# Patient Record
Sex: Male | Born: 1965
Health system: Southern US, Community
[De-identification: ages and names within clinical notes are randomized; demographics above are authoritative.]

## PROBLEM LIST (undated history)

## (undated) DIAGNOSIS — F32A Depression, unspecified: Secondary | ICD-10-CM

## (undated) DIAGNOSIS — E785 Hyperlipidemia, unspecified: Secondary | ICD-10-CM

## (undated) DIAGNOSIS — G4730 Sleep apnea, unspecified: Secondary | ICD-10-CM

## (undated) DIAGNOSIS — E559 Vitamin D deficiency, unspecified: Secondary | ICD-10-CM

## (undated) DIAGNOSIS — I10 Essential (primary) hypertension: Secondary | ICD-10-CM

## (undated) DIAGNOSIS — F1010 Alcohol abuse, uncomplicated: Secondary | ICD-10-CM

## (undated) DIAGNOSIS — S82842A Displaced bimalleolar fracture of left lower leg, initial encounter for closed fracture: Secondary | ICD-10-CM

## (undated) DIAGNOSIS — Z720 Tobacco use: Secondary | ICD-10-CM

## (undated) DIAGNOSIS — I341 Nonrheumatic mitral (valve) prolapse: Secondary | ICD-10-CM

## (undated) DIAGNOSIS — R011 Cardiac murmur, unspecified: Secondary | ICD-10-CM

## (undated) DIAGNOSIS — I340 Nonrheumatic mitral (valve) insufficiency: Secondary | ICD-10-CM

## (undated) DIAGNOSIS — F329 Major depressive disorder, single episode, unspecified: Secondary | ICD-10-CM

## (undated) DIAGNOSIS — F419 Anxiety disorder, unspecified: Secondary | ICD-10-CM

## (undated) HISTORY — DX: Alcohol abuse, uncomplicated: F10.10

## (undated) HISTORY — DX: Depression, unspecified: F32.A

## (undated) HISTORY — DX: Cardiac murmur, unspecified: R01.1

## (undated) HISTORY — DX: Nonrheumatic mitral (valve) prolapse: I34.1

## (undated) HISTORY — DX: Vitamin D deficiency, unspecified: E55.9

## (undated) HISTORY — PX: TOOTH EXTRACTION: SUR596

## (undated) HISTORY — DX: Hyperlipidemia, unspecified: E78.5

## (undated) HISTORY — DX: Major depressive disorder, single episode, unspecified: F32.9

## (undated) HISTORY — DX: Anxiety disorder, unspecified: F41.9

## (undated) HISTORY — DX: Essential (primary) hypertension: I10

## (undated) HISTORY — DX: Tobacco use: Z72.0

## (undated) HISTORY — DX: Sleep apnea, unspecified: G47.30

## (undated) HISTORY — DX: Nonrheumatic mitral (valve) insufficiency: I34.0

---

## 2007-10-23 ENCOUNTER — Ambulatory Visit: Payer: Self-pay | Admitting: Family Medicine

## 2007-10-30 ENCOUNTER — Ambulatory Visit: Payer: Self-pay | Admitting: Family Medicine

## 2009-06-04 ENCOUNTER — Ambulatory Visit: Payer: Self-pay | Admitting: Internal Medicine

## 2009-06-10 LAB — CBC WITH DIFFERENTIAL/PLATELET
BASO%: 0.4 % (ref 0.0–2.0)
EOS%: 1.3 % (ref 0.0–7.0)
MCH: 33.1 pg (ref 27.2–33.4)
MCHC: 35.2 g/dL (ref 32.0–36.0)
NEUT%: 62 % (ref 39.0–75.0)
RDW: 11.9 % (ref 11.0–14.6)
lymph#: 2.1 10*3/uL (ref 0.9–3.3)

## 2009-06-11 LAB — COMPREHENSIVE METABOLIC PANEL
ALT: 39 U/L (ref 0–53)
AST: 27 U/L (ref 0–37)
Calcium: 9.3 mg/dL (ref 8.4–10.5)
Chloride: 105 mEq/L (ref 96–112)
Creatinine, Ser: 1.17 mg/dL (ref 0.40–1.50)
Potassium: 4.1 mEq/L (ref 3.5–5.3)

## 2009-06-30 LAB — CBC WITH DIFFERENTIAL/PLATELET
BASO%: 0.3 % (ref 0.0–2.0)
EOS%: 2 % (ref 0.0–7.0)
LYMPH%: 20.5 % (ref 14.0–49.0)
MCHC: 35.4 g/dL (ref 32.0–36.0)
MCV: 94.3 fL (ref 79.3–98.0)
MONO%: 5.9 % (ref 0.0–14.0)
Platelets: 193 10*3/uL (ref 140–400)
RBC: 5.23 10*6/uL (ref 4.20–5.82)
WBC: 8.3 10*3/uL (ref 4.0–10.3)

## 2009-06-30 LAB — LACTATE DEHYDROGENASE: LDH: 153 U/L (ref 94–250)

## 2009-07-28 ENCOUNTER — Ambulatory Visit: Payer: Self-pay | Admitting: Internal Medicine

## 2009-07-29 LAB — CBC WITH DIFFERENTIAL/PLATELET
Basophils Absolute: 0 10*3/uL (ref 0.0–0.1)
EOS%: 2.9 % (ref 0.0–7.0)
HCT: 48.9 % (ref 38.4–49.9)
HGB: 17.5 g/dL — ABNORMAL HIGH (ref 13.0–17.1)
MCH: 33.2 pg (ref 27.2–33.4)
MONO#: 0.4 10*3/uL (ref 0.1–0.9)
NEUT%: 54.6 % (ref 39.0–75.0)
lymph#: 1.7 10*3/uL (ref 0.9–3.3)

## 2009-08-26 LAB — CBC WITH DIFFERENTIAL/PLATELET
Eosinophils Absolute: 0.2 10*3/uL (ref 0.0–0.5)
HCT: 48.4 % (ref 38.4–49.9)
HGB: 17.4 g/dL — ABNORMAL HIGH (ref 13.0–17.1)
LYMPH%: 24.9 % (ref 14.0–49.0)
MONO#: 0.4 10*3/uL (ref 0.1–0.9)
NEUT#: 4.2 10*3/uL (ref 1.5–6.5)
NEUT%: 66.3 % (ref 39.0–75.0)
Platelets: 184 10*3/uL (ref 140–400)
RBC: 5.21 10*6/uL (ref 4.20–5.82)
WBC: 6.4 10*3/uL (ref 4.0–10.3)

## 2009-08-26 LAB — LACTATE DEHYDROGENASE: LDH: 160 U/L (ref 94–250)

## 2009-09-22 ENCOUNTER — Ambulatory Visit: Payer: Self-pay | Admitting: Internal Medicine

## 2009-09-24 LAB — CBC WITH DIFFERENTIAL/PLATELET
Eosinophils Absolute: 0.2 10*3/uL (ref 0.0–0.5)
MONO#: 0.5 10*3/uL (ref 0.1–0.9)
NEUT#: 2.5 10*3/uL (ref 1.5–6.5)
RBC: 5.31 10*6/uL (ref 4.20–5.82)
RDW: 12.1 % (ref 11.0–14.6)
WBC: 5 10*3/uL (ref 4.0–10.3)
lymph#: 1.8 10*3/uL (ref 0.9–3.3)

## 2009-10-23 ENCOUNTER — Ambulatory Visit: Payer: Self-pay | Admitting: Internal Medicine

## 2009-10-27 LAB — CBC WITH DIFFERENTIAL/PLATELET
EOS%: 4.5 % (ref 0.0–7.0)
MCH: 32.2 pg (ref 27.2–33.4)
MCV: 91.6 fL (ref 79.3–98.0)
MONO%: 8.8 % (ref 0.0–14.0)
NEUT#: 2.1 10*3/uL (ref 1.5–6.5)
RBC: 5.15 10*6/uL (ref 4.20–5.82)
RDW: 11.9 % (ref 11.0–14.6)
lymph#: 1.8 10*3/uL (ref 0.9–3.3)

## 2009-11-26 ENCOUNTER — Ambulatory Visit: Payer: Self-pay | Admitting: Internal Medicine

## 2009-12-01 LAB — CBC WITH DIFFERENTIAL/PLATELET
BASO%: 0.3 % (ref 0.0–2.0)
Basophils Absolute: 0 10*3/uL (ref 0.0–0.1)
EOS%: 2.4 % (ref 0.0–7.0)
HGB: 16.1 g/dL (ref 13.0–17.1)
MCH: 31 pg (ref 27.2–33.4)
MCHC: 34.2 g/dL (ref 32.0–36.0)
MCV: 90.7 fL (ref 79.3–98.0)
MONO%: 4.1 % (ref 0.0–14.0)
NEUT%: 68.2 % (ref 39.0–75.0)
RDW: 12.1 % (ref 11.0–14.6)

## 2010-01-29 ENCOUNTER — Ambulatory Visit: Payer: Self-pay | Admitting: Internal Medicine

## 2010-02-02 LAB — CBC WITH DIFFERENTIAL/PLATELET
BASO%: 0.6 % (ref 0.0–2.0)
Basophils Absolute: 0 10*3/uL (ref 0.0–0.1)
Eosinophils Absolute: 0.2 10*3/uL (ref 0.0–0.5)
HCT: 42 % (ref 38.4–49.9)
HGB: 14.6 g/dL (ref 13.0–17.1)
LYMPH%: 33.5 % (ref 14.0–49.0)
MCHC: 34.8 g/dL (ref 32.0–36.0)
MONO#: 0.4 10*3/uL (ref 0.1–0.9)
NEUT%: 54.2 % (ref 39.0–75.0)
Platelets: 208 10*3/uL (ref 140–400)
WBC: 4.9 10*3/uL (ref 4.0–10.3)

## 2010-05-05 ENCOUNTER — Other Ambulatory Visit: Payer: Self-pay | Admitting: Internal Medicine

## 2010-05-05 ENCOUNTER — Encounter (HOSPITAL_BASED_OUTPATIENT_CLINIC_OR_DEPARTMENT_OTHER): Payer: MEDICARE | Admitting: Internal Medicine

## 2010-05-05 DIAGNOSIS — D751 Secondary polycythemia: Secondary | ICD-10-CM

## 2010-05-05 LAB — CBC WITH DIFFERENTIAL/PLATELET
BASO%: 0.7 % (ref 0.0–2.0)
EOS%: 3.5 % (ref 0.0–7.0)
MCH: 30.4 pg (ref 27.2–33.4)
MCHC: 33.9 g/dL (ref 32.0–36.0)
MONO#: 0.4 10*3/uL (ref 0.1–0.9)
RBC: 5 10*6/uL (ref 4.20–5.82)
RDW: 13.1 % (ref 11.0–14.6)
WBC: 4.7 10*3/uL (ref 4.0–10.3)
lymph#: 1.8 10*3/uL (ref 0.9–3.3)

## 2010-05-05 LAB — COMPREHENSIVE METABOLIC PANEL
ALT: 37 U/L (ref 0–53)
AST: 26 U/L (ref 0–37)
CO2: 26 mEq/L (ref 19–32)
Calcium: 9.5 mg/dL (ref 8.4–10.5)
Chloride: 107 mEq/L (ref 96–112)
Creatinine, Ser: 1.03 mg/dL (ref 0.40–1.50)
Potassium: 4.3 mEq/L (ref 3.5–5.3)
Sodium: 142 mEq/L (ref 135–145)
Total Protein: 6.7 g/dL (ref 6.0–8.3)

## 2010-05-05 LAB — LACTATE DEHYDROGENASE: LDH: 145 U/L (ref 94–250)

## 2012-10-22 ENCOUNTER — Telehealth: Payer: Self-pay | Admitting: Family Medicine

## 2012-10-22 NOTE — Telephone Encounter (Signed)
PT INFORMED

## 2012-10-22 NOTE — Telephone Encounter (Signed)
PT CALLED STATING HE WAS A FORMER PT OF YOURS. FROM WHAT I CAN TELL IT HAS BEEN SINCE BEFORE 2009 SINCE WE HAVE SEEN HIM. HE WOULD LIKE TO RETURN HE NOW HAS MEDICARE [employer] INS.    PLEASE ADVISE.

## 2012-10-22 NOTE — Telephone Encounter (Signed)
Tell him that we are not accepting any new Medicare patients

## 2012-10-29 ENCOUNTER — Ambulatory Visit
Admission: RE | Admit: 2012-10-29 | Discharge: 2012-10-29 | Disposition: A | Discharge status: Home / Self Care | Payer: Medicare Other | Source: Ambulatory Visit | Attending: Family Medicine | Admitting: Family Medicine

## 2012-10-29 ENCOUNTER — Other Ambulatory Visit: Payer: Self-pay | Admitting: Family Medicine

## 2012-10-29 DIAGNOSIS — R202 Paresthesia of skin: Secondary | ICD-10-CM

## 2012-10-29 DIAGNOSIS — R05 Cough: Secondary | ICD-10-CM

## 2012-11-13 ENCOUNTER — Encounter: Payer: Self-pay | Admitting: Pulmonary Disease

## 2012-11-13 ENCOUNTER — Ambulatory Visit (INDEPENDENT_AMBULATORY_CARE_PROVIDER_SITE_OTHER): Payer: Medicare Other | Admitting: Pulmonary Disease

## 2012-11-13 VITALS — BP 132/90 | HR 87 | Temp 97.1°F | Ht 75.0 in | Wt 206.0 lb

## 2012-11-13 DIAGNOSIS — G4733 Obstructive sleep apnea (adult) (pediatric): Secondary | ICD-10-CM | POA: Insufficient documentation

## 2012-11-13 NOTE — Progress Notes (Signed)
Subjective:    Patient ID: Jonathon Walker, male    DOB: 1965-09-18, 47 y.o.   MRN: [id number]  HPI 47 year old smoker presents for evaluation of obstructive sleep apnea.  He underwent a diagnostic polysomnogram in 07/2006 which showed an AHI of 26 events per hour, REM-related AHI of 20 events per hour with the lowest desaturation of 89%. Supine sleep was noted for 30 minutes with an AHI of 8 events per hour. A subsequent CPAP titration study required a CPAP pressure of 17 cm with C-Flex of 3 to control events. He could not tolerate this level of pressure and seems like he was set up with an auto BiPAP with a fullface mask by [facility]. They seem to work well for a few years. Over the past few months her he reports numerous awakenings and feels like the BiPAP is not ramping up on the pressure in always stays at a level of 6 cm. He reports excessive daytime tiredness and frequent nocturnal awakenings. Epworth sleepiness score is 5/24. Bedtime is around 11 PM, sleep latency is about 30 minutes, he sleeps on his back with one pillow. He reports numerous spontaneous awakenings and is out of bed at 8 AM feeling tired without dryness of mouth or headaches. His weight has not changed in recent years. 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 There is no history suggestive of cataplexy, sleep paralysis or parasomnias   Past Medical History  Diagnosis Date  . Sleep apnea   . Depression   . Anxiety     Past Surgical History  Procedure Laterality Date  . Tooth extraction      No Known Allergies  History   Social History  . Marital Status: Married    Spouse Name: N/A    Number of Children: N/A  . Years of Education: N/A   Occupational History  . Not on file.   Social History Main Topics  . Smoking status: Current Every Day Smoker -- 1.00 packs/day for 3 years    Types: Cigarettes  . Smokeless tobacco: Never Used  . Alcohol Use: Yes  .  Drug Use: No  . Sexual Activity: Not on file   Other Topics Concern  . Not on file   Social History Narrative  . No narrative on file    Family History  Problem Relation Age of Onset  . Cancer Maternal Grandfather       Review of Systems  Constitutional: Negative for fever, chills, diaphoresis, activity change, appetite change, fatigue and unexpected weight change.  HENT: Negative for hearing loss, ear pain, nosebleeds, congestion, sore throat, facial swelling, rhinorrhea, sneezing, mouth sores, trouble swallowing, neck pain, neck stiffness, dental problem, voice change, postnasal drip, sinus pressure, tinnitus and ear discharge.   Eyes: Negative for photophobia, discharge, itching and visual disturbance.  Respiratory: Negative for apnea, cough, choking, chest tightness, shortness of breath, wheezing and stridor.   Cardiovascular: Negative for chest pain, palpitations and leg swelling.  Gastrointestinal: Negative for nausea, vomiting, abdominal pain, constipation, blood in stool and abdominal distention.  Genitourinary: Negative for dysuria, urgency, frequency, hematuria, flank pain, decreased urine volume and difficulty urinating.  Musculoskeletal: Negative for myalgias, back pain, joint swelling, arthralgias and gait problem.  Skin: Negative for color change, pallor and rash.  Neurological: Negative for dizziness, tremors, seizures, syncope, speech difficulty, weakness, light-headedness, numbness and headaches.  Hematological: Negative for adenopathy. Does not bruise/bleed easily.  Psychiatric/Behavioral: Positive for sleep disturbance. Negative for  confusion and agitation. The patient is not nervous/anxious.        Objective:   Physical Exam  Gen. Pleasant, well-nourished, in no distress, normal affect ENT - no lesions, no post nasal drip Neck: No JVD, no thyromegaly, no carotid bruits Lungs: no use of accessory muscles, no dullness to percussion, clear without rales or  rhonchi  Cardiovascular: Rhythm regular, heart sounds  normal, no murmurs or gallops, no peripheral edema Abdomen: soft and non-tender, no hepatosplenomegaly, BS normal. Musculoskeletal: No deformities, no cyanosis or clubbing Neuro:  alert, non focal       Assessment & Plan:

## 2012-11-13 NOTE — Patient Instructions (Addendum)
Current DME - [facility] We will have DMe check out your bipap machine & get [place] download report Based on this ,we will decide about need for repeat bipap titration sleep study

## 2012-11-13 NOTE — Assessment & Plan Note (Addendum)
Moderate, AHI 26/h in 07/2006 corrected by CPAP 17 - maintained on auto bipap Current DME - [facility] We will have DMe check out your bipap machine & get [place] download report Based on this ,we will decide about need for repeat bipap titration sleep study  Weight loss encouraged, compliance with goal of at least 4-6 hrs every night is the expectation. Advised against medications with sedative side effects Cautioned against driving when sleepy - understanding that sleepiness will vary on a day to day basis

## 2012-11-16 ENCOUNTER — Institutional Professional Consult (permissible substitution): Payer: Self-pay | Admitting: Pulmonary Disease

## 2012-11-23 ENCOUNTER — Telehealth: Payer: Self-pay | Admitting: Pulmonary Disease

## 2012-11-23 DIAGNOSIS — G4733 Obstructive sleep apnea (adult) (pediatric): Secondary | ICD-10-CM

## 2012-11-23 NOTE — Telephone Encounter (Signed)
Spoke with Beth at [facility] and she states that download shows that pt's bipap machine is not working properly.  Pt got his machine in 2008 and is eligible for new machine.  Do you want to order new machine for pt?  If so order will need to go to another company [facility]) due to pt having [employer].  Please advise.

## 2012-11-23 NOTE — Telephone Encounter (Signed)
Dr Vassie Loll please advise. Patient needing new CPAP ASAP D/L and e-mail in your look at.

## 2012-11-26 NOTE — Telephone Encounter (Signed)
Reviewed bipap download - avg pr 11/8, with [facility] 6/h, malfunctioning machine OK to send order to [facility] for new Cpap 11cm with EPR 3 cm, humidity , download in 1 month

## 2012-11-26 NOTE — Telephone Encounter (Signed)
I have placed order and sent message to [facility] making her aware. Nothing further needed

## 2012-11-28 ENCOUNTER — Telehealth: Payer: Self-pay | Admitting: *Deleted

## 2012-11-28 NOTE — Telephone Encounter (Signed)
i did place order on 11/26/12 and sent Melissa a staff message letting her know. I called and made Rhonda aware. Will forward message to her

## 2012-11-28 NOTE — Telephone Encounter (Signed)
Message copied by Tommie Sams on Wed Nov 28, 2012 12:27 PM ------      Message from: Alfonso Ramus J      Created: Wed Nov 28, 2012 11:29 AM      Regarding: RE: download from [facility]        When you have time, please come see me about this patient.      Thanks,      Bjorn Loser      ----- Message -----         From: Tommie Sams, CMA         Sent: 11/27/2012   5:23 PM           To: Ollen Gross      Subject: RE: download from [facility] already has this order. thanks      ----- Message -----         From: Ollen Gross         Sent: 11/27/2012   5:12 PM           To: Tommie Sams, CMA      Subject: download from [facility]                                        Pt was in on 11/13/12-Order sent to [facility] to obtain download and check pt's machine.      Download was placed in Dr. Reginia Naas look at stack, which indicated that according to download showed large amounts of days the machine was not coming on. [facility] no longer accepts patient's insurance, therefore we will need a new order.      Pt needs a new DME to supply a replacement and patient wants to go to [facility].       I will need a new dme order for a new machine to go to [facility].      Please advise.      Thanks,      Bjorn Loser             ------

## 2012-11-28 NOTE — Telephone Encounter (Signed)
Pl see my last phone note -start CPAP 11 cm -EPR 3

## 2012-11-28 NOTE — Telephone Encounter (Signed)
Download has been giving to Dr. Vassie Loll to advise what setting his bipap needs to be on or if he needs auto bipap. I will then send order to get pt new machine with setting. Please advise Dr. Vassie Loll thanks

## 2012-11-30 ENCOUNTER — Telehealth: Payer: Self-pay | Admitting: Pulmonary Disease

## 2012-11-30 ENCOUNTER — Institutional Professional Consult (permissible substitution): Payer: Self-pay | Admitting: Pulmonary Disease

## 2012-11-30 NOTE — Telephone Encounter (Signed)
Spoke with Jonathon Walker She has the order and will have her respiratory dept call pt's Mother with update  Will close encounter

## 2012-11-30 NOTE — Telephone Encounter (Signed)
Jonathon Walker returned call . [name]

## 2012-11-30 NOTE — Telephone Encounter (Signed)
Spoke with the pt's mother She states that they never heard back on CPAP download  I advised that according to our records, we sent order to [facility] for CPAP setting of 11 on 11/28/12 I advised Mother will call and ask Efraim Kaufmann about this  Called and spoke with [facility] and she will check on this and call us back

## 2012-12-07 ENCOUNTER — Telehealth: Payer: Self-pay | Admitting: Pulmonary Disease

## 2012-12-07 NOTE — Telephone Encounter (Signed)
Spoke with patients mother, states she or patient have not heard anything from [facility] at this time Informed mother that we have indeed faxed orders over  Spoke with [facility]-- she has received orders, states patient has BiPap so they are needing orders for Bipap orders  Per phone note: Oretha Milch, MD at 11/28/2012  3:18 PM    Status: Signed                   Pl see my last phone note -start CPAP 11 cm -EPR 3   ---------------------------- Patient is to start CPAP at above settings Made Melissa aware of this, she verbalized understanding and stated they will get in contact with patient/patients mother Monday  Spoke back with patients mother and made her aware of above,  She verbalized understanding nothing further needed at this time Advised mother that if they had not heartd anything by beginning of next week to please call our office

## 2014-01-06 IMAGING — CR DG CERVICAL SPINE COMPLETE 4+V
5 series · 5 of 5 positions shown · non-contrast
Comparison: None.

CLINICAL DATA: Tingling and numbness in the hands and feet
bilaterally.

CERVICAL SPINE - COMPLETE 4+ VIEW

[view not recorded (1 of 5)]
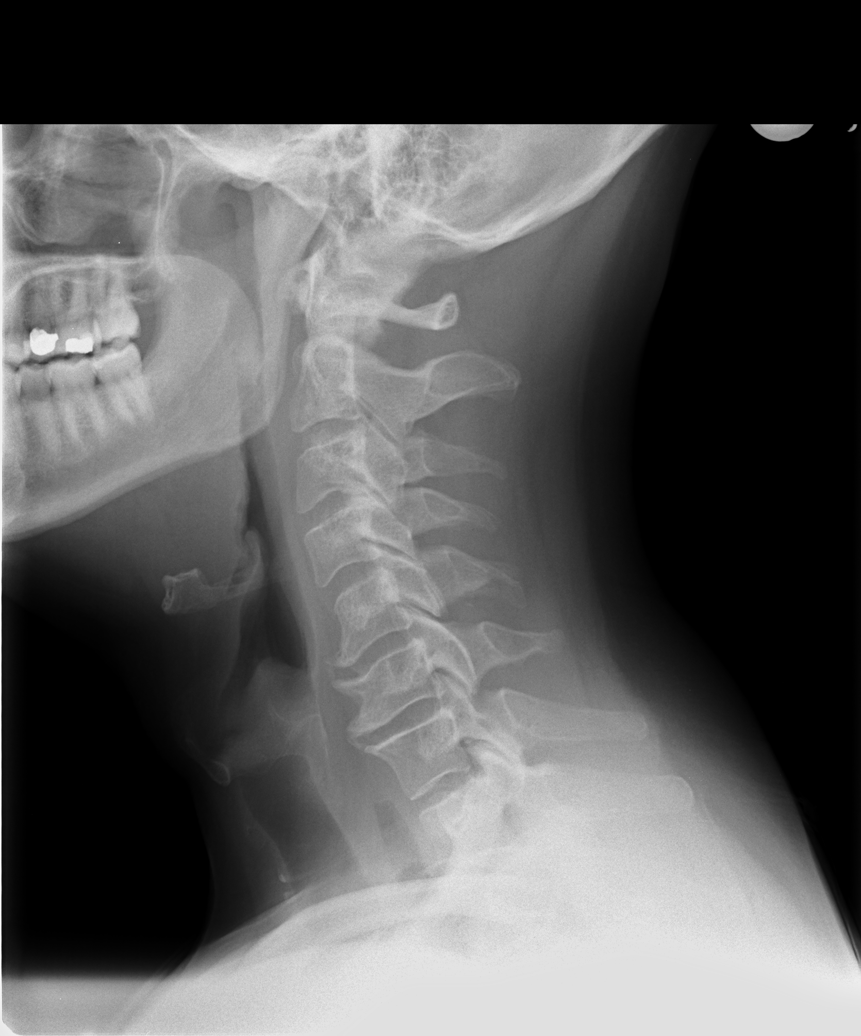

[view not recorded (2 of 5)]
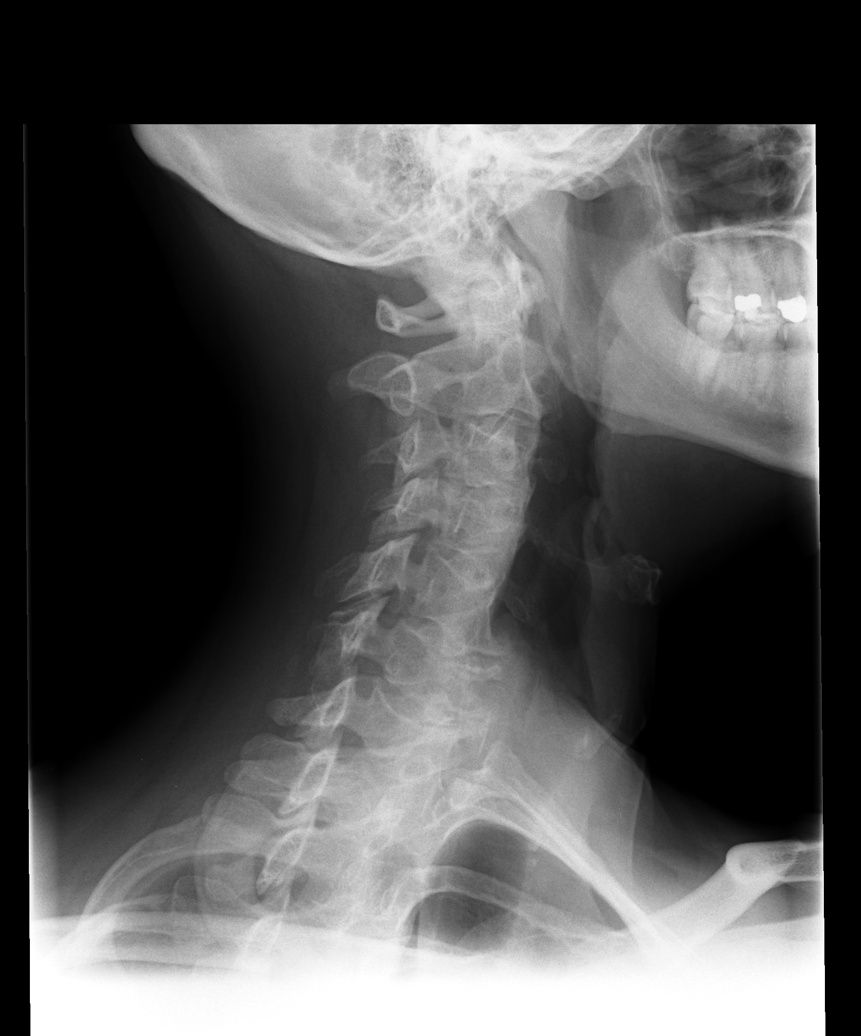

[view not recorded (3 of 5)]
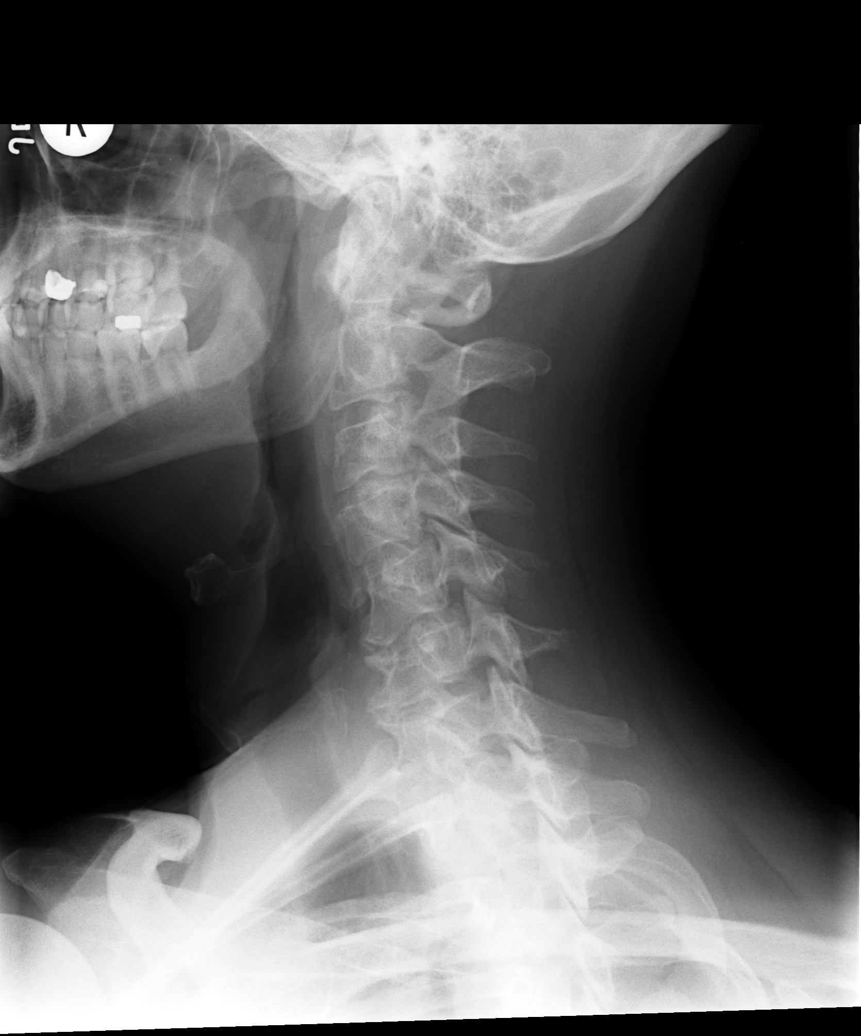

[view not recorded (4 of 5)]
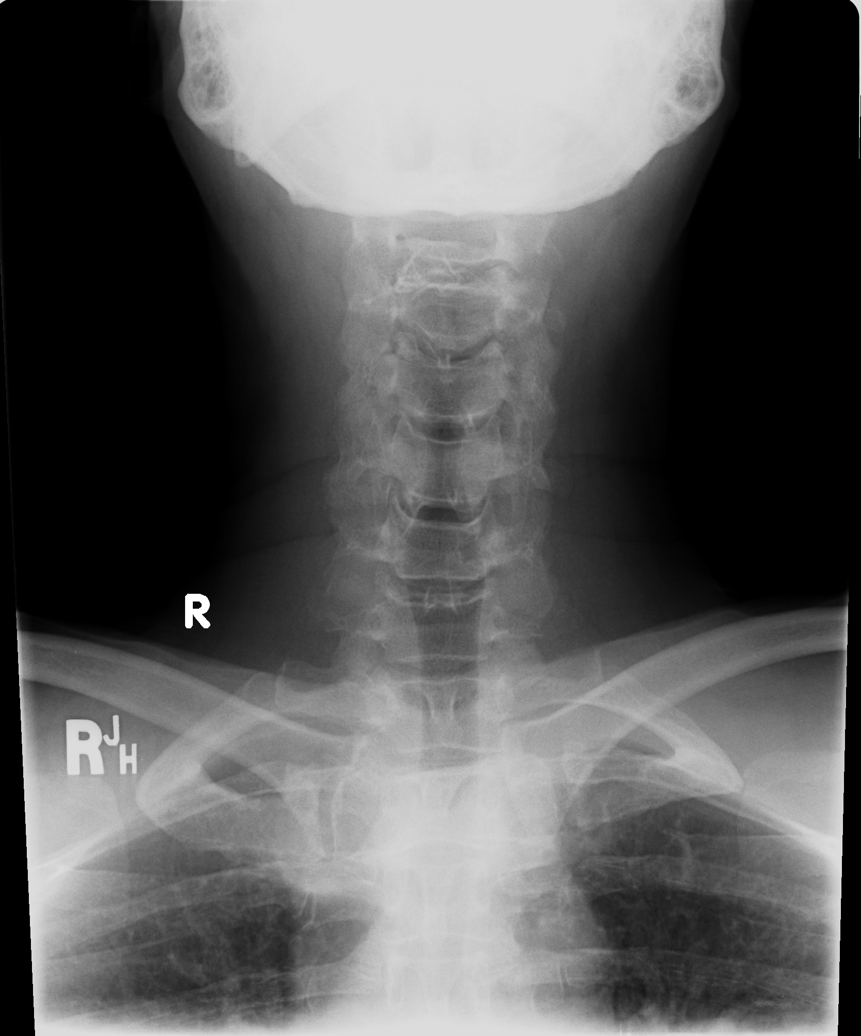

[view not recorded (5 of 5)]
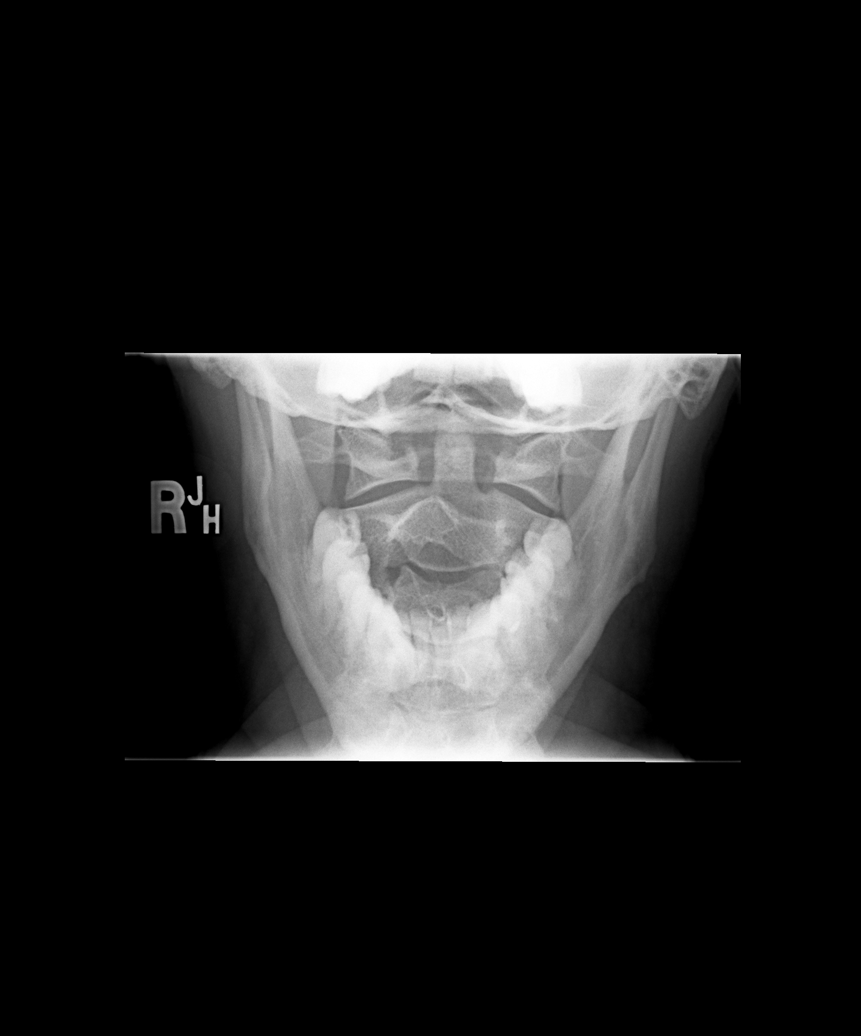

[5 of 5 positions shown; findings below may reference images not displayed]

FINDINGS: The cervical spine is visualized from the occiput to the
cervicothoracic junction.  Alignment is anatomic.  Vertebral body
and disc space height are maintained.  Prevertebral soft tissues
are within normal limits.

Mild multilevel uncovertebral hypertrophy and facet sclerosis.
Anterior marginal osteophytosis is worst at C5-6 and C6-7.
Probable right-sided neural foraminal narrowing at C3-4.  Left
neural foramina are poorly visualized due to over rotation.
Visualized lung apices show no acute findings.
IMPRESSION: Mild spondylosis.  No acute findings.

## 2014-01-06 IMAGING — CR DG CHEST 2V
3 series · 3 of 3 positions shown · non-contrast
Comparison: None.

CLINICAL DATA: Cough, smoker.

CHEST - 2 VIEW

[view not recorded (1 of 3)]
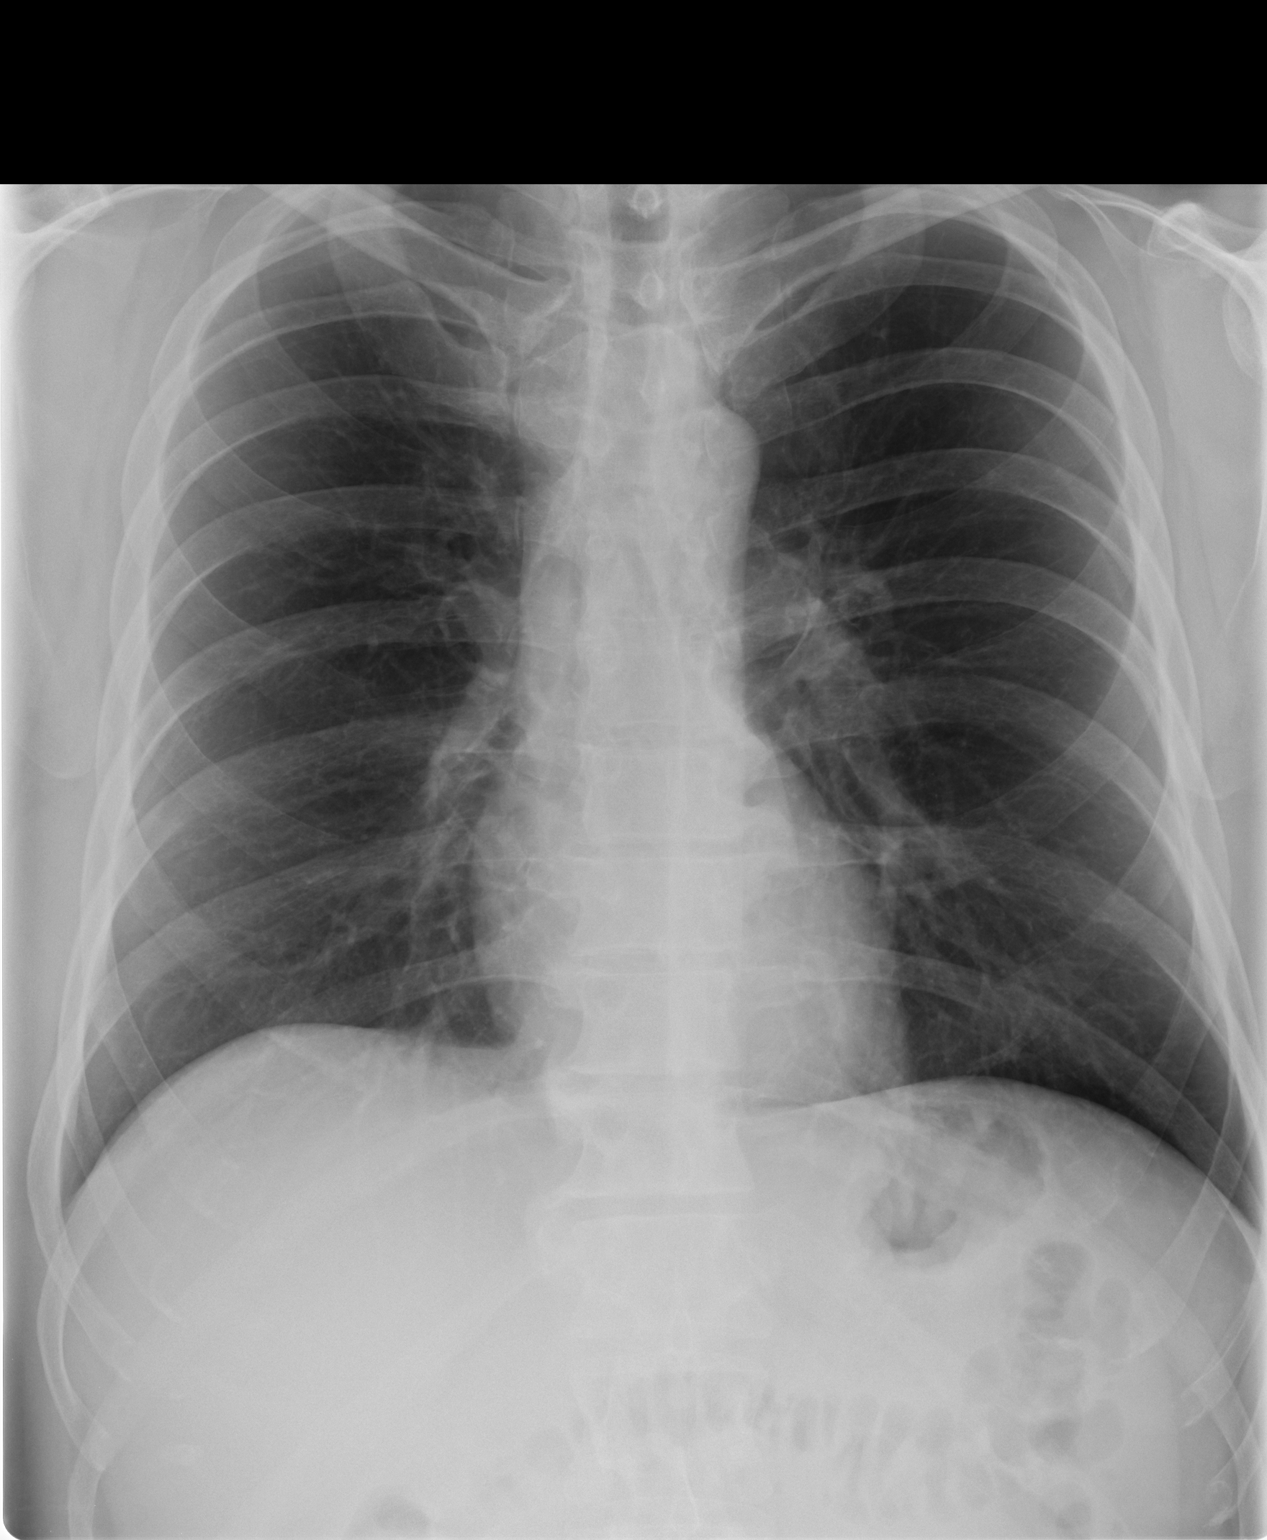

[view not recorded (2 of 3)]
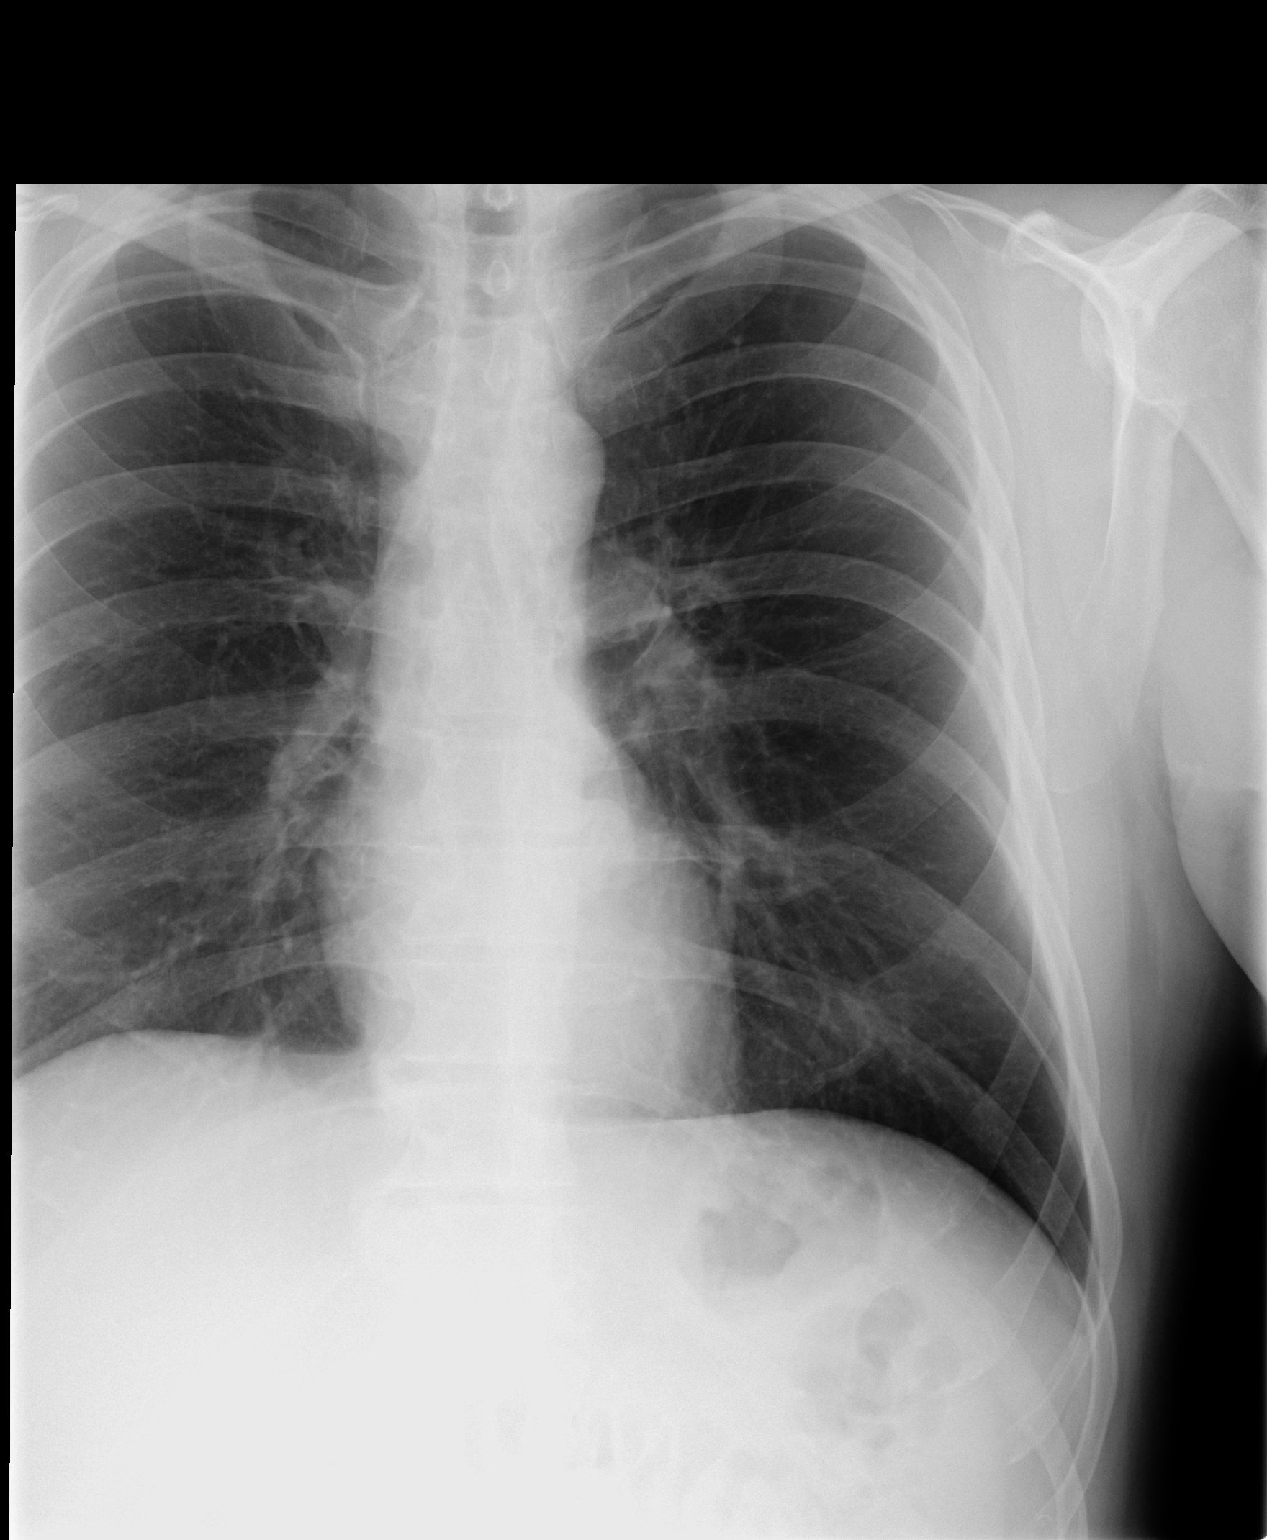

[view not recorded (3 of 3)]
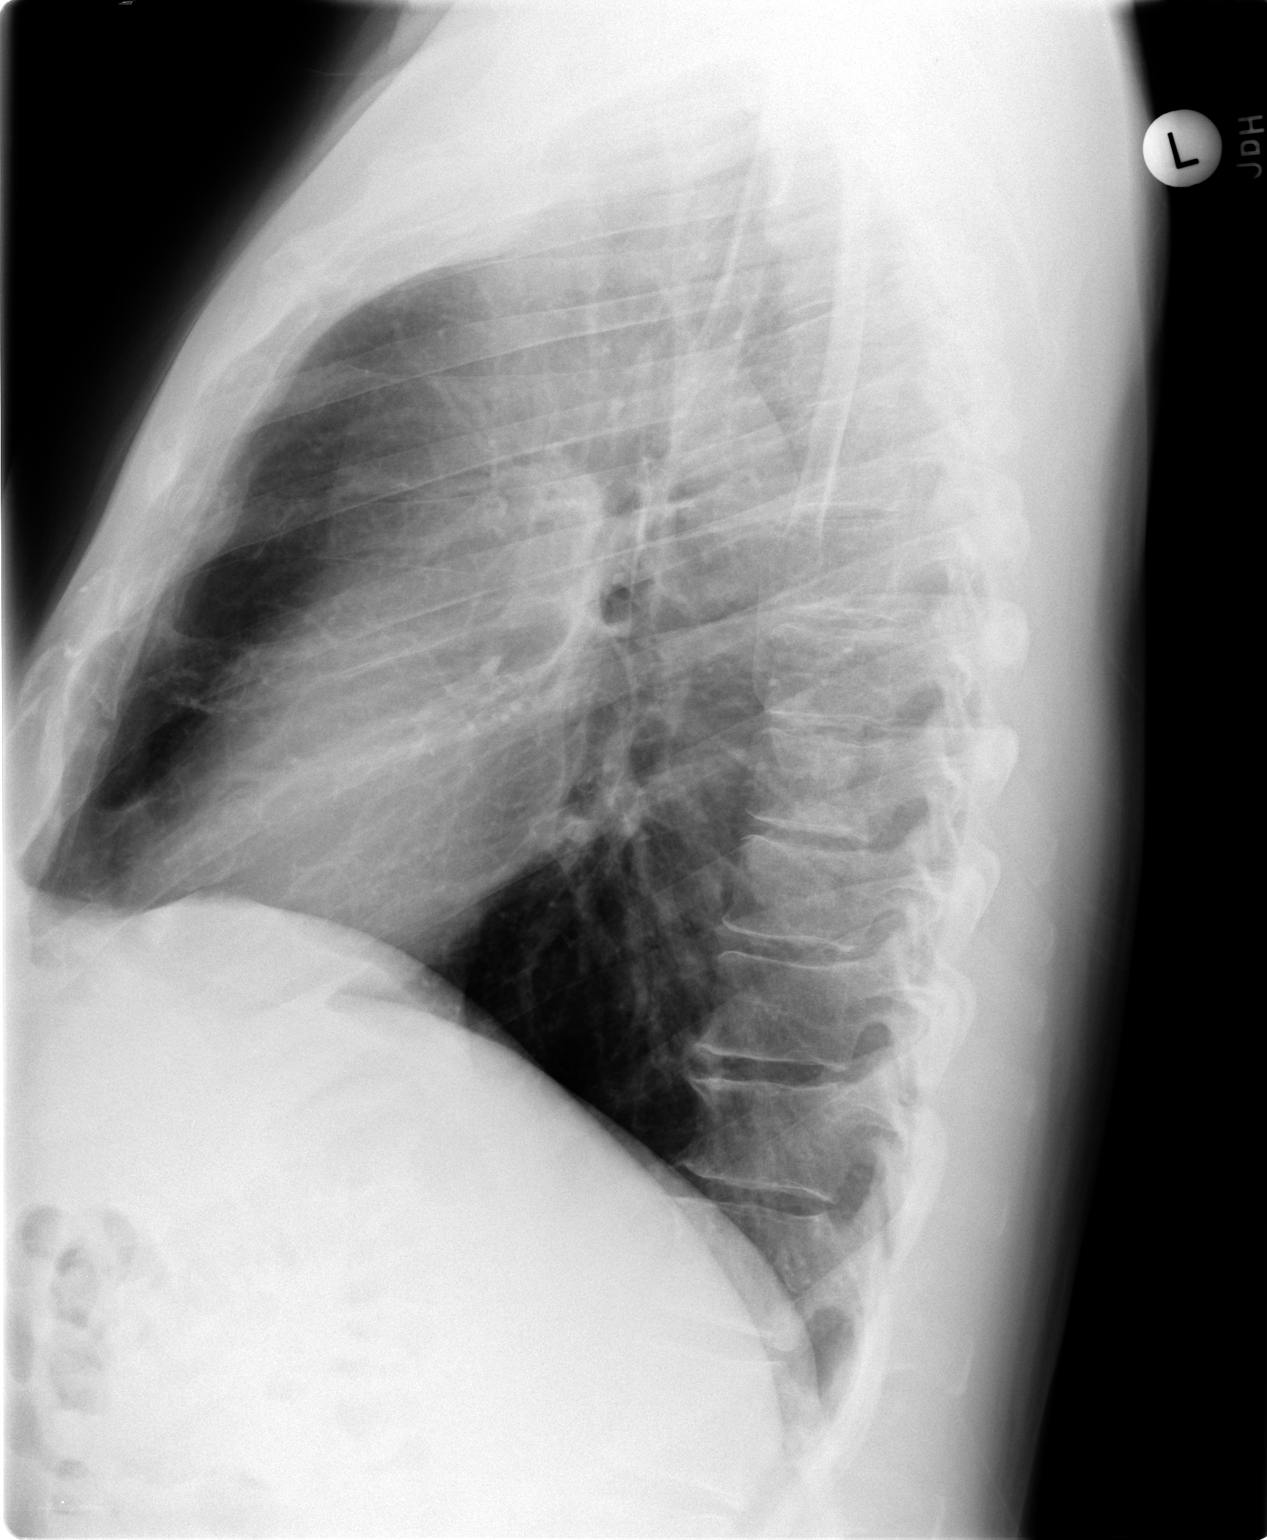

[3 of 3 positions shown; findings below may reference images not displayed]

FINDINGS: Trachea is midline.  Heart size normal.  Lungs are clear
but hyperinflated.  No pleural fluid.
IMPRESSION: Hyperinflation without acute finding.

## 2014-09-30 DIAGNOSIS — F329 Major depressive disorder, single episode, unspecified: Secondary | ICD-10-CM | POA: Diagnosis not present

## 2014-09-30 DIAGNOSIS — D751 Secondary polycythemia: Secondary | ICD-10-CM | POA: Diagnosis not present

## 2014-09-30 DIAGNOSIS — E538 Deficiency of other specified B group vitamins: Secondary | ICD-10-CM | POA: Diagnosis not present

## 2014-09-30 DIAGNOSIS — Z79899 Other long term (current) drug therapy: Secondary | ICD-10-CM | POA: Diagnosis not present

## 2014-09-30 DIAGNOSIS — I10 Essential (primary) hypertension: Secondary | ICD-10-CM | POA: Diagnosis not present

## 2017-05-26 DIAGNOSIS — G4733 Obstructive sleep apnea (adult) (pediatric): Secondary | ICD-10-CM | POA: Diagnosis not present

## 2017-09-12 DIAGNOSIS — S8265XA Nondisplaced fracture of lateral malleolus of left fibula, initial encounter for closed fracture: Secondary | ICD-10-CM | POA: Diagnosis not present

## 2017-09-12 DIAGNOSIS — I10 Essential (primary) hypertension: Secondary | ICD-10-CM | POA: Diagnosis not present

## 2017-09-12 DIAGNOSIS — M25572 Pain in left ankle and joints of left foot: Secondary | ICD-10-CM | POA: Diagnosis not present

## 2017-09-12 DIAGNOSIS — S8262XA Displaced fracture of lateral malleolus of left fibula, initial encounter for closed fracture: Secondary | ICD-10-CM | POA: Diagnosis not present

## 2017-09-13 ENCOUNTER — Encounter (HOSPITAL_BASED_OUTPATIENT_CLINIC_OR_DEPARTMENT_OTHER): Payer: Self-pay | Admitting: *Deleted

## 2017-09-13 ENCOUNTER — Other Ambulatory Visit: Payer: Self-pay

## 2017-09-13 DIAGNOSIS — S8265XD Nondisplaced fracture of lateral malleolus of left fibula, subsequent encounter for closed fracture with routine healing: Secondary | ICD-10-CM | POA: Diagnosis not present

## 2017-09-18 NOTE — Anesthesia Preprocedure Evaluation (Addendum)
Anesthesia Evaluation  Patient identified by MRN, date of birth, ID band Patient awake    Reviewed: Allergy & Precautions, NPO status , Patient's Chart, lab work & pertinent test results  Airway Mallampati: II  TM Distance: >3 FB Neck ROM: Full    Dental no notable dental hx.    Pulmonary sleep apnea , Current Smoker,    Pulmonary exam normal breath sounds clear to auscultation       Cardiovascular negative cardio ROS Normal cardiovascular exam Rhythm:Regular Rate:Normal     Neuro/Psych PSYCHIATRIC DISORDERS Anxiety Depression negative neurological ROS     GI/Hepatic negative GI ROS, Neg liver ROS,   Endo/Other  negative endocrine ROS  Renal/GU negative Renal ROS     Musculoskeletal negative musculoskeletal ROS (+)   Abdominal (+) + obese,   Peds  Hematology negative hematology ROS (+)   Anesthesia Other Findings   Reproductive/Obstetrics                            Anesthesia Physical Anesthesia Plan  ASA: II  Anesthesia Plan: General   Post-op Pain Management: GA combined w/ Regional for post-op pain   Induction:   PONV Risk Score and Plan: 0 and Ondansetron and Propofol infusion  Airway Management Planned: LMA  Additional Equipment:   Intra-op Plan:   Post-operative Plan: Extubation in OR  Informed Consent: I have reviewed the patients History and Physical, chart, labs and discussed the procedure including the risks, benefits and alternatives for the proposed anesthesia with the patient or authorized representative who has indicated his/her understanding and acceptance.   Dental advisory given  Plan Discussed with: CRNA  Anesthesia Plan Comments:        Anesthesia Quick Evaluation

## 2017-09-18 NOTE — H&P (Signed)
[facility]  [address] [place], [place] [address] [phone]   A Division of [facility]  RE: Unknown, [place]   [id number]      DOB: 1965-07-04  09-13-17  REASON FOR VISIT: Referral from Dr. Alfonso Ramus for a left Weber C ankle fracture on 09-11-17.   HPI:  His pain is well controlled. He has been elevating it.  EXAMINATION: Well appearing male in no apparent distress. Neurovascularly intact to the toes.   IMAGES: X-rays reviewed by me:   X-rays showed a Weber C ankle fracture with mild displacement.  ASSESSMENT & PLAN: Weber C bimalleolar equivalent with syndesmosis injury. We discussed the risks and benefits of open reduction and internal fixation of his bimalleolar equivalent and syndesmosis. He will elevate it until then. We will provide him a knee walker.    Ernesta Amble.  Percell Miller, M.D.  Electronically verified by Ernesta Amble. Percell Miller, M.D. TDMDelorise Royals D:  09-13-17 T:  09-15-17

## 2017-09-19 ENCOUNTER — Ambulatory Visit (HOSPITAL_BASED_OUTPATIENT_CLINIC_OR_DEPARTMENT_OTHER): Payer: Medicare Other | Admitting: Anesthesiology

## 2017-09-19 ENCOUNTER — Encounter (HOSPITAL_BASED_OUTPATIENT_CLINIC_OR_DEPARTMENT_OTHER): Payer: Self-pay | Admitting: Anesthesiology

## 2017-09-19 ENCOUNTER — Other Ambulatory Visit: Payer: Self-pay

## 2017-09-19 ENCOUNTER — Ambulatory Visit (HOSPITAL_BASED_OUTPATIENT_CLINIC_OR_DEPARTMENT_OTHER)
Admission: RE | Admit: 2017-09-19 | Discharge: 2017-09-19 | Disposition: A | Discharge status: Home / Self Care | Payer: Medicare Other | Source: Ambulatory Visit | Attending: Orthopedic Surgery | Admitting: Orthopedic Surgery

## 2017-09-19 ENCOUNTER — Encounter (HOSPITAL_BASED_OUTPATIENT_CLINIC_OR_DEPARTMENT_OTHER)
Admission: RE | Disposition: A | Discharge status: Home / Self Care | Payer: Self-pay | Source: Ambulatory Visit | Attending: Orthopedic Surgery

## 2017-09-19 DIAGNOSIS — F17200 Nicotine dependence, unspecified, uncomplicated: Secondary | ICD-10-CM | POA: Insufficient documentation

## 2017-09-19 DIAGNOSIS — S82842A Displaced bimalleolar fracture of left lower leg, initial encounter for closed fracture: Secondary | ICD-10-CM | POA: Diagnosis not present

## 2017-09-19 DIAGNOSIS — G4733 Obstructive sleep apnea (adult) (pediatric): Secondary | ICD-10-CM | POA: Diagnosis not present

## 2017-09-19 DIAGNOSIS — G4730 Sleep apnea, unspecified: Secondary | ICD-10-CM | POA: Diagnosis not present

## 2017-09-19 DIAGNOSIS — X58XXXA Exposure to other specified factors, initial encounter: Secondary | ICD-10-CM | POA: Insufficient documentation

## 2017-09-19 DIAGNOSIS — Z79899 Other long term (current) drug therapy: Secondary | ICD-10-CM | POA: Diagnosis not present

## 2017-09-19 DIAGNOSIS — Y929 Unspecified place or not applicable: Secondary | ICD-10-CM | POA: Insufficient documentation

## 2017-09-19 DIAGNOSIS — F418 Other specified anxiety disorders: Secondary | ICD-10-CM | POA: Diagnosis not present

## 2017-09-19 DIAGNOSIS — S82892A Other fracture of left lower leg, initial encounter for closed fracture: Secondary | ICD-10-CM

## 2017-09-19 DIAGNOSIS — G8918 Other acute postprocedural pain: Secondary | ICD-10-CM | POA: Diagnosis not present

## 2017-09-19 HISTORY — DX: Displaced bimalleolar fracture of left lower leg, initial encounter for closed fracture: S82.842A

## 2017-09-19 HISTORY — PX: ORIF ANKLE FRACTURE: SHX5408

## 2017-09-19 SURGERY — OPEN REDUCTION INTERNAL FIXATION (ORIF) ANKLE FRACTURE
Anesthesia: General | Site: Ankle | Laterality: Left

## 2017-09-19 MED ORDER — PROPOFOL 500 MG/50ML IV EMUL
INTRAVENOUS | Status: AC
  Filled 2017-09-19: qty 50

## 2017-09-19 MED ORDER — DEXAMETHASONE SODIUM PHOSPHATE 10 MG/ML IJ SOLN
INTRAMUSCULAR | Status: DC | PRN
  Administered 2017-09-19: 10 mg via INTRAVENOUS

## 2017-09-19 MED ORDER — CHLORHEXIDINE GLUCONATE 4 % EX LIQD: 60.0000 mL | Freq: Once | CUTANEOUS | Status: DC

## 2017-09-19 MED ORDER — MEPERIDINE HCL 25 MG/ML IJ SOLN: 6.2500 mg | INTRAMUSCULAR | Status: DC | PRN

## 2017-09-19 MED ORDER — LACTATED RINGERS IV SOLN
INTRAVENOUS | Status: DC
  Administered 2017-09-19 (×2): via INTRAVENOUS

## 2017-09-19 MED ORDER — LIDOCAINE HCL (CARDIAC) PF 100 MG/5ML IV SOSY
PREFILLED_SYRINGE | INTRAVENOUS | Status: DC | PRN
  Administered 2017-09-19: 100 mg via INTRAVENOUS

## 2017-09-19 MED ORDER — METHOCARBAMOL 750 MG PO TABS: 750.0000 mg | ORAL_TABLET | Freq: Four times a day (QID) | ORAL | 0 refills | Status: DC | PRN

## 2017-09-19 MED ORDER — ONDANSETRON HCL 4 MG PO TABS: 4.0000 mg | ORAL_TABLET | Freq: Three times a day (TID) | ORAL | 0 refills | Status: DC | PRN

## 2017-09-19 MED ORDER — GABAPENTIN 300 MG PO CAPS
300.0000 mg | ORAL_CAPSULE | Freq: Once | ORAL | Status: AC
  Administered 2017-09-19: 300 mg via ORAL

## 2017-09-19 MED ORDER — ONDANSETRON HCL 4 MG/2ML IJ SOLN
INTRAMUSCULAR | Status: DC | PRN
  Administered 2017-09-19: 4 mg via INTRAVENOUS

## 2017-09-19 MED ORDER — FENTANYL CITRATE (PF) 100 MCG/2ML IJ SOLN
INTRAMUSCULAR | Status: AC
  Filled 2017-09-19: qty 2

## 2017-09-19 MED ORDER — SCOPOLAMINE 1 MG/3DAYS TD PT72: 1.0000 | MEDICATED_PATCH | Freq: Once | TRANSDERMAL | Status: DC | PRN

## 2017-09-19 MED ORDER — ROPIVACAINE HCL 7.5 MG/ML IJ SOLN
INTRAMUSCULAR | Status: DC | PRN
  Administered 2017-09-19: 40 mL via PERINEURAL

## 2017-09-19 MED ORDER — MIDAZOLAM HCL 2 MG/2ML IJ SOLN
1.0000 mg | INTRAMUSCULAR | Status: DC | PRN
  Administered 2017-09-19: 2 mg via INTRAVENOUS

## 2017-09-19 MED ORDER — CEFAZOLIN SODIUM-DEXTROSE 2-4 GM/100ML-% IV SOLN
2.0000 g | INTRAVENOUS | Status: AC
  Administered 2017-09-19: 2 g via INTRAVENOUS

## 2017-09-19 MED ORDER — 0.9 % SODIUM CHLORIDE (POUR BTL) OPTIME
TOPICAL | Status: DC | PRN
  Administered 2017-09-19: 200 mL

## 2017-09-19 MED ORDER — ASPIRIN EC 81 MG PO TBEC: 81.0000 mg | DELAYED_RELEASE_TABLET | Freq: Every day | ORAL | 0 refills | Status: AC

## 2017-09-19 MED ORDER — FENTANYL CITRATE (PF) 100 MCG/2ML IJ SOLN
50.0000 ug | INTRAMUSCULAR | Status: DC | PRN
  Administered 2017-09-19: 100 ug via INTRAVENOUS

## 2017-09-19 MED ORDER — ONDANSETRON HCL 4 MG/2ML IJ SOLN
INTRAMUSCULAR | Status: AC
Start: 2017-09-19 — End: ?
  Filled 2017-09-19: qty 2

## 2017-09-19 MED ORDER — CEFAZOLIN SODIUM-DEXTROSE 2-4 GM/100ML-% IV SOLN
INTRAVENOUS | Status: AC
  Filled 2017-09-19: qty 100

## 2017-09-19 MED ORDER — DOCUSATE SODIUM 100 MG PO CAPS: 100.0000 mg | ORAL_CAPSULE | Freq: Two times a day (BID) | ORAL | 0 refills | Status: DC

## 2017-09-19 MED ORDER — ACETAMINOPHEN 500 MG PO TABS
ORAL_TABLET | ORAL | Status: AC
Start: 2017-09-19 — End: ?
  Filled 2017-09-19: qty 2

## 2017-09-19 MED ORDER — BUPIVACAINE HCL (PF) 0.5 % IJ SOLN
INTRAMUSCULAR | Status: AC
  Filled 2017-09-19: qty 30

## 2017-09-19 MED ORDER — PROMETHAZINE HCL 25 MG/ML IJ SOLN: 6.2500 mg | INTRAMUSCULAR | Status: DC | PRN

## 2017-09-19 MED ORDER — OXYCODONE HCL 5 MG/5ML PO SOLN: 5.0000 mg | Freq: Once | ORAL | Status: DC | PRN

## 2017-09-19 MED ORDER — MIDAZOLAM HCL 2 MG/2ML IJ SOLN
INTRAMUSCULAR | Status: AC
  Filled 2017-09-19: qty 2

## 2017-09-19 MED ORDER — OXYCODONE HCL 5 MG PO TABS: 5.0000 mg | ORAL_TABLET | Freq: Once | ORAL | Status: DC | PRN

## 2017-09-19 MED ORDER — ACETAMINOPHEN 500 MG PO TABS
1000.0000 mg | ORAL_TABLET | Freq: Three times a day (TID) | ORAL | 0 refills | Status: AC
Start: 2017-09-19 — End: 2017-10-03

## 2017-09-19 MED ORDER — CLONIDINE HCL (ANALGESIA) 100 MCG/ML EP SOLN
EPIDURAL | Status: DC | PRN
  Administered 2017-09-19: 80 ug

## 2017-09-19 MED ORDER — HYDROMORPHONE HCL 1 MG/ML IJ SOLN: 0.2500 mg | INTRAMUSCULAR | Status: DC | PRN

## 2017-09-19 MED ORDER — LIDOCAINE-EPINEPHRINE 1 %-1:100000 IJ SOLN
INTRAMUSCULAR | Status: AC
  Filled 2017-09-19: qty 2

## 2017-09-19 MED ORDER — ACETAMINOPHEN 500 MG PO TABS
1000.0000 mg | ORAL_TABLET | Freq: Once | ORAL | Status: AC
  Administered 2017-09-19: 1000 mg via ORAL

## 2017-09-19 MED ORDER — KETOROLAC TROMETHAMINE 30 MG/ML IJ SOLN: 30.0000 mg | Freq: Once | INTRAMUSCULAR | Status: DC | PRN

## 2017-09-19 MED ORDER — LACTATED RINGERS IV SOLN
INTRAVENOUS | Status: DC
  Administered 2017-09-19: 08:00:00 via INTRAVENOUS

## 2017-09-19 MED ORDER — DEXAMETHASONE SODIUM PHOSPHATE 10 MG/ML IJ SOLN
INTRAMUSCULAR | Status: AC
  Filled 2017-09-19: qty 1

## 2017-09-19 MED ORDER — OXYCODONE HCL 5 MG PO TABS: 5.0000 mg | ORAL_TABLET | ORAL | 0 refills | Status: AC | PRN

## 2017-09-19 MED ORDER — PROPOFOL 10 MG/ML IV BOLUS
INTRAVENOUS | Status: DC | PRN
  Administered 2017-09-19: 200 mg via INTRAVENOUS

## 2017-09-19 MED ORDER — BUPIVACAINE HCL (PF) 0.25 % IJ SOLN
INTRAMUSCULAR | Status: AC
  Filled 2017-09-19: qty 60

## 2017-09-19 MED ORDER — GABAPENTIN 300 MG PO CAPS
ORAL_CAPSULE | ORAL | Status: AC
  Filled 2017-09-19: qty 1

## 2017-09-19 SURGICAL SUPPLY — 68 items
BANDAGE ACE 4X5 VEL STRL LF (GAUZE/BANDAGES/DRESSINGS) ×2 IMPLANT
BANDAGE ACE 6X5 VEL STRL LF (GAUZE/BANDAGES/DRESSINGS) ×2 IMPLANT
BANDAGE ESMARK 6X9 LF (GAUZE/BANDAGES/DRESSINGS) ×1 IMPLANT
BIT DRILL 3.5X122MM AO FIT (BIT) ×2 IMPLANT
BLADE SURG 15 STRL LF DISP TIS (BLADE) ×2 IMPLANT
BLADE SURG 15 STRL SS (BLADE) ×2
BNDG COHESIVE 4X5 TAN STRL (GAUZE/BANDAGES/DRESSINGS) ×2 IMPLANT
BNDG ESMARK 6X9 LF (GAUZE/BANDAGES/DRESSINGS) ×2
CHLORAPREP W/TINT 26ML (MISCELLANEOUS) ×2 IMPLANT
CLSR STERI-STRIP ANTIMIC 1/2X4 (GAUZE/BANDAGES/DRESSINGS) ×2 IMPLANT
COVER BACK TABLE 60X90IN (DRAPES) ×2 IMPLANT
CUFF TOURNIQUET SINGLE 24IN (TOURNIQUET CUFF) IMPLANT
CUFF TOURNIQUET SINGLE 34IN LL (TOURNIQUET CUFF) IMPLANT
DECANTER SPIKE VIAL GLASS SM (MISCELLANEOUS) IMPLANT
DRAPE EXTREMITY T 121X128X90 (DRAPE) ×2 IMPLANT
DRAPE IMP U-DRAPE 54X76 (DRAPES) ×2 IMPLANT
DRAPE OEC MINIVIEW 54X84 (DRAPES) ×2 IMPLANT
DRAPE U-SHAPE 47X51 STRL (DRAPES) ×2 IMPLANT
DRILL 2.6X122MM WL AO SHAFT (BIT) ×4 IMPLANT
DRSG EMULSION OIL 3X3 NADH (GAUZE/BANDAGES/DRESSINGS) ×2 IMPLANT
DRSG PAD ABDOMINAL 8X10 ST (GAUZE/BANDAGES/DRESSINGS) ×2 IMPLANT
ELECT REM PT RETURN 9FT ADLT (ELECTROSURGICAL) ×2
ELECTRODE REM PT RTRN 9FT ADLT (ELECTROSURGICAL) ×1 IMPLANT
GAUZE SPONGE 4X4 12PLY STRL (GAUZE/BANDAGES/DRESSINGS) ×2 IMPLANT
GLOVE BIO SURGEON STRL SZ7 (GLOVE) ×2 IMPLANT
GLOVE BIO SURGEON STRL SZ7.5 (GLOVE) ×4 IMPLANT
GLOVE BIOGEL PI IND STRL 7.0 (GLOVE) ×1 IMPLANT
GLOVE BIOGEL PI IND STRL 8 (GLOVE) ×2 IMPLANT
GLOVE BIOGEL PI INDICATOR 7.0 (GLOVE) ×1
GLOVE BIOGEL PI INDICATOR 8 (GLOVE) ×2
GOWN STRL REUS W/ TWL LRG LVL3 (GOWN DISPOSABLE) ×1 IMPLANT
GOWN STRL REUS W/ TWL XL LVL3 (GOWN DISPOSABLE) ×2 IMPLANT
GOWN STRL REUS W/TWL LRG LVL3 (GOWN DISPOSABLE) ×1
GOWN STRL REUS W/TWL XL LVL3 (GOWN DISPOSABLE) ×2
IMPL TIGHTROP W/DRV K-LESS (Anchor) ×1 IMPLANT
IMPLANT TIGHTROPE W/DRV K-LESS (Anchor) ×2 IMPLANT
NEEDLE HYPO 22GX1.5 SAFETY (NEEDLE) IMPLANT
NS IRRIG 1000ML POUR BTL (IV SOLUTION) ×2 IMPLANT
PACK BASIN DAY SURGERY FS (CUSTOM PROCEDURE TRAY) ×2 IMPLANT
PAD CAST 4YDX4 CTTN HI CHSV (CAST SUPPLIES) ×1 IMPLANT
PADDING CAST ABS 4INX4YD NS (CAST SUPPLIES) ×2
PADDING CAST ABS COTTON 4X4 ST (CAST SUPPLIES) ×2 IMPLANT
PADDING CAST COTTON 4X4 STRL (CAST SUPPLIES) ×1
PADDING CAST COTTON 6X4 STRL (CAST SUPPLIES) ×2 IMPLANT
PENCIL BUTTON HOLSTER BLD 10FT (ELECTRODE) ×2 IMPLANT
PLATE 1/3 TUBULAR 7H (Plate) ×2 IMPLANT
SCREW CORTEX ST MATTA 3.5X12MM (Screw) ×10 IMPLANT
SCREW CORTEX ST MATTA 3.5X14 (Screw) ×4 IMPLANT
SLEEVE SCD COMPRESS KNEE MED (MISCELLANEOUS) IMPLANT
SPLINT FAST PLASTER 5X30 (CAST SUPPLIES) ×20
SPLINT PLASTER CAST FAST 5X30 (CAST SUPPLIES) ×20 IMPLANT
SPONGE LAP 4X18 RFD (DISPOSABLE) ×2 IMPLANT
SUCTION FRAZIER HANDLE 10FR (MISCELLANEOUS) ×1
SUCTION TUBE FRAZIER 10FR DISP (MISCELLANEOUS) ×1 IMPLANT
SUT ETHILON 3 0 PS 1 (SUTURE) ×4 IMPLANT
SUT MNCRL AB 4-0 PS2 18 (SUTURE) IMPLANT
SUT MON AB 2-0 CT1 36 (SUTURE) IMPLANT
SUT MON AB 3-0 SH 27 (SUTURE)
SUT MON AB 3-0 SH27 (SUTURE) IMPLANT
SUT VIC AB 0 SH 27 (SUTURE) ×2 IMPLANT
SUT VIC AB 2-0 SH 27 (SUTURE)
SUT VIC AB 2-0 SH 27XBRD (SUTURE) IMPLANT
SYR BULB 3OZ (MISCELLANEOUS) ×2 IMPLANT
SYR CONTROL 10ML LL (SYRINGE) IMPLANT
TOWEL GREEN STERILE FF (TOWEL DISPOSABLE) ×4 IMPLANT
TOWEL OR NON WOVEN STRL DISP B (DISPOSABLE) ×2 IMPLANT
TUBE CONNECTING 20X1/4 (TUBING) ×2 IMPLANT
UNDERPAD 30X30 (UNDERPADS AND DIAPERS) ×2 IMPLANT

## 2017-09-19 NOTE — Transfer of Care (Signed)
Immediate Anesthesia Transfer of Care Note  Patient: Jonathon Walker  Procedure(s) Performed: OPEN REDUCTION INTERNAL FIXATION (ORIF) ANKLE FRACTURE (Left Ankle)  Patient Location: PACU  Anesthesia Type:GA combined with regional for post-op pain  Level of Consciousness: sedated  Airway & Oxygen Therapy: Patient Spontanous Breathing and Patient connected to face mask oxygen  Post-op Assessment: Report given to RN and Post -op Vital signs reviewed and stable  Post vital signs: Reviewed and stable  Last Vitals:  Vitals Value Taken Time  BP    Temp    Pulse 82 09/19/2017 10:17 AM  Resp 20 09/19/2017 10:17 AM  SpO2 100 % 09/19/2017 10:17 AM  Vitals shown include unvalidated device data.  Last Pain:  Vitals:   09/19/17 0741  TempSrc: Oral  PainSc: 0-No pain      Patients Stated Pain Goal: 0 (30/09/79 4997)  Complications: No apparent anesthesia complications

## 2017-09-19 NOTE — Discharge Instructions (Signed)
Elevate leg - Toes above nose as much as possible to reduce pain / swelling. If needed, you may increase pain medication for the first few days post op to 2 tablets every 4 hours.  You may loosen and re-apply ace wrap if it feels too tight.  Weight Bearing:  Non weight bearing affected leg.  Diet: As you were doing prior to hospitalization   Shower:  You have a splint on, leave the splint in place and keep the splint dry with a plastic bag.  Dressing:  You have a splint. Leave the splint in place and we will change your bandages during your first follow-up appointment.    Activity:  Increase activity slowly as tolerated, but follow the weight bearing instructions below.  The rules on driving is that you can not be taking narcotics while you drive, and you must feel in control of the vehicle.    To prevent constipation:  Narcotic medicines cause constipation.  Wean these as soon as is appropriate.   You may use a stool softener such as -  Colace (over the counter) 100 mg by mouth twice a day  Drink plenty of fluids (prune juice may be helpful) and high fiber foods Miralax (over the counter) for constipation as needed.    Itching:  If you experience itching with your medications, try taking only a single pain pill, or even half a pain pill at a time.  You can also use benadryl over the counter for itching or also to help with sleep.   Precautions:  If you experience chest pain or shortness of breath - call 911 immediately for transfer to the hospital emergency department!!  If you develop a fever greater that 101 F, purulent drainage from wound, increased redness or drainage from wound, or calf pain -- Call the office at [phone]                                                 Follow- Up Appointment:  Please call for an appointment to be seen in 2 weeks [place] - [phone]    Tylenol 1,000mg  given today at 7:45AM    [place]  Instructions  Activity: Get plenty of rest for the remainder of the day. A responsible individual must stay with you for 24 hours following the procedure.  For the next 24 hours, DO NOT: -Drive a car -Paediatric nurse -Drink alcoholic beverages -Take any medication unless instructed by your physician -Make any legal decisions or sign important papers.  Meals: Start with liquid foods such as gelatin or soup. Progress to regular foods as tolerated. Avoid greasy, spicy, heavy foods. If nausea and/or vomiting occur, drink only clear liquids until the nausea and/or vomiting subsides. Call your physician if vomiting continues.  Special Instructions/Symptoms: Your throat may feel dry or sore from the anesthesia or the breathing tube placed in your throat during surgery. If this causes discomfort, gargle with warm salt water. The discomfort should disappear within 24 hours.  If you had a scopolamine patch placed behind your ear for the management of post- operative nausea and/or vomiting:  1. The medication in the patch is effective for 72 hours, after which it should be removed.  Wrap patch in a tissue and discard in the trash. Wash hands thoroughly with soap and water. 2. You may remove  the patch earlier than 72 hours if you experience unpleasant side effects which may include dry mouth, dizziness or visual disturbances. 3. Avoid touching the patch. Wash your hands with soap and water after contact with the patch.     Regional Anesthesia Blocks  1. Numbness or the inability to move the "blocked" extremity may last from 3-48 hours after placement. The length of time depends on the medication injected and your individual response to the medication. If the numbness is not going away after 48 hours, call your surgeon.  2. The extremity that is blocked will need to be protected until the numbness is gone and the  Strength has returned. Because you cannot feel it, you will need to take extra care  to avoid injury. Because it may be weak, you may have difficulty moving it or using it. You may not know what position it is in without looking at it while the block is in effect.  3. For blocks in the legs and feet, returning to weight bearing and walking needs to be done carefully. You will need to wait until the numbness is entirely gone and the strength has returned. You should be able to move your leg and foot normally before you try and bear weight or walk. You will need someone to be with you when you first try to ensure you do not fall and possibly risk injury.  4. Bruising and tenderness at the needle site are common side effects and will resolve in a few days.  5. Persistent numbness or new problems with movement should be communicated to the surgeon or the [place] [phone] [place] [phone]).

## 2017-09-19 NOTE — Interval H&P Note (Signed)
History and Physical Interval Note:  09/19/2017 8:52 AM  Jonathon Walker  has presented today for surgery, with the diagnosis of DISPLACED BIMALLEOLAR FRACTURE OF UNSPECIFIED  LEFT LOWER LEG  The various methods of treatment have been discussed with the patient and family. After consideration of risks, benefits and other options for treatment, the patient has consented to  Procedure(s): OPEN REDUCTION INTERNAL FIXATION (ORIF) ANKLE FRACTURE (Left) as a surgical intervention .  The patient's history has been reviewed, patient examined, no change in status, stable for surgery.  I have reviewed the patient's chart and labs.  Questions were answered to the patient's satisfaction.     Soumya Colson D

## 2017-09-19 NOTE — Anesthesia Postprocedure Evaluation (Signed)
Anesthesia Post Note  Patient: Jonathon Walker  Procedure(s) Performed: OPEN REDUCTION INTERNAL FIXATION (ORIF) ANKLE FRACTURE (Left Ankle)     Patient location during evaluation: PACU Anesthesia Type: General Level of consciousness: sedated and patient cooperative Pain management: pain level controlled Vital Signs Assessment: post-procedure vital signs reviewed and stable Respiratory status: spontaneous breathing Cardiovascular status: stable Anesthetic complications: no    Last Vitals:  Vitals:   09/19/17 1046 09/19/17 1101  BP: (!) 155/99 (!) 147/85  Pulse: 76 72  Resp: 17   Temp:  36.8 C  SpO2: 98% 95%    Last Pain:  Vitals:   09/19/17 1101  TempSrc: Oral  PainSc: 0-No pain                 Nolon Nations

## 2017-09-19 NOTE — Progress Notes (Signed)
Assisted Dr. Germeroth with left, ultrasound guided, popliteal/saphenous block. Side rails up, monitors on throughout procedure. See vital signs in flow sheet. Tolerated Procedure well. 

## 2017-09-19 NOTE — Anesthesia Procedure Notes (Signed)
Procedure Name: LMA Insertion Date/Time: 09/19/2017 9:09 AM Performed by: Maryella Shivers, CRNA Pre-anesthesia Checklist: Patient identified, Emergency Drugs available, Suction available and Patient being monitored Patient Re-evaluated:Patient Re-evaluated prior to induction Oxygen Delivery Method: Circle system utilized Preoxygenation: Pre-oxygenation with 100% oxygen Induction Type: IV induction Ventilation: Mask ventilation without difficulty LMA: LMA inserted LMA Size: 5.0 Number of attempts: 1 Airway Equipment and Method: Bite block Placement Confirmation: positive ETCO2 Tube secured with: Tape Dental Injury: Teeth and Oropharynx as per pre-operative assessment

## 2017-09-19 NOTE — Op Note (Signed)
09/19/2017  2:15 PM  PATIENT:  Jonathon Walker    PRE-OPERATIVE DIAGNOSIS:  DISPLACED BIMALLEOLAR FRACTURE OF UNSPECIFIED  LEFT LOWER LEG  POST-OPERATIVE DIAGNOSIS:  Same  PROCEDURE:  OPEN REDUCTION INTERNAL FIXATION (ORIF) ANKLE FRACTURE  SURGEON:  Dhyan Noah, Ernesta Amble, MD  ASSISTANT: Roxan Hockey, PA-C, he was present and scrubbed throughout the case, critical for completion in a timely fashion, and for retraction, instrumentation, and closure.   ANESTHESIA:   gen  PREOPERATIVE INDICATIONS:  Jonathon Walker is a  52 y.o. male with a diagnosis of DISPLACED BIMALLEOLAR FRACTURE OF UNSPECIFIED  LEFT LOWER LEG who failed conservative measures and elected for surgical management.    The risks benefits and alternatives were discussed with the patient preoperatively including but not limited to the risks of infection, bleeding, nerve injury, cardiopulmonary complications, the need for revision surgery, among others, and the patient was willing to proceed.  OPERATIVE IMPLANTS: stryker stainless plate, tight rope  OPERATIVE FINDINGS: Unstable ankle fracture. Stable syndesmosis post op  BLOOD LOSS: min  COMPLICATIONS: none  TOURNIQUET TIME: 53min  OPERATIVE PROCEDURE:  Patient was identified in the preoperative holding area and site was marked by me He was transported to the operating theater and placed on the table in supine position taking care to pad all bony prominences. After a preincinduction time out anesthesia was induced. The left lower extremity was prepped and draped in normal sterile fashion and a pre-incision timeout was performed. Jonathon Walker received ancef for preoperative antibiotics.   I made a lateral incision of roughly 7 cm dissection was carried down sharply to the distal fibula and then spreading dissection was used proximally to protect the superficial peroneal nerve. I sharply incised the periosteum and took care to protect the peroneal tendons. I then debrided  the fracture site and performed a reduction maneuver which was held in place with a clamp.   I placed a lag screw across the fracture  I then selected a 7-hole one third tubular plate and placed in a neutralization fashion care was taken distally so as not to penetrate the joint with the cancellus screws.  I then stressed the syndesmosis and it was unstable. I placed a tight rope through the plate with an excellent reduction of his syndesmosis  The wound was then thoroughly irrigated and closed using a 0 Vicryl and absorbable Monocryl sutures. He was placed in a short leg splint.   POST OPERATIVE PLAN: Non-weightbearing. DVT prophylaxis will consist of ASA and mobililzation

## 2017-09-19 NOTE — Anesthesia Procedure Notes (Signed)
Anesthesia Regional Block: Popliteal block   Pre-Anesthetic Checklist: ,, timeout performed, Correct Patient, Correct Site, Correct Laterality, Correct Procedure, Correct Position, site marked, Risks and benefits discussed,  Surgical consent,  Pre-op evaluation,  At surgeon's request and post-op pain management  Laterality: Left  Prep: chloraprep       Needles:  Injection technique: Single-shot  Needle Type: Stimiplex     Needle Length: 10cm  Needle Gauge: 21     Additional Needles:   Procedures:,,,, ultrasound used (permanent image in chart),,,,  Motor weakness within 5 minutes.   Nerve Stimulator or Paresthesia:  Response: 0.5 mA,   Additional Responses:   Narrative:  Start time: 09/19/2017 8:04 AM End time: 09/19/2017 8:09 AM Injection made incrementally with aspirations every 5 mL.  Performed by: Personally  Anesthesiologist: Nolon Nations, MD  Additional Notes: Nerve located and needle positioned with direct ultrasound guidance. Good perineural spread. Patient tolerated well.

## 2017-09-19 NOTE — Anesthesia Procedure Notes (Signed)
Anesthesia Regional Block: Adductor canal block   Pre-Anesthetic Checklist: ,, timeout performed, Correct Patient, Correct Site, Correct Laterality, Correct Procedure, Correct Position, site marked, Risks and benefits discussed,  Surgical consent,  Pre-op evaluation,  At surgeon's request and post-op pain management  Laterality: Left  Prep: chloraprep       Needles:  Injection technique: Single-shot  Needle Type: Stimiplex     Needle Length: 9cm  Needle Gauge: 21     Additional Needles:   Procedures:,,,, ultrasound used (permanent image in chart),,,,  Narrative:  Start time: 09/19/2017 7:59 AM End time: 09/19/2017 8:01 AM Injection made incrementally with aspirations every 5 mL.  Performed by: Personally  Anesthesiologist: Nolon Nations, MD  Additional Notes: BP cuff, EKG monitors applied. Sedation begun. Artery and nerve location verified with U/S and anesthetic injected incrementally, slowly, and after negative aspirations under direct u/s guidance. Good fascial /perineural spread. Tolerated well.

## 2017-09-20 ENCOUNTER — Encounter (HOSPITAL_BASED_OUTPATIENT_CLINIC_OR_DEPARTMENT_OTHER): Payer: Self-pay | Admitting: Orthopedic Surgery

## 2017-09-29 DIAGNOSIS — S82842D Displaced bimalleolar fracture of left lower leg, subsequent encounter for closed fracture with routine healing: Secondary | ICD-10-CM | POA: Diagnosis not present

## 2017-10-06 DIAGNOSIS — S82842D Displaced bimalleolar fracture of left lower leg, subsequent encounter for closed fracture with routine healing: Secondary | ICD-10-CM | POA: Diagnosis not present

## 2017-10-20 DIAGNOSIS — S82842D Displaced bimalleolar fracture of left lower leg, subsequent encounter for closed fracture with routine healing: Secondary | ICD-10-CM | POA: Diagnosis not present

## 2017-11-22 DIAGNOSIS — S82842D Displaced bimalleolar fracture of left lower leg, subsequent encounter for closed fracture with routine healing: Secondary | ICD-10-CM | POA: Diagnosis not present

## 2018-02-05 ENCOUNTER — Other Ambulatory Visit: Payer: Self-pay | Admitting: Psychiatry

## 2018-02-05 MED ORDER — OLANZAPINE 2.5 MG PO TABS: 2.5000 mg | ORAL_TABLET | Freq: Every day | ORAL | 0 refills | Status: DC

## 2018-02-05 NOTE — Progress Notes (Signed)
Ok early refill olanzapine.

## 2018-02-06 ENCOUNTER — Encounter: Payer: Self-pay | Admitting: Emergency Medicine

## 2018-02-06 DIAGNOSIS — F411 Generalized anxiety disorder: Secondary | ICD-10-CM | POA: Insufficient documentation

## 2018-02-06 DIAGNOSIS — F329 Major depressive disorder, single episode, unspecified: Secondary | ICD-10-CM | POA: Insufficient documentation

## 2018-02-23 ENCOUNTER — Other Ambulatory Visit: Payer: Self-pay | Admitting: Psychiatry

## 2018-02-23 NOTE — Telephone Encounter (Signed)
Need to review paper chart  

## 2018-04-03 ENCOUNTER — Ambulatory Visit: Payer: Self-pay | Admitting: Psychiatry

## 2018-04-18 ENCOUNTER — Encounter: Payer: Self-pay | Admitting: Psychiatry

## 2018-04-18 ENCOUNTER — Ambulatory Visit: Payer: Medicare Other | Admitting: Psychiatry

## 2018-04-18 DIAGNOSIS — F324 Major depressive disorder, single episode, in partial remission: Secondary | ICD-10-CM | POA: Diagnosis not present

## 2018-04-18 DIAGNOSIS — F411 Generalized anxiety disorder: Secondary | ICD-10-CM

## 2018-04-18 DIAGNOSIS — G4733 Obstructive sleep apnea (adult) (pediatric): Secondary | ICD-10-CM

## 2018-04-18 DIAGNOSIS — F1024 Alcohol dependence with alcohol-induced mood disorder: Secondary | ICD-10-CM | POA: Diagnosis not present

## 2018-04-18 MED ORDER — PAROXETINE HCL 40 MG PO TABS: 40.0000 mg | ORAL_TABLET | Freq: Every day | ORAL | 1 refills | Status: DC

## 2018-04-18 MED ORDER — OLANZAPINE 5 MG PO TABS: 5.0000 mg | ORAL_TABLET | Freq: Every day | ORAL | 0 refills | Status: DC

## 2018-04-18 NOTE — Patient Instructions (Addendum)
Counselor options: Beckey Downing, Tawanna Solo Phone: [phone]  Attend AA.

## 2018-04-18 NOTE — Progress Notes (Signed)
Jonathon Walker [id number] [birth year] 53 y.o.  Subjective:   Patient ID:  Jonathon Walker is a 53 y.o. (DOB 01-18-1966) male.  Chief Complaint:  Chief Complaint  Patient presents with  . Follow-up    Medication Management    HPI  Last seen july Jonathon Walker presents to the office today for follow-up of anxiety , depression, alcohol.  Depression still better with Paxil. Pleased with it.   Nerves shot with alcohol.  Shakes.  Hasn't done anything about the alcohol.  Less benefit from olanzapine for sleep.  Takes at 7 pm.  5 hours of sleep. Starts drinking 4-5 pm.  Occ skips days.  Not shakey on the days he skips.  So overwhelmed with anxiety and not calmed by a beer.  Otherwise thinks he'd be ok if could control the alcohol.  The drinking is also in response to anxiety.  Ruminates on drinking and the social aspect of it bc doesn't see a solution.  Overwhelmed he may never be able to control the alcohol.  Trouble going to sleep.  No other major stressors.  Past psych meds:  Paxil 60 "too much",  Buspar stopped DT little effect. Xanax, duloxetine, Wellbutrin, Zoloft, Abilify, Rexulti. Viibryd 60.  Review of Systems:  Review of Systems  Neurological: Negative for tremors and weakness.  Psychiatric/Behavioral: Positive for sleep disturbance. Negative for agitation, behavioral problems, confusion, decreased concentration, dysphoric mood, hallucinations, self-injury and suicidal ideas. The patient is nervous/anxious. The patient is not hyperactive.     Medications: I have reviewed the patient's current medications.  Current Outpatient Medications  Medication Sig Dispense Refill  . OLANZapine (ZYPREXA) 2.5 MG tablet Take 1 tablet (2.5 mg total) by mouth at bedtime. 90 tablet 0  . PARoxetine (PAXIL) 40 MG tablet TAKE 1 AND 1/2 TABLETS BY MOUTH EVERY DAY 135 tablet 0   No current facility-administered medications for this visit.     Medication Side Effects: sexual  Allergies: No Known  Allergies  Past Medical History:  Diagnosis Date  . Anxiety   . Bimalleolar fracture of left ankle   . Depression   . Sleep apnea    uses CPAP nightly    Family History  Problem Relation Age of Onset  . Cancer Maternal Grandfather     Social History   Socioeconomic History  . Marital status: Married    Spouse name: Not on file  . Number of children: Not on file  . Years of education: Not on file  . Highest education level: Not on file  Occupational History  . Not on file  Social Needs  . Financial resource strain: Not on file  . Food insecurity:    Worry: Not on file    Inability: Not on file  . Transportation needs:    Medical: Not on file    Non-medical: Not on file  Tobacco Use  . Smoking status: Current Every Day Smoker    Packs/day: 1.50    Years: 3.00    Pack years: 4.50    Types: Cigarettes  . Smokeless tobacco: Never Used  Substance and Sexual Activity  . Alcohol use: Yes    Comment: drinks 12 beers daily  . Drug use: No  . Sexual activity: Not on file  Lifestyle  . Physical activity:    Days per week: Not on file    Minutes per session: Not on file  . Stress: Not on file  Relationships  . Social connections:    Talks on  phone: Not on file    Gets together: Not on file    Attends religious service: Not on file    Active member of club or organization: Not on file    Attends meetings of clubs or organizations: Not on file    Relationship status: Not on file  . Intimate partner violence:    Fear of current or ex partner: Not on file    Emotionally abused: Not on file    Physically abused: Not on file    Forced sexual activity: Not on file  Other Topics Concern  . Not on file  Social History Narrative  . Not on file    Past Medical History, Surgical history, Social history, and Family history were reviewed and updated as appropriate.   Please see review of systems for further details on the patient's review from today.   Objective:    Physical Exam:  There were no vitals taken for this visit.  Physical Exam Constitutional:      General: He is not in acute distress.    Appearance: He is well-developed.     Comments: Red face  Musculoskeletal:        General: No deformity.  Neurological:     Mental Status: He is alert and oriented to person, place, and time.     Motor: No tremor.     Coordination: Coordination normal.     Gait: Gait normal.  Psychiatric:        Attention and Perception: Attention normal. He is attentive.        Mood and Affect: Mood is anxious. Mood is not depressed. Affect is not labile, blunt, angry or inappropriate.        Speech: Speech normal.        Behavior: Behavior normal.        Thought Content: Thought content normal. Thought content does not include homicidal or suicidal ideation. Thought content does not include homicidal or suicidal plan.        Cognition and Memory: Cognition normal.     Comments: Insight and judgment fair.     Lab Review:     Component Value Date/Time   NA 142 05/05/2010 1049   K 4.3 05/05/2010 1049   CL 107 05/05/2010 1049   CO2 26 05/05/2010 1049   GLUCOSE 76 05/05/2010 1049   BUN 21 05/05/2010 1049   CREATININE 1.03 05/05/2010 1049   CALCIUM 9.5 05/05/2010 1049   PROT 6.7 05/05/2010 1049   ALBUMIN 4.4 05/05/2010 1049   AST 26 05/05/2010 1049   ALT 37 05/05/2010 1049   ALKPHOS 73 05/05/2010 1049   BILITOT 0.4 05/05/2010 1049       Component Value Date/Time   WBC 4.7 05/05/2010 1049   RBC 5.00 05/05/2010 1049   HGB 15.2 05/05/2010 1049   HCT 44.8 05/05/2010 1049   PLT 193 05/05/2010 1049   MCV 89.6 05/05/2010 1049   MCH 30.4 05/05/2010 1049   MCHC 33.9 05/05/2010 1049   RDW 13.1 05/05/2010 1049   LYMPHSABS 1.8 05/05/2010 1049   MONOABS 0.4 05/05/2010 1049   EOSABS 0.2 05/05/2010 1049   BASOSABS 0.0 05/05/2010 1049    No results found for: POCLITH, LITHIUM   No results found for: PHENYTOIN, PHENOBARB, VALPROATE, CBMZ    .res Assessment: Plan:    Alcohol dependence with alcohol-induced mood disorder (HCC)  GAD (generalized anxiety disorder)  Major depressive disorder with single episode, in partial remission (HCC)  OSA (obstructive sleep apnea)  Recommend AA.  Counselors options given.  Option increase Zyprexa for rumination and sleep and severe anxiety and rumination.  Yes to 5mg .  . Discussed potential metabolic side effects associated with atypical antipsychotics, as well as potential risk for movement side effects. Advised pt to contact office if movement side effects occur.  He agrees.  Call if you have any problems with this.  Continue CPAP use.  Benefit from Paxil.  Did not feel well at 60 mg so we will continue 40 mg.  FU 2 mos  Lynder Parents, MD, DFAPA   Please see After Visit Summary for patient specific instructions.  Future Appointments  Date Time Provider [place]  05/31/2018 10:30 AM [name], NP [facility]  07/18/2018  1:00 PM Cottle, Billey Co., MD [facility] None    No orders of the defined types were placed in this encounter.     -------------------------------

## 2018-05-31 ENCOUNTER — Encounter: Payer: Self-pay | Admitting: Adult Health

## 2018-05-31 ENCOUNTER — Ambulatory Visit (INDEPENDENT_AMBULATORY_CARE_PROVIDER_SITE_OTHER): Payer: Medicare Other | Admitting: Adult Health

## 2018-05-31 ENCOUNTER — Other Ambulatory Visit: Payer: Self-pay

## 2018-05-31 VITALS — BP 150/92 | Temp 97.9°F | Wt 249.0 lb

## 2018-05-31 DIAGNOSIS — Z0000 Encounter for general adult medical examination without abnormal findings: Secondary | ICD-10-CM

## 2018-05-31 DIAGNOSIS — G4733 Obstructive sleep apnea (adult) (pediatric): Secondary | ICD-10-CM

## 2018-05-31 DIAGNOSIS — Z125 Encounter for screening for malignant neoplasm of prostate: Secondary | ICD-10-CM

## 2018-05-31 DIAGNOSIS — F411 Generalized anxiety disorder: Secondary | ICD-10-CM | POA: Diagnosis not present

## 2018-05-31 DIAGNOSIS — F324 Major depressive disorder, single episode, in partial remission: Secondary | ICD-10-CM

## 2018-05-31 DIAGNOSIS — I10 Essential (primary) hypertension: Secondary | ICD-10-CM | POA: Diagnosis not present

## 2018-05-31 DIAGNOSIS — Z720 Tobacco use: Secondary | ICD-10-CM

## 2018-05-31 DIAGNOSIS — F1010 Alcohol abuse, uncomplicated: Secondary | ICD-10-CM

## 2018-05-31 LAB — CBC WITH DIFFERENTIAL/PLATELET
BASOS PCT: 1.1 % (ref 0.0–3.0)
Basophils Absolute: 0.1 10*3/uL (ref 0.0–0.1)
Eosinophils Absolute: 0.2 10*3/uL (ref 0.0–0.7)
Eosinophils Relative: 4.3 % (ref 0.0–5.0)
HCT: 48.4 % (ref 39.0–52.0)
Hemoglobin: 17 g/dL (ref 13.0–17.0)
LYMPHS ABS: 1.4 10*3/uL (ref 0.7–4.0)
Lymphocytes Relative: 26.3 % (ref 12.0–46.0)
MCHC: 35.1 g/dL (ref 30.0–36.0)
MCV: 95.3 fl (ref 78.0–100.0)
MONOS PCT: 8.1 % (ref 3.0–12.0)
Monocytes Absolute: 0.4 10*3/uL (ref 0.1–1.0)
NEUTROS ABS: 3.1 10*3/uL (ref 1.4–7.7)
NEUTROS PCT: 60.2 % (ref 43.0–77.0)
PLATELETS: 236 10*3/uL (ref 150.0–400.0)
RBC: 5.08 Mil/uL (ref 4.22–5.81)
RDW: 12.7 % (ref 11.5–15.5)
WBC: 5.2 10*3/uL (ref 4.0–10.5)

## 2018-05-31 LAB — COMPREHENSIVE METABOLIC PANEL
ALK PHOS: 79 U/L (ref 39–117)
ALT: 19 U/L (ref 0–53)
AST: 23 U/L (ref 0–37)
Albumin: 4.6 g/dL (ref 3.5–5.2)
BUN: 12 mg/dL (ref 6–23)
CO2: 27 meq/L (ref 19–32)
Calcium: 9.8 mg/dL (ref 8.4–10.5)
Chloride: 102 mEq/L (ref 96–112)
Creatinine, Ser: 1.14 mg/dL (ref 0.40–1.50)
GFR: 67.13 mL/min (ref >60.00)
GLUCOSE: 103 mg/dL — AB (ref 70–99)
Potassium: 4.4 mEq/L (ref 3.5–5.1)
Sodium: 139 mEq/L (ref 135–145)
TOTAL PROTEIN: 7.6 g/dL (ref 6.0–8.3)
Total Bilirubin: 0.6 mg/dL (ref 0.2–1.2)

## 2018-05-31 LAB — TSH: TSH: 1.8 u[IU]/mL (ref 0.35–4.50)

## 2018-05-31 LAB — LIPID PANEL
Cholesterol: 290 mg/dL — ABNORMAL HIGH (ref 0–200)
HDL: 51.5 mg/dL (ref >39.00)
NONHDL: 238.45
Total CHOL/HDL Ratio: 6
Triglycerides: 202 mg/dL — ABNORMAL HIGH (ref 0.0–149.0)
VLDL: 40.4 mg/dL — ABNORMAL HIGH (ref 0.0–40.0)

## 2018-05-31 LAB — HEMOGLOBIN A1C: Hgb A1c MFr Bld: 5.2 % (ref 4.6–6.5)

## 2018-05-31 LAB — LDL CHOLESTEROL, DIRECT: Direct LDL: 204 mg/dL

## 2018-05-31 LAB — PSA: PSA: 0.42 ng/mL (ref 0.10–4.00)

## 2018-05-31 MED ORDER — LISINOPRIL 10 MG PO TABS: 10.0000 mg | ORAL_TABLET | Freq: Every day | ORAL | 0 refills | Status: DC

## 2018-05-31 NOTE — Patient Instructions (Signed)
It was great seeing you today   I have prescribed lisinopril 10 mg to help bring down your blood pressure   We will follow up with you regarding your labs   Please work on quitting smoking and drinking   Work on exercise and heart healthy diet   Follow up in 2 -3 weeks or sooner if needed

## 2018-05-31 NOTE — Progress Notes (Signed)
Patient presents to clinic today to establish care. He is a pleasant 53 year old male who  has a past medical history of Anxiety, Bimalleolar fracture of left ankle, Depression, and Sleep apnea.  He is a former patient of Dr. Sheryn Bison   His last CPE was in 2015   Acute Concerns: Establish Care/CPE   Chronic Issues Anxiety and Depression -Being treated by Psychiatry , Dr. Cristy Friedlander. Currently prescribed Paxil 40 mg. - well managed   Insomnia - Takes Zyprex 5 mg - well managed   Alcohol Abuse - Starts drinking around 4-5 PM. He will occassionally skip a day,  denies withdrawal symptoms when he skips.  Reports that he drinks in response to his anxiety because it makes him feel more calm. When he does drink he will drink between 5-15 beers. Does not drink liquor. He is going to start seeing a therapist   Tobacco Use- Smokes about 1.5 packs a day. Has been smoking on and off since high school. He is not ready to quit smoking at this time   Sleep Apnea - uses Cpap every night. Denies any issues. Feels rested when he wakes up.   Essential Hypertension - He was told by his previous PCP to monitor his BP but he does not check at home. BP has been elevated over the last few visits in the [facility]   BP Readings from Last 3 Encounters:  05/31/18 (!) 150/92  09/19/17 (!) 147/85  11/13/12 132/90     Health Maintenance: Dental -- Does not do routine care Vision -- Does not do routine care  Immunizations -- Refuses flu vaccination  Colonoscopy --Never had    Past Medical History:  Diagnosis Date  . Anxiety   . Bimalleolar fracture of left ankle   . Depression   . Sleep apnea    uses CPAP nightly    Past Surgical History:  Procedure Laterality Date  . ORIF ANKLE FRACTURE Left 09/19/2017   Procedure: OPEN REDUCTION INTERNAL FIXATION (ORIF) ANKLE FRACTURE;  Surgeon: Renette Butters, MD;  Location: [facility];  Service: Orthopedics;  Laterality: Left;  . TOOTH  EXTRACTION      Current Outpatient Medications on File Prior to Visit  Medication Sig Dispense Refill  . OLANZapine (ZYPREXA) 5 MG tablet Take 1 tablet (5 mg total) by mouth at bedtime. 90 tablet 0  . PARoxetine (PAXIL) 40 MG tablet Take 1 tablet (40 mg total) by mouth daily. 90 tablet 1   No current facility-administered medications on file prior to visit.     No Known Allergies  Family History  Problem Relation Age of Onset  . Cancer Maternal Grandfather     Social History   Socioeconomic History  . Marital status: Married    Spouse name: Not on file  . Number of children: Not on file  . Years of education: Not on file  . Highest education level: Not on file  Occupational History  . Not on file  Social Needs  . Financial resource strain: Not on file  . Food insecurity:    Worry: Not on file    Inability: Not on file  . Transportation needs:    Medical: Not on file    Non-medical: Not on file  Tobacco Use  . Smoking status: Current Every Day Smoker    Packs/day: 1.50    Years: 3.00    Pack years: 4.50    Types: Cigarettes  . Smokeless tobacco: Never  Used  Substance and Sexual Activity  . Alcohol use: Yes    Comment: drinks 12 beers daily  . Drug use: No  . Sexual activity: Not on file  Lifestyle  . Physical activity:    Days per week: Not on file    Minutes per session: Not on file  . Stress: Not on file  Relationships  . Social connections:    Talks on phone: Not on file    Gets together: Not on file    Attends religious service: Not on file    Active member of club or organization: Not on file    Attends meetings of clubs or organizations: Not on file    Relationship status: Not on file  . Intimate partner violence:    Fear of current or ex partner: Not on file    Emotionally abused: Not on file    Physically abused: Not on file    Forced sexual activity: Not on file  Other Topics Concern  . Not on file  Social History Narrative  . Not on file     Review of Systems  Constitutional: Negative.   Eyes: Negative.   Respiratory: Negative.   Cardiovascular: Negative.   Gastrointestinal: Negative.   Genitourinary: Negative.   Musculoskeletal: Negative.   Skin: Negative.   Neurological: Negative.   Psychiatric/Behavioral: Positive for depression. The patient is nervous/anxious and has insomnia.   All other systems reviewed and are negative.     BP (!) 150/92   Temp 97.9 F (36.6 C)   Wt 249 lb (112.9 kg)   BMI 31.12 kg/m   Physical Exam Vitals signs and nursing note reviewed.  Constitutional:      Appearance: Normal appearance. He is obese.  HENT:     Head: Normocephalic and atraumatic.     Right Ear: Tympanic membrane, ear canal and external ear normal. There is no impacted cerumen.     Left Ear: Tympanic membrane, ear canal and external ear normal. There is no impacted cerumen.     Nose: Nose normal. No congestion or rhinorrhea.     Mouth/Throat:     Mouth: Mucous membranes are moist.     Pharynx: Oropharynx is clear.  Eyes:     Extraocular Movements: Extraocular movements intact.     Pupils: Pupils are equal, round, and reactive to light.  Neck:     Musculoskeletal: Normal range of motion and neck supple.  Cardiovascular:     Rate and Rhythm: Normal rate and regular rhythm.     Pulses: Normal pulses.     Heart sounds: Normal heart sounds.  Pulmonary:     Effort: Pulmonary effort is normal.     Breath sounds: Normal breath sounds.  Abdominal:     General: Abdomen is flat.     Palpations: Abdomen is soft.  Musculoskeletal: Normal range of motion.  Skin:    General: Skin is warm and dry.  Neurological:     General: No focal deficit present.     Mental Status: He is alert and oriented to person, place, and time.  Psychiatric:        Mood and Affect: Mood is anxious.        Behavior: Behavior normal.        Thought Content: Thought content normal.        Cognition and Memory: Cognition and memory normal.         Judgment: Judgment normal.    Assessment/Plan: 1. Essential hypertension - will  start on lisinopril. We discussed side effects and return precautions.  - Follow up in 2-3 weeks  - lisinopril (PRINIVIL,ZESTRIL) 10 MG tablet; Take 1 tablet (10 mg total) by mouth daily.  Dispense: 30 tablet; Refill: 0 - CBC with Differential/Platelet - Comprehensive metabolic panel - Hemoglobin A1c - Lipid panel - TSH  2. Routine general medical examination at a health care facility - - lisinopril (PRINIVIL,ZESTRIL) 10 MG tablet; Take 1 tablet (10 mg total) by mouth daily.  Dispense: 30 tablet; Refill: 0 - CBC with Differential/Platelet - Comprehensive metabolic panel - Hemoglobin A1c - Lipid panel - TSH  3. GAD (generalized anxiety disorder) - Continue with Psychiatry plan of care   4. OSA (obstructive sleep apnea) - Continue with CPAP  - CBC with Differential/Platelet - Comprehensive metabolic panel - Hemoglobin A1c - Lipid panel - TSH  5. Major depressive disorder with single episode, in partial remission ([place]) - Continue with Psychiatry plan of care   6. Prostate cancer screening  - PSA  7. Tobacco use - Encouraged to cut back on smoking   8. Alcohol abuse - Encouraged counseling for CBI therapy

## 2018-06-05 ENCOUNTER — Other Ambulatory Visit: Payer: Self-pay | Admitting: Family Medicine

## 2018-06-05 MED ORDER — ATORVASTATIN CALCIUM 40 MG PO TABS: 40.0000 mg | ORAL_TABLET | Freq: Every day | ORAL | 3 refills | Status: DC

## 2018-06-26 ENCOUNTER — Other Ambulatory Visit: Payer: Self-pay | Admitting: Psychiatry

## 2018-06-29 ENCOUNTER — Other Ambulatory Visit: Payer: Self-pay | Admitting: Psychiatry

## 2018-07-04 ENCOUNTER — Other Ambulatory Visit: Payer: Self-pay | Admitting: Adult Health

## 2018-07-04 DIAGNOSIS — I10 Essential (primary) hypertension: Secondary | ICD-10-CM

## 2018-07-18 ENCOUNTER — Ambulatory Visit: Payer: Medicare Other | Admitting: Psychiatry

## 2018-08-03 ENCOUNTER — Other Ambulatory Visit: Payer: Self-pay | Admitting: Adult Health

## 2018-08-03 DIAGNOSIS — I10 Essential (primary) hypertension: Secondary | ICD-10-CM

## 2018-08-06 NOTE — Telephone Encounter (Signed)
LMOM.  Pt needs follow up BP.

## 2018-08-09 ENCOUNTER — Other Ambulatory Visit: Payer: Self-pay | Admitting: Adult Health

## 2018-08-09 DIAGNOSIS — I10 Essential (primary) hypertension: Secondary | ICD-10-CM

## 2018-08-09 NOTE — Telephone Encounter (Signed)
Sent to the pharmacy by e-scribe for 30 days.  Pt out of medication.  Scheduled for follow up wth [facility].  Pt requested to be seen in office.

## 2018-08-14 ENCOUNTER — Other Ambulatory Visit: Payer: Self-pay

## 2018-08-14 ENCOUNTER — Encounter: Payer: Self-pay | Admitting: Adult Health

## 2018-08-14 ENCOUNTER — Ambulatory Visit (INDEPENDENT_AMBULATORY_CARE_PROVIDER_SITE_OTHER): Payer: Medicare Other | Admitting: Adult Health

## 2018-08-14 VITALS — BP 144/88 | Temp 98.1°F | Wt 252.0 lb

## 2018-08-14 DIAGNOSIS — I10 Essential (primary) hypertension: Secondary | ICD-10-CM

## 2018-08-14 MED ORDER — LISINOPRIL 30 MG PO TABS: 30.0000 mg | ORAL_TABLET | Freq: Every day | ORAL | 1 refills | Status: DC

## 2018-08-14 NOTE — Progress Notes (Signed)
Subjective:    Patient ID: Jonathon Walker, male    DOB: [birth year], 53 y.o.   MRN: [id number]  HPI   53 year old male is being evaluated today for follow-up regarding essential hypertension.  Approximately 2 months ago he was started on lisinopril 10 mg.  He reports taking this medication every day and has not experienced any side effects.  Denies dizziness, lightheadedness, headaches, or blurred vision.  He is not monitoring his blood pressure at home despite having a blood pressure cuff.  Today in the office his blood pressure is 144/88  BP Readings from Last 3 Encounters:  08/14/18 (!) 144/88  05/31/18 (!) 150/92  09/19/17 (!) 147/85   Additionally, he is happy to report that recently he started seeing an alcohol counselor to help with his alcohol abuse.  He has not started cutting back on alcohol consumption yet though.  Review of Systems   See HPI    Past Medical History:  Diagnosis Date  . Anxiety   . Bimalleolar fracture of left ankle   . Depression   . Sleep apnea    uses CPAP nightly    Social History   Socioeconomic History  . Marital status: Single    Spouse name: Not on file  . Number of children: Not on file  . Years of education: Not on file  . Highest education level: Not on file  Occupational History  . Not on file  Social Needs  . Financial resource strain: Not on file  . Food insecurity:    Worry: Not on file    Inability: Not on file  . Transportation needs:    Medical: Not on file    Non-medical: Not on file  Tobacco Use  . Smoking status: Current Every Day Smoker    Packs/day: 1.50    Years: 3.00    Pack years: 4.50    Types: Cigarettes  . Smokeless tobacco: Never Used  Substance and Sexual Activity  . Alcohol use: Yes    Comment: drinks 12 beers daily  . Drug use: No  . Sexual activity: Not on file  Lifestyle  . Physical activity:    Days per week: Not on file    Minutes per session: Not on file  . Stress: Not on file   Relationships  . Social connections:    Talks on phone: Not on file    Gets together: Not on file    Attends religious service: Not on file    Active member of club or organization: Not on file    Attends meetings of clubs or organizations: Not on file    Relationship status: Not on file  . Intimate partner violence:    Fear of current or ex partner: Not on file    Emotionally abused: Not on file    Physically abused: Not on file    Forced sexual activity: Not on file  Other Topics Concern  . Not on file  Social History Narrative  . Not on file    Past Surgical History:  Procedure Laterality Date  . ORIF ANKLE FRACTURE Left 09/19/2017   Procedure: OPEN REDUCTION INTERNAL FIXATION (ORIF) ANKLE FRACTURE;  Surgeon: Renette Butters, MD;  Location: [facility];  Service: Orthopedics;  Laterality: Left;  . TOOTH EXTRACTION      Family History  Problem Relation Age of Onset  . Cancer Maternal Grandfather     No Known Allergies  Current Outpatient Medications on File Prior to  Visit  Medication Sig Dispense Refill  . atorvastatin (LIPITOR) 40 MG tablet Take 1 tablet (40 mg total) by mouth daily. 90 tablet 3  . lisinopril (ZESTRIL) 10 MG tablet TAKE 1 TABLET(10 MG) BY MOUTH DAILY 30 tablet 0  . OLANZapine (ZYPREXA) 5 MG tablet TAKE 1 TABLET(5 MG) BY MOUTH AT BEDTIME 90 tablet 0  . PARoxetine (PAXIL) 40 MG tablet TAKE 1 AND 1/2 TABLETS BY MOUTH EVERY DAY 135 tablet 0   No current facility-administered medications on file prior to visit.     BP (!) 144/88   Temp 98.1 F (36.7 C)   Wt 252 lb (114.3 kg)   BMI 31.50 kg/m       Objective:   Physical Exam Vitals signs and nursing note reviewed.  Constitutional:      Appearance: Normal appearance.  Cardiovascular:     Rate and Rhythm: Normal rate and regular rhythm.     Pulses: Normal pulses.     Heart sounds: Normal heart sounds.  Pulmonary:     Effort: Pulmonary effort is normal.     Breath sounds: Normal  breath sounds.  Skin:    General: Skin is warm and dry.     Capillary Refill: Capillary refill takes less than 2 seconds.  Neurological:     General: No focal deficit present.     Mental Status: He is alert and oriented to person, place, and time.  Psychiatric:        Mood and Affect: Mood normal.        Behavior: Behavior normal.        Thought Content: Thought content normal.        Judgment: Judgment normal.       Assessment & Plan:  1. Essential hypertension -Blood pressure not at goal.  Will increase lisinopril to 30 mg.  He was advised to monitor his blood pressures at home over the next 2 weeks, if BP not at goal then he will follow-up. return precautions reviewed - lisinopril (ZESTRIL) 30 MG tablet; Take 1 tablet (30 mg total) by mouth daily.  Dispense: 90 tablet; Refill: 1  [name], NP

## 2018-08-14 NOTE — Telephone Encounter (Signed)
Denied.  Filled earlier today in [place]. Nothing further needed.

## 2018-09-19 ENCOUNTER — Other Ambulatory Visit: Payer: Self-pay | Admitting: Psychiatry

## 2018-10-16 ENCOUNTER — Other Ambulatory Visit: Payer: Self-pay | Admitting: Adult Health

## 2018-10-16 ENCOUNTER — Other Ambulatory Visit: Payer: Self-pay

## 2018-10-16 ENCOUNTER — Ambulatory Visit (INDEPENDENT_AMBULATORY_CARE_PROVIDER_SITE_OTHER): Payer: Medicare Other | Admitting: Adult Health

## 2018-10-16 ENCOUNTER — Encounter: Payer: Self-pay | Admitting: Adult Health

## 2018-10-16 DIAGNOSIS — I10 Essential (primary) hypertension: Secondary | ICD-10-CM

## 2018-10-16 MED ORDER — LISINOPRIL 20 MG PO TABS: 20.0000 mg | ORAL_TABLET | Freq: Every day | ORAL | 1 refills | Status: DC

## 2018-10-16 NOTE — Progress Notes (Signed)
Virtual Visit via Telephone Note  I connected with [name] on 10/16/18 at  3:00 PM EDT by telephone and verified that I am speaking with the correct person using two identifiers.   I discussed the limitations, risks, security and privacy concerns of performing an evaluation and management service by telephone and the availability of in person appointments. I also discussed with the patient that there may be a patient responsible charge related to this service. The patient expressed understanding and agreed to proceed.  Location patient: home Location provider: work or home office Participants present for the call: patient, provider Patient did not have a visit in the prior 7 days to address this/these issue(s).   History of Present Illness: T53-year-old male who is being evaluated today for follow-up regarding hypertension.  He was last seen in May 2020 at which time he was prescribed lisinopril 10 mg for hypertension.  His blood pressure was 144/80.  He was advised to increase lisinopril to 30 mg for  better blood pressure control.  He reports when he did this he started to feel lightheaded and dizzy.  He eventually ran out of medication and has been out for approximately 1 month.  He failed to follow-up and now he has been checking his blood pressure and his most recent was 170/118.  Reports that he does have blood pressures in the 160s over 90s.  Denies symptoms at this time   Observations/Objective: Patient sounds cheerful and well on the phone. I do not appreciate any SOB. Speech and thought processing are grossly intact. Patient reported vitals:  Assessment and Plan: 1. Essential hypertension -We will have him take lisinopril 20 mg tablets.  He was advised to follow-up in 2 weeks for blood pressure evaluation.  He was also advised to follow-up sooner if he is symptomatic - lisinopril (ZESTRIL) 20 MG tablet; Take 1 tablet (20 mg total) by mouth daily.  Dispense: 30 tablet; Refill:  1   Follow Up Instructions:    I did not refer this patient for an OV in the next 24 hours for this/these issue(s).  I discussed the assessment and treatment plan with the patient. The patient was provided an opportunity to ask questions and all were answered. The patient agreed with the plan and demonstrated an understanding of the instructions.   The patient was advised to call back or seek an in-person evaluation if the symptoms worsen or if the condition fails to improve as anticipated.  I provided 15 minutes of non-face-to-face time during this encounter.   [name], NP

## 2018-10-17 NOTE — Telephone Encounter (Signed)
DENIED.  FILLED ON 10/16/2018

## 2018-10-23 ENCOUNTER — Telehealth: Payer: Self-pay | Admitting: Psychiatry

## 2018-10-23 NOTE — Telephone Encounter (Signed)
Patient called and said that he has an appt in September but he is not doing well. He is having very high anxiety and it is hard to function. He is taking his meds but thinks he might need something else added. Please give him a call at [phone]

## 2018-10-24 NOTE — Telephone Encounter (Signed)
Put Ron Agee on 30 min cancellation list.  I could see him Wednesday at 10:00

## 2018-10-25 NOTE — Telephone Encounter (Signed)
Patient scheduled for this Friday, August 7th

## 2018-10-26 ENCOUNTER — Encounter: Payer: Self-pay | Admitting: Psychiatry

## 2018-10-26 ENCOUNTER — Ambulatory Visit (INDEPENDENT_AMBULATORY_CARE_PROVIDER_SITE_OTHER): Payer: Medicare Other | Admitting: Psychiatry

## 2018-10-26 ENCOUNTER — Other Ambulatory Visit: Payer: Self-pay

## 2018-10-26 DIAGNOSIS — F411 Generalized anxiety disorder: Secondary | ICD-10-CM | POA: Diagnosis not present

## 2018-10-26 DIAGNOSIS — G4733 Obstructive sleep apnea (adult) (pediatric): Secondary | ICD-10-CM | POA: Diagnosis not present

## 2018-10-26 DIAGNOSIS — F324 Major depressive disorder, single episode, in partial remission: Secondary | ICD-10-CM

## 2018-10-26 DIAGNOSIS — F1024 Alcohol dependence with alcohol-induced mood disorder: Secondary | ICD-10-CM | POA: Diagnosis not present

## 2018-10-26 MED ORDER — OLANZAPINE 7.5 MG PO TABS: 7.5000 mg | ORAL_TABLET | Freq: Every day | ORAL | 1 refills | Status: DC

## 2018-10-26 MED ORDER — PAROXETINE HCL 40 MG PO TABS: 40.0000 mg | ORAL_TABLET | Freq: Every day | ORAL | 0 refills | Status: DC

## 2018-10-26 NOTE — Patient Instructions (Signed)
Increase olanzapine to 1 - 1/2  Of the 5 mg tablets or 7.5 mg each evening about 7:30pm

## 2018-10-26 NOTE — Progress Notes (Signed)
Jonathon Walker [id number] 1965-07-18 53 y.o.  Subjective:   Patient ID:  Jonathon Walker is a 53 y.o. (DOB 14-Oct-1965) male.  Chief Complaint:  Chief Complaint  Patient presents with  . Follow-up    med managment  . Anxiety    Urgent appointment    HPI   Jonathon Walker presents to the office today for follow-up of anxiety , depression, alcohol.  Jonathon Walker also requested this appointment urgently because of unmanaged anxiety.  Last seen April 18, 2018.  No meds were changed.  Stressed.  Still anxious.  Varies.  Tried second hand vape CBD and ut calmed him.  Lets him know Jonathon Walker's anxious.  Get overwhelmed easily.  Embarrassment of maybe being alcoholic and not being able to socialize the way Jonathon Walker did before.  Ruminates on this.  Not over Covid.  Attending AA and going to counselor but hasn't stopped drinking yet.  Overwhelms me.  Drinks beer to relax in the afternoon but all day will be stressed out about it.  Overwhelmed with potential lifestyle and social change if Jonathon Walker stops drinking.  Depression still better with Paxil. Pleased with it.   Nerves shot with alcohol.  Shakes.   Less benefit from olanzapine for sleep.  Takes at 7 pm.  8 hours of sleep.  NM nightly that Jonathon Walker can't get things accomplished.  Don't awaken him.  Olanzapine helped tremendously.  Don't crave it but if drinks 2, Jonathon Walker'll drink too many.  Starts drinking 4-5 pm.  Occ skips days.  Not shakey on the days Jonathon Walker skips.  So overwhelmed with anxiety and not calmed by a beer.  Otherwise thinks Jonathon Walker'd be ok if could control the alcohol.  The drinking is also in response to anxiety.  Ruminates on drinking and the social aspect of it bc doesn't see a solution.  Overwhelmed Jonathon Walker may never be able to control the alcohol.   No other major stressors.  Past psych meds:  Paxil 60 "too much",  Buspar stopped DT little effect. Xanax, duloxetine, Wellbutrin, Zoloft, Abilify, Rexulti. Viibryd 60. Olanzapine 5.   Review of Systems:  Review of Systems   Neurological: Negative for tremors and weakness.  Psychiatric/Behavioral: Negative for agitation, behavioral problems, confusion, decreased concentration, dysphoric mood, hallucinations, self-injury, sleep disturbance and suicidal ideas. The patient is nervous/anxious. The patient is not hyperactive.     Medications: I have reviewed the patient's current medications.  Current Outpatient Medications  Medication Sig Dispense Refill  . atorvastatin (LIPITOR) 40 MG tablet Take 1 tablet (40 mg total) by mouth daily. 90 tablet 3  . lisinopril (ZESTRIL) 20 MG tablet Take 1 tablet (20 mg total) by mouth daily. 30 tablet 1  . OLANZapine (ZYPREXA) 5 MG tablet TAKE 1 TABLET(5 MG) BY MOUTH AT BEDTIME 90 tablet 0  . PARoxetine (PAXIL) 40 MG tablet Take 1 tablet (40 mg total) by mouth daily. 90 tablet 0  . OLANZapine (ZYPREXA) 7.5 MG tablet Take 1 tablet (7.5 mg total) by mouth at bedtime. 30 tablet 1   No current facility-administered medications for this visit.     Medication Side Effects: sexual  Allergies: No Known Allergies  Past Medical History:  Diagnosis Date  . Anxiety   . Bimalleolar fracture of left ankle   . Depression   . Sleep apnea    uses CPAP nightly    Family History  Problem Relation Age of Onset  . Cancer Maternal Grandfather     Social History   Socioeconomic History  .  Marital status: Single    Spouse name: Not on file  . Number of children: Not on file  . Years of education: Not on file  . Highest education level: Not on file  Occupational History  . Not on file  Social Needs  . Financial resource strain: Not on file  . Food insecurity    Worry: Not on file    Inability: Not on file  . Transportation needs    Medical: Not on file    Non-medical: Not on file  Tobacco Use  . Smoking status: Current Every Day Smoker    Packs/day: 1.50    Years: 3.00    Pack years: 4.50    Types: Cigarettes  . Smokeless tobacco: Never Used  Substance and Sexual  Activity  . Alcohol use: Yes    Comment: drinks 12 beers daily  . Drug use: No  . Sexual activity: Not on file  Lifestyle  . Physical activity    Days per week: Not on file    Minutes per session: Not on file  . Stress: Not on file  Relationships  . Social Herbalist on phone: Not on file    Gets together: Not on file    Attends religious service: Not on file    Active member of club or organization: Not on file    Attends meetings of clubs or organizations: Not on file    Relationship status: Not on file  . Intimate partner violence    Fear of current or ex partner: Not on file    Emotionally abused: Not on file    Physically abused: Not on file    Forced sexual activity: Not on file  Other Topics Concern  . Not on file  Social History Narrative  . Not on file    Past Medical History, Surgical history, Social history, and Family history were reviewed and updated as appropriate.   Please see review of systems for further details on the patient's review from today.   Objective:   Physical Exam:  There were no vitals taken for this visit.  Physical Exam Constitutional:      General: Jonathon Walker is not in acute distress.    Appearance: Jonathon Walker is well-developed.     Comments: Red face  Musculoskeletal:        General: No deformity.  Neurological:     Mental Status: Jonathon Walker is alert and oriented to person, place, and time.     Motor: No tremor.     Coordination: Coordination normal.     Gait: Gait normal.  Psychiatric:        Attention and Perception: Attention normal. Jonathon Walker is attentive.        Mood and Affect: Mood is anxious. Mood is not depressed. Affect is not labile, blunt, angry or inappropriate.        Speech: Speech normal.        Behavior: Behavior normal.        Thought Content: Thought content normal. Thought content does not include homicidal or suicidal ideation. Thought content does not include homicidal or suicidal plan.        Cognition and Memory: Cognition  normal.     Comments: Insight and judgment fair. Anxiety not managed.     Lab Review:     Component Value Date/Time   NA 139 05/31/2018 1100   K 4.4 05/31/2018 1100   CL 102 05/31/2018 1100   CO2 27 05/31/2018 1100  GLUCOSE 103 (H) 05/31/2018 1100   BUN 12 05/31/2018 1100   CREATININE 1.14 05/31/2018 1100   CALCIUM 9.8 05/31/2018 1100   PROT 7.6 05/31/2018 1100   ALBUMIN 4.6 05/31/2018 1100   AST 23 05/31/2018 1100   ALT 19 05/31/2018 1100   ALKPHOS 79 05/31/2018 1100   BILITOT 0.6 05/31/2018 1100       Component Value Date/Time   WBC 5.2 05/31/2018 1100   RBC 5.08 05/31/2018 1100   HGB 17.0 05/31/2018 1100   HGB 15.2 05/05/2010 1049   HCT 48.4 05/31/2018 1100   HCT 44.8 05/05/2010 1049   PLT 236.0 05/31/2018 1100   PLT 193 05/05/2010 1049   MCV 95.3 05/31/2018 1100   MCV 89.6 05/05/2010 1049   MCH 30.4 05/05/2010 1049   MCHC 35.1 05/31/2018 1100   RDW 12.7 05/31/2018 1100   RDW 13.1 05/05/2010 1049   LYMPHSABS 1.4 05/31/2018 1100   LYMPHSABS 1.8 05/05/2010 1049   MONOABS 0.4 05/31/2018 1100   MONOABS 0.4 05/05/2010 1049   EOSABS 0.2 05/31/2018 1100   EOSABS 0.2 05/05/2010 1049   BASOSABS 0.1 05/31/2018 1100   BASOSABS 0.0 05/05/2010 1049    No results found for: POCLITH, LITHIUM   No results found for: PHENYTOIN, PHENOBARB, VALPROATE, CBMZ   .res Assessment: Plan:     Jonathon Walker was seen today for follow-up and anxiety.  Diagnoses and all orders for this visit:  GAD (generalized anxiety disorder) -     OLANZapine (ZYPREXA) 7.5 MG tablet; Take 1 tablet (7.5 mg total) by mouth at bedtime. -     PARoxetine (PAXIL) 40 MG tablet; Take 1 tablet (40 mg total) by mouth daily.  Alcohol dependence with alcohol-induced mood disorder ([place])  Major depressive disorder with single episode, in partial remission ([facility]) -     OLANZapine (ZYPREXA) 7.5 MG tablet; Take 1 tablet (7.5 mg total) by mouth at bedtime. -     PARoxetine (PAXIL) 40 MG tablet; Take 1 tablet (40 mg  total) by mouth daily.  OSA (obstructive sleep apnea)   Patient's anxiety is not managed.  It contributes to his ongoing alcohol abuse as well.  Jonathon Walker is acknowledging an alcohol problem.  Jonathon Walker asks about CBD.  Recommend against CBD because of not being sure what the ingredients actually are and Jonathon Walker may be getting THC.  As noted patient has failed several medications including BuSpar.  Jonathon Walker did not tolerate a higher dose of paroxetine very well.  It is unlikely that changing an SSRI will improve his anxiety management over the current paroxetine.  Therefore consider other options.  Consider gabapentin for anxiety and alcohol abuse off label.  Option increase olanzapine.   Option increase Zyprexa for rumination and sleep and severe anxiety and rumination.  Yes to 7.5mg .  . Discussed potential metabolic side effects associated with atypical antipsychotics, as well as potential risk for movement side effects. Advised pt to contact office if movement side effects occur.  Jonathon Walker agrees.  Call if you have any problems with this.  Increase olanzapine as the easiest option bc not introducing another med. Increase olanzapine to 1 - 1/2  Of the 5 mg tablets or 7.5 mg each evening about 7:30pm   Continue CPAP use.  Continue AA and counseling for alcohol and anxiety problems.  Benefit from Paxil.  Did not feel well at 60 mg so we will continue 40 mg.  This appt was 30 mins.   FU 1 mos  Lynder Parents, MD, DFAPA  Please see After Visit Summary for patient specific instructions.  Future Appointments  Date Time Provider [place]  11/28/2018  1:30 PM Cottle, Billey Co., MD [facility] None    No orders of the defined types were placed in this encounter.     -------------------------------

## 2018-11-09 ENCOUNTER — Other Ambulatory Visit: Payer: Self-pay | Admitting: Psychiatry

## 2018-11-09 DIAGNOSIS — F324 Major depressive disorder, single episode, in partial remission: Secondary | ICD-10-CM

## 2018-11-09 DIAGNOSIS — F411 Generalized anxiety disorder: Secondary | ICD-10-CM

## 2018-11-22 ENCOUNTER — Telehealth: Payer: Self-pay | Admitting: Family Medicine

## 2018-11-22 NOTE — Telephone Encounter (Signed)
Spoke to the pt.  He reports bp is running 170s/100s since medication was reduced at last visit.  He reports being under "an extreme amount of stress."  He states that he takes his lisinopril once daily every day without any missed doses.  He was scheduled for next Thursday but moved him up to Tuesday.  He has headaches.  Will forward to [facility] for medication recommendations.

## 2018-11-22 NOTE — Telephone Encounter (Signed)
Go ahead and take two tablets ( 40 mg total) until seen next week

## 2018-11-22 NOTE — Telephone Encounter (Signed)
Pt notified to take 2 tabs of lisinopril 20 mg until appointment next week.  Confirmed with the pt that he has enough medication to last until that appointment.  Nothing further needed.

## 2018-11-27 ENCOUNTER — Telehealth (INDEPENDENT_AMBULATORY_CARE_PROVIDER_SITE_OTHER): Payer: Medicare Other | Admitting: Adult Health

## 2018-11-27 ENCOUNTER — Other Ambulatory Visit: Payer: Self-pay | Admitting: Adult Health

## 2018-11-27 ENCOUNTER — Other Ambulatory Visit: Payer: Self-pay

## 2018-11-27 DIAGNOSIS — I10 Essential (primary) hypertension: Secondary | ICD-10-CM

## 2018-11-27 MED ORDER — AMLODIPINE BESYLATE 2.5 MG PO TABS: 2.5000 mg | ORAL_TABLET | Freq: Every day | ORAL | 0 refills | Status: DC

## 2018-11-27 NOTE — Telephone Encounter (Signed)
Denied.  Filled earlier today during visit.

## 2018-11-27 NOTE — Progress Notes (Signed)
Virtual Visit via Telephone Note  I connected with Jonathon Walker on 11/27/18 at  1:30 PM EDT by telephone and verified that I am speaking with the correct person using two identifiers.   I discussed the limitations, risks, security and privacy concerns of performing an evaluation and management service by telephone and the availability of in person appointments. I also discussed with the patient that there may be a patient responsible charge related to this service. The patient expressed understanding and agreed to proceed.  Location patient: home Location provider: work or home office Participants present for the call: patient, provider Patient did not have a visit in the prior 7 days to address this/these issue(s).   History of Present Illness: 53 year old male who is being evaluated today for follow-up regarding hypertension.  Back in the spring of  2020 he was started on lisinopril 10 mg, and a few weeks later this was increased to 30 mg as blood pressure was not at goal.  With the increase he started to feel lightheaded and dizzy.  He did not follow-up and eventually ended up running out of medication.  Was then started on lisinopril 20 mg has a thought that the 30 mg tablets too strong for him.  The last few weeks he has been monitoring his blood pressure and reports readings in the 170s over 100s.  Over the weekend he was encouraged to increase his lisinopril to 40 mg, which he did and reports readings in the 160s to 190s over 90s.  He denies headaches at this time nor does he have lightheadedness or dizziness.  He does report a lot of increased stress over the last month.     Observations/Objective: Patient sounds cheerful and well on the phone. I do not appreciate any SOB. Speech and thought processing are grossly intact. Patient reported vitals:  Assessment and Plan: 1. Essential hypertension -Since his blood pressure cuff is old and has been sitting on a box for multiple years I  am going to have him get a new cuff.  We will add on Norvasc 2.5 mg and have him follow-up in 2 weeks.  Return precautions were reviewed with the patient - amLODipine (NORVASC) 2.5 MG tablet; Take 1 tablet (2.5 mg total) by mouth daily.  Dispense: 30 tablet; Refill: 0    Follow Up Instructions:  I did not refer this patient for an OV in the next 24 hours for this/these issue(s).  I discussed the assessment and treatment plan with the patient. The patient was provided an opportunity to ask questions and all were answered. The patient agreed with the plan and demonstrated an understanding of the instructions.   The patient was advised to call back or seek an in-person evaluation if the symptoms worsen or if the condition fails to improve as anticipated.  I provided 15 minutes of non-face-to-face time during this encounter.   [name], NP

## 2018-11-28 ENCOUNTER — Ambulatory Visit: Payer: Medicare Other | Admitting: Psychiatry

## 2018-11-28 ENCOUNTER — Encounter

## 2018-11-28 ENCOUNTER — Ambulatory Visit: Payer: Medicare Other | Admitting: Adult Health

## 2018-11-28 ENCOUNTER — Telehealth: Payer: Self-pay | Admitting: Family Medicine

## 2018-11-28 NOTE — Telephone Encounter (Signed)
Ok to start taking Norvasc 2.5 mg. His BP is still high

## 2018-11-28 NOTE — Telephone Encounter (Signed)
Spoke to the pt.  He says he had been taking his blood pressure wrong.  BP running 150s/100.  Originally told Tommi Rumps it was running in the 180s or 190s.  He wants to know if he should still take the new prescription as prescribed.  He has not picked it up yet.

## 2018-11-28 NOTE — Telephone Encounter (Signed)
Pt notified to take medication.  Nothing further needed.

## 2018-12-04 ENCOUNTER — Other Ambulatory Visit: Payer: Self-pay | Admitting: Adult Health

## 2018-12-04 DIAGNOSIS — I10 Essential (primary) hypertension: Secondary | ICD-10-CM

## 2018-12-17 ENCOUNTER — Other Ambulatory Visit: Payer: Self-pay

## 2018-12-17 ENCOUNTER — Encounter: Payer: Self-pay | Admitting: Psychiatry

## 2018-12-17 ENCOUNTER — Ambulatory Visit (INDEPENDENT_AMBULATORY_CARE_PROVIDER_SITE_OTHER): Payer: Medicare Other | Admitting: Psychiatry

## 2018-12-17 DIAGNOSIS — G4733 Obstructive sleep apnea (adult) (pediatric): Secondary | ICD-10-CM

## 2018-12-17 DIAGNOSIS — F324 Major depressive disorder, single episode, in partial remission: Secondary | ICD-10-CM | POA: Diagnosis not present

## 2018-12-17 DIAGNOSIS — F411 Generalized anxiety disorder: Secondary | ICD-10-CM

## 2018-12-17 DIAGNOSIS — F1024 Alcohol dependence with alcohol-induced mood disorder: Secondary | ICD-10-CM | POA: Diagnosis not present

## 2018-12-17 NOTE — Progress Notes (Signed)
[name] [id number] [birth year] 53 y.o.  Subjective:   Patient ID:  Jonathon Walker is a 53 y.o. (DOB March 08, 1966) male.  Chief Complaint:  Chief Complaint  Patient presents with  . Anxiety    med changes    HPI   Jonathon Walker presents to the office today for follow-up of anxiety , depression, alcohol.  He also requested this appointment urgently because of unmanaged anxiety.  Last seen August 7.  His anxiety was unmanaged at Paxil 40 mg and olanzapine 5 mg so olanzapine was increased to 7.5 daily.  Stressed.  Still anxious.  Didn't help to increase the olanzapine.  Thinks it's situational with same stressors.  Embarrassment and shame of maybe being alcoholic and not being able to socialize the way he did before.  Ruminates on this.  Not over Covid.  Attending AA and going to counselor but hasn't stopped drinking yet.  Overwhelms me.  Drinks beer to relax in the afternoon but all day will be stressed out about it.  Overwhelmed with potential lifestyle and social change if he stops drinking.  Depression still better with Paxil. Pleased with it.   Nerves shot with alcohol.  Shakes.   Less benefit from olanzapine for sleep.  Takes at 7 pm.  8 hours of sleep.  NM nightly that he can't get things accomplished.  Don't awaken him. Chronic worry since childhood.  Anxiety interferes with concentration.  Paralysis of analysis and has nightly NM of the same thing.  Don't crave it but if drinks 2, he'll drink too many.  Starts drinking 4-5 pm.  Occ skips days.  Not shakey on the days he skips.  So overwhelmed with anxiety and not calmed by a beer.  Otherwise thinks he'd be ok if could control the alcohol.  The drinking is also in response to anxiety.  Ruminates on drinking and the social aspect of it bc doesn't see a solution.  Overwhelmed he may never be able to control the alcohol.   No other major stressors.  Seeing [name] young for 4-5 mos in therapy.  Past psych meds:  Paxil 60 "too much",   Buspar stopped DT little effect. Xanax, duloxetine, Wellbutrin, Zoloft, Abilify, Rexulti. Viibryd 60. Olanzapine 7.5.   Review of Systems:  Review of Systems  Neurological: Negative for weakness.  Psychiatric/Behavioral: Negative for agitation, behavioral problems, confusion, decreased concentration, dysphoric mood, hallucinations, self-injury, sleep disturbance and suicidal ideas. The patient is nervous/anxious. The patient is not hyperactive.     Medications: I have reviewed the patient's current medications.  Current Outpatient Medications  Medication Sig Dispense Refill  . amLODipine (NORVASC) 2.5 MG tablet Take 1 tablet (2.5 mg total) by mouth daily. 30 tablet 0  . atorvastatin (LIPITOR) 40 MG tablet Take 1 tablet (40 mg total) by mouth daily. 90 tablet 3  . lisinopril (ZESTRIL) 20 MG tablet TAKE 1 TABLET(20 MG) BY MOUTH DAILY 90 tablet 0  . OLANZapine (ZYPREXA) 7.5 MG tablet Take 1 tablet (7.5 mg total) by mouth at bedtime. 30 tablet 1  . PARoxetine (PAXIL) 40 MG tablet Take 1 tablet (40 mg total) by mouth daily. 90 tablet 0  . OLANZapine (ZYPREXA) 5 MG tablet TAKE 1 TABLET(5 MG) BY MOUTH AT BEDTIME (Patient not taking: Reported on 12/17/2018) 90 tablet 0   No current facility-administered medications for this visit.     Medication Side Effects: sexual,  Allergies: No Known Allergies  Past Medical History:  Diagnosis Date  . Anxiety   .  Bimalleolar fracture of left ankle   . Depression   . Sleep apnea    uses CPAP nightly    Family History  Problem Relation Age of Onset  . Cancer Maternal Grandfather     Social History   Socioeconomic History  . Marital status: Single    Spouse name: Not on file  . Number of children: Not on file  . Years of education: Not on file  . Highest education level: Not on file  Occupational History  . Not on file  Social Needs  . Financial resource strain: Not on file  . Food insecurity    Worry: Not on file    Inability: Not on  file  . Transportation needs    Medical: Not on file    Non-medical: Not on file  Tobacco Use  . Smoking status: Current Every Day Smoker    Packs/day: 1.50    Years: 3.00    Pack years: 4.50    Types: Cigarettes  . Smokeless tobacco: Never Used  Substance and Sexual Activity  . Alcohol use: Yes    Comment: drinks 12 beers daily  . Drug use: No  . Sexual activity: Not on file  Lifestyle  . Physical activity    Days per week: Not on file    Minutes per session: Not on file  . Stress: Not on file  Relationships  . Social Herbalist on phone: Not on file    Gets together: Not on file    Attends religious service: Not on file    Active member of club or organization: Not on file    Attends meetings of clubs or organizations: Not on file    Relationship status: Not on file  . Intimate partner violence    Fear of current or ex partner: Not on file    Emotionally abused: Not on file    Physically abused: Not on file    Forced sexual activity: Not on file  Other Topics Concern  . Not on file  Social History Narrative  . Not on file    Past Medical History, Surgical history, Social history, and Family history were reviewed and updated as appropriate.   Please see review of systems for further details on the patient's review from today.   Objective:   Physical Exam:  There were no vitals taken for this visit.  Physical Exam Constitutional:      General: He is not in acute distress.    Appearance: He is well-developed.     Comments: Red face  Musculoskeletal:        General: No deformity.  Neurological:     Mental Status: He is alert and oriented to person, place, and time.     Motor: No tremor.     Coordination: Coordination normal.     Gait: Gait normal.  Psychiatric:        Attention and Perception: Attention normal. He is attentive.        Mood and Affect: Mood is anxious. Mood is not depressed. Affect is not labile, blunt, angry or inappropriate.         Speech: Speech normal.        Behavior: Behavior normal.        Thought Content: Thought content normal. Thought content does not include homicidal or suicidal ideation. Thought content does not include homicidal or suicidal plan.        Cognition and Memory: Cognition normal.  Comments: Insight and judgment fair. Anxiety not managed.     Lab Review:     Component Value Date/Time   NA 139 05/31/2018 1100   K 4.4 05/31/2018 1100   CL 102 05/31/2018 1100   CO2 27 05/31/2018 1100   GLUCOSE 103 (H) 05/31/2018 1100   BUN 12 05/31/2018 1100   CREATININE 1.14 05/31/2018 1100   CALCIUM 9.8 05/31/2018 1100   PROT 7.6 05/31/2018 1100   ALBUMIN 4.6 05/31/2018 1100   AST 23 05/31/2018 1100   ALT 19 05/31/2018 1100   ALKPHOS 79 05/31/2018 1100   BILITOT 0.6 05/31/2018 1100       Component Value Date/Time   WBC 5.2 05/31/2018 1100   RBC 5.08 05/31/2018 1100   HGB 17.0 05/31/2018 1100   HGB 15.2 05/05/2010 1049   HCT 48.4 05/31/2018 1100   HCT 44.8 05/05/2010 1049   PLT 236.0 05/31/2018 1100   PLT 193 05/05/2010 1049   MCV 95.3 05/31/2018 1100   MCV 89.6 05/05/2010 1049   MCH 30.4 05/05/2010 1049   MCHC 35.1 05/31/2018 1100   RDW 12.7 05/31/2018 1100   RDW 13.1 05/05/2010 1049   LYMPHSABS 1.4 05/31/2018 1100   LYMPHSABS 1.8 05/05/2010 1049   MONOABS 0.4 05/31/2018 1100   MONOABS 0.4 05/05/2010 1049   EOSABS 0.2 05/31/2018 1100   EOSABS 0.2 05/05/2010 1049   BASOSABS 0.1 05/31/2018 1100   BASOSABS 0.0 05/05/2010 1049    No results found for: POCLITH, LITHIUM   No results found for: PHENYTOIN, PHENOBARB, VALPROATE, CBMZ   .res Assessment: Plan:     Telesforo was seen today for anxiety.  Diagnoses and all orders for this visit:  GAD (generalized anxiety disorder)  Alcohol dependence with alcohol-induced mood disorder ([place])  Major depressive disorder with single episode, in partial remission ([facility])  OSA (obstructive sleep apnea)   Patient's anxiety is not  managed.  It contributes to his ongoing alcohol abuse as well.  He is acknowledging an alcohol problem.  He asks about CBD.  Recommend against CBD because of not being sure what the ingredients actually are and he may be getting THC.  As noted patient has failed several medications including BuSpar.  He did not tolerate a higher dose of paroxetine very well.  It is unlikely that changing an SSRI will improve his anxiety management over the current paroxetine.  Therefore consider other options.  Consider gabapentin for anxiety and alcohol abuse off label.  Option increase olanzapine.   Trial 1-1/2 of the 7.5 mg tablets of olanzapine for anxiety.  If it helps call back and will prescribe the 15 mg tablet  If it does not help anxiety anymore call back and we will drop the dose back to 5 mg.  Discussed potential metabolic side effects associated with atypical antipsychotics, as well as potential risk for movement side effects. Advised pt to contact office if movement side effects occur.  He agrees.  Call if you have any problems with this.  Consider doxazosin for NM.   Continue CPAP use.  Continue AA and counseling for alcohol and anxiety problems.  Benefit from Paxil.  Did not feel well at 60 mg so we will continue 40 mg.  This appt was 30 mins.   FU 1 mos  Lynder Parents, MD, DFAPA   Please see After Visit Summary for patient specific instructions.  No future appointments.  No orders of the defined types were placed in this encounter.     -------------------------------

## 2018-12-17 NOTE — Patient Instructions (Signed)
Trial 1-1/2 of the 7.5 mg tablets of olanzapine for anxiety.  If it helps call back and will prescribe the 15 mg tablet  If it does not help anxiety anymore call back and we will drop the dose back to 5 mg.

## 2018-12-25 ENCOUNTER — Telehealth: Payer: Self-pay | Admitting: Adult Health

## 2018-12-25 NOTE — Telephone Encounter (Signed)
Pt calling in with blood pressure readings. Pt states that he doesn't have dates of readings, only readings.  170/101 148/96

## 2018-12-25 NOTE — Telephone Encounter (Signed)
This does not help me much. I would like him to monitor his BP twice a day for two weeks and then send me the results

## 2018-12-25 NOTE — Telephone Encounter (Signed)
Called and spoke to [place].  Advised of the below message.  He will contact Tommi Rumps again in 2 weeks.  Nothing further needed.

## 2018-12-28 ENCOUNTER — Other Ambulatory Visit: Payer: Self-pay | Admitting: Adult Health

## 2018-12-28 DIAGNOSIS — I10 Essential (primary) hypertension: Secondary | ICD-10-CM

## 2019-01-01 NOTE — Telephone Encounter (Signed)
30 days sent to the pharmacy.  Pt to follow up with BP readings.

## 2019-01-08 ENCOUNTER — Telehealth: Payer: Self-pay | Admitting: Psychiatry

## 2019-01-08 NOTE — Telephone Encounter (Signed)
Pt left vm saying that the dosage change for his Olanzapine is not working. He wants to know if there is anything else he can try for his anxiety.

## 2019-01-08 NOTE — Telephone Encounter (Signed)
At the last visit we increased olanzapine from 5 to 7.5 mg nightly.  He is indicating he is not satisfied with the results.  If he feels he is getting any benefit from the olanzapine he can drop the dose back to 5 mg daily.  If he feels he is getting no benefit from it at all he can discontinue it.  The next option we discussed at the visit was gabapentin.  This will be used off label for anxiety and may also help with alcohol craving he can start 300 mg 3 times a day.

## 2019-01-09 ENCOUNTER — Other Ambulatory Visit: Payer: Self-pay

## 2019-01-09 MED ORDER — GABAPENTIN 300 MG PO CAPS: 300.0000 mg | ORAL_CAPSULE | Freq: Three times a day (TID) | ORAL | 1 refills | Status: DC

## 2019-01-14 ENCOUNTER — Other Ambulatory Visit: Payer: Self-pay | Admitting: Psychiatry

## 2019-01-14 ENCOUNTER — Telehealth: Payer: Self-pay | Admitting: Psychiatry

## 2019-01-14 NOTE — Telephone Encounter (Signed)
Patient called and said that the medicine you gavave him last week he tried it and he didin't like it so he stopped it.He is still struggling with his anxiety and his fear and would like as different medicine. Please call him at [phone]

## 2019-01-14 NOTE — Telephone Encounter (Signed)
Actually it is not very clear from the last progress note which med he is unhappy with.  The plan was to increase olanzapine to 7.5 mg nightly for his anxiety.  If that fails he was going to try gabapentin.  Please ask him which made he took and what sort of effect it had.  Did not like it because of side effects?  Or did not like it because it did not work. ?

## 2019-01-14 NOTE — Telephone Encounter (Signed)
Patient states he does not like olanzapine 7.5 mg nightly.  Drop the dose back to 5 mg nightly.  We will start gabapentin for anxiety 300 mg 3 times daily.  If that is not helpful we can go higher in the dosage.  It can cause some sleepiness or tiredness prescription sent

## 2019-01-15 NOTE — Telephone Encounter (Signed)
I cannot prescribe lorazepam, alprazolam, clonazepam etc. to him because of his history of alcohol problems.  Those medications are too risky to use in patients with a history of alcohol problems.  He can go up further on the olanzapine to help anxiety more if he is tolerating it well that would be the best choice.  He is taking 7.5 mg daily and the maximum dosage is 20 mg daily.  If he is not excessively tired or sleepy he can increase the dosage to 10 or 15 mg of olanzapine and should get benefit fairly quickly from doing so.  That is my recommendation.

## 2019-01-23 ENCOUNTER — Telehealth: Payer: Self-pay | Admitting: Psychiatry

## 2019-01-23 ENCOUNTER — Other Ambulatory Visit: Payer: Self-pay

## 2019-01-23 MED ORDER — OLANZAPINE 10 MG PO TABS: 10.0000 mg | ORAL_TABLET | Freq: Every day | ORAL | 0 refills | Status: DC

## 2019-01-23 NOTE — Telephone Encounter (Signed)
See other phone message pt aware of Dr. Casimiro Needle recommendation

## 2019-01-23 NOTE — Telephone Encounter (Signed)
PT called back and I gave him the information from [employer]. He wishes to increase the olanzepine dose to help with anxiety. Please advise him and call in RX to [facility] on file.

## 2019-01-23 NOTE — Telephone Encounter (Signed)
Rtc to patient and instructed to take olanzapine 10 mg 1 q hs, if not helping anxiety can increase to 1.5 tablets (15 mg) q hs. Advised to call back if symptoms not improving. Updated Rx submitted to [facility] pharmacy

## 2019-01-30 ENCOUNTER — Telehealth: Payer: Self-pay | Admitting: Adult Health

## 2019-01-30 DIAGNOSIS — I10 Essential (primary) hypertension: Secondary | ICD-10-CM

## 2019-01-30 MED ORDER — LISINOPRIL 20 MG PO TABS: ORAL_TABLET | ORAL | 0 refills | Status: DC

## 2019-01-30 NOTE — Telephone Encounter (Signed)
rx refill lisinopril (ZESTRIL) 20 MG tablet  PHARMACY [facility] [id number] - [address], [place] AT [address] [phone] (Phone) [phone] (Fax)

## 2019-01-30 NOTE — Telephone Encounter (Signed)
Called and spoke to the pharmacy.  Advised that rx was sent in on 12/04/2018 for 90 days.  Pharmacy stated rx was "closed out."  Will send in a refill.

## 2019-02-06 ENCOUNTER — Other Ambulatory Visit: Payer: Self-pay | Admitting: Family Medicine

## 2019-02-06 ENCOUNTER — Ambulatory Visit: Payer: Medicare Other | Admitting: Adult Health

## 2019-02-06 ENCOUNTER — Other Ambulatory Visit: Payer: Self-pay

## 2019-02-06 DIAGNOSIS — I10 Essential (primary) hypertension: Secondary | ICD-10-CM

## 2019-02-06 MED ORDER — AMLODIPINE BESYLATE 2.5 MG PO TABS: ORAL_TABLET | ORAL | 0 refills | Status: DC

## 2019-02-12 ENCOUNTER — Ambulatory Visit: Payer: Medicare Other | Admitting: Psychiatry

## 2019-02-20 ENCOUNTER — Ambulatory Visit: Payer: Medicare Other | Admitting: Adult Health

## 2019-03-18 ENCOUNTER — Telehealth: Payer: Self-pay | Admitting: Psychiatry

## 2019-03-18 NOTE — Telephone Encounter (Signed)
Pt left voicemail stating he was in a 30 day program and was prescribed Wellbutrin. He stated Dr CC had prescribed Wellbutrin also. Pt stated he may be having some sort of reaction. He asked to be placed on the wait list for an earlier appt but would like a call back.

## 2019-03-20 NOTE — Telephone Encounter (Signed)
This is done.

## 2019-03-25 ENCOUNTER — Ambulatory Visit (INDEPENDENT_AMBULATORY_CARE_PROVIDER_SITE_OTHER): Payer: Medicare Other | Admitting: Psychiatry

## 2019-03-25 ENCOUNTER — Encounter: Payer: Self-pay | Admitting: Psychiatry

## 2019-03-25 ENCOUNTER — Other Ambulatory Visit: Payer: Self-pay

## 2019-03-25 DIAGNOSIS — F411 Generalized anxiety disorder: Secondary | ICD-10-CM

## 2019-03-25 DIAGNOSIS — F1024 Alcohol dependence with alcohol-induced mood disorder: Secondary | ICD-10-CM

## 2019-03-25 DIAGNOSIS — F324 Major depressive disorder, single episode, in partial remission: Secondary | ICD-10-CM | POA: Diagnosis not present

## 2019-03-25 DIAGNOSIS — G4733 Obstructive sleep apnea (adult) (pediatric): Secondary | ICD-10-CM

## 2019-03-25 NOTE — Patient Instructions (Signed)
Call back with the current dose of olanzapine.

## 2019-03-25 NOTE — Progress Notes (Signed)
Jonathon Walker [id number] 22-Jan-1966 54 y.o.  Subjective:   Patient ID:  Jonathon Walker is a 54 y.o. (DOB 25-Sep-1965) male.  Chief Complaint:  Chief Complaint  Patient presents with  . Follow-up    Medication Management and alcohol  . Anxiety    Medication Management  . Depression    Medication Management    Anxiety Patient reports no confusion, decreased concentration, nervous/anxious behavior or suicidal ideas.    Depression        Associated symptoms include no decreased concentration and no suicidal ideas.  Past medical history includes anxiety.      [name] presents to the office today for follow-up of anxiety , depression, alcohol.    seen August 7.  His anxiety was unmanaged at Paxil 40 mg and olanzapine 5 mg so olanzapine was increased to 7.5 daily.  Last seen December 17, 2018.  His anxiety was unmanaged.  The following change was made: Trial 1-1/2 of the 7.5 mg tablets of olanzapine for anxiety.  He called back a couple of occasions stating it was not helping.  He was advised to increase the olanzapine to 15 mg nightly and continue the paroxetine 40 mg daily since that is the maximum dosage of paroxetine he tolerates.  He is had a stay at [facility] for alcohol dependence since he was last here. Stayed 28 days and DC before Thanksgiving.  Very helpful.  Was having NM nighly before and they stopped since then.  Not drinking.  Involved in AA since then.  Overall feels about the same.  Added Wellbutrin for fatigue and sleepiness and it seemed to help.  Better than last month.  Still absent-minded and fatigue.  Lack of ambition.  Was irritable but resolved.  Rare brief craving alcohol.  Still on olanzapine but he doesn't know the dose.  Unemployed but plans on getting a job in a couple of weeks.  Anxiety is a lot better than it was.  Depression is not severe but not gone.  Still some social anxiety.    Getting enough sleep but a little less  rested.  Seeing [name] young for 4-5 mos in therapy.  Past psych meds:  Paxil 60 "too much",  Buspar stopped DT little effect. Xanax, duloxetine, Wellbutrin, Zoloft, Abilify, Rexulti. Viibryd 60. Olanzapine 10   Review of Systems:  Review of Systems  Neurological: Negative for weakness.  Psychiatric/Behavioral: Positive for depression and dysphoric mood. Negative for agitation, behavioral problems, confusion, decreased concentration, hallucinations, self-injury, sleep disturbance and suicidal ideas. The patient is not nervous/anxious and is not hyperactive.     Medications: I have reviewed the patient's current medications.  Current Outpatient Medications  Medication Sig Dispense Refill  . amLODipine (NORVASC) 2.5 MG tablet TAKE 1 TABLET(2.5 MG) BY MOUTH DAILY 30 tablet 0  . atorvastatin (LIPITOR) 40 MG tablet Take 1 tablet (40 mg total) by mouth daily. 90 tablet 3  . lisinopril (ZESTRIL) 20 MG tablet TAKE 1 TABLET(20 MG) BY MOUTH DAILY 90 tablet 0  . OLANZapine (ZYPREXA) 10 MG tablet Take 1 tablet (10 mg total) by mouth at bedtime. 90 tablet 0  . PARoxetine (PAXIL) 40 MG tablet Take 1 tablet (40 mg total) by mouth daily. 90 tablet 0   No current facility-administered medications for this visit.    Medication Side Effects: sexual,  Allergies: No Known Allergies  Past Medical History:  Diagnosis Date  . Anxiety   . Bimalleolar fracture of left ankle   . Depression   .  Sleep apnea    uses CPAP nightly    Family History  Problem Relation Age of Onset  . Cancer Maternal Grandfather     Social History   Socioeconomic History  . Marital status: Single    Spouse name: Not on file  . Number of children: Not on file  . Years of education: Not on file  . Highest education level: Not on file  Occupational History  . Not on file  Tobacco Use  . Smoking status: Current Every Day Smoker    Packs/day: 1.50    Years: 3.00    Pack years: 4.50    Types: Cigarettes  . Smokeless  tobacco: Never Used  Substance and Sexual Activity  . Alcohol use: Yes    Comment: drinks 12 beers daily  . Drug use: No  . Sexual activity: Not on file  Other Topics Concern  . Not on file  Social History Narrative  . Not on file   Social Determinants of Health   Financial Resource Strain:   . Difficulty of Paying Living Expenses: Not on file  Food Insecurity:   . Worried About Charity fundraiser in the Last Year: Not on file  . Ran Out of Food in the Last Year: Not on file  Transportation Needs:   . Lack of Transportation (Medical): Not on file  . Lack of Transportation (Non-Medical): Not on file  Physical Activity:   . Days of Exercise per Week: Not on file  . Minutes of Exercise per Session: Not on file  Stress:   . Feeling of Stress : Not on file  Social Connections:   . Frequency of Communication with Friends and Family: Not on file  . Frequency of Social Gatherings with Friends and Family: Not on file  . Attends Religious Services: Not on file  . Active Member of Clubs or Organizations: Not on file  . Attends Archivist Meetings: Not on file  . Marital Status: Not on file  Intimate Partner Violence:   . Fear of Current or Ex-Partner: Not on file  . Emotionally Abused: Not on file  . Physically Abused: Not on file  . Sexually Abused: Not on file    Past Medical History, Surgical history, Social history, and Family history were reviewed and updated as appropriate.   Please see review of systems for further details on the patient's review from today.   Objective:   Physical Exam:  There were no vitals taken for this visit.  Physical Exam Constitutional:      General: He is not in acute distress.    Appearance: Normal appearance. He is well-developed.     Comments: Red face  Musculoskeletal:        General: No deformity.  Neurological:     Mental Status: He is alert and oriented to person, place, and time.     Motor: No tremor.      Coordination: Coordination normal.     Gait: Gait normal.  Psychiatric:        Attention and Perception: Attention normal. He is attentive.        Mood and Affect: Mood is anxious and depressed. Affect is not labile, blunt, angry or inappropriate.        Speech: Speech normal.        Behavior: Behavior normal.        Thought Content: Thought content normal. Thought content does not include homicidal or suicidal ideation. Thought content does not include  homicidal or suicidal plan.        Cognition and Memory: Cognition normal.     Comments: Insight and judgment fair. Anxiety not gone but much better. Depression is getting better.     Lab Review:     Component Value Date/Time   NA 139 05/31/2018 1100   K 4.4 05/31/2018 1100   CL 102 05/31/2018 1100   CO2 27 05/31/2018 1100   GLUCOSE 103 (H) 05/31/2018 1100   BUN 12 05/31/2018 1100   CREATININE 1.14 05/31/2018 1100   CALCIUM 9.8 05/31/2018 1100   PROT 7.6 05/31/2018 1100   ALBUMIN 4.6 05/31/2018 1100   AST 23 05/31/2018 1100   ALT 19 05/31/2018 1100   ALKPHOS 79 05/31/2018 1100   BILITOT 0.6 05/31/2018 1100       Component Value Date/Time   WBC 5.2 05/31/2018 1100   RBC 5.08 05/31/2018 1100   HGB 17.0 05/31/2018 1100   HGB 15.2 05/05/2010 1049   HCT 48.4 05/31/2018 1100   HCT 44.8 05/05/2010 1049   PLT 236.0 05/31/2018 1100   PLT 193 05/05/2010 1049   MCV 95.3 05/31/2018 1100   MCV 89.6 05/05/2010 1049   MCH 30.4 05/05/2010 1049   MCHC 35.1 05/31/2018 1100   RDW 12.7 05/31/2018 1100   RDW 13.1 05/05/2010 1049   LYMPHSABS 1.4 05/31/2018 1100   LYMPHSABS 1.8 05/05/2010 1049   MONOABS 0.4 05/31/2018 1100   MONOABS 0.4 05/05/2010 1049   EOSABS 0.2 05/31/2018 1100   EOSABS 0.2 05/05/2010 1049   BASOSABS 0.1 05/31/2018 1100   BASOSABS 0.0 05/05/2010 1049    No results found for: POCLITH, LITHIUM   No results found for: PHENYTOIN, PHENOBARB, VALPROATE, CBMZ   .res Assessment: Plan:     Kapena was seen today  for follow-up, anxiety and depression.  Diagnoses and all orders for this visit:  Major depressive disorder with single episode, in partial remission ([place])  GAD (generalized anxiety disorder)  Alcohol dependence with alcohol-induced mood disorder ([facility])  OSA (obstructive sleep apnea)   Anxiety not gone but much better. Depression is getting better.  Tolerating Wellbutrin and it seems to help. He didn't tolerate the increase Still absent minded.  Anxiety is improved off alcohol.  Ok to reduce olanzapine to 7.5 mg daily.  Discussed potential metabolic side effects associated with atypical antipsychotics, as well as potential risk for movement side effects. Advised pt to contact office if movement side effects occur.  He agrees.  Call if you have any problems with this.  Continue CPAP use.  Continue AA and counseling for alcohol and anxiety problems.  Continue Paxil.  Did not feel well at 60 mg so we will continue 40 mg.  FU 1-2 mos  Lynder Parents, MD, DFAPA   Please see After Visit Summary for patient specific instructions.  No future appointments.  No orders of the defined types were placed in this encounter.     -------------------------------

## 2019-03-28 ENCOUNTER — Encounter: Payer: Self-pay | Admitting: Psychiatry

## 2019-03-28 MED ORDER — OLANZAPINE 7.5 MG PO TABS: 7.5000 mg | ORAL_TABLET | Freq: Every day | ORAL | 1 refills | Status: DC

## 2019-04-01 ENCOUNTER — Telehealth: Payer: Self-pay | Admitting: Psychiatry

## 2019-04-01 NOTE — Telephone Encounter (Signed)
Patient called and said that he needs a medicine change. His depression is worse he is starting a new job and he doesn't have an appt until march. Please give him a call at [phone]

## 2019-04-01 NOTE — Telephone Encounter (Signed)
As noted he was just seen 1 week ago.  The only med change was a reduction in olanzapine from 10 to 7.5 mg daily.  If he feels more depressed since that visit then he needs to increase the olanzapine back to 10 mg daily.  He has not tolerated efforts to increase paroxetine above the current dose nor did he tolerate Wellbutrin above the current dose.  So therefore I would recommend he increase olanzapine back to 10 mg daily.  If for some reason that is not acceptable then put him on the cancellation list for another appointment as any further med change will be complicated enough to require an appointment because he is maxed out the dosages of his current medicines.

## 2019-04-01 NOTE — Telephone Encounter (Signed)
Pt. Made aware and will try increasing the Olanzapine back up to 10 Mg.

## 2019-04-03 ENCOUNTER — Encounter: Payer: Self-pay | Admitting: Psychiatry

## 2019-04-03 ENCOUNTER — Ambulatory Visit (INDEPENDENT_AMBULATORY_CARE_PROVIDER_SITE_OTHER): Payer: Medicare Other | Admitting: Psychiatry

## 2019-04-03 ENCOUNTER — Other Ambulatory Visit: Payer: Self-pay

## 2019-04-03 DIAGNOSIS — F324 Major depressive disorder, single episode, in partial remission: Secondary | ICD-10-CM | POA: Diagnosis not present

## 2019-04-03 DIAGNOSIS — G4733 Obstructive sleep apnea (adult) (pediatric): Secondary | ICD-10-CM

## 2019-04-03 DIAGNOSIS — F1024 Alcohol dependence with alcohol-induced mood disorder: Secondary | ICD-10-CM

## 2019-04-03 DIAGNOSIS — F411 Generalized anxiety disorder: Secondary | ICD-10-CM

## 2019-04-03 MED ORDER — OLANZAPINE 5 MG PO TABS: 7.5000 mg | ORAL_TABLET | Freq: Every day | ORAL | 0 refills | Status: DC

## 2019-04-03 NOTE — Progress Notes (Signed)
JAD MAMONE [id number] Jul 23, 1965 54 y.o.  Subjective:   Patient ID:  Jonathon Walker is a 54 y.o. (DOB [birth year]) male.  Chief Complaint:  Chief Complaint  Patient presents with  . Follow-up    Medication Management  . Depression    Medication Management  . Anxiety    Medication Management  . cognitve problems  . Memory Loss    Anxiety Patient reports no confusion, decreased concentration, nervous/anxious behavior or suicidal ideas.    Depression        Associated symptoms include no decreased concentration and no suicidal ideas.  Past medical history includes anxiety.      Jonathon Walker presents to the office today for follow-up of anxiety , depression, alcohol.    seen August 7.  His anxiety was unmanaged at Paxil 40 mg and olanzapine 5 mg so olanzapine was increased to 7.5 daily.  seen December 17, 2018.  His anxiety was unmanaged.  The following change was made: Trial 1-1/2 of the 7.5 mg tablets of olanzapine for anxiety.  He is had a stay at [facility] for alcohol dependence since he was last here. Stayed 28 days and DC before Thanksgiving.  Very helpful.  Was having NM nighly before and they stopped since then.  Not drinking.  Involved in AA since then.  Last seen March 25, 2019.  The patient was doing better requested a reduction in olanzapine to 7.5 mg nightly to improve energy.  He called requesting this urgent appointment because of worsening symptoms associated with returning to work.   Says he's starting a new job and fears poor job performance.  Forgetful and low motivation.  Not bathing like he should and doing dishes.  Reduced olanzapine to 7.5 mg daily and maybe that helped.  Wonders about reducing further bc anxiety is better now that faced the drinking problem.  Remained sober and going to [employer].    Overall feels about the same.  Added Wellbutrin for fatigue and sleepiness and it seemed to help.  Better than last month.  Still  absent-minded and fatigue.  Lack of ambition.  Was irritable but resolved.  Rare brief craving alcohol.  Still on olanzapine but he doesn't know the dose.  Unemployed but plans on getting a job in a couple of weeks.  Anxiety is a lot better than it was.  Depression is not severe but not gone.  Still some social anxiety.    Getting enough sleep but a little less rested.  Seeing Jonathon Walker for 4-5 mos in therapy.  Past psych meds:  Paxil 60 "too much",  Buspar stopped DT little effect. Xanax, duloxetine, Wellbutrin, Zoloft, Abilify, Rexulti. Viibryd 60. Olanzapine 10   Review of Systems:  Review of Systems  Neurological: Negative for weakness.  Psychiatric/Behavioral: Positive for depression and dysphoric mood. Negative for agitation, behavioral problems, confusion, decreased concentration, hallucinations, self-injury, sleep disturbance and suicidal ideas. The patient is not nervous/anxious and is not hyperactive.     Medications: I have reviewed the patient's current medications.  Current Outpatient Medications  Medication Sig Dispense Refill  . amLODipine (NORVASC) 2.5 MG tablet TAKE 1 TABLET(2.5 MG) BY MOUTH DAILY (Patient taking differently: 5 mg. TAKE 1 TABLET(2.5 MG) BY MOUTH DAILY) 30 tablet 0  . atorvastatin (LIPITOR) 40 MG tablet Take 1 tablet (40 mg total) by mouth daily. 90 tablet 3  . buPROPion (WELLBUTRIN) 75 MG tablet Take 75 mg by mouth every morning.    [name] lisinopril (ZESTRIL) 20  MG tablet TAKE 1 TABLET(20 MG) BY MOUTH DAILY (Patient taking differently: 30 mg. TAKE 1 TABLET(20 MG) BY MOUTH DAILY) 90 tablet 0  . OLANZapine (ZYPREXA) 7.5 MG tablet Take 1 tablet (7.5 mg total) by mouth at bedtime. 30 tablet 1  . PARoxetine (PAXIL) 40 MG tablet Take 1 tablet (40 mg total) by mouth daily. 90 tablet 0   No current facility-administered medications for this visit.    Medication Side Effects: sexual,  Allergies: No Known Allergies  Past Medical History:  Diagnosis Date  .  Anxiety   . Bimalleolar fracture of left ankle   . Depression   . Sleep apnea    uses CPAP nightly    Family History  Problem Relation Age of Onset  . Cancer Maternal Grandfather     Social History   Socioeconomic History  . Marital status: Single    Spouse name: Not on file  . Number of children: Not on file  . Years of education: Not on file  . Highest education level: Not on file  Occupational History  . Not on file  Tobacco Use  . Smoking status: Current Every Day Smoker    Packs/day: 1.50    Years: 3.00    Pack years: 4.50    Types: Cigarettes  . Smokeless tobacco: Never Used  Substance and Sexual Activity  . Alcohol use: Yes    Comment: drinks 12 beers daily  . Drug use: No  . Sexual activity: Not on file  Other Topics Concern  . Not on file  Social History Narrative  . Not on file   Social Determinants of Health   Financial Resource Strain:   . Difficulty of Paying Living Expenses: Not on file  Food Insecurity:   . Worried About Charity fundraiser in the Last Year: Not on file  . Ran Out of Food in the Last Year: Not on file  Transportation Needs:   . Lack of Transportation (Medical): Not on file  . Lack of Transportation (Non-Medical): Not on file  Physical Activity:   . Days of Exercise per Week: Not on file  . Minutes of Exercise per Session: Not on file  Stress:   . Feeling of Stress : Not on file  Social Connections:   . Frequency of Communication with Friends and Family: Not on file  . Frequency of Social Gatherings with Friends and Family: Not on file  . Attends Religious Services: Not on file  . Active Member of Clubs or Organizations: Not on file  . Attends Archivist Meetings: Not on file  . Marital Status: Not on file  Intimate Partner Violence:   . Fear of Current or Ex-Partner: Not on file  . Emotionally Abused: Not on file  . Physically Abused: Not on file  . Sexually Abused: Not on file    Past Medical History,  Surgical history, Social history, and Family history were reviewed and updated as appropriate.   Please see review of systems for further details on the patient's review from today.   Objective:   Physical Exam:  There were no vitals taken for this visit.  Physical Exam Constitutional:      General: He is not in acute distress.    Appearance: Normal appearance. He is well-developed.     Comments: Red face  Musculoskeletal:        General: No deformity.  Neurological:     Mental Status: He is alert and oriented to person, place,  and time.     Motor: No tremor.     Coordination: Coordination normal.     Gait: Gait normal.  Psychiatric:        Attention and Perception: Attention normal. He is attentive.        Mood and Affect: Mood is anxious and depressed. Affect is not labile, blunt, angry or inappropriate.        Speech: Speech normal.        Behavior: Behavior normal.        Thought Content: Thought content normal. Thought content does not include homicidal or suicidal ideation. Thought content does not include homicidal or suicidal plan.        Cognition and Memory: Cognition normal.     Comments: Insight and judgment fair. Anxiety not gone but much better. Depression is getting better.     Lab Review:     Component Value Date/Time   NA 139 05/31/2018 1100   K 4.4 05/31/2018 1100   CL 102 05/31/2018 1100   CO2 27 05/31/2018 1100   GLUCOSE 103 (H) 05/31/2018 1100   BUN 12 05/31/2018 1100   CREATININE 1.14 05/31/2018 1100   CALCIUM 9.8 05/31/2018 1100   PROT 7.6 05/31/2018 1100   ALBUMIN 4.6 05/31/2018 1100   AST 23 05/31/2018 1100   ALT 19 05/31/2018 1100   ALKPHOS 79 05/31/2018 1100   BILITOT 0.6 05/31/2018 1100       Component Value Date/Time   WBC 5.2 05/31/2018 1100   RBC 5.08 05/31/2018 1100   HGB 17.0 05/31/2018 1100   HGB 15.2 05/05/2010 1049   HCT 48.4 05/31/2018 1100   HCT 44.8 05/05/2010 1049   PLT 236.0 05/31/2018 1100   PLT 193 05/05/2010  1049   MCV 95.3 05/31/2018 1100   MCV 89.6 05/05/2010 1049   MCH 30.4 05/05/2010 1049   MCHC 35.1 05/31/2018 1100   RDW 12.7 05/31/2018 1100   RDW 13.1 05/05/2010 1049   LYMPHSABS 1.4 05/31/2018 1100   LYMPHSABS 1.8 05/05/2010 1049   MONOABS 0.4 05/31/2018 1100   MONOABS 0.4 05/05/2010 1049   EOSABS 0.2 05/31/2018 1100   EOSABS 0.2 05/05/2010 1049   BASOSABS 0.1 05/31/2018 1100   BASOSABS 0.0 05/05/2010 1049    No results found for: POCLITH, LITHIUM   No results found for: PHENYTOIN, PHENOBARB, VALPROATE, CBMZ   .res Assessment: Plan:     Jonathon Walker was seen today for follow-up, depression, anxiety, cognitve problems and memory loss.  Diagnoses and all orders for this visit:  Alcohol dependence with alcohol-induced mood disorder ([place])  Major depressive disorder with single episode, in partial remission ([place])  GAD (generalized anxiety disorder)  OSA (obstructive sleep apnea)   Anxiety not gone but much better.  Worries about job performance with the new job.  He gets easily overwhelmed still.  Complains that motivation is not as good as it should be.  We discussed that his cognitive problems could be related to chronic alcohol use and alcohols interference with B vitamin absorption. Depression is getting better.  Tolerating Wellbutrin and it seems to help. He didn't tolerate the increase Still absent minded.  Anxiety is improved off alcohol.  Ok to reduce olanzapine to 5 mg daily at next refill also to be sure low motivation is not related..  Discussed potential metabolic side effects associated with atypical antipsychotics, as well as potential risk for movement side effects. Advised pt to contact office if movement side effects occur.  He agrees.  Call if  you have any problems with this.  Continue CPAP use.  Start B Complex vitamin Consider checking vitamin levels for cognitive problems if they persist.  Disc how this can relate to alcohol dependence.  Continue AA and  counseling for alcohol and anxiety problems.  Continue Paxil.  Did not feel well at 60 mg so we will continue 40 mg.  FU 1-2 mos  Lynder Parents, MD, DFAPA   Please see After Visit Summary for patient specific instructions.  Future Appointments  Date Time Provider [place]  05/21/2019  2:00 PM Cottle, Billey Co., MD [facility] None    No orders of the defined types were placed in this encounter.     -------------------------------

## 2019-04-03 NOTE — Patient Instructions (Signed)
B complex vitamin daily

## 2019-04-11 ENCOUNTER — Ambulatory Visit: Payer: Medicare Other | Admitting: Psychiatry

## 2019-04-22 ENCOUNTER — Telehealth: Payer: Self-pay | Admitting: Psychiatry

## 2019-04-22 ENCOUNTER — Other Ambulatory Visit: Payer: Self-pay

## 2019-04-22 ENCOUNTER — Telehealth: Payer: Self-pay | Admitting: Adult Health

## 2019-04-22 MED ORDER — BUPROPION HCL 75 MG PO TABS: 75.0000 mg | ORAL_TABLET | ORAL | 0 refills | Status: DC

## 2019-04-22 NOTE — Telephone Encounter (Signed)
Patient is requesting refill on all of his BP medicines and his calcium.  Patient is out of all of his BP medicine.  He states the pharmacy has been trying to contact the office for a refill for a week.  Please give the patient a call back.

## 2019-04-22 NOTE — Telephone Encounter (Signed)
Jonathon Walker states that the pharmacy was supposed to send a rf request for Wellbutrin, but they haven't. Please send in a RF for 75mg  to [facility] on file in chart.

## 2019-04-23 ENCOUNTER — Other Ambulatory Visit: Payer: Self-pay

## 2019-04-23 DIAGNOSIS — I10 Essential (primary) hypertension: Secondary | ICD-10-CM

## 2019-04-23 MED ORDER — AMLODIPINE BESYLATE 2.5 MG PO TABS: ORAL_TABLET | ORAL | 0 refills | Status: DC

## 2019-04-23 MED ORDER — ATORVASTATIN CALCIUM 40 MG PO TABS: 40.0000 mg | ORAL_TABLET | Freq: Every day | ORAL | 0 refills | Status: DC

## 2019-04-23 MED ORDER — LISINOPRIL 20 MG PO TABS: ORAL_TABLET | ORAL | 0 refills | Status: DC

## 2019-04-23 NOTE — Telephone Encounter (Signed)
Left message to return phone call. Pt needs to schedule a CPE for more refills.

## 2019-04-23 NOTE — Telephone Encounter (Signed)
Pt has been scheduled Rx will be refilled for tep supply.

## 2019-05-21 ENCOUNTER — Other Ambulatory Visit: Payer: Self-pay

## 2019-05-21 ENCOUNTER — Encounter: Payer: Self-pay | Admitting: Psychiatry

## 2019-05-21 ENCOUNTER — Ambulatory Visit (INDEPENDENT_AMBULATORY_CARE_PROVIDER_SITE_OTHER): Payer: Medicare Other | Admitting: Psychiatry

## 2019-05-21 DIAGNOSIS — F411 Generalized anxiety disorder: Secondary | ICD-10-CM

## 2019-05-21 DIAGNOSIS — F1024 Alcohol dependence with alcohol-induced mood disorder: Secondary | ICD-10-CM

## 2019-05-21 DIAGNOSIS — F324 Major depressive disorder, single episode, in partial remission: Secondary | ICD-10-CM | POA: Diagnosis not present

## 2019-05-21 DIAGNOSIS — G4733 Obstructive sleep apnea (adult) (pediatric): Secondary | ICD-10-CM | POA: Diagnosis not present

## 2019-05-21 NOTE — Patient Instructions (Signed)
Reduce olanzapine to one half of a 5 mg tablet for 4 weeks and then stop it

## 2019-05-21 NOTE — Progress Notes (Signed)
ABEM STPAUL [id number] Apr 16, 1965 54 y.o.  Subjective:   Patient ID:  Jonathon Walker is a 54 y.o. (DOB 1965-10-10) male.  Chief Complaint:  Chief Complaint  Patient presents with  . Anxiety  . Cognitive complaints  . Follow-up    Reduction in olanzapine and alcohol use    Depression        Associated symptoms include no decreased concentration and no suicidal ideas.  Past medical history includes anxiety.   Anxiety Patient reports no confusion, decreased concentration, nervous/anxious behavior or suicidal ideas.       Jonathon Walker presents to the office today for follow-up of anxiety , depression, alcohol.    seen August 7.  His anxiety was unmanaged at Paxil 40 mg and olanzapine 5 mg so olanzapine was increased to 7.5 daily.  seen December 17, 2018.  His anxiety was unmanaged.  The following change was made: Trial 1-1/2 of the 7.5 mg tablets of olanzapine for anxiety.  He is had a stay at [facility] for alcohol dependence since he was last here. Stayed 28 days and DC before Thanksgiving.  Very helpful.  Was having NM nighly before and they stopped since then.  Not drinking.  Involved in AA since then.  Last seen March 25, 2019.  The patient was doing better requested a reduction in olanzapine to 7.5 mg nightly to improve energy.  Last visit April 03, 2019.  He had remained sober and his anxiety was improved with sobriety.  He was having problems with low motivation and forgetfulness.  At the next refill it was decided olanzapine would be reduced from 7.5 to 5 mg daily. He called requesting this urgent appointment because of worsening symptoms associated with returning to work.   Still sober.  Still hard getting movitvated and low sex drive.  Didn't notice any change with reduction in olanzapine. B Complex helped focus and forgetfulness.  PE soon.  Sleep OK except more NM and not as deep as when first started olanzapine.  Less rested in AM.  No awakening.     Says he's starting a new job and fears poor job performance.   Not bathing like he should and doing dishes.  Reduced olanzapine to 7.5 mg daily and maybe that helped.  Anxiety is fine best in his life. Remained sober and going to [employer]. Normal appetite returned after having poor one for years on alcohol.  Overall feels about the same.  Added Wellbutrin for fatigue and sleepiness and it seemed to help.  Better than last month.  Less absent-minded and fatigue.  Lack of ambition.  Was irritable but resolved.  No craving alcohol.  Anxiety is a lot better than it was.  Depression is not severe but not gone.  Still some social anxiety.    Getting enough sleep but a little less rested.  Seeing [name] young for 4-5 mos in therapy.  On disability for 7-8 years.  Working PT  Past psych meds:  Paxil 60 "too much",  Buspar stopped DT little effect. Xanax, duloxetine, Wellbutrin, Zoloft, Abilify, Rexulti. Viibryd 60. Olanzapine 10   Review of Systems:  Review of Systems  Neurological: Negative for weakness.  Psychiatric/Behavioral: Positive for depression and dysphoric mood. Negative for agitation, behavioral problems, confusion, decreased concentration, hallucinations, self-injury, sleep disturbance and suicidal ideas. The patient is not nervous/anxious and is not hyperactive.     Medications: I have reviewed the patient's current medications.  Current Outpatient Medications  Medication Sig Dispense Refill  .  amLODipine (NORVASC) 2.5 MG tablet TAKE 1 TABLET(2.5 MG) BY MOUTH DAILY 90 tablet 0  . atorvastatin (LIPITOR) 40 MG tablet Take 1 tablet (40 mg total) by mouth daily. 90 tablet 0  . buPROPion (WELLBUTRIN) 75 MG tablet Take 1 tablet (75 mg total) by mouth every morning. 90 tablet 0  . lisinopril (ZESTRIL) 20 MG tablet TAKE 1 TABLET(20 MG) BY MOUTH DAILY 90 tablet 0  . OLANZapine (ZYPREXA) 5 MG tablet Take 1.5 tablets (7.5 mg total) by mouth at bedtime. (Patient taking differently: Take 5  mg by mouth at bedtime. ) 30 tablet 0  . PARoxetine (PAXIL) 40 MG tablet Take 1 tablet (40 mg total) by mouth daily. 90 tablet 0   No current facility-administered medications for this visit.    Medication Side Effects: sexual,  Allergies: No Known Allergies  Past Medical History:  Diagnosis Date  . Anxiety   . Bimalleolar fracture of left ankle   . Depression   . Sleep apnea    uses CPAP nightly    Family History  Problem Relation Age of Onset  . Cancer Maternal Grandfather     Social History   Socioeconomic History  . Marital status: Single    Spouse name: Not on file  . Number of children: Not on file  . Years of education: Not on file  . Highest education level: Not on file  Occupational History  . Not on file  Tobacco Use  . Smoking status: Current Every Day Smoker    Packs/day: 1.50    Years: 3.00    Pack years: 4.50    Types: Cigarettes  . Smokeless tobacco: Never Used  Substance and Sexual Activity  . Alcohol use: Yes    Comment: drinks 12 beers daily  . Drug use: No  . Sexual activity: Not on file  Other Topics Concern  . Not on file  Social History Narrative  . Not on file   Social Determinants of Health   Financial Resource Strain:   . Difficulty of Paying Living Expenses: Not on file  Food Insecurity:   . Worried About Charity fundraiser in the Last Year: Not on file  . Ran Out of Food in the Last Year: Not on file  Transportation Needs:   . Lack of Transportation (Medical): Not on file  . Lack of Transportation (Non-Medical): Not on file  Physical Activity:   . Days of Exercise per Week: Not on file  . Minutes of Exercise per Session: Not on file  Stress:   . Feeling of Stress : Not on file  Social Connections:   . Frequency of Communication with Friends and Family: Not on file  . Frequency of Social Gatherings with Friends and Family: Not on file  . Attends Religious Services: Not on file  . Active Member of Clubs or Organizations:  Not on file  . Attends Archivist Meetings: Not on file  . Marital Status: Not on file  Intimate Partner Violence:   . Fear of Current or Ex-Partner: Not on file  . Emotionally Abused: Not on file  . Physically Abused: Not on file  . Sexually Abused: Not on file    Past Medical History, Surgical history, Social history, and Family history were reviewed and updated as appropriate.   Please see review of systems for further details on the patient's review from today.   Objective:   Physical Exam:  There were no vitals taken for this visit.  Physical  Exam Constitutional:      General: He is not in acute distress.    Appearance: Normal appearance. He is well-developed.     Comments: Red face  Musculoskeletal:        General: No deformity.  Neurological:     Mental Status: He is alert and oriented to person, place, and time.     Motor: No tremor.     Coordination: Coordination normal.     Gait: Gait normal.  Psychiatric:        Attention and Perception: Attention normal. He is attentive.        Mood and Affect: Mood is depressed. Mood is not anxious. Affect is not labile, blunt, angry or inappropriate.        Speech: Speech normal.        Behavior: Behavior normal.        Thought Content: Thought content normal. Thought content does not include homicidal or suicidal ideation. Thought content does not include homicidal or suicidal plan.        Cognition and Memory: Cognition normal.        Judgment: Judgment normal.     Comments: Insight  fair.  Depression is getting better but is flat     Lab Review:     Component Value Date/Time   NA 139 05/31/2018 1100   K 4.4 05/31/2018 1100   CL 102 05/31/2018 1100   CO2 27 05/31/2018 1100   GLUCOSE 103 (H) 05/31/2018 1100   BUN 12 05/31/2018 1100   CREATININE 1.14 05/31/2018 1100   CALCIUM 9.8 05/31/2018 1100   PROT 7.6 05/31/2018 1100   ALBUMIN 4.6 05/31/2018 1100   AST 23 05/31/2018 1100   ALT 19 05/31/2018 1100    ALKPHOS 79 05/31/2018 1100   BILITOT 0.6 05/31/2018 1100       Component Value Date/Time   WBC 5.2 05/31/2018 1100   RBC 5.08 05/31/2018 1100   HGB 17.0 05/31/2018 1100   HGB 15.2 05/05/2010 1049   HCT 48.4 05/31/2018 1100   HCT 44.8 05/05/2010 1049   PLT 236.0 05/31/2018 1100   PLT 193 05/05/2010 1049   MCV 95.3 05/31/2018 1100   MCV 89.6 05/05/2010 1049   MCH 30.4 05/05/2010 1049   MCHC 35.1 05/31/2018 1100   RDW 12.7 05/31/2018 1100   RDW 13.1 05/05/2010 1049   LYMPHSABS 1.4 05/31/2018 1100   LYMPHSABS 1.8 05/05/2010 1049   MONOABS 0.4 05/31/2018 1100   MONOABS 0.4 05/05/2010 1049   EOSABS 0.2 05/31/2018 1100   EOSABS 0.2 05/05/2010 1049   BASOSABS 0.1 05/31/2018 1100   BASOSABS 0.0 05/05/2010 1049    No results found for: POCLITH, LITHIUM   No results found for: PHENYTOIN, PHENOBARB, VALPROATE, CBMZ   .res Assessment: Plan:     Jonathon Walker was seen today for anxiety, cognitive complaints and follow-up.  Diagnoses and all orders for this visit:  Major depressive disorder with single episode, in partial remission ([place])  GAD (generalized anxiety disorder)  Alcohol dependence with alcohol-induced mood disorder ([facility])  OSA (obstructive sleep apnea)   Anxiety is largely gone.  Worries about job performance with the new job.  He gets easily overwhelmed still.  Complains that motivation is not as good as it should be.  We discussed that his cognitive problems could be related to chronic alcohol use and alcohols interference with B vitamin absorption. Depression is getting better.  Tolerating Wellbutrin and it seems to help. He didn't tolerate the increase.  Anxiety is  improved off alcohol.  Ok to reduce olanzapine to 2.5 mg daily for a month and stop it.  Hopefully low motivation will improve.  Discussed potential metabolic side effects associated with atypical antipsychotics, as well as potential risk for movement side effects. Advised pt to contact office if  movement side effects occur.  He agrees.  Call if you have any problems with this.  Continue CPAP use.  Continue B Complex vitamin Consider checking vitamin levels for cognitive problems if they persist.  Disc how this can relate to alcohol dependence.  Continue AA and counseling for alcohol and anxiety problems.  Continue Paxil.  Did not feel well at 60 mg so we will continue 40 mg.  FU 2 mos  Lynder Parents, MD, DFAPA   Please see After Visit Summary for patient specific instructions.  Future Appointments  Date Time Provider [place]  06/07/2019  1:00 PM Nafziger, Tommi Rumps, NP [facility]    No orders of the defined types were placed in this encounter.     -------------------------------

## 2019-05-25 ENCOUNTER — Other Ambulatory Visit: Payer: Self-pay | Admitting: Psychiatry

## 2019-05-25 DIAGNOSIS — F411 Generalized anxiety disorder: Secondary | ICD-10-CM

## 2019-05-25 DIAGNOSIS — F324 Major depressive disorder, single episode, in partial remission: Secondary | ICD-10-CM

## 2019-05-27 NOTE — Telephone Encounter (Signed)
Apt 03/02, decrease to 2.5 mg then discontinue

## 2019-06-06 ENCOUNTER — Other Ambulatory Visit: Payer: Self-pay

## 2019-06-07 ENCOUNTER — Ambulatory Visit (INDEPENDENT_AMBULATORY_CARE_PROVIDER_SITE_OTHER): Payer: Medicare Other | Admitting: Adult Health

## 2019-06-07 ENCOUNTER — Encounter: Payer: Self-pay | Admitting: Adult Health

## 2019-06-07 VITALS — BP 128/66 | Temp 98.0°F | Ht 75.5 in | Wt 268.0 lb

## 2019-06-07 DIAGNOSIS — R011 Cardiac murmur, unspecified: Secondary | ICD-10-CM | POA: Diagnosis not present

## 2019-06-07 DIAGNOSIS — Z0000 Encounter for general adult medical examination without abnormal findings: Secondary | ICD-10-CM

## 2019-06-07 DIAGNOSIS — Z23 Encounter for immunization: Secondary | ICD-10-CM

## 2019-06-07 DIAGNOSIS — M79672 Pain in left foot: Secondary | ICD-10-CM

## 2019-06-07 DIAGNOSIS — G4733 Obstructive sleep apnea (adult) (pediatric): Secondary | ICD-10-CM

## 2019-06-07 DIAGNOSIS — F1010 Alcohol abuse, uncomplicated: Secondary | ICD-10-CM

## 2019-06-07 DIAGNOSIS — I10 Essential (primary) hypertension: Secondary | ICD-10-CM

## 2019-06-07 DIAGNOSIS — Z1211 Encounter for screening for malignant neoplasm of colon: Secondary | ICD-10-CM | POA: Diagnosis not present

## 2019-06-07 DIAGNOSIS — E782 Mixed hyperlipidemia: Secondary | ICD-10-CM

## 2019-06-07 DIAGNOSIS — F411 Generalized anxiety disorder: Secondary | ICD-10-CM

## 2019-06-07 DIAGNOSIS — Z720 Tobacco use: Secondary | ICD-10-CM

## 2019-06-07 DIAGNOSIS — F324 Major depressive disorder, single episode, in partial remission: Secondary | ICD-10-CM

## 2019-06-07 DIAGNOSIS — Z125 Encounter for screening for malignant neoplasm of prostate: Secondary | ICD-10-CM

## 2019-06-07 NOTE — Progress Notes (Signed)
Subjective:    Patient ID: Jonathon Walker, male    DOB: [birth year], 54 y.o.   MRN: [id number]  HPI Patient presents for yearly preventative medicine examination. He is a pleasant 54 year old male who  has a past medical history of Anxiety, Bimalleolar fracture of left ankle, Depression, and Sleep apnea.  Anxiety/Depression -he continues to be seen by psychiatry.  Is currently prescribed Paxil 40 mg and had Wellbutrin 75 mg added for fatigue and sleepiness.  He reports that this seemed to help the symptoms.  He is also prescribed Zyprexa for insomnia but psychiatry has him weaning off this medication.  Hypertension - currently prescribed lisinopril 20 mg and Norvasc 2.5 mg daily.  He denies issues with dizziness, lightheadedness, chest pain or shortness of breath. BP Readings from Last 3 Encounters:  06/07/19 128/66  08/14/18 (!) 144/88  05/31/18 (!) 150/92    Hyperlipidemia - takes lipitor 40 mg daily.  Lab Results  Component Value Date   CHOL 290 (H) 05/31/2018   HDL 51.50 05/31/2018   LDLDIRECT 204.0 05/31/2018   TRIG 202.0 (H) 05/31/2018   CHOLHDL 6 05/31/2018   OSA-he has a history of sleep apnea but has not had his machine calibrated in greater than 10 years.  He reports that times he will still wake up gasping for breath despite using his CPAP.  He would like to be referred to pulmonary to see if his settings need to be changed  Tobacco Use - continues to smoke 1.5 packs a day. He knows that he needs to quit   Alcohol Abuse -the end of October 2020 he went to [facility] for rehab.  He stayed for 28 days and was discharged right before Thanksgiving.  He found this rehab very helpful and has been sober since.  He is going to [facility] on a routine basis.  Left Foot Pain -has been present for some time.  Foot pain is located on the plantar surface below the great toe as well as little toe.  He reports, it feels like I have glass in my foot".  He denies trauma or  aggravating injury.  Is more painful at times when he ambulatory.  Pain is intermittent and he reports "I might go a couple months without pain".  All immunizations and health maintenance protocols were reviewed with the patient and needed orders were placed. Due for Tdap   Appropriate screening laboratory values were ordered for the patient including screening of hyperlipidemia, renal function and hepatic function.  Medication reconciliation,  past medical history, social history, problem list and allergies were reviewed in detail with the patient  Goals were established with regard to weight loss, exercise, and  diet in compliance with medications Wt Readings from Last 3 Encounters:  06/07/19 268 lb (121.6 kg)  08/14/18 252 lb (114.3 kg)  05/31/18 249 lb (112.9 kg)   He is due for routine screening colonoscopy.  Review of Systems  Constitutional: Positive for appetite change and fatigue.  HENT: Negative.   Eyes: Negative.   Respiratory: Negative.   Cardiovascular: Negative.   Gastrointestinal: Negative.   Endocrine: Negative.   Genitourinary: Negative.   Musculoskeletal: Negative.   Skin: Negative.   Allergic/Immunologic: Negative.   Neurological: Negative.   Hematological: Negative.   Psychiatric/Behavioral: Positive for sleep disturbance.  All other systems reviewed and are negative.  Past Medical History:  Diagnosis Date  . Anxiety   . Bimalleolar fracture of left ankle   . Depression   .  Sleep apnea    uses CPAP nightly    Social History   Socioeconomic History  . Marital status: Single    Spouse name: Not on file  . Number of children: Not on file  . Years of education: Not on file  . Highest education level: Not on file  Occupational History  . Not on file  Tobacco Use  . Smoking status: Current Every Day Smoker    Packs/day: 1.50    Years: 3.00    Pack years: 4.50    Types: Cigarettes  . Smokeless tobacco: Never Used  Substance and Sexual Activity   . Alcohol use: Yes    Comment: drinks 12 beers daily  . Drug use: No  . Sexual activity: Not on file  Other Topics Concern  . Not on file  Social History Narrative  . Not on file   Social Determinants of Health   Financial Resource Strain:   . Difficulty of Paying Living Expenses:   Food Insecurity:   . Worried About Charity fundraiser in the Last Year:   . Arboriculturist in the Last Year:   Transportation Needs:   . Film/video editor (Medical):   [name] Lack of Transportation (Non-Medical):   Physical Activity:   . Days of Exercise per Week:   . Minutes of Exercise per Session:   Stress:   . Feeling of Stress :   Social Connections:   . Frequency of Communication with Friends and Family:   . Frequency of Social Gatherings with Friends and Family:   . Attends Religious Services:   . Active Member of Clubs or Organizations:   . Attends Archivist Meetings:   [name] Marital Status:   Intimate Partner Violence:   . Fear of Current or Ex-Partner:   . Emotionally Abused:   [name] Physically Abused:   . Sexually Abused:     Past Surgical History:  Procedure Laterality Date  . ORIF ANKLE FRACTURE Left 09/19/2017   Procedure: OPEN REDUCTION INTERNAL FIXATION (ORIF) ANKLE FRACTURE;  Surgeon: Renette Butters, MD;  Location: [facility];  Service: Orthopedics;  Laterality: Left;  . TOOTH EXTRACTION      Family History  Problem Relation Age of Onset  . Cancer Maternal Grandfather     No Known Allergies  Current Outpatient Medications on File Prior to Visit  Medication Sig Dispense Refill  . amLODipine (NORVASC) 2.5 MG tablet TAKE 1 TABLET(2.5 MG) BY MOUTH DAILY 90 tablet 0  . atorvastatin (LIPITOR) 40 MG tablet Take 1 tablet (40 mg total) by mouth daily. 90 tablet 0  . buPROPion (WELLBUTRIN) 75 MG tablet Take 1 tablet (75 mg total) by mouth every morning. 90 tablet 0  . lisinopril (ZESTRIL) 20 MG tablet TAKE 1 TABLET(20 MG) BY MOUTH DAILY 90 tablet 0  .  OLANZapine (ZYPREXA) 5 MG tablet Take 0.5 tablets (2.5 mg total) by mouth at bedtime. 15 tablet 0  . PARoxetine (PAXIL) 40 MG tablet Take 1 tablet (40 mg total) by mouth daily. 90 tablet 0   No current facility-administered medications on file prior to visit.    BP 128/66   Temp 98 F (36.7 C)   Ht 6' 3.5" (1.918 m)   Wt 268 lb (121.6 kg)   BMI 33.06 kg/m       Objective:   Physical Exam Vitals and nursing note reviewed.  Constitutional:      General: He is not in acute distress.  Appearance: Normal appearance. He is well-developed. He is obese.  HENT:     Head: Normocephalic and atraumatic.     Right Ear: Tympanic membrane, ear canal and external ear normal. There is no impacted cerumen.     Left Ear: Tympanic membrane, ear canal and external ear normal. There is no impacted cerumen.     Nose: Nose normal. No congestion or rhinorrhea.     Mouth/Throat:     Mouth: Mucous membranes are moist.     Pharynx: Oropharynx is clear. No oropharyngeal exudate or posterior oropharyngeal erythema.  Eyes:     General:        Right eye: No discharge.        Left eye: No discharge.     Extraocular Movements: Extraocular movements intact.     Conjunctiva/sclera: Conjunctivae normal.     Pupils: Pupils are equal, round, and reactive to light.  Neck:     Vascular: No carotid bruit.     Trachea: No tracheal deviation.  Cardiovascular:     Rate and Rhythm: Normal rate and regular rhythm.     Pulses: Normal pulses.     Heart sounds: Murmur (Heard at left sternal border, left apex,, and at Erb's point.) present. No friction rub. No gallop.   Pulmonary:     Effort: Pulmonary effort is normal. No respiratory distress.     Breath sounds: Normal breath sounds. No stridor. No wheezing, rhonchi or rales.  Chest:     Chest wall: No tenderness.  Abdominal:     General: Bowel sounds are normal. There is no distension.     Palpations: Abdomen is soft. There is no mass.     Tenderness: There is  no abdominal tenderness. There is no right CVA tenderness, left CVA tenderness, guarding or rebound.     Hernia: No hernia is present.  Musculoskeletal:        General: Tenderness present. No swelling, deformity or signs of injury. Normal range of motion.     Right lower leg: No edema.     Left lower leg: No edema.     Comments: Foreign body felt on the anterior surface of left foot.  He did have pain with palpation below the right great toe and left little toe.  Lymphadenopathy:     Cervical: No cervical adenopathy.  Skin:    General: Skin is warm and dry.     Capillary Refill: Capillary refill takes less than 2 seconds.     Coloration: Skin is not jaundiced or pale.     Findings: No bruising, erythema, lesion or rash.  Neurological:     General: No focal deficit present.     Mental Status: He is alert and oriented to person, place, and time.     Cranial Nerves: No cranial nerve deficit.     Sensory: No sensory deficit.     Motor: No weakness.     Coordination: Coordination normal.     Gait: Gait normal.     Deep Tendon Reflexes: Reflexes normal.  Psychiatric:        Mood and Affect: Mood normal.        Behavior: Behavior normal.        Thought Content: Thought content normal.        Judgment: Judgment normal.       Assessment & Plan:  1. Routine general medical examination at a health care facility -Needs to quit smoking and needs to lose weight.  Encouraged heart healthy  diet and frequent exercise.  Follow-up in 1 year or sooner if needed - CBC with Differential/Platelet; Future - Comprehensive metabolic panel; Future - Hemoglobin A1c; Future - Lipid panel; Future - TSH; Future  2. Colon cancer screening  - Ambulatory referral to Gastroenterology  3. OSA (obstructive sleep apnea)  - Ambulatory referral to Pulmonology  4. Heart murmur -He has never been told that he has a heart murmur before.  Due to new murmur will send to cardiology for further evaluation and  likely echocardiogram - Ambulatory referral to Cardiology - CBC with Differential/Platelet; Future - Comprehensive metabolic panel; Future - Hemoglobin A1c; Future - Lipid panel; Future - TSH; Future - IBC + Ferritin; Future  5. Essential hypertension - Blood Pressure well controlled.  No changes in medication - CBC with Differential/Platelet; Future - Comprehensive metabolic panel; Future - Hemoglobin A1c; Future - Lipid panel; Future - TSH; Future  6. GAD (generalized anxiety disorder) - continue follow up with psychiatry   7. Major depressive disorder with single episode, in partial remission ([place]) - continue follow up with psychiatry   8. Prostate cancer screening  - PSA; Future  9. Tobacco use -Courage to cut back on smoking.  10. Alcohol abuse -Congratulated on quitting drinking.  Continue with AA  11. Mixed hyperlipidemia -Consider increase in statin - CBC with Differential/Platelet; Future - Comprehensive metabolic panel; Future - Hemoglobin A1c; Future - Lipid panel; Future - TSH; Future  12. Left foot pain  - DG Foot Complete Left; Future  13. Need for tetanus booster  - Tdap vaccine greater than or equal to 7yo IM  [name], NP   This visit occurred during the SARS-CoV-2 public health emergency.  Safety protocols were in place, including screening questions prior to the visit, additional usage of staff PPE, and extensive cleaning of exam room while observing appropriate contact time as indicated for disinfecting solutions.

## 2019-06-07 NOTE — Patient Instructions (Signed)
It was great seeing you today   Someone will going to call you from pulmonary, cardiology, and gastroenterology   Please schedule your lab work   I will follow up with you regarding your blood work

## 2019-06-09 ENCOUNTER — Other Ambulatory Visit: Payer: Self-pay | Admitting: Psychiatry

## 2019-06-09 DIAGNOSIS — F411 Generalized anxiety disorder: Secondary | ICD-10-CM

## 2019-06-09 DIAGNOSIS — F324 Major depressive disorder, single episode, in partial remission: Secondary | ICD-10-CM

## 2019-06-11 ENCOUNTER — Telehealth: Payer: Self-pay | Admitting: Psychiatry

## 2019-06-11 NOTE — Telephone Encounter (Signed)
He has been taking olanzapine for some time and we just reduced the dosage to 2.5 mg daily.  So it does not make sense to me that that is causing depression.  Clarify with him.  Does he mean that he is gotten more depressed since reducing the olanzapine?  If that is the case we need to go up in the dose until he sees me at his next appointment.  If that is not at the case then he can increase the Wellbutrin to 1 tablet of the 75 mg size twice daily.    Because he has been having suicidal thoughts he needs to move up his appointment with me.  Call if those get worse.

## 2019-06-11 NOTE — Telephone Encounter (Signed)
Put him on 30-minute cancellation list for me.  Schedule him as soon as possible.

## 2019-06-11 NOTE — Telephone Encounter (Signed)
Pt. States that he thinks it is from reducing the Olanzapine. He will go back up to the dose he was taking. If he feels that this does not help he will give [place] a call back.

## 2019-06-11 NOTE — Telephone Encounter (Signed)
Jonathon Walker called and says since taking the OLANZAPINE he has felt more depressed and finds it harder to do daily tasks, he also mentioned he has been having some suicidal thoughts. He is asking if there is any way Dr. Clovis Pu can increase his dosage on the [facility] if it will help. Pt is requesting a call back [phone].

## 2019-06-12 ENCOUNTER — Other Ambulatory Visit: Payer: Self-pay

## 2019-06-12 ENCOUNTER — Telehealth: Payer: Self-pay | Admitting: Psychiatry

## 2019-06-12 NOTE — Telephone Encounter (Signed)
Done.  Appt scheduled

## 2019-06-12 NOTE — Telephone Encounter (Signed)
Jonathon Walker called back.  He did get scheduled for an appt this Friday 3/26.  The up and down on the olanzapine is not working.  He is really struggling.  He can only manage to work 4 hrs.  He needs to try something else.  Is there anything else he can do until appt Friday? Please call

## 2019-06-13 ENCOUNTER — Ambulatory Visit (INDEPENDENT_AMBULATORY_CARE_PROVIDER_SITE_OTHER): Payer: Medicare Other

## 2019-06-13 ENCOUNTER — Other Ambulatory Visit (INDEPENDENT_AMBULATORY_CARE_PROVIDER_SITE_OTHER): Payer: Medicare Other

## 2019-06-13 ENCOUNTER — Other Ambulatory Visit: Payer: Medicare Other

## 2019-06-13 ENCOUNTER — Other Ambulatory Visit: Payer: Self-pay | Admitting: Family Medicine

## 2019-06-13 DIAGNOSIS — Z125 Encounter for screening for malignant neoplasm of prostate: Secondary | ICD-10-CM

## 2019-06-13 DIAGNOSIS — M19071 Primary osteoarthritis, right ankle and foot: Secondary | ICD-10-CM

## 2019-06-13 DIAGNOSIS — M21619 Bunion of unspecified foot: Secondary | ICD-10-CM

## 2019-06-13 DIAGNOSIS — I10 Essential (primary) hypertension: Secondary | ICD-10-CM | POA: Diagnosis not present

## 2019-06-13 DIAGNOSIS — R011 Cardiac murmur, unspecified: Secondary | ICD-10-CM | POA: Diagnosis not present

## 2019-06-13 DIAGNOSIS — M7750 Other enthesopathy of unspecified foot: Secondary | ICD-10-CM

## 2019-06-13 DIAGNOSIS — M19072 Primary osteoarthritis, left ankle and foot: Secondary | ICD-10-CM | POA: Diagnosis not present

## 2019-06-13 DIAGNOSIS — Z0000 Encounter for general adult medical examination without abnormal findings: Secondary | ICD-10-CM

## 2019-06-13 DIAGNOSIS — E782 Mixed hyperlipidemia: Secondary | ICD-10-CM | POA: Diagnosis not present

## 2019-06-13 DIAGNOSIS — M79672 Pain in left foot: Secondary | ICD-10-CM

## 2019-06-13 LAB — LIPID PANEL
Cholesterol: 127 mg/dL (ref 0–200)
HDL: 34.7 mg/dL — ABNORMAL LOW (ref >39.00)
LDL Cholesterol: 73 mg/dL (ref 0–99)
NonHDL: 91.99
Total CHOL/HDL Ratio: 4
Triglycerides: 94 mg/dL (ref 0.0–149.0)
VLDL: 18.8 mg/dL (ref 0.0–40.0)

## 2019-06-13 LAB — COMPREHENSIVE METABOLIC PANEL
ALT: 42 U/L (ref 0–53)
AST: 26 U/L (ref 0–37)
Albumin: 4.4 g/dL (ref 3.5–5.2)
Alkaline Phosphatase: 107 U/L (ref 39–117)
BUN: 19 mg/dL (ref 6–23)
CO2: 28 mEq/L (ref 19–32)
Calcium: 9.6 mg/dL (ref 8.4–10.5)
Chloride: 104 mEq/L (ref 96–112)
Creatinine, Ser: 1.14 mg/dL (ref 0.40–1.50)
GFR: 66.87 mL/min (ref >60.00)
Glucose, Bld: 109 mg/dL — ABNORMAL HIGH (ref 70–99)
Potassium: 4.2 mEq/L (ref 3.5–5.1)
Sodium: 139 mEq/L (ref 135–145)
Total Bilirubin: 0.4 mg/dL (ref 0.2–1.2)
Total Protein: 7 g/dL (ref 6.0–8.3)

## 2019-06-13 LAB — HEMOGLOBIN A1C: Hgb A1c MFr Bld: 5.7 % (ref 4.6–6.5)

## 2019-06-13 LAB — CBC WITH DIFFERENTIAL/PLATELET
Basophils Absolute: 0 10*3/uL (ref 0.0–0.1)
Basophils Relative: 0.8 % (ref 0.0–3.0)
Eosinophils Absolute: 0.3 10*3/uL (ref 0.0–0.7)
Eosinophils Relative: 5 % (ref 0.0–5.0)
HCT: 42.7 % (ref 39.0–52.0)
Hemoglobin: 14.8 g/dL (ref 13.0–17.0)
Lymphocytes Relative: 38.7 % (ref 12.0–46.0)
Lymphs Abs: 2.1 10*3/uL (ref 0.7–4.0)
MCHC: 34.6 g/dL (ref 30.0–36.0)
MCV: 91.6 fl (ref 78.0–100.0)
Monocytes Absolute: 0.5 10*3/uL (ref 0.1–1.0)
Monocytes Relative: 9.6 % (ref 3.0–12.0)
Neutro Abs: 2.5 10*3/uL (ref 1.4–7.7)
Neutrophils Relative %: 45.9 % (ref 43.0–77.0)
Platelets: 230 10*3/uL (ref 150.0–400.0)
RBC: 4.66 Mil/uL (ref 4.22–5.81)
RDW: 12.7 % (ref 11.5–15.5)
WBC: 5.5 10*3/uL (ref 4.0–10.5)

## 2019-06-13 LAB — IBC + FERRITIN
Ferritin: 182.9 ng/mL (ref 22.0–322.0)
Iron: 85 ug/dL (ref 42–165)
Saturation Ratios: 25 % (ref 20.0–50.0)
Transferrin: 243 mg/dL (ref 212.0–360.0)

## 2019-06-13 LAB — PSA: PSA: 0.32 ng/mL (ref 0.10–4.00)

## 2019-06-13 LAB — TSH: TSH: 2.06 u[IU]/mL (ref 0.35–4.50)

## 2019-06-13 NOTE — Telephone Encounter (Signed)
Pt. Made aware.

## 2019-06-13 NOTE — Telephone Encounter (Signed)
He just increased the olanzapine 2 days ago back to 5 mg daily.  The previous reduction in olanzapine is the only logical cause for his symptoms getting worse.  He needs to give that increase in olanzapine some time.  I will see him in the office tomorrow.

## 2019-06-14 ENCOUNTER — Encounter: Payer: Self-pay | Admitting: Psychiatry

## 2019-06-14 ENCOUNTER — Other Ambulatory Visit: Payer: Self-pay

## 2019-06-14 ENCOUNTER — Ambulatory Visit (INDEPENDENT_AMBULATORY_CARE_PROVIDER_SITE_OTHER): Payer: Medicare Other | Admitting: Psychiatry

## 2019-06-14 DIAGNOSIS — F411 Generalized anxiety disorder: Secondary | ICD-10-CM | POA: Diagnosis not present

## 2019-06-14 DIAGNOSIS — F1024 Alcohol dependence with alcohol-induced mood disorder: Secondary | ICD-10-CM | POA: Diagnosis not present

## 2019-06-14 DIAGNOSIS — G4733 Obstructive sleep apnea (adult) (pediatric): Secondary | ICD-10-CM

## 2019-06-14 DIAGNOSIS — F331 Major depressive disorder, recurrent, moderate: Secondary | ICD-10-CM | POA: Diagnosis not present

## 2019-06-14 NOTE — Progress Notes (Signed)
Jonathon Walker [id number] Oct 28, 1965 54 y.o.  Subjective:   Patient ID:  Jonathon Walker is a 54 y.o. (DOB 1965-12-03) male.  Chief Complaint:  Chief Complaint  Patient presents with  . Depression  . Anxiety  . Stress    Depression        Associated symptoms include no decreased concentration and no suicidal ideas.  Past medical history includes anxiety.   Anxiety Symptoms include nervous/anxious behavior. Patient reports no confusion, decreased concentration or suicidal ideas.       Jonathon Walker presents to the office today for follow-up of anxiety , depression, alcohol.    seen August 7.  His anxiety was unmanaged at Paxil 40 mg and olanzapine 5 mg so olanzapine was increased to 7.5 daily.  seen December 17, 2018.  His anxiety was unmanaged.  The following change was made: Trial 1-1/2 of the 7.5 mg tablets of olanzapine for anxiety.  He is had a stay at [facility] for alcohol dependence since he was last here. Stayed 28 days and DC before Thanksgiving.  Very helpful.  Was having NM nighly before and they stopped since then.  Not drinking.  Involved in AA since then.  seen March 25, 2019.  The patient was doing better requested a reduction in olanzapine to 7.5 mg nightly to improve energy.  visit April 03, 2019.  He had remained sober and his anxiety was improved with sobriety.  He was having problems with low motivation and forgetfulness.  At the next refill it was decided olanzapine would be reduced from 7.5 to 5 mg daily. He called requesting this urgent appointment because of worsening symptoms associated with returning to work.  Last seen May 21, 2019.  He was doing relatively well.  The following was noted: Tolerating Wellbutrin and it seems to help. He didn't tolerate the increase. Anxiety is improved off alcohol.  Ok to reduce olanzapine to 2.5 mg daily for a month and stop it.  Hopefully low motivation will improve.   He called back March 23 stating he  was more depressed and having a harder time doing routine tasks and having suicidal thoughts.  Because of worsening depression after reducing the olanzapine he was encouraged to increase olanzapine back to 5 mg nightly. He called again March 24 stating he was really struggling but was scheduled for an appointment on March 26.  As of June 14, 2019 he reports the following: Every day a real hard struggle to get through the days.  More anxious and depressed equally. Worse 2 weeks.  Last Saturday so overwhelmed by how he feels he had SI.  Everything is hard. Initial benefit paroxetine has been lost.  Awakens stressed and tired. Adequate hours of sleep.  Will be major chore just to mow grass.  Still sober. SE sexual.  Getting appt with CPAP doc.  Seeing Jonathon Walker for 4-5 mos in therapy.  On disability for 7-8 years.  Working PT  Past psych meds:  Paxil 60 "too much",  Buspar stopped DT little effect. Xanax, duloxetine, Wellbutrin, Zoloft, Viibryd 60 Abilify, Rexulti. .  Olanzapine 10   Review of Systems:  Review of Systems  Neurological: Negative for weakness.  Psychiatric/Behavioral: Positive for depression and dysphoric mood. Negative for agitation, behavioral problems, confusion, decreased concentration, hallucinations, self-injury, sleep disturbance and suicidal ideas. The patient is nervous/anxious. The patient is not hyperactive.     Medications: I have reviewed the patient's current medications.  Current Outpatient Medications  Medication Sig  Dispense Refill  . amLODipine (NORVASC) 2.5 MG tablet TAKE 1 TABLET(2.5 MG) BY MOUTH DAILY 90 tablet 0  . atorvastatin (LIPITOR) 40 MG tablet Take 1 tablet (40 mg total) by mouth daily. 90 tablet 0  . buPROPion (WELLBUTRIN) 75 MG tablet Take 1 tablet (75 mg total) by mouth every morning. 90 tablet 0  . lisinopril (ZESTRIL) 20 MG tablet TAKE 1 TABLET(20 MG) BY MOUTH DAILY 90 tablet 0  . OLANZapine (ZYPREXA) 5 MG tablet Take 0.5 tablets (2.5 mg  total) by mouth at bedtime. (Patient taking differently: Take 5 mg by mouth at bedtime. ) 15 tablet 0  . PARoxetine (PAXIL) 40 MG tablet TAKE 1 TABLET(40 MG) BY MOUTH DAILY 90 tablet 0   No current facility-administered medications for this visit.    Medication Side Effects: sexual,  Allergies: No Known Allergies  Past Medical History:  Diagnosis Date  . Anxiety   . Bimalleolar fracture of left ankle   . Depression   . Sleep apnea    uses CPAP nightly    Family History  Problem Relation Age of Onset  . Cancer Maternal Grandfather     Social History   Socioeconomic History  . Marital status: Single    Spouse name: Not on file  . Number of children: Not on file  . Years of education: Not on file  . Highest education level: Not on file  Occupational History  . Not on file  Tobacco Use  . Smoking status: Current Every Day Smoker    Packs/day: 1.50    Years: 3.00    Pack years: 4.50    Types: Cigarettes  . Smokeless tobacco: Never Used  Substance and Sexual Activity  . Alcohol use: Yes    Comment: drinks 12 beers daily  . Drug use: No  . Sexual activity: Not on file  Other Topics Concern  . Not on file  Social History Narrative  . Not on file   Social Determinants of Health   Financial Resource Strain:   . Difficulty of Paying Living Expenses:   Food Insecurity:   . Worried About Charity fundraiser in the Last Year:   . Arboriculturist in the Last Year:   Transportation Needs:   . Film/video editor (Medical):   [name] Lack of Transportation (Non-Medical):   Physical Activity:   . Days of Exercise per Week:   . Minutes of Exercise per Session:   Stress:   . Feeling of Stress :   Social Connections:   . Frequency of Communication with Friends and Family:   . Frequency of Social Gatherings with Friends and Family:   . Attends Religious Services:   . Active Member of Clubs or Organizations:   . Attends Archivist Meetings:   [name] Marital Status:    Intimate Partner Violence:   . Fear of Current or Ex-Partner:   . Emotionally Abused:   [name] Physically Abused:   . Sexually Abused:     Past Medical History, Surgical history, Social history, and Family history were reviewed and updated as appropriate.   Please see review of systems for further details on the patient's review from today.   Objective:   Physical Exam:  There were no vitals taken for this visit.  Physical Exam Constitutional:      General: He is not in acute distress.    Appearance: Normal appearance. He is well-developed.     Comments: Red face  Musculoskeletal:  General: No deformity.  Neurological:     Mental Status: He is alert and oriented to person, place, and time.     Motor: No tremor.     Coordination: Coordination normal.     Gait: Gait normal.  Psychiatric:        Attention and Perception: Attention normal. He is attentive.        Mood and Affect: Mood is anxious and depressed. Affect is not labile, blunt, angry or inappropriate.        Speech: Speech normal.        Behavior: Behavior normal.        Thought Content: Thought content normal. Thought content does not include homicidal or suicidal ideation. Thought content does not include homicidal or suicidal plan.        Cognition and Memory: Cognition normal.        Judgment: Judgment normal.     Comments: Insight  fair.  He is markedly worse.     Lab Review:     Component Value Date/Time   NA 139 06/13/2019 0820   K 4.2 06/13/2019 0820   CL 104 06/13/2019 0820   CO2 28 06/13/2019 0820   GLUCOSE 109 (H) 06/13/2019 0820   BUN 19 06/13/2019 0820   CREATININE 1.14 06/13/2019 0820   CALCIUM 9.6 06/13/2019 0820   PROT 7.0 06/13/2019 0820   ALBUMIN 4.4 06/13/2019 0820   AST 26 06/13/2019 0820   ALT 42 06/13/2019 0820   ALKPHOS 107 06/13/2019 0820   BILITOT 0.4 06/13/2019 0820       Component Value Date/Time   WBC 5.5 06/13/2019 0820   RBC 4.66 06/13/2019 0820   HGB 14.8  06/13/2019 0820   HGB 15.2 05/05/2010 1049   HCT 42.7 06/13/2019 0820   HCT 44.8 05/05/2010 1049   PLT 230.0 06/13/2019 0820   PLT 193 05/05/2010 1049   MCV 91.6 06/13/2019 0820   MCV 89.6 05/05/2010 1049   MCH 30.4 05/05/2010 1049   MCHC 34.6 06/13/2019 0820   RDW 12.7 06/13/2019 0820   RDW 13.1 05/05/2010 1049   LYMPHSABS 2.1 06/13/2019 0820   LYMPHSABS 1.8 05/05/2010 1049   MONOABS 0.5 06/13/2019 0820   MONOABS 0.4 05/05/2010 1049   EOSABS 0.3 06/13/2019 0820   EOSABS 0.2 05/05/2010 1049   BASOSABS 0.0 06/13/2019 0820   BASOSABS 0.0 05/05/2010 1049    No results found for: POCLITH, LITHIUM   No results found for: PHENYTOIN, PHENOBARB, VALPROATE, CBMZ   .res Assessment: Plan:     Pavan was seen today for depression, anxiety and stress.  Diagnoses and all orders for this visit:  Major depressive disorder, recurrent episode, moderate (HCC)  GAD (generalized anxiety disorder)  Alcohol dependence with alcohol-induced mood disorder (HCC)  OSA (obstructive sleep apnea)    Patient seen for urgent appointment today.  Patient is markedly more depressed and anxious somewhat unexpectedly since his last visit.  He is remained sober.  It was felt that his alcohol was a major driving force for his depression and anxiety but he has remained sober and has had a worsening depression and anxiety.  He now states he probably had more symptoms at his last visit than the note indicated but he thought it would resolve.  Instead it got worse.  We had reduced the olanzapine at the last visit and have increased it but only for a couple of days back to 5 mg daily.  That could be a significant attribute  to his  recent worsening.  Examined prior med history which have included SSRIs, SNRIs, partial dopamine agonists, atypical antidepressant Viibryd, olanzapine, buspirone.  We do not want to repeat any of those types.  He has developed treatment resistant depression and anxiety.  Tolerating  Wellbutrin and it seems to help. He didn't tolerate the increase.  Option potentiate lithium, switch TCA, Vraylar, Latuda, switch Seroquel.  Disc SE each. Lithium 300 daily for 2 days, then 600 mg daily. Counseled patient regarding potential benefits, risks, and side effects of lithium to include potential risk of lithium affecting thyroid and renal function.  Discussed need for periodic lab monitoring to determine drug level and to assess for potential adverse effects.  Counseled patient regarding signs and symptoms of lithium toxicity and advised that they notify office immediately or seek urgent medical attention if experiencing these signs and symptoms.  Patient advised to contact office with any questions or concerns. He agrees to the low-dose lithium trial.  Discussed potential metabolic side effects associated with atypical antipsychotics, as well as potential risk for movement side effects. Advised pt to contact office if movement side effects occur.  He agrees.  Call if you have any problems with this.  Continue CPAP use. Agree with getting it evaluated.    Continue B Complex vitamin Consider checking vitamin levels for cognitive problems if they persist.  Disc how this can relate to alcohol dependence.  Continue AA and counseling for alcohol and anxiety problems.  Continue Paxil.  Did not feel well at 60 mg so we will continue 40 mg.  FU 3-4 weeks  Lynder Parents, MD, DFAPA   Please see After Visit Summary for patient specific instructions.  Future Appointments  Date Time Provider Department Center  07/08/2019  2:20 PM Jettie Booze, MD [facility]  07/15/2019  9:00 AM Cottle, Billey Co., MD [facility] None  07/24/2019  1:30 PM Cottle, Billey Co., MD [facility] None    No orders of the defined types were placed in this encounter.     -------------------------------

## 2019-06-25 ENCOUNTER — Other Ambulatory Visit: Payer: Self-pay | Admitting: Psychiatry

## 2019-06-25 DIAGNOSIS — F324 Major depressive disorder, single episode, in partial remission: Secondary | ICD-10-CM

## 2019-06-25 DIAGNOSIS — F411 Generalized anxiety disorder: Secondary | ICD-10-CM

## 2019-07-04 ENCOUNTER — Encounter: Payer: Self-pay | Admitting: Podiatry

## 2019-07-04 ENCOUNTER — Ambulatory Visit (INDEPENDENT_AMBULATORY_CARE_PROVIDER_SITE_OTHER): Payer: Medicare Other

## 2019-07-04 ENCOUNTER — Other Ambulatory Visit: Payer: Self-pay

## 2019-07-04 ENCOUNTER — Other Ambulatory Visit: Payer: Self-pay | Admitting: Podiatry

## 2019-07-04 ENCOUNTER — Ambulatory Visit: Payer: Medicare Other

## 2019-07-04 ENCOUNTER — Ambulatory Visit: Payer: Medicare Other | Admitting: Podiatry

## 2019-07-04 VITALS — Temp 98.2°F

## 2019-07-04 DIAGNOSIS — M79672 Pain in left foot: Secondary | ICD-10-CM

## 2019-07-04 DIAGNOSIS — M779 Enthesopathy, unspecified: Secondary | ICD-10-CM

## 2019-07-04 DIAGNOSIS — M216X9 Other acquired deformities of unspecified foot: Secondary | ICD-10-CM

## 2019-07-04 DIAGNOSIS — L84 Corns and callosities: Secondary | ICD-10-CM

## 2019-07-04 DIAGNOSIS — M2011 Hallux valgus (acquired), right foot: Secondary | ICD-10-CM

## 2019-07-04 NOTE — Progress Notes (Signed)
Subjective:   Patient ID: Jonathon Walker, male   DOB: 54 y.o.   MRN: [id number]   HPI Patient presents stating she has a lot of fluid underneath the left outside the foot that is been very tender and has lesions on both feet that are bothersome with the left being worse than the right.  Knows he has bad foot structure and patient does smoke a pack and a half of cigarettes per day and does drink but does like to be active   Review of Systems  All other systems reviewed and are negative.       Objective:  Physical Exam Vitals and nursing note reviewed.  Constitutional:      Appearance: He is well-developed.  Pulmonary:     Effort: Pulmonary effort is normal.  Musculoskeletal:        General: Normal range of motion.  Skin:    General: Skin is warm.  Neurological:     Mental Status: He is alert.     Neurovascular status intact muscle strength found to be adequate range of motion within normal limits.  Patient is found to have keratotic lesion to underneath the left first and fifth metatarsal with inflammation fluid of the fifth MPJ with pain when present and inability to wear shoe gear with severe cavus foot structure and reactive changes noted F2     Assessment:  Inflammatory capsulitis fifth MPJ left with lesion formation x2 with severe foot structural issues left over right     Plan:  H NP condition reviewed and I went ahead did sterile prep and injected the fifth MPJ 3 mg Dexasone Kenalog 5 mg Xylocaine I did sharp sterile debridement of lesions no growth.  The patient will be seen back to recheck  X-rays indicate that there is significant foot structural malalignment with pressure against the outside of the left foot

## 2019-07-04 NOTE — Progress Notes (Signed)
   Subjective:    Patient ID: Jonathon Walker, male    DOB: November 03, 1965, 54 y.o.   MRN: [id number]  HPI    Review of Systems  All other systems reviewed and are negative.      Objective:   Physical Exam        Assessment & Plan:

## 2019-07-08 ENCOUNTER — Encounter: Payer: Self-pay | Admitting: Interventional Cardiology

## 2019-07-08 ENCOUNTER — Other Ambulatory Visit: Payer: Self-pay

## 2019-07-08 ENCOUNTER — Ambulatory Visit: Payer: Medicare Other | Admitting: Interventional Cardiology

## 2019-07-08 VITALS — BP 130/72 | HR 69 | Ht 75.5 in | Wt 262.0 lb

## 2019-07-08 DIAGNOSIS — I10 Essential (primary) hypertension: Secondary | ICD-10-CM

## 2019-07-08 DIAGNOSIS — Z720 Tobacco use: Secondary | ICD-10-CM

## 2019-07-08 DIAGNOSIS — R011 Cardiac murmur, unspecified: Secondary | ICD-10-CM

## 2019-07-08 DIAGNOSIS — E782 Mixed hyperlipidemia: Secondary | ICD-10-CM | POA: Diagnosis not present

## 2019-07-08 NOTE — Progress Notes (Signed)
Cardiology Office Note   Date:  07/08/2019   ID:  Jonathon Walker, DOB Aug 02, 1965, MRN [id number]  PCP:  [name], NP    No chief complaint on file.  murmur  Wt Readings from Last 3 Encounters:  07/08/19 262 lb (118.8 kg)  06/07/19 268 lb (121.6 kg)  08/14/18 252 lb (114.3 kg)       History of Present Illness: Jonathon Walker is a 54 y.o. male who is being seen today for the evaluation of heart murmur at the request of Nafziger, Tommi Rumps, NP.  "-He has never been told that he has a heart murmur before.  Due to new murmur will send to cardiology for further evaluation and likely echocardiogram - Ambulatory referral to Cardiology"  Murmur noted at physical.  No sx.  Treated for HTN, hyperlipidemia.  Denies : Chest pain. Dizziness. Leg edema. Nitroglycerin use. Orthopnea. Palpitations. Paroxysmal nocturnal dyspnea. Shortness of breath. Syncope.   Family h/o AAA.  Patient smokes for many years.  H/o EtOH abuse but has quit.   Past Medical History:  Diagnosis Date  . Anxiety   . Bimalleolar fracture of left ankle   . Depression   . Sleep apnea    uses CPAP nightly    Past Surgical History:  Procedure Laterality Date  . ORIF ANKLE FRACTURE Left 09/19/2017   Procedure: OPEN REDUCTION INTERNAL FIXATION (ORIF) ANKLE FRACTURE;  Surgeon: Renette Butters, MD;  Location: [facility];  Service: Orthopedics;  Laterality: Left;  . TOOTH EXTRACTION       Current Outpatient Medications  Medication Sig Dispense Refill  . amLODipine (NORVASC) 2.5 MG tablet TAKE 1 TABLET(2.5 MG) BY MOUTH DAILY 90 tablet 0  . atorvastatin (LIPITOR) 40 MG tablet Take 1 tablet (40 mg total) by mouth daily. 90 tablet 0  . buPROPion (WELLBUTRIN) 75 MG tablet Take 1 tablet (75 mg total) by mouth every morning. 90 tablet 0  . lisinopril (ZESTRIL) 20 MG tablet TAKE 1 TABLET(20 MG) BY MOUTH DAILY 90 tablet 0  . LITHIUM PO Take 1 tablet by mouth daily.    [name] OLANZapine (ZYPREXA) 5 MG  tablet Take 1 tablet (5 mg total) by mouth at bedtime. 30 tablet 1  . PARoxetine (PAXIL) 40 MG tablet TAKE 1 TABLET(40 MG) BY MOUTH DAILY 90 tablet 0   No current facility-administered medications for this visit.    Allergies:   Patient has no known allergies.    Social History:  The patient  reports that he has been smoking cigarettes. He has a 4.50 pack-year smoking history. He has never used smokeless tobacco. He reports current alcohol use. He reports that he does not use drugs.   Family History:  The patient's family history includes Cancer in his maternal grandfather. no early CAD- parents are healthy   ROS:  Please see the history of present illness.   Otherwise, review of systems are positive for difficulty stopping smoking.   All other systems are reviewed and negative.    PHYSICAL EXAM: VS:  BP 130/72   Pulse 69   Ht 6' 3.5" (1.918 m)   Wt 262 lb (118.8 kg)   SpO2 98%   BMI 32.32 kg/m  , BMI Body mass index is 32.32 kg/m. GEN: Well nourished, well developed, in no acute distress  HEENT: normal  Neck: no JVD, carotid bruits, or masses Cardiac: RRR; no murmurs, rubs, or gallops,no edema  Respiratory:  clear to auscultation bilaterally, normal work of breathing  GI: soft, nontender, nondistended, + BS MS: no deformity or atrophy  Skin: warm and dry, no rash Neuro:  Strength and sensation are intact Psych: euthymic mood, full affect   EKG:   The ekg ordered today demonstrates normal ECG   Recent Labs: 06/13/2019: ALT 42; BUN 19; Creatinine, Ser 1.14; Hemoglobin 14.8; Platelets 230.0; Potassium 4.2; Sodium 139; TSH 2.06   Lipid Panel    Component Value Date/Time   CHOL 127 06/13/2019 0820   TRIG 94.0 06/13/2019 0820   HDL 34.70 (L) 06/13/2019 0820   CHOLHDL 4 06/13/2019 0820   VLDL 18.8 06/13/2019 0820   LDLCALC 73 06/13/2019 0820   LDLDIRECT 204.0 05/31/2018 1100     Other studies Reviewed: Additional studies/ records that were reviewed today with results  demonstrating: PMD records reviewed, labs reviewed.     ASSESSMENT AND PLAN:  1. Heart murmur: Systolic murmur.  Will eval with echo.  No CHF on exam.  I suspect he has mitral regurgitation.  2. Tobacco abuse: We spoke about stopping.  He will try patches when he is ready to quit.  3. HTN: The current medical regimen is effective;  continue present plan and medications. 4. Hyperlipidemia: The current medical regimen is effective;  continue present plan and medications.  Continue statin.    Current medicines are reviewed at length with the patient today.  The patient concerns regarding his medicines were addressed.  The following changes have been made:  No change  Labs/ tests ordered today include: echo No orders of the defined types were placed in this encounter.   Recommend 150 minutes/week of aerobic exercise Low fat, low carb, high fiber diet recommended  Disposition:   FU based on echo result   Signed, Larae Grooms, MD  07/08/2019 2:41 PM    [facility] [address], [place], [place]  [address] Phone: [phone]; Fax: [phone]

## 2019-07-08 NOTE — Patient Instructions (Signed)
Medication Instructions:  Your physician recommends that you continue on your current medications as directed. Please refer to the Current Medication list given to you today.  *If you need a refill on your cardiac medications before your next appointment, please call your pharmacy*   Lab Work: None ordered  If you have labs (blood work) drawn today and your tests are completely normal, you will receive your results only by: [name] MyChart Message (if you have MyChart) OR . A paper copy in the mail If you have any lab test that is abnormal or we need to change your treatment, we will call you to review the results.   Testing/Procedures: Your physician has requested that you have an echocardiogram. Echocardiography is a painless test that uses sound waves to create images of your heart. It provides your doctor with information about the size and shape of your heart and how well your heart's chambers and valves are working. This procedure takes approximately one hour. There are no restrictions for this procedure.  Follow-Up: Based on test results

## 2019-07-11 ENCOUNTER — Encounter: Payer: Self-pay | Admitting: Internal Medicine

## 2019-07-15 ENCOUNTER — Encounter: Payer: Self-pay | Admitting: Psychiatry

## 2019-07-15 ENCOUNTER — Ambulatory Visit (INDEPENDENT_AMBULATORY_CARE_PROVIDER_SITE_OTHER): Payer: Medicare Other | Admitting: Psychiatry

## 2019-07-15 ENCOUNTER — Other Ambulatory Visit: Payer: Self-pay

## 2019-07-15 DIAGNOSIS — G4733 Obstructive sleep apnea (adult) (pediatric): Secondary | ICD-10-CM

## 2019-07-15 DIAGNOSIS — F1024 Alcohol dependence with alcohol-induced mood disorder: Secondary | ICD-10-CM | POA: Diagnosis not present

## 2019-07-15 DIAGNOSIS — F331 Major depressive disorder, recurrent, moderate: Secondary | ICD-10-CM | POA: Diagnosis not present

## 2019-07-15 DIAGNOSIS — F411 Generalized anxiety disorder: Secondary | ICD-10-CM

## 2019-07-15 MED ORDER — BUPROPION HCL 75 MG PO TABS: 75.0000 mg | ORAL_TABLET | ORAL | 0 refills | Status: DC

## 2019-07-15 MED ORDER — LITHIUM CARBONATE 300 MG PO CAPS: 600.0000 mg | ORAL_CAPSULE | Freq: Every evening | ORAL | 0 refills | Status: DC

## 2019-07-15 NOTE — Progress Notes (Signed)
LARS HARTKOPF [id number] 1966/02/06 54 y.o.  Subjective:   Patient ID:  Jonathon Walker is a 54 y.o. (DOB 1966/03/03) male.  Chief Complaint:  Chief Complaint  Patient presents with  . Follow-up    Depression, anxiety, mood changes and sobriety    Depression        Associated symptoms include no decreased concentration and no suicidal ideas.  Past medical history includes anxiety.   Anxiety Symptoms include nervous/anxious behavior. Patient reports no confusion, decreased concentration or suicidal ideas.       Jonathon Walker presents to the office today for follow-up of anxiety , depression, alcohol.    seen August 7.  His anxiety was unmanaged at Paxil 40 mg and olanzapine 5 mg so olanzapine was increased to 7.5 daily.  seen December 17, 2018.  His anxiety was unmanaged.  The following change was made: Trial 1-1/2 of the 7.5 mg tablets of olanzapine for anxiety.  He is had a stay at [facility] for alcohol dependence since he was last here. Stayed 28 days and DC before Thanksgiving.  Very helpful.  Was having NM nighly before and they stopped since then.  Not drinking.  Involved in AA since then.  seen March 25, 2019.  The patient was doing better requested a reduction in olanzapine to 7.5 mg nightly to improve energy.  visit April 03, 2019.  He had remained sober and his anxiety was improved with sobriety.  He was having problems with low motivation and forgetfulness.  At the next refill it was decided olanzapine would be reduced from 7.5 to 5 mg daily. He called requesting this urgent appointment because of worsening symptoms associated with returning to work.  Last seen May 21, 2019.  He was doing relatively well.  The following was noted: Tolerating Wellbutrin and it seems to help. He didn't tolerate the increase. Anxiety is improved off alcohol.  Ok to reduce olanzapine to 2.5 mg daily for a month and stop it.  Hopefully low motivation will improve.   He  called back March 23 stating he was more depressed and having a harder time doing routine tasks and having suicidal thoughts.  Because of worsening depression after reducing the olanzapine he was encouraged to increase olanzapine back to 5 mg nightly. He called again March 24 stating he was really struggling but was scheduled for an appointment on March 26.  As of June 14, 2019 he reports the following: Every day a real hard struggle to get through the days.  More anxious and depressed equally. Worse 2 weeks.  Last Saturday so overwhelmed by how he feels he had SI.  Everything is hard. Initial benefit paroxetine has been lost.  Awakens stressed and tired. Adequate hours of sleep.  Will be major chore just to mow grass.  Still sober. SE sexual. Getting appt with CPAP doc. Changes made include: Increasing olanzapine back to 5 mg daily a couple of days prior to the appointment due to phone call and the addition of lithium 300 mg for a few days then increase to 600 mg nightly both to augment the antidepressant and because of suicidal thoughts.  July 15, 2019 appointment, the following is noted: Doing OK with both depression and anxiety.  Kind of like a top always going in mind.  Even when I sleep, when awakens mind still going.  NM nightly for years but less than before stopped drinking.  Smaller and less severe ones that don't wake him.  Theme  can't finish things.  Both depression and anxiety 3/10.  More productive.  Paces himself.  Can live with it.  Sleep 9 hours.  Initially olanzapine stopped the ruminating but it's back some.  Overall still benefit.  No SI and can't rmember why he had them before. Residual energy not great and some ruminating.  Seeing [name] young for 4-5 mos in therapy.  On disability for 7-8 years.  Working PT  Past psych meds:  Paxil 60 "too much",  Buspar stopped DT little effect. Xanax, duloxetine, Wellbutrin, Zoloft, Viibryd 60 Abilify, Rexulti. .  Olanzapine 10   Review of  Systems:  Review of Systems  Neurological: Negative for tremors and weakness.  Psychiatric/Behavioral: Positive for depression and dysphoric mood. Negative for agitation, behavioral problems, confusion, decreased concentration, hallucinations, self-injury, sleep disturbance and suicidal ideas. The patient is nervous/anxious. The patient is not hyperactive.     Medications: I have reviewed the patient's current medications.  Current Outpatient Medications  Medication Sig Dispense Refill  . amLODipine (NORVASC) 2.5 MG tablet TAKE 1 TABLET(2.5 MG) BY MOUTH DAILY 90 tablet 0  . atorvastatin (LIPITOR) 40 MG tablet Take 1 tablet (40 mg total) by mouth daily. 90 tablet 0  . buPROPion (WELLBUTRIN) 75 MG tablet Take 1 tablet (75 mg total) by mouth every morning. 90 tablet 0  . lisinopril (ZESTRIL) 20 MG tablet TAKE 1 TABLET(20 MG) BY MOUTH DAILY 90 tablet 0  . lithium carbonate 300 MG capsule Take 600 mg by mouth at bedtime.    [name] OLANZapine (ZYPREXA) 5 MG tablet Take 1 tablet (5 mg total) by mouth at bedtime. 30 tablet 1  . PARoxetine (PAXIL) 40 MG tablet TAKE 1 TABLET(40 MG) BY MOUTH DAILY 90 tablet 0   No current facility-administered medications for this visit.    Medication Side Effects: sexual,  Allergies: No Known Allergies  Past Medical History:  Diagnosis Date  . Anxiety   . Bimalleolar fracture of left ankle   . Depression   . Sleep apnea    uses CPAP nightly    Family History  Problem Relation Age of Onset  . Cancer Maternal Grandfather     Social History   Socioeconomic History  . Marital status: Single    Spouse name: Not on file  . Number of children: Not on file  . Years of education: Not on file  . Highest education level: Not on file  Occupational History  . Not on file  Tobacco Use  . Smoking status: Current Every Day Smoker    Packs/day: 1.50    Years: 3.00    Pack years: 4.50    Types: Cigarettes  . Smokeless tobacco: Never Used  Substance and Sexual  Activity  . Alcohol use: Yes    Comment: drinks 12 beers daily  . Drug use: No  . Sexual activity: Not on file  Other Topics Concern  . Not on file  Social History Narrative  . Not on file   Social Determinants of Health   Financial Resource Strain:   . Difficulty of Paying Living Expenses:   Food Insecurity:   . Worried About Charity fundraiser in the Last Year:   . Arboriculturist in the Last Year:   Transportation Needs:   . Film/video editor (Medical):   [name] Lack of Transportation (Non-Medical):   Physical Activity:   . Days of Exercise per Week:   . Minutes of Exercise per Session:   Stress:   .  Feeling of Stress :   Social Connections:   . Frequency of Communication with Friends and Family:   . Frequency of Social Gatherings with Friends and Family:   . Attends Religious Services:   . Active Member of Clubs or Organizations:   . Attends Archivist Meetings:   [name] Marital Status:   Intimate Partner Violence:   . Fear of Current or Ex-Partner:   . Emotionally Abused:   [name] Physically Abused:   . Sexually Abused:     Past Medical History, Surgical history, Social history, and Family history were reviewed and updated as appropriate.   Please see review of systems for further details on the patient's review from today.   Objective:   Physical Exam:  There were no vitals taken for this visit.  Physical Exam Constitutional:      General: He is not in acute distress.    Appearance: Normal appearance. He is well-developed.     Comments: Red face  Musculoskeletal:        General: No deformity.  Neurological:     Mental Status: He is alert and oriented to person, place, and time.     Motor: No tremor.     Coordination: Coordination normal.     Gait: Gait normal.  Psychiatric:        Attention and Perception: Attention normal. He is attentive.        Mood and Affect: Mood is anxious and depressed. Affect is not labile, blunt, angry or inappropriate.         Speech: Speech normal.        Behavior: Behavior normal.        Thought Content: Thought content normal. Thought content does not include homicidal or suicidal ideation. Thought content does not include homicidal or suicidal plan.        Cognition and Memory: Cognition normal.        Judgment: Judgment normal.     Comments: Insight  fair.  He is markedly better with residual sx both     Lab Review:     Component Value Date/Time   NA 139 06/13/2019 0820   K 4.2 06/13/2019 0820   CL 104 06/13/2019 0820   CO2 28 06/13/2019 0820   GLUCOSE 109 (H) 06/13/2019 0820   BUN 19 06/13/2019 0820   CREATININE 1.14 06/13/2019 0820   CALCIUM 9.6 06/13/2019 0820   PROT 7.0 06/13/2019 0820   ALBUMIN 4.4 06/13/2019 0820   AST 26 06/13/2019 0820   ALT 42 06/13/2019 0820   ALKPHOS 107 06/13/2019 0820   BILITOT 0.4 06/13/2019 0820       Component Value Date/Time   WBC 5.5 06/13/2019 0820   RBC 4.66 06/13/2019 0820   HGB 14.8 06/13/2019 0820   HGB 15.2 05/05/2010 1049   HCT 42.7 06/13/2019 0820   HCT 44.8 05/05/2010 1049   PLT 230.0 06/13/2019 0820   PLT 193 05/05/2010 1049   MCV 91.6 06/13/2019 0820   MCV 89.6 05/05/2010 1049   MCH 30.4 05/05/2010 1049   MCHC 34.6 06/13/2019 0820   RDW 12.7 06/13/2019 0820   RDW 13.1 05/05/2010 1049   LYMPHSABS 2.1 06/13/2019 0820   LYMPHSABS 1.8 05/05/2010 1049   MONOABS 0.5 06/13/2019 0820   MONOABS 0.4 05/05/2010 1049   EOSABS 0.3 06/13/2019 0820   EOSABS 0.2 05/05/2010 1049   BASOSABS 0.0 06/13/2019 0820   BASOSABS 0.0 05/05/2010 1049    No results found for: POCLITH, LITHIUM  No results found for: PHENYTOIN, PHENOBARB, VALPROATE, CBMZ   .res Assessment: Plan:     Tomio was seen today for follow-up.  Diagnoses and all orders for this visit:  Major depressive disorder, recurrent episode, moderate (HCC)  GAD (generalized anxiety disorder)  Alcohol dependence with alcohol-induced mood disorder (HCC)  OSA (obstructive sleep  apnea)    Patient seen for urgent appointment today.  At appointment June 14, 2019 patient was markedly more depressed and anxious somewhat unexpectedly.  He is remained sober.  It was felt that his alcohol was a major driving force for his depression and anxiety but he has remained sober and has had a worsening depression and anxiety.  He now states he probably had more symptoms at his last visit than the note indicated but he thought it would resolve.  Instead it got worse.  We had reduced the olanzapine at the prior visit.  That could be a significant attribute  to his recent worsening. Therefore at the visit June 14, 2019 we had increase olanzapine back to 5 mg daily and also added lithium 600 mg daily both to potentiate the antidepressant and because of suicidal thoughts.  Overall he is much improved versus the visit at the end of March.  He has residual symptoms of depression and anxiety but does not wish to see any med changes today.  Asks about eventually stopping Paxil bc low sex drive is disturbing.  Later try switching to Viibryd and increasing olanzapine if necessary for rumination and anxiety.  Paxil is one of the best for anxiety.  Examined prior med history which have included SSRIs, SNRIs, partial dopamine agonists, atypical antidepressant Viibryd, olanzapine, buspirone.  We do not want to repeat any of those types.  He has developed treatment resistant depression and anxiety.  Option potentiate lithium, switch TCA, Vraylar, Latuda, switch Seroquel.  Disc SE each. Lithium continue 600 mg daily. Counseled patient regarding potential benefits, risks, and side effects of lithium to include potential risk of lithium affecting thyroid and renal function.  Discussed need for periodic lab monitoring to determine drug level and to assess for potential adverse effects.  Counseled patient regarding signs and symptoms of lithium toxicity and advised that they notify office immediately or seek  urgent medical attention if experiencing these signs and symptoms.  Patient advised to contact office with any questions or concerns. He agrees to the low-dose lithium trial.  Discussed potential metabolic side effects associated with atypical antipsychotics, as well as potential risk for movement side effects. Advised pt to contact office if movement side effects occur.  He agrees.  Call if you have any problems with this.  Continue CPAP use. Agree with getting it evaluated.    Continue B Complex vitamin Consider checking vitamin levels for cognitive problems if they persist.  Disc how this can relate to alcohol dependence.  Continue AA and counseling for alcohol and anxiety problems.  Continue Paxil.  Did not feel well at 60 mg so we will continue 40 mg. Continue low-dose Wellbutrin.  Tolerating Wellbutrin and it seems to help. He didn't tolerate the increase.  FU 3-4 weeks  Lynder Parents, MD, DFAPA   Please see After Visit Summary for patient specific instructions.  Future Appointments  Date Time Provider [place]  07/24/2019  1:30 PM Cottle, Billey Co., MD [facility] None  07/24/2019  2:45 PM Wallene Huh, DPM [facility]  07/25/2019 12:45 PM [facility] 3 [id number]  08/16/2019  2:30 PM [facility]  PREVISIT RM50 [facility]  08/30/2019  9:00 AM Irene Shipper, MD [facility]    No orders of the defined types were placed in this encounter.     -------------------------------

## 2019-07-21 ENCOUNTER — Other Ambulatory Visit: Payer: Self-pay | Admitting: Adult Health

## 2019-07-21 ENCOUNTER — Other Ambulatory Visit: Payer: Self-pay | Admitting: Psychiatry

## 2019-07-21 DIAGNOSIS — I10 Essential (primary) hypertension: Secondary | ICD-10-CM

## 2019-07-21 DIAGNOSIS — F331 Major depressive disorder, recurrent, moderate: Secondary | ICD-10-CM

## 2019-07-24 ENCOUNTER — Encounter: Payer: Self-pay | Admitting: Podiatry

## 2019-07-24 ENCOUNTER — Other Ambulatory Visit: Payer: Self-pay

## 2019-07-24 ENCOUNTER — Ambulatory Visit: Payer: Medicare Other | Admitting: Psychiatry

## 2019-07-24 ENCOUNTER — Other Ambulatory Visit: Payer: Self-pay | Admitting: Family Medicine

## 2019-07-24 ENCOUNTER — Ambulatory Visit: Payer: Medicare Other | Admitting: Podiatry

## 2019-07-24 VITALS — Temp 97.7°F

## 2019-07-24 DIAGNOSIS — M779 Enthesopathy, unspecified: Secondary | ICD-10-CM | POA: Diagnosis not present

## 2019-07-24 DIAGNOSIS — I10 Essential (primary) hypertension: Secondary | ICD-10-CM

## 2019-07-24 DIAGNOSIS — L84 Corns and callosities: Secondary | ICD-10-CM

## 2019-07-24 MED ORDER — LISINOPRIL 20 MG PO TABS: ORAL_TABLET | ORAL | 2 refills | Status: DC

## 2019-07-24 NOTE — Telephone Encounter (Signed)
Sent to the pharmacy by e-scribe. 

## 2019-07-24 NOTE — Progress Notes (Signed)
Subjective:   Patient ID: Jonathon Walker, male   DOB: 54 y.o.   MRN: [id number]   HPI Patient presents stating the outside of the left foot currently is doing very well but the inside of the left foot is painful with fluid buildup and making shoe gear difficult along with keratotic lesion   ROS      Objective:  Physical Exam  Neurovascular status intact with inflammation of the medial plantar side of the left first metatarsal with abnormal foot structure and keratotic tissue formation     Assessment:  Inflammatory capsulitis of the first MPJ left with lesion formation with poor foot structure     Plan:  H&P reviewed condition sterile prep done and did capsular injection of the plantar capsule 3 mg Dexasone Kenalog 5 mg Xylocaine debrided the lesion debrided the area and advised on supportive shoe gear.  Patient will be seen back to recheck as needed

## 2019-07-25 ENCOUNTER — Ambulatory Visit (HOSPITAL_COMMUNITY): Payer: Medicare Other | Attending: Cardiovascular Disease

## 2019-07-25 DIAGNOSIS — R011 Cardiac murmur, unspecified: Secondary | ICD-10-CM

## 2019-07-30 ENCOUNTER — Telehealth: Payer: Self-pay | Admitting: Adult Health

## 2019-07-30 ENCOUNTER — Telehealth: Payer: Self-pay | Admitting: Interventional Cardiology

## 2019-07-30 NOTE — Telephone Encounter (Signed)
Pt's mother, Helmut Muster (ok per DPR) stated that her son had a cardio procedure last week and the heart doctor informed him that he has a hole in his heart and to return in one year. Pt's mother did not want to bother the cardiologist and would his PCP to discuss further about the hole in heart. She is concerned bc his father also had a whole in his heart and fear that this might be genetic.     Apolonio Schneiders can be reached at [phone] -ok to leave a detailed message per her

## 2019-07-30 NOTE — Telephone Encounter (Signed)
[place] the patient's mother is calling requesting more information on the results of his Echo that Yerik was called in regards to yesterday. She states if she is unable to answer when calling back please leave a detailed message.

## 2019-07-30 NOTE — Telephone Encounter (Signed)
Spoke to [place] ([place] on file) and informed her that her questions would be more appropriately addressed by cardiology and that she should call Jettie Booze, MD office.  She agreed to call.  Nothing further needed.

## 2019-07-30 NOTE — Telephone Encounter (Signed)
Called and made patient's mother aware of echo results per DPR. She verbalized understanding and thanked me for the call.

## 2019-08-14 ENCOUNTER — Other Ambulatory Visit: Payer: Self-pay

## 2019-08-14 ENCOUNTER — Ambulatory Visit (INDEPENDENT_AMBULATORY_CARE_PROVIDER_SITE_OTHER): Payer: Medicare Other | Admitting: Psychiatry

## 2019-08-14 ENCOUNTER — Encounter: Payer: Self-pay | Admitting: Psychiatry

## 2019-08-14 DIAGNOSIS — F331 Major depressive disorder, recurrent, moderate: Secondary | ICD-10-CM | POA: Diagnosis not present

## 2019-08-14 DIAGNOSIS — F411 Generalized anxiety disorder: Secondary | ICD-10-CM | POA: Diagnosis not present

## 2019-08-14 DIAGNOSIS — F1024 Alcohol dependence with alcohol-induced mood disorder: Secondary | ICD-10-CM

## 2019-08-14 DIAGNOSIS — G4733 Obstructive sleep apnea (adult) (pediatric): Secondary | ICD-10-CM

## 2019-08-14 NOTE — Patient Instructions (Signed)
Counselor Lanetta Inch

## 2019-08-14 NOTE — Progress Notes (Addendum)
Jonathon Walker [id number] [birth year] 54 y.o.  Subjective:   Patient ID:  Jonathon Walker is a 54 y.o. (DOB [birth year]) male.  Chief Complaint:  Chief Complaint  Patient presents with  . Follow-up  . Depression  . Anxiety    Depression        Associated symptoms include no decreased concentration and no suicidal ideas.  Past medical history includes anxiety.   Anxiety Symptoms include nervous/anxious behavior. Patient reports no confusion, decreased concentration or suicidal ideas.       Jonathon Walker presents to the office today for follow-up of anxiety , depression, alcohol.    seen August 7.  His anxiety was unmanaged at Paxil 40 mg and olanzapine 5 mg so olanzapine was increased to 7.5 daily.  seen December 17, 2018.  His anxiety was unmanaged.  The following change was made: Trial 1-1/2 of the 7.5 mg tablets of olanzapine for anxiety.  He is had a stay at [place] for alcohol dependence since he was last here. Stayed 28 days and DC before Thanksgiving.  Very helpful.  Was having NM nighly before and they stopped since then.  Not drinking.  Involved in AA since then.  seen March 25, 2019.  The patient was doing better requested a reduction in olanzapine to 7.5 mg nightly to improve energy.  visit April 03, 2019.  He had remained sober and his anxiety was improved with sobriety.  He was having problems with low motivation and forgetfulness.  At the next refill it was decided olanzapine would be reduced from 7.5 to 5 mg daily. He called requesting this urgent appointment because of worsening symptoms associated with returning to work.  seen May 21, 2019.  He was doing relatively well.  The following was noted: Tolerating Wellbutrin and it seems to help. He didn't tolerate the increase. Anxiety is improved off alcohol.  Ok to reduce olanzapine to 2.5 mg daily for a month and stop it.  Hopefully low motivation will improve.   He called back March 23 stating he  was more depressed and having a harder time doing routine tasks and having suicidal thoughts.  Because of worsening depression after reducing the olanzapine he was encouraged to increase olanzapine back to 5 mg nightly. He called again March 24 stating he was really struggling but was scheduled for an appointment on March 26.  As of June 14, 2019 he reports the following: Every day a real hard struggle to get through the days.  More anxious and depressed equally. Worse 2 weeks.  Last Saturday so overwhelmed by how he feels he had SI.  Everything is hard. Initial benefit paroxetine has been lost.  Awakens stressed and tired. Adequate hours of sleep.  Will be major chore just to mow grass.  Still sober. SE sexual. Getting appt with CPAP doc. Changes made include: Increasing olanzapine back to 5 mg daily a couple of days prior to the appointment due to phone call and the addition of lithium 300 mg for a few days then increase to 600 mg nightly both to augment the antidepressant and because of suicidal thoughts.  July 15, 2019 appointment, the following is noted: Doing OK with both depression and anxiety.  Kind of like a top always going in mind.  Even when I sleep, when awakens mind still going.  NM nightly for years but less than before stopped drinking.  Smaller and less severe ones that don't wake him.  Theme can't finish things.  Both depression and anxiety 3/10.  More productive.  Paces himself.  Can live with it.  Sleep 9 hours.  Initially olanzapine stopped the ruminating but it's back some.  Overall still benefit.  No SI and can't rmember why he had them before. Residual energy not great and some ruminating. He had some concerns about sexual side effects from Paxil but agreed that no med changes were necessary despite residual symptoms of depression and anxiety.  08/14/2019 appointment, the following is noted:  He scheduled urgently. Vaccinated. Angry easily and exhausted and everything is a  chore.  Exhausted. Never had appt with CPAP company.  CPAP is 55 years old and on same settings as in the past.  CPAP machine is not working properly but when it did it was helpful for alertness and energy.  Disc need to call them to have it evaluated. To bed 10-7.  Don't feel rested in morning but used to feel rested when first started paroxetine.   Seeing [name] young for 4-5 mos in therapy.  On disability for 7-8 years.  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 Xanax, duloxetine, Wellbutrin75 (max tolerated), Zoloft, Viibryd 60 Abilify, Rexulti. .  Olanzapine 10  lithium  900  Review of Systems:  Review of Systems  Neurological: Negative for tremors and weakness.  Psychiatric/Behavioral: Positive for depression and dysphoric mood. Negative for agitation, behavioral problems, confusion, decreased concentration, hallucinations, self-injury, sleep disturbance and suicidal ideas. The patient is nervous/anxious. The patient is not hyperactive.     Medications: I have reviewed the patient's current medications.  Current Outpatient Medications  Medication Sig Dispense Refill  . amLODipine (NORVASC) 2.5 MG tablet TAKE 1 TABLET(2.5 MG) BY MOUTH DAILY 90 tablet 3  . atorvastatin (LIPITOR) 40 MG tablet TAKE 1 TABLET(40 MG) BY MOUTH DAILY 90 tablet 3  . buPROPion (WELLBUTRIN) 75 MG tablet Take 1 tablet (75 mg total) by mouth every morning. 90 tablet 0  . lisinopril (ZESTRIL) 20 MG tablet TAKE 1 TABLET(20 MG) BY MOUTH DAILY 90 tablet 2  . lithium carbonate 300 MG capsule Take 2 capsules (600 mg total) by mouth at bedtime. 180 capsule 0  . OLANZapine (ZYPREXA) 5 MG tablet Take 1 tablet (5 mg total) by mouth at bedtime. 30 tablet 1  . PARoxetine (PAXIL) 40 MG tablet TAKE 1 TABLET(40 MG) BY MOUTH DAILY 90 tablet 0   No current facility-administered medications for this visit.    Medication Side Effects: sexual,  Allergies:  No Known Allergies  Past Medical History:  Diagnosis Date  . Anxiety   . Bimalleolar fracture of left ankle   . Depression   . Sleep apnea    uses CPAP nightly    Family History  Problem Relation Age of Onset  . Cancer Maternal Grandfather     Social History   Socioeconomic History  . Marital status: Single    Spouse name: Not on file  . Number of children: Not on file  . Years of education: Not on file  . Highest education level: Not on file  Occupational History  . Not on file  Tobacco Use  . Smoking status: Current Every Day Smoker    Packs/day: 1.50    Years: 3.00    Pack years: 4.50    Types: Cigarettes  . Smokeless tobacco: Never Used  Substance and Sexual Activity  . Alcohol use: Yes    Comment: drinks 12  beers daily  . Drug use: No  . Sexual activity: Not on file  Other Topics Concern  . Not on file  Social History Narrative  . Not on file   Social Determinants of Health   Financial Resource Strain:   . Difficulty of Paying Living Expenses:   Food Insecurity:   . Worried About Charity fundraiser in the Last Year:   . Arboriculturist in the Last Year:   Transportation Needs:   . Film/video editor (Medical):   [name] Lack of Transportation (Non-Medical):   Physical Activity:   . Days of Exercise per Week:   . Minutes of Exercise per Session:   Stress:   . Feeling of Stress :   Social Connections:   . Frequency of Communication with Friends and Family:   . Frequency of Social Gatherings with Friends and Family:   . Attends Religious Services:   . Active Member of Clubs or Organizations:   . Attends Archivist Meetings:   [name] Marital Status:   Intimate Partner Violence:   . Fear of Current or Ex-Partner:   . Emotionally Abused:   [name] Physically Abused:   . Sexually Abused:     Past Medical History, Surgical history, Social history, and Family history were reviewed and updated as appropriate.   Please see review of systems for  further details on the patient's review from today.   Objective:   Physical Exam:  There were no vitals taken for this visit.  Physical Exam Constitutional:      General: He is not in acute distress.    Appearance: Normal appearance. He is well-developed.     Comments: Red face  Musculoskeletal:        General: No deformity.  Neurological:     Mental Status: He is alert and oriented to person, place, and time.     Motor: No tremor.     Coordination: Coordination normal.     Gait: Gait normal.  Psychiatric:        Attention and Perception: Attention normal. He is attentive.        Mood and Affect: Mood is anxious and depressed. Affect is not labile, blunt, angry or inappropriate.        Speech: Speech normal.        Behavior: Behavior normal. Behavior is not agitated.        Thought Content: Thought content normal. Thought content does not include homicidal or suicidal ideation. Thought content does not include homicidal or suicidal plan.        Cognition and Memory: Cognition normal.        Judgment: Judgment normal.     Comments: Insight  fair.       Lab Review:     Component Value Date/Time   NA 139 06/13/2019 0820   K 4.2 06/13/2019 0820   CL 104 06/13/2019 0820   CO2 28 06/13/2019 0820   GLUCOSE 109 (H) 06/13/2019 0820   BUN 19 06/13/2019 0820   CREATININE 1.14 06/13/2019 0820   CALCIUM 9.6 06/13/2019 0820   PROT 7.0 06/13/2019 0820   ALBUMIN 4.4 06/13/2019 0820   AST 26 06/13/2019 0820   ALT 42 06/13/2019 0820   ALKPHOS 107 06/13/2019 0820   BILITOT 0.4 06/13/2019 0820       Component Value Date/Time   WBC 5.5 06/13/2019 0820   RBC 4.66 06/13/2019 0820   HGB 14.8 06/13/2019 0820   HGB 15.2 05/05/2010  1049   HCT 42.7 06/13/2019 0820   HCT 44.8 05/05/2010 1049   PLT 230.0 06/13/2019 0820   PLT 193 05/05/2010 1049   MCV 91.6 06/13/2019 0820   MCV 89.6 05/05/2010 1049   MCH 30.4 05/05/2010 1049   MCHC 34.6 06/13/2019 0820   RDW 12.7 06/13/2019 0820    RDW 13.1 05/05/2010 1049   LYMPHSABS 2.1 06/13/2019 0820   LYMPHSABS 1.8 05/05/2010 1049   MONOABS 0.5 06/13/2019 0820   MONOABS 0.4 05/05/2010 1049   EOSABS 0.3 06/13/2019 0820   EOSABS 0.2 05/05/2010 1049   BASOSABS 0.0 06/13/2019 0820   BASOSABS 0.0 05/05/2010 1049    No results found for: POCLITH, LITHIUM   No results found for: PHENYTOIN, PHENOBARB, VALPROATE, CBMZ   .res Assessment: Plan:     Yeremi was seen today for follow-up, depression and anxiety.  Diagnoses and all orders for this visit:  Major depressive disorder, recurrent episode, moderate (HCC)  GAD (generalized anxiety disorder)  Alcohol dependence with alcohol-induced mood disorder (HCC)  OSA (obstructive sleep apnea)    Patient seen for urgent appointment today.  At appointment June 14, 2019 patient was markedly more depressed and anxious somewhat unexpectedly.  He is remained sober.  It was felt that his alcohol was a major driving force for his depression and anxiety but he has remained sober and has had a worsening depression and anxiety.  He now states he probably had more symptoms at his last visit than the note indicated but he thought it would resolve.  Instead it got worse.  We had reduced the olanzapine at the prior visit.  That could be a significant attribute  to his recent worsening. Therefore at the visit June 14, 2019 we had increase olanzapine back to 5 mg daily and also added lithium 600 mg daily both to potentiate the antidepressant and because of suicidal thoughts.  He does not appear overtly manic but he is complaining of irritability.  He is also very tired. He is using the same CPAP machine from 10 years ago on the same settings and has not had it checked.  Strongly suggests suggest that he get in touch with the company and have them checked the readings on the machine to see if he is getting proper benefit and perhaps consider getting a newer machine. Continue CPAP use. He gets some  benefit with it but machine is old and needs to be replaced. When he had better functioning CPAP machine it helped his alertness, mood, productivity and concentration.  He wonders if he needs an adjustment bc low energy.  Agree with getting it evaluated.  Call the company.    Examined prior med history which have included SSRIs, SNRIs, partial dopamine agonists, atypical antidepressant Viibryd, olanzapine, buspirone.  We do not want to repeat any of those types.  He has developed treatment resistant depression and anxiety.  Option potentiate lithium, switch TCA, Vraylar, Latuda, switch Seroquel.  Disc SE each. Increase Lithium continue to 900 mg daily for irritability He understands this will not help if the primary problem is poor control of sleep apnea. Counseled patient regarding potential benefits, risks, and side effects of lithium to include potential risk of lithium affecting thyroid and renal function.  Discussed need for periodic lab monitoring to determine drug level and to assess for potential adverse effects.  Counseled patient regarding signs and symptoms of lithium toxicity and advised that they notify office immediately or seek urgent medical attention if experiencing these signs and  symptoms.  Patient advised to contact office with any questions or concerns. He agrees to the low-dose lithium trial.  Discussed potential metabolic side effects associated with atypical antipsychotics, as well as potential risk for movement side effects. Advised pt to contact office if movement side effects occur.  He agrees.  Call if you have any problems with this.  Continue B Complex vitamin Consider checking vitamin levels for cognitive problems if they persist.  Disc how this can relate to alcohol dependence.  He remains sober.  He wants to consider counseling to work through some personal issues as well as address specific symptoms like his anger and irritability.  Continue Paxil.  Did not feel  well at 60 mg so we will continue 40 mg. Continue low-dose Wellbutrin 75  Tolerating Wellbutrin and it seems to help. He didn't tolerate the increase.  FU 6 weeks  Lynder Parents, MD, DFAPA   Please see After Visit Summary for patient specific instructions.  Future Appointments  Date Time Provider [place]  08/16/2019  2:30 PM [facility] PREVISIT RM[facility]  08/30/2019  9:00 AM Irene Shipper, MD [facility]    No orders of the defined types were placed in this encounter.     -------------------------------

## 2019-08-16 ENCOUNTER — Other Ambulatory Visit: Payer: Self-pay

## 2019-08-16 ENCOUNTER — Ambulatory Visit (AMBULATORY_SURGERY_CENTER): Payer: Self-pay | Admitting: *Deleted

## 2019-08-16 VITALS — Ht 75.5 in | Wt 260.0 lb

## 2019-08-16 DIAGNOSIS — Z1211 Encounter for screening for malignant neoplasm of colon: Secondary | ICD-10-CM

## 2019-08-16 MED ORDER — SUTAB 1479-225-188 MG PO TABS: 1.0000 | ORAL_TABLET | ORAL | 0 refills | Status: DC

## 2019-08-16 NOTE — Progress Notes (Signed)
Patient is here in-person for PV. Patient denies any allergies to eggs or soy. Patient denies any problems with anesthesia/sedation. Patient denies any oxygen use at home. Patient denies taking any diet/weight loss medications or blood thinners. Patient is not being treated for MRSA or C-diff. Patient is aware of our care-partner policy and 0000000 safety protocol. EMMI education assisgned to the patient for the procedure, this was explained and instructions given to patient.   Completed covid vaccines on 07/24/2019 per pt. Prep Prescription coupon given to the patient.

## 2019-08-22 ENCOUNTER — Encounter: Payer: Self-pay | Admitting: Internal Medicine

## 2019-08-26 ENCOUNTER — Other Ambulatory Visit: Payer: Self-pay | Admitting: Psychiatry

## 2019-08-26 DIAGNOSIS — F411 Generalized anxiety disorder: Secondary | ICD-10-CM

## 2019-08-26 DIAGNOSIS — F324 Major depressive disorder, single episode, in partial remission: Secondary | ICD-10-CM

## 2019-08-30 ENCOUNTER — Encounter: Payer: Self-pay | Admitting: Internal Medicine

## 2019-08-30 ENCOUNTER — Ambulatory Visit (AMBULATORY_SURGERY_CENTER): Payer: Medicare Other | Admitting: Internal Medicine

## 2019-08-30 ENCOUNTER — Other Ambulatory Visit: Payer: Self-pay

## 2019-08-30 VITALS — BP 110/62 | HR 59 | Temp 97.1°F | Resp 15 | Ht 75.5 in | Wt 260.0 lb

## 2019-08-30 DIAGNOSIS — D122 Benign neoplasm of ascending colon: Secondary | ICD-10-CM

## 2019-08-30 DIAGNOSIS — D124 Benign neoplasm of descending colon: Secondary | ICD-10-CM | POA: Diagnosis not present

## 2019-08-30 DIAGNOSIS — Z1211 Encounter for screening for malignant neoplasm of colon: Secondary | ICD-10-CM | POA: Diagnosis not present

## 2019-08-30 MED ORDER — SODIUM CHLORIDE 0.9 % IV SOLN: 500.0000 mL | INTRAVENOUS | Status: DC

## 2019-08-30 NOTE — Progress Notes (Signed)
Pt's states no medical or surgical changes since previsit or office visit. 

## 2019-08-30 NOTE — Op Note (Signed)
[facility] Patient Name: Jonathon Walker Procedure Date: 08/30/2019 9:15 AM MRN: [id number] Endoscopist: Docia Chuck. Henrene Pastor , MD Age: 54 Referring MD:  Date of Birth: Jul 13, 1965 Gender: Male Account #: [id number] Procedure:                Colonoscopy with cold snare polypectomy x 3 Indications:              Screening for colorectal malignant neoplasm Medicines:                Monitored Anesthesia Care Procedure:                Pre-Anesthesia Assessment:                           - Prior to the procedure, a History and Physical                            was performed, and patient medications and                            allergies were reviewed. The patient's tolerance of                            previous anesthesia was also reviewed. The risks                            and benefits of the procedure and the sedation                            options and risks were discussed with the patient.                            All questions were answered, and informed consent                            was obtained. Prior Anticoagulants: The patient has                            taken no previous anticoagulant or antiplatelet                            agents. ASA Grade Assessment: II - A patient with                            mild systemic disease. After reviewing the risks                            and benefits, the patient was deemed in                            satisfactory condition to undergo the procedure.                           After obtaining informed consent, the colonoscope  was passed under direct vision. Throughout the                            procedure, the patient's blood pressure, pulse, and                            oxygen saturations were monitored continuously. The                            Colonoscope was introduced through the anus and                            advanced to the the cecum, identified by                             appendiceal orifice and ileocecal valve. The                            ileocecal valve, appendiceal orifice, and rectum                            were photographed. The quality of the bowel                            preparation was good. The colonoscopy was performed                            without difficulty. The patient tolerated the                            procedure well. The bowel preparation used was                            SUPREP via split dose instruction. Scope In: 9:22:54 AM Scope Out: 9:44:17 AM Scope Withdrawal Time: 0 hours 17 minutes 18 seconds  Total Procedure Duration: 0 hours 21 minutes 23 seconds  Findings:                 Three polyps were found in the sigmoid colon,                            descending colon and ascending colon. The polyps                            were 2 to 5 mm in size. These polyps were removed                            with a cold snare. Resection and retrieval were                            complete.                           Multiple small and large-mouthed diverticula were  found in the left colon and right colon.                           Internal hemorrhoids were found during retroflexion.                           The exam was otherwise without abnormality on                            direct and retroflexion views. Complications:            No immediate complications. Estimated blood loss:                            None. Estimated Blood Loss:     Estimated blood loss: none. Impression:               - Three 2 to 5 mm polyps in the sigmoid colon, in                            the descending colon and in the ascending colon,                            removed with a cold snare. Resected and retrieved.                           - Diverticulosis in the left colon and in the right                            colon.                           - Internal hemorrhoids.                           - The  examination was otherwise normal on direct                            and retroflexion views. Recommendation:           - Repeat colonoscopy in 3 or 5 years for                            surveillance, based on final pathology.                           - Patient has a contact number available for                            emergencies. The signs and symptoms of potential                            delayed complications were discussed with the                            patient. Return to normal  activities tomorrow.                            Written discharge instructions were provided to the                            patient.                           - Resume previous diet.                           - Continue present medications.                           - Await pathology results. Docia Chuck. Henrene Pastor, MD 08/30/2019 9:50:17 AM This report has been signed electronically.

## 2019-08-30 NOTE — Progress Notes (Signed)
Temperature and VS taken by DT.

## 2019-08-30 NOTE — Progress Notes (Signed)
Called to room to assist during endoscopic procedure.  Patient ID and intended procedure confirmed with present staff. Received instructions for my participation in the procedure from the performing physician.  

## 2019-08-30 NOTE — Patient Instructions (Signed)
YOU HAD AN ENDOSCOPIC PROCEDURE TODAY AT THE  ENDOSCOPY CENTER:   Refer to the procedure report that was given to you for any specific questions about what was found during the examination.  If the procedure report does not answer your questions, please call your gastroenterologist to clarify.  If you requested that your care partner not be given the details of your procedure findings, then the procedure report has been included in a sealed envelope for you to review at your convenience later.  YOU SHOULD EXPECT: Some feelings of bloating in the abdomen. Passage of more gas than usual.  Walking can help get rid of the air that was put into your GI tract during the procedure and reduce the bloating. If you had a lower endoscopy (such as a colonoscopy or flexible sigmoidoscopy) you may notice spotting of blood in your stool or on the toilet paper. If you underwent a bowel prep for your procedure, you may not have a normal bowel movement for a few days.  Please Note:  You might notice some irritation and congestion in your nose or some drainage.  This is from the oxygen used during your procedure.  There is no need for concern and it should clear up in a day or so.  SYMPTOMS TO REPORT IMMEDIATELY:   Following lower endoscopy (colonoscopy or flexible sigmoidoscopy):  Excessive amounts of blood in the stool  Significant tenderness or worsening of abdominal pains  Swelling of the abdomen that is new, acute  Fever of 100F or higher  For urgent or emergent issues, a gastroenterologist can be reached at any hour by calling [phone]. Do not use MyChart messaging for urgent concerns.    DIET:  We do recommend a small meal at first, but then you may proceed to your regular diet.  Drink plenty of fluids but you should avoid alcoholic beverages for 24 hours.  ACTIVITY:  You should plan to take it easy for the rest of today and you should NOT DRIVE or use heavy machinery until tomorrow (because  of the sedation medicines used during the test).    FOLLOW UP: Our staff will call the number listed on your records 48-72 hours following your procedure to check on you and address any questions or concerns that you may have regarding the information given to you following your procedure. If we do not reach you, we will leave a message.  We will attempt to reach you two times.  During this call, we will ask if you have developed any symptoms of COVID 19. If you develop any symptoms (ie: fever, flu-like symptoms, shortness of breath, cough etc.) before then, please call [phone].  If you test positive for Covid 19 in the 2 weeks post procedure, please call and report this information to us.    If any biopsies were taken you will be contacted by phone or by letter within the next 1-3 weeks.  Please call us at [phone] if you have not heard about the biopsies in 3 weeks.    SIGNATURES/CONFIDENTIALITY: You and/or your care partner have signed paperwork which will be entered into your electronic medical record.  These signatures attest to the fact that that the information above on your After Visit Summary has been reviewed and is understood.  Full responsibility of the confidentiality of this discharge information lies with you and/or your care-partner. 

## 2019-08-30 NOTE — Progress Notes (Signed)
A/ox3, pleased with MAC, report to RN 

## 2019-09-03 ENCOUNTER — Encounter: Payer: Self-pay | Admitting: Internal Medicine

## 2019-09-03 ENCOUNTER — Telehealth: Payer: Self-pay

## 2019-09-03 NOTE — Telephone Encounter (Signed)
  Follow up Call-  Call back number 08/30/2019  Post procedure Call Back phone  # [phone]  Permission to leave phone message Yes  Some recent data might be hidden     Patient questions:  Do you have a fever, pain , or abdominal swelling? No. Pain Score  0 *  Have you tolerated food without any problems? Yes.    Have you been able to return to your normal activities? Yes.    Do you have any questions about your discharge instructions: Diet   No. Medications  No. Follow up visit  No.  Do you have questions or concerns about your Care? No.  Actions: * If pain score is 4 or above: No action needed, pain <4.  1. Have you developed a fever since your procedure? no  2.   Have you had an respiratory symptoms (SOB or cough) since your procedure? no  3.   Have you tested positive for COVID 19 since your procedure no  4.   Have you had any family members/close contacts diagnosed with the COVID 19 since your procedure?  no   If yes to any of these questions please route to Joylene John, RN and Erenest Rasher, RN

## 2019-09-05 ENCOUNTER — Telehealth: Payer: Self-pay | Admitting: Psychiatry

## 2019-09-05 NOTE — Telephone Encounter (Signed)
Patient called and stated that he needs a new order for a CPAP machine since his broke. [phone]

## 2019-09-06 NOTE — Telephone Encounter (Signed)
Prescription has been faxed to [facility]

## 2019-09-06 NOTE — Telephone Encounter (Signed)
Noted thank you

## 2019-09-06 NOTE — Telephone Encounter (Signed)
Signed.

## 2019-09-09 ENCOUNTER — Other Ambulatory Visit: Payer: Self-pay

## 2019-09-09 ENCOUNTER — Ambulatory Visit (INDEPENDENT_AMBULATORY_CARE_PROVIDER_SITE_OTHER): Payer: Medicare Other | Admitting: Mental Health

## 2019-09-09 DIAGNOSIS — F331 Major depressive disorder, recurrent, moderate: Secondary | ICD-10-CM | POA: Diagnosis not present

## 2019-09-09 NOTE — Progress Notes (Signed)
[facility] Initial Adult Exam  Name: Jonathon Walker Date: 09/09/2019 MRN: [id number] DOB: [birth year] PCP: [name], NP  Time spent: 53 minutes  Reason for Visit /Presenting Problem: He wants to work on the relationship w/ his mother who is age 55. Reports he copes w/ major depression over the years and alcohol abuse. Currently, does not use alcohol. He wants his mother to hear him, a sense of validation as he feels she does not hear him. Felt this was since childhood. Since age 39 he became aware and has tried to talk w/ his parents.  He stated they make statements as opposed to asking questions. He stated there is a family hx of how members do not communicate or have "real talk". He stated his father does not communicate examples are about money, feelings, anything personal. His father is 67 years old. They blame him for not communicating correctly.  He is often tired, works half days. Uses a C-PAP to assist w/ sleep.  He stated that his mother does not feel or process issues. His brother passed away 5 years ago, was hit by a car while riding his bicycle, eventually overdosing accidentally on painkillers. There was not a funeral b/c his parents denied that being a need.  He became aware of his depression at age 28, dx'd at age 27. 3 years ago, he had a significant bought of depression due to the end of the relationship w/ his gf.  He was in treatment at [facility] last Fall. He was using for about 2 years prior to getting treatment. Since he has stopped drinking, many feelings and thoughts are surfacing.  He wants to decrease feelings of fear which can occur at work Decrease to decrease feelings embarrassment and frustration when talking w/ his parents.   He cannot recall much prior to age 23 a lot of family tension, his 2 older brother would often fight.   Recommended he continue his medication management with Dr. Clovis Pu and to engage him in individual  psychotherapy.  Mental Status Exam:   Appearance:   Casual     Behavior:  Appropriate  Motor:  Normal  Speech/Language:   Clear and Coherent  Affect:  Constricted  Mood:  anxious and depressed  Thought process:  normal  Thought content:    WNL  Sensory/Perceptual disturbances:    WNL  Orientation:  x4  Attention:  Good  Concentration:  Good  Memory:  WNL  Fund of knowledge:   Good  Insight:    developing  Judgment:   Good  Impulse Control:  Good   Reported Symptoms:  Depressed mood, anxiety, rumination, negative self talk, sleep disturbance  Risk Assessment: Danger to Self:  No Self-injurious Behavior: No Danger to Others: No Duty to Warn:no Physical Aggression / Violence:No  Access to Firearms a concern: No  Gang Involvement:No  Patient / guardian was educated about steps to take if suicide or homicide risk level increases between visits: yes While future psychiatric events cannot be accurately predicted, the patient does not currently require acute inpatient psychiatric care and does not currently meet [facility] involuntary commitment criteria.  Substance Abuse History: Current substance abuse: No     Family History:  Family History  Problem Relation Age of Onset  . Cancer Maternal Grandfather   . Colon cancer Neg Hx   . Colon polyps Neg Hx   . Esophageal cancer Neg Hx   . Rectal cancer Neg Hx   . Stomach cancer Neg  Hx     Medical History/Surgical History:  Past Medical History:  Diagnosis Date  . Anxiety   . Bimalleolar fracture of left ankle   . Depression   . Heart murmur   . Sleep apnea    uses CPAP nightly    Past Surgical History:  Procedure Laterality Date  . ORIF ANKLE FRACTURE Left 09/19/2017   Procedure: OPEN REDUCTION INTERNAL FIXATION (ORIF) ANKLE FRACTURE;  Surgeon: Renette Butters, MD;  Location: [facility];  Service: Orthopedics;  Laterality: Left;  . TOOTH EXTRACTION      Medications: Current Outpatient  Medications  Medication Sig Dispense Refill  . amLODipine (NORVASC) 2.5 MG tablet TAKE 1 TABLET(2.5 MG) BY MOUTH DAILY 90 tablet 3  . atorvastatin (LIPITOR) 40 MG tablet TAKE 1 TABLET(40 MG) BY MOUTH DAILY 90 tablet 3  . buPROPion (WELLBUTRIN) 75 MG tablet Take 1 tablet (75 mg total) by mouth every morning. 90 tablet 0  . Cyanocobalamin (VITAMIN B-12 PO) Take by mouth.    [name] lisinopril (ZESTRIL) 20 MG tablet TAKE 1 TABLET(20 MG) BY MOUTH DAILY 90 tablet 2  . lithium carbonate 300 MG capsule Take 2 capsules (600 mg total) by mouth at bedtime. 180 capsule 0  . OLANZapine (ZYPREXA) 5 MG tablet TAKE 1 TABLET(5 MG) BY MOUTH AT BEDTIME 30 tablet 1  . PARoxetine (PAXIL) 40 MG tablet TAKE 1 TABLET(40 MG) BY MOUTH DAILY 90 tablet 0   No current facility-administered medications for this visit.    No Known Allergies  Diagnoses:    ICD-10-CM   1. Major depressive disorder, recurrent episode, moderate ([place])  F33.1     Plan of Care: TBD   Anson [place], [facility]

## 2019-09-12 ENCOUNTER — Ambulatory Visit: Payer: Medicare Other | Admitting: Psychiatry

## 2019-09-12 ENCOUNTER — Other Ambulatory Visit: Payer: Self-pay | Admitting: Psychiatry

## 2019-09-12 DIAGNOSIS — F411 Generalized anxiety disorder: Secondary | ICD-10-CM

## 2019-09-12 DIAGNOSIS — F324 Major depressive disorder, single episode, in partial remission: Secondary | ICD-10-CM

## 2019-09-13 ENCOUNTER — Ambulatory Visit: Payer: Medicare Other | Admitting: Psychiatry

## 2019-09-13 NOTE — Telephone Encounter (Signed)
Jonathon Walker called because he missed his appt. This morning.  For some reason he thought it was for 3pm and when he rechecked the time he realized he had missed the appt.  He is sorry.  He was wanting the appt.  He rescheduled to 8/31 which is1st available.  He is also on CXL.  He asked to have the paxil refilled because he is out as of today.

## 2019-09-17 ENCOUNTER — Other Ambulatory Visit: Payer: Self-pay

## 2019-09-17 ENCOUNTER — Ambulatory Visit (INDEPENDENT_AMBULATORY_CARE_PROVIDER_SITE_OTHER): Payer: Medicare Other | Admitting: Psychiatry

## 2019-09-17 DIAGNOSIS — F411 Generalized anxiety disorder: Secondary | ICD-10-CM

## 2019-09-17 DIAGNOSIS — F1021 Alcohol dependence, in remission: Secondary | ICD-10-CM

## 2019-09-17 DIAGNOSIS — F331 Major depressive disorder, recurrent, moderate: Secondary | ICD-10-CM | POA: Diagnosis not present

## 2019-09-17 NOTE — Progress Notes (Signed)
PROBLEM-FOCUSED INITIAL PSYCHOTHERAPY EVALUATION Jonathon Walker [facility]  Name: Jonathon Walker Date: 09/17/2019 Time spent: 55 min MRN: [id number] DOB: 1965/04/22 Guardian/Payee: self  PCP: Jonathon Walker Documentation requested on this visit: No  PROBLEM HISTORY Reason for Visit /Presenting Problem:  Chief Complaint  Patient presents with  . Establish Care  . Depression  . Family Problem   Narrative/History of Present Illness Referred by self for treatment of depression.  Initially met with a colleague not covered by his insurance, Jonathon Walker, [facility], and transferred to me as a Medicare-eligible therapist.  See prior evaluation of Jonathon Walker.  Main complaint concerns his mother, with whom he is chronically frustrated.  Says she is and means to be caring but cannot ask how he is or how he feels, that she seems to think it is weakness to talk about feelings.  [place] and what he considers an emotionally chilly upbringing.  At 54 years old his, his parents split, and he cannot recall his father leaving, but remembers he moved to 20 miles away.  Had his first major depression at age 59, after finishing high school, but was not treated until age 80.  He felt disconnected, dropped out of school, felt odd, and left out.  Thinking back to his family of origin, he remembers bickering between his brothers and his parents.  The youngest of his 3 brothers died 5 years ago of opioid overdose, having gotten on pain medicine following a bicycle accident.  Middle brother is on disability for OCD and has been disowned by their father.  Grandparents are said to be stoic, and uncomfortable.  No funerals were held for his brother, his maternal aunt, or his stepfather.  PT has worked since age 89, and did poorly in school, as school achievement and diligence were not emphasized.  Basically he has felt left to his own devices to cope emotionally with  life.  About a year ago, he started becoming more inhibited and withdrawn and was experiencing breakout anxiety and nightmares.  Turned toward alcohol, which had been a long-term coping strategy, and drank until he had a DWI and realized that his life had become unmanageable.  Says he benefited greatly from rehab last fall and has been gradually working out sober living.  At this point, alcohol temptations last no more than a half an hour and pass.  He is aware that stress and loneliness, as well as panic symptoms, tend to be his triggers.  Support system is lean.  Says he attends AA daily, but in the pandemic, it is by video.  He relates well to a [employer].  His ex-girlfriend Jonathon Walker is also on good terms, and amicable support.  He reports he has had intrusive fears of failing in his work..  He works in apartment maintenance and may have crises of confidence, despite no history of firing or discipline.  At times these escalate to nightmares, but less than they had been.  As treated with medication, Paxil was very helpful but complained of diminished sex drive.  Olanzapine has been helpful for obsessing.  Regards one of his primary problems a lack of ambition.  Prior Psychiatric Assessment/Treatment:   Outpatient treatment: In long-term medication management with Dr. Clovis Walker.  Went through long-term medication management and quick turnover at [facility], where he saw Jonathon Walker and Jonathon Walker for over 15 years.  He found her helpful and personable but service  with Jonathon Walker too rush-rush.  He does not recall why he changed therapists, but also saw Jonathon Walker, on referral from primary care.  Had short acquaintance with another therapist whose name he cannot remember.  Psychiatric hospitalization: 28-day rehab at [facility] last fall  Psychological assessment/testing: none stated   Abuse/neglect screening: Victim of abuse: No.   Victim of neglect: Not  assessed at this time / none suspected.   Perpetrator of abuse/neglect: Not assessed at this time / none suspected.   Witness / Exposure to Domestic Violence: Not assessed at this time / none suspected.   Witness to Community Violence:  Not assessed at this time / none suspected.   Protective Services Involvement: No.   Report needed: No.    Substance abuse screening: Current substance abuse: No.   History of impactful substance use/abuse: Yes.  Alcohol   FAMILY/SOCIAL HISTORY Family of origin -- as above Family of intention/current living situation -- alone Education -- deferred Vocation -- apartment maintenance. Finances -- employment Spiritually -- deferred Enjoyable activities -- deferred Other situational factors affecting treatment and prognosis: Stressors from the following areas: Health problems, Marital or family conflict and Legal issue Barriers to service: none stated  Notable cultural sensitivities: none stated Strengths: Self Advocate   MED/SURG HISTORY Med/surg history was partially reviewed with PT at this time.  Of note for psychotherapy at this time is CPAP treatment for OSA.[name] Past Medical History:  Diagnosis Date  . Anxiety   . Bimalleolar fracture of left ankle   . Depression   . Heart murmur   . Sleep apnea    uses CPAP nightly     Past Surgical History:  Procedure Laterality Date  . ORIF ANKLE FRACTURE Left 09/19/2017   Procedure: OPEN REDUCTION INTERNAL FIXATION (ORIF) ANKLE FRACTURE;  Surgeon: Jonathon Butters, MD;  Location: [facility];  Service: Orthopedics;  Laterality: Left;  . TOOTH EXTRACTION      No Known Allergies  Medications (as listed in Epic): Current Outpatient Medications  Medication Sig Dispense Refill  . amLODipine (NORVASC) 2.5 MG tablet TAKE 1 TABLET(2.5 MG) BY MOUTH DAILY 90 tablet 3  . atorvastatin (LIPITOR) 40 MG tablet TAKE 1 TABLET(40 MG) BY MOUTH DAILY 90 tablet 3  . buPROPion (WELLBUTRIN) 75 MG tablet  TAKE 1 TABLET(75 MG) BY MOUTH EVERY MORNING 90 tablet 0  . Cyanocobalamin (VITAMIN B-12 PO) Take by mouth.    [name] lisinopril (ZESTRIL) 20 MG tablet TAKE 1 TABLET(20 MG) BY MOUTH DAILY 90 tablet 2  . lithium carbonate 300 MG capsule TAKE 2 CAPSULES(600 MG) BY MOUTH AT BEDTIME (Patient taking differently: Take 300 mg by mouth daily in the afternoon. ) 180 capsule 0  . OLANZapine (ZYPREXA) 5 MG tablet TAKE 1 TABLET(5 MG) BY MOUTH AT BEDTIME 30 tablet 1  . PARoxetine (PAXIL) 40 MG tablet TAKE 1 TABLET(40 MG) BY MOUTH DAILY 90 tablet 0   No current facility-administered medications for this visit.    MENTAL STATUS AND OBSERVATIONS Appearance:   Casual     Behavior:  Appropriate  Motor:  Normal  Speech/Language:   Clear and Coherent  Affect:  Appropriate  Mood:  discontent  Thought process:  normal  Thought content:    WNL  Sensory/Perceptual disturbances:    WNL  Orientation:  Fully oriented  Attention:  Good  Concentration:  Fair  Memory:  WNL  Fund of knowledge:   Fair  Insight:    Fair  Judgment:  Good  Impulse Control:  Good   Initial Risk Assessment: Danger to self: No Self-injurious behavior: No Danger to others: No Physical aggression / violence: No Duty to warn: No Access to firearms a concern: No Gang involvement: No Patient / guardian was educated about steps to take if suicide or homicide risk level increases between visits: yes . While future psychiatric events cannot be accurately predicted, the patient does not currently require acute inpatient psychiatric care and does not currently meet [facility] involuntary commitment criteria.   DIAGNOSIS:    ICD-10-CM   1. Major depressive disorder, recurrent episode, moderate (H[facility])  F33.1   2. GAD (generalized anxiety disorder)  F41.1   3. Alcohol dependence in remission ([place])  F10.21     INITIAL TREATMENT: . Support/validation provided for distressing symptoms and confirmed rapport . Ethical orientation and informed  consent confirmed re: o privacy rights -- including but not limited to HIPAA, EMR and use of e-PHI o patient responsibilities -- scheduling, fair notice of changes, in-person vs. telehealth and regulatory and financial conditions affecting choice o expectations for working relationship in psychotherapy o needs and consents for working partnerships and exchange of information with other health care providers, especially any medication and other behavioral health providers . Initial orientation to cognitive-behavioral and solution-focused therapy approach . Psychoeducation and initial recommendations: o Educated on autonomic arousal, and the fight/flight/freeze response and interpreted how alcohol had been an attempt to medicate that. o Interpreted emotionally impoverished upbringing and witnessing conflict in his family as a lesson to keep feelings to himself without particular help learning how to acknowledge and deal with them.  This is fundamentally anxiety provoking, as it instills confidence problems taking on adult interactions and problems. o Reviewed coping behaviors at present, including listening to medications, attending [place] meetings.  Recommend looking into local meetings if more firsthand interaction is desired.  Also recommend soliciting a sponsor, as this may not only strengthen his sobriety but help fill in the sense of guidance and strength in the world that sounds like it is lacking in his experience of parents and family of origin. o For anxiety management, initially recommend journaling, to just report what is going on and what he thinks and feels.  Also recommend taking a sensory inventory when he feels more paralyzed by things, on the understanding that it will be doing something, being present, and focusing all at the same time and that doing the one thing of taking sensory inventory will tend to calm him as opposed to his thoughts scattering and being intimidated by multiple things at  once. . Outlook for therapy -- scheduling constraints, availability of crisis service, inclusion of family member(s) as appropriate  Plan: . Initial homework to journal, listen to meditations at interest, use sensory inventory to bring down anxiety states . Return to other goals and interests next time . Maintain medication as prescribed and work faithfully with relevant prescriber(s) if any changes are desired or seem indicated . Call the clinic on-call service, present to ER, or call 911 if any life-threatening psychiatric crisis Return in about 2 weeks (around 10/01/2019).  Blanchie Serve, Walker  Jonathon Walker Clinical Psychologist, [facility] [address], [place], [place] [address] [phone]

## 2019-10-07 ENCOUNTER — Other Ambulatory Visit: Payer: Self-pay

## 2019-10-07 ENCOUNTER — Ambulatory Visit (INDEPENDENT_AMBULATORY_CARE_PROVIDER_SITE_OTHER): Payer: Medicare Other | Admitting: Psychiatry

## 2019-10-07 ENCOUNTER — Ambulatory Visit: Payer: Medicare Other | Admitting: Mental Health

## 2019-10-07 DIAGNOSIS — F411 Generalized anxiety disorder: Secondary | ICD-10-CM

## 2019-10-07 DIAGNOSIS — F1024 Alcohol dependence with alcohol-induced mood disorder: Secondary | ICD-10-CM

## 2019-10-07 DIAGNOSIS — F331 Major depressive disorder, recurrent, moderate: Secondary | ICD-10-CM | POA: Diagnosis not present

## 2019-10-07 NOTE — Progress Notes (Signed)
Psychotherapy Progress Note [facility] Jonathon Moore, PhD LP  Patient ID: Jonathon Walker     MRN: [id number] Therapy format: Individual psychotherapy Date: 10/07/2019      Start: 1:07p     Stop: 1:55p     Time Spent: 48 min Location: In-person   Session narrative (presenting needs, interim history, self-report of stressors and symptoms, applications of prior therapy, status changes, and interventions made in session) "Same old broken record" with the family, especially mother, not able to listen to anything and PT wanting to talk about his upbringing.  Notes how mother switches subjects, misses his point emotionally, continually shows her self tone deaf to his feelings, and allegedly will not listen but keeps claiming she is trying.  It is irritating to try with her, not sure if he should try any more.  Remains abstinent with alcohol.  AA continues to be helpful, supportive.  Will be dealing with a suspended driver's license for a time based on his DWI months ago.  States he is accepting of it, and does not foresee particular hardship catching rides and using a scooter, or with the future requirement to use a blow and go device.  Offered possibility of alternate support groups to help him reckon with his disappointing family experience -- ACOA, CODA, e.g.  Discussed also the possibility of including mother in therapy at some point.  Somewhat skeptical, but says he would like Martin opinion on whether his mother is beyond help.  Encouraged to consider that most of the problem may be translation issues in that he is experiencing hers, his language and hers do not match well, but may be amenable to help with slowing down the process and practicing listening skills in a more controlled environment.  Asked what he would want mother to be available to hear out -- one thing might be his impending license suspension for a dated DWI (Wed, high possibility).  Discussed possible, unfamiliar or  counterintuitive moves he might make as experiments in relating to her.  Therapeutic modalities: Cognitive Behavioral Therapy, Solution-Oriented/Positive Psychology, Ego-Supportive, Psycho-education/Bibliotherapy and 12-Step  Mental Status/Observations:  Appearance:   Casual     Behavior:  Appropriate  Motor:  Restlestness  Speech/Language:   Clear and Coherent  Affect:  Appropriate and somewhat sour, responsive  Mood:  dysthymic  Thought process:  normal  Thought content:    WNL and preoccupations  Sensory/Perceptual disturbances:    WNL  Orientation:  Fully oriented  Attention:  Good    Concentration:  Good  Memory:  WNL  Insight:    Fair  Judgment:   Good  Impulse Control:  Fair   Risk Assessment: Danger to Self: No Self-injurious Behavior: No Danger to Others: No Physical Aggression / Violence: No Duty to Warn: No Access to Firearms a concern: No  Assessment of progress:  progressing  Diagnosis:   ICD-10-CM   1. Major depressive disorder, recurrent episode, moderate (HCC)  F33.1   2. GAD (generalized anxiety disorder)  F41.1   3. Alcohol dependence with alcohol-induced mood disorder ([place])  F10.24    Plan:  . Consider goals further, invite mother to counseling when ready. . Look into alternate groups, including ACOA, CODA, and possibly [facility] for expanded personal support and help adopting a serenity-based approach to family issues . Other recommendations/advice as may be noted above . Continue to utilize previously learned skills ad lib . Maintain medication as prescribed and work faithfully with relevant prescriber(s) if any changes are  desired or seem indicated . Call the clinic on-call service, present to ER, or call 911 if any life-threatening psychiatric crisis Return for session(s) already scheduled. . Already scheduled visit in this office 10/21/2019.  Blanchie Serve, PhD Jonathon Moore, PhD LP Clinical Psychologist, [facility] [address], [place],  [address] [phone]

## 2019-10-07 NOTE — Patient Instructions (Signed)
Ideas from today:  1. Another support group -- ACOA -- online and probably in-person meetings now.  See [email or web address] for information and meeting finder.  2. Option to arrange "empathy practice" time with mom -- agree ahead of time you'll tell abut something and she'll give her best guess how you feel about it; if she goes off script, can draw her back to feelings, which feelings you might be having.

## 2019-10-21 ENCOUNTER — Other Ambulatory Visit: Payer: Self-pay

## 2019-10-21 ENCOUNTER — Other Ambulatory Visit: Payer: Self-pay | Admitting: Psychiatry

## 2019-10-21 ENCOUNTER — Ambulatory Visit: Payer: Medicare Other | Admitting: Mental Health

## 2019-10-21 ENCOUNTER — Ambulatory Visit (INDEPENDENT_AMBULATORY_CARE_PROVIDER_SITE_OTHER): Payer: Medicare Other | Admitting: Psychiatry

## 2019-10-21 DIAGNOSIS — G4733 Obstructive sleep apnea (adult) (pediatric): Secondary | ICD-10-CM | POA: Diagnosis not present

## 2019-10-21 DIAGNOSIS — F1024 Alcohol dependence with alcohol-induced mood disorder: Secondary | ICD-10-CM

## 2019-10-21 DIAGNOSIS — F411 Generalized anxiety disorder: Secondary | ICD-10-CM

## 2019-10-21 DIAGNOSIS — F324 Major depressive disorder, single episode, in partial remission: Secondary | ICD-10-CM | POA: Diagnosis not present

## 2019-10-21 DIAGNOSIS — F331 Major depressive disorder, recurrent, moderate: Secondary | ICD-10-CM

## 2019-10-21 NOTE — Patient Instructions (Addendum)
Ideas from last 2 sessions:  1. Still recommend possibility of another support group -- ACOA or CODA, probably -- online and probably in-person meetings now -- for support about family experience.  See [email or web address] for information and meeting finder.  2. Recommend engaging a sponsor -- the 1:1 relationship will do a good bit more for understanding than the brief shares in meetings.  3. Can also call fellow AA members, as allies, if you like.  4. Worth getting back on B complex for memory and concentration.  5. Still possible to ask Mom to just tell you what she hears you feel, and if she says she doesn't know what to recommend, good!  It's not about figuring it out for you, it's just about her recognizing, in words you can tell, what you feel or what you're dealing with.  6. Optional tactic, if Mom pushes solutions, to ask her as calmly as possible why she wanted to offer that, or what made her think of the idea, or why it seemed like I might need that. ("[place]" approach)

## 2019-10-21 NOTE — Progress Notes (Signed)
Psychotherapy Progress Note [facility] Jonathon Moore, PhD LP  Patient ID: Jonathon Walker     MRN: [id number] Therapy format: Individual psychotherapy Date: 10/21/2019      Start: 3:15p     Stop: 3:59p     Time Spent: 44 min Location: In-person   Session narrative (presenting needs, interim history, self-report of stressors and symptoms, applications of prior therapy, status changes, and interventions made in session) Case adjudicated for DWI from a couple years ago, so he will be on a scooter for the next month, then he will have 1 year of a breathalyzer device in his car and reportedly forbidden to use the car for leisure.  Forgot to check into ACOA (or CODA).  Been having trouble with memory for some things.    Ran out of B complex, realizes he could get back on it.  Encouraged to do so, that resupplying thee vitamins will help his brain rebuild chemistry and connections he needs to more fully recover, both cognitively and emotionally.  Has OSA, offered guidance if needed re CPAP use.  Attends daily near-daily AA [name] but no sponsor as yet.  Encouraged to do so.    Did try out communication tips, best he could recall, with mother, but says she remains ignorant about how to listen and comfort him when he feels down.  Clarified his wishes, entertained the possibility that she just didn't develop that skill based on her own life and circumstances, and he may have to reduce his hopes, but offered possible phrasing for how to guide her toward what he wants.  Depends on his level of interest in teaching, and hers in learning.  Therapeutic modalities: Cognitive Behavioral Therapy, Solution-Oriented/Positive Psychology and 12-Step  Mental Status/Observations:  Appearance:   Casual     Behavior:  Appropriate  Motor:  Normal  Speech/Language:   Clear and Coherent  Affect:  Appropriate  Mood:  anxious  Thought process:  normal  Thought content:    WNL   Sensory/Perceptual disturbances:    WNL  Orientation:  Fully oriented  Attention:  Fair    Concentration:  Fair  Memory:  grossly intact  Insight:    Fair  Judgment:   Good  Impulse Control:  Fair   Risk Assessment: Danger to Self: No Self-injurious Behavior: No Danger to Others: No Physical Aggression / Violence: No Duty to Warn: No Access to Firearms a concern: No  Assessment of progress:  stabilized  Diagnosis:   ICD-10-CM   1. GAD (generalized anxiety disorder)  F41.1   2. Major depressive disorder with single episode, in partial remission ([place])  F32.4   3. Alcohol dependence with alcohol-induced mood disorder ([place])  F10.24   4. OSA (obstructive sleep apnea)  G47.33    Plan:  . Resupply B complex for brain health . Option to continue trying to recruit mother to learn empathic listening . Purse AA involvement . Other recommendations/advice as may be noted above . Continue to utilize previously learned skills ad lib . Maintain medication as prescribed and work faithfully with relevant prescriber(s) if any changes are desired or seem indicated . Call the clinic on-call service, present to ER, or call 911 if any life-threatening psychiatric crisis Return for session(s) already scheduled. . Already scheduled visit in this office 11/04/2019.  Jonathon Serve, PhD Jonathon Moore, PhD LP Clinical Psychologist, [facility] [address] [place] [employer], [place] [address] [phone]

## 2019-10-23 ENCOUNTER — Other Ambulatory Visit: Payer: Self-pay | Admitting: Psychiatry

## 2019-10-23 DIAGNOSIS — F331 Major depressive disorder, recurrent, moderate: Secondary | ICD-10-CM

## 2019-11-01 ENCOUNTER — Other Ambulatory Visit: Payer: Self-pay | Admitting: Psychiatry

## 2019-11-01 DIAGNOSIS — F411 Generalized anxiety disorder: Secondary | ICD-10-CM

## 2019-11-01 DIAGNOSIS — F324 Major depressive disorder, single episode, in partial remission: Secondary | ICD-10-CM

## 2019-11-04 ENCOUNTER — Ambulatory Visit (INDEPENDENT_AMBULATORY_CARE_PROVIDER_SITE_OTHER): Payer: Medicare Other | Admitting: Psychiatry

## 2019-11-04 ENCOUNTER — Ambulatory Visit: Payer: Medicare Other | Admitting: Mental Health

## 2019-11-04 ENCOUNTER — Other Ambulatory Visit: Payer: Self-pay

## 2019-11-04 DIAGNOSIS — F411 Generalized anxiety disorder: Secondary | ICD-10-CM

## 2019-11-04 DIAGNOSIS — F324 Major depressive disorder, single episode, in partial remission: Secondary | ICD-10-CM | POA: Diagnosis not present

## 2019-11-04 DIAGNOSIS — F1024 Alcohol dependence with alcohol-induced mood disorder: Secondary | ICD-10-CM | POA: Diagnosis not present

## 2019-11-04 NOTE — Progress Notes (Signed)
Psychotherapy Progress Note [facility] [name], PhD LP  Patient ID: Jonathon Walker     MRN: [id number] Therapy format: Individual psychotherapy Date: 11/04/2019      Start: 3:14p     Stop: 3:58p     Time Spent: 44 min Location: In-person   Session narrative (presenting needs, interim history, self-report of stressors and symptoms, applications of prior therapy, status changes, and interventions made in session) Frustrated -- lost his outlets with sobriety, ran out of olanzapine over the weekend and got wrung out, got back on it last night, fees better.  [place] last Monday discovering something called "cold mother syndrome" on the Internet, felt very validated for a couple days.  Mom comes over three times a week, does lame things like bring a gallon of milk "because it's hot out".  Still working on trying to get mother to learn how to do active listening, but it's frustrating.  She wants to monologue, usually.  Says when he presses her about something, she may c/o it hurting her head, and she compulsively walks off.  Offered option for mother Donia Guiles) to come with him on 8/30.  Discussed purposes, agreed that it would be for the purposes of better assessing the dynamic and, if she is willing, trying to clear up the kind of support she gives, with the caveat that it may turn out both need some direction and challenge.  Still working on getting a sponsor in [employer].  Was going to go to the local meeting but got derailed.  Went to [employer] but at a time when [employer].  Has not asked anyone in particular yet, not sure yet who to approach.  The most regular in-person meeting is mostly women and mostly retired people.  Affirmed and encouraged in involving himself further with 12-step recovery, that it is the way he can truly transcend reliance on alcohol.  Still on restricted driving privileges for a couple more weeks.  Although unwanted, seems to feel he'll  get through it without complication.  Therapeutic modalities: Cognitive Behavioral Therapy, Solution-Oriented/Positive Psychology and 12-Step  Mental Status/Observations:  Appearance:   Casual     Behavior:  Appropriate  Motor:  Normal  Speech/Language:   Clear and Coherent  Affect:  Appropriate  Mood:  dysthymic and frustrated  Thought process:  normal  Thought content:    WNL  Sensory/Perceptual disturbances:    WNL  Orientation:  Fully oriented  Attention:  Good    Concentration:  Fair  Memory:  grossly intact  Insight:    Fair  Judgment:   Good  Impulse Control:  Fair   Risk Assessment: Danger to Self: No Self-injurious Behavior: No Danger to Others: No Physical Aggression / Violence: No Duty to Warn: No Access to Firearms a concern: No  Assessment of progress:  stabilized  Diagnosis:   ICD-10-CM   1. GAD (generalized anxiety disorder)  F41.1   2. Major depressive disorder with single episode, in partial remission ([place])  F32.4   3. Alcohol dependence with alcohol-induced mood disorder ([place])  F10.24    Plan:  . Pursue AA further until finds stable group and sponsor . Mother likely invited to next session . Other recommendations/advice as may be noted above . Continue to utilize previously learned skills ad lib . Maintain medication as prescribed and work faithfully with relevant prescriber(s) if any changes are desired or seem indicated . Call the clinic on-call service, present to ER, or  call 911 if any life-threatening psychiatric crisis Return in about 2 weeks (around 11/18/2019) for may invite mother, session(s) already scheduled. . Already scheduled visit in this office 11/18/2019.  Blanchie Serve, PhD [name], PhD LP Clinical Psychologist, [facility] [address] [place], [place] [address] [phone]

## 2019-11-13 ENCOUNTER — Telehealth: Payer: Self-pay | Admitting: Psychiatry

## 2019-11-13 NOTE — Telephone Encounter (Signed)
Did we just do this on this patient was at a different patient?

## 2019-11-13 NOTE — Telephone Encounter (Signed)
[facility] called and said that they need to have the settings of the cpap machine om the script. The last setting they had was cm 17. Also the need an addenum made to the April 19 the note stating that he was benefiting from the the machine prior to to the machine malfuntioning. Then we need to fax them to them at [phone]

## 2019-11-13 NOTE — Telephone Encounter (Signed)
We had faxed an order for him to get a replacement CPAP in June, patient still not received. Now asking for settings to be written on Rx faxed along with update an office note about him using the CPAP. Please advise

## 2019-11-14 NOTE — Telephone Encounter (Signed)
Thank you :)

## 2019-11-18 ENCOUNTER — Ambulatory Visit: Payer: Medicare Other | Admitting: Mental Health

## 2019-11-18 ENCOUNTER — Ambulatory Visit (INDEPENDENT_AMBULATORY_CARE_PROVIDER_SITE_OTHER): Payer: Medicare Other | Admitting: Psychiatry

## 2019-11-18 ENCOUNTER — Other Ambulatory Visit: Payer: Self-pay

## 2019-11-18 DIAGNOSIS — F324 Major depressive disorder, single episode, in partial remission: Secondary | ICD-10-CM

## 2019-11-18 DIAGNOSIS — F411 Generalized anxiety disorder: Secondary | ICD-10-CM | POA: Diagnosis not present

## 2019-11-18 DIAGNOSIS — Z62820 Parent-biological child conflict: Secondary | ICD-10-CM | POA: Diagnosis not present

## 2019-11-18 DIAGNOSIS — F1021 Alcohol dependence, in remission: Secondary | ICD-10-CM

## 2019-11-18 NOTE — Progress Notes (Signed)
Psychotherapy Progress Note [facility] [name], PhD LP  Patient ID: JAMARKIS BRANAM     MRN: [id number] Therapy format: Family therapy w/ patient -- accompanied by Thressa Sheller' Date: 11/18/2019      Start: 3:13p     Stop: 4:02p     Time Spent: 49 min Location: In-person   Session narrative (presenting needs, interim history, self-report of stressors and symptoms, applications of prior therapy, status changes, and interventions made in session) M, 54yo, here on invitation of Pt.  After confirming she understands confidentiality rights, Pt reports abiding frustration that she doesn't get what he needs from her emotionally, and both largely confirm they have repetitive interactions where one or the other calls, he wants support, she wants to help, and it keeps devolving into broad characterization that she has never listened after many times being told what he wants from her in emotional support, he declares futility, and he feels just as alone as ever.  Probed the habits of each in interaction, highlighting his desire for empathic listening and common barriers.  Role played and coached live conversations in effort to shape on her part active listening and empathy without fixing analyzing, and other common errors in support and on his part taking some chance to express and give her something to learn from.  Actively interrupted pressuring comments and probing, and noted both fairly poor recognition by mother of the effects of her speech and losing track of her intentions to validate, but also saw some movement in doing so over the course of coached conversation.  Able to get partial recognition from Ginny that she interjects other subjects defensively, subtly counts on Pt to appreciate her help, and, when present at his house, may start cleaning up without permission.  Able to get Pt to realize that he makes handcuff complaints, treating futility as a foregone conclusion.   Characterized the interaction as mutually demoralizing and practiced a few more constructive exchanges, with Pt and TX roleplaying a typical phone call.  Offered to [place] that asking extra questions to try to ask how her son's day was, and doing so because she knows people at his work, are both off the point and distractions to providing him the empathy experience he's asking for.  Far better, if he gives her short, unrevealing answers, to read the moment and ask if he'd rather not talk today.  Addressed her concern that if she doesn't try to draw him out it turns to estrangement until she dies; assured otherwise, that allowing Ovila to shut down when he feels like it and explicitly agreeing, and showing she won't pursue him uncomfortably, allow him to better open up the next time and initiate as he wants to, and reassured that even if she is frightened   Agreed to resume jointly next session if Pt is open to it.  Noted concern from mother about housekeeping and depression, and ill-advised side concern about persuading Pt he has been alcoholic longer than he thinks.    Therapeutic modalities: Cognitive Behavioral Therapy, Solution-Oriented/Positive Psychology, Assertiveness/Communication and Systems analyst  Mental Status/Observations:  Appearance:   Casual     Behavior:  Blaming  Motor:  Normal  Speech/Language:   Clear and Coherent  Affect:  Appropriate  Mood:  dysthymic and irritable  Thought process:  normal  Thought content:    Rumination  Sensory/Perceptual disturbances:    WNL  Orientation:  Fully oriented  Attention:  Fair    Concentration:  Fair  Memory:  WNL  Insight:    Fair  Judgment:   Good  Impulse Control:  Fair   Risk Assessment: Danger to Self: No Self-injurious Behavior: No Danger to Others: No Physical Aggression / Violence: No Duty to Warn: No Access to Firearms a concern: No  Assessment of progress:  stabilized  Diagnosis:   ICD-10-CM   1. Major  depressive disorder with single episode, in partial remission ([place])  F32.4   2. GAD (generalized anxiety disorder)  F41.1   3. Alcohol use disorder, moderate, in early remission, dependence ([place])  F10.21   4. Relationship problem between parent and child  Z62.820    Plan:  . Continue dyad therapy next time if able . M to practice just listening for feelings and perception-checking, try to edit out problem-solving and analyzing impulses, and allow Felder to stop conversations when he wishes . Pt try to give M opportunities, listen for incremental improvements in empathy and not taking over and affirm them, especially given the task of shaping an elder's behavior . Other recommendations/advice as may be noted above . Continue to utilize previously learned skills ad lib . Maintain medication as prescribed and work faithfully with relevant prescriber(s) if any changes are desired or seem indicated . Call the clinic on-call service, present to ER, or call 911 if any life-threatening psychiatric crisis Return in about 2 weeks (around 12/02/2019) for time as available. . Already scheduled visit in this office 11/19/2019.  Blanchie Serve, PhD [name], PhD LP Clinical Psychologist, [facility] [address] [place], [place] [address] [phone]

## 2019-11-19 ENCOUNTER — Ambulatory Visit (INDEPENDENT_AMBULATORY_CARE_PROVIDER_SITE_OTHER): Payer: Medicare Other | Admitting: Psychiatry

## 2019-11-19 ENCOUNTER — Encounter: Payer: Self-pay | Admitting: Psychiatry

## 2019-11-19 DIAGNOSIS — G4733 Obstructive sleep apnea (adult) (pediatric): Secondary | ICD-10-CM

## 2019-11-19 DIAGNOSIS — F324 Major depressive disorder, single episode, in partial remission: Secondary | ICD-10-CM | POA: Diagnosis not present

## 2019-11-19 DIAGNOSIS — F411 Generalized anxiety disorder: Secondary | ICD-10-CM

## 2019-11-19 DIAGNOSIS — F1021 Alcohol dependence, in remission: Secondary | ICD-10-CM

## 2019-11-19 NOTE — Progress Notes (Signed)
Jonathon Walker [id number] 1965-04-12 54 y.o.  Subjective:   Patient ID:  Jonathon Walker is a 54 y.o. (DOB 01-Nov-1965) male.  Chief Complaint:  Chief Complaint  Patient presents with  . Follow-up  . Anxiety  . Depression  . Medication Problem    Depression        Associated symptoms include no decreased concentration and no suicidal ideas.  Past medical history includes anxiety.   Anxiety Symptoms include nervous/anxious behavior. Patient reports no confusion, decreased concentration or suicidal ideas.       Jonathon Walker presents to the office today for follow-up of anxiety , depression, alcohol.    seen August 7.  His anxiety was unmanaged at Paxil 40 mg and olanzapine 5 mg so olanzapine was increased to 7.5 daily.  seen December 17, 2018.  His anxiety was unmanaged.  The following change was made: Trial 1-1/2 of the 7.5 mg tablets of olanzapine for anxiety.  He is had a stay at [facility] for alcohol dependence since he was last here. Stayed 28 days and DC before Thanksgiving.  Very helpful.  Was having NM nighly before and they stopped since then.  Not drinking.  Involved in AA since then.  seen March 25, 2019.  The patient was doing better requested a reduction in olanzapine to 7.5 mg nightly to improve energy.  visit April 03, 2019.  He had remained sober and his anxiety was improved with sobriety.  He was having problems with low motivation and forgetfulness.  At the next refill it was decided olanzapine would be reduced from 7.5 to 5 mg daily. He called requesting this urgent appointment because of worsening symptoms associated with returning to work.  seen May 21, 2019.  He was doing relatively well.  The following was noted: Tolerating Wellbutrin and it seems to help. He didn't tolerate the increase. Anxiety is improved off alcohol.  Ok to reduce olanzapine to 2.5 mg daily for a month and stop it.  Hopefully low motivation will improve.   He called back  March 23 stating he was more depressed and having a harder time doing routine tasks and having suicidal thoughts.  Because of worsening depression after reducing the olanzapine he was encouraged to increase olanzapine back to 5 mg nightly. He called again March 24 stating he was really struggling but was scheduled for an appointment on March 26.  As of June 14, 2019 he reports the following: Every day a real hard struggle to get through the days.  More anxious and depressed equally. Worse 2 weeks.  Last Saturday so overwhelmed by how he feels he had SI.  Everything is hard. Initial benefit paroxetine has been lost.  Awakens stressed and tired. Adequate hours of sleep.  Will be major chore just to mow grass.  Still sober. SE sexual. Getting appt with CPAP doc. Changes made include: Increasing olanzapine back to 5 mg daily a couple of days prior to the appointment due to phone call and the addition of lithium 300 mg for a few days then increase to 600 mg nightly both to augment the antidepressant and because of suicidal thoughts.  July 15, 2019 appointment, the following is noted: Doing OK with both depression and anxiety.  Kind of like a top always going in mind.  Even when I sleep, when awakens mind still going.  NM nightly for years but less than before stopped drinking.  Smaller and less severe ones that don't wake him.  Theme  can't finish things.  Both depression and anxiety 3/10.  More productive.  Paces himself.  Can live with it.  Sleep 9 hours.  Initially olanzapine stopped the ruminating but it's back some.  Overall still benefit.  No SI and can't rmember why he had them before. Residual energy not great and some ruminating. He had some concerns about sexual side effects from Paxil but agreed that no med changes were necessary despite residual symptoms of depression and anxiety.  08/14/2019 appointment, the following is noted:  He scheduled urgently. Vaccinated. Angry easily and exhausted and  everything is a chore.  Exhausted. Never had appt with CPAP company.  CPAP is 55 years old and on same settings as in the past.  CPAP machine is not working properly but when it did it was helpful for alertness and energy.  Disc need to call them to have it evaluated. To bed 10-7.  Don't feel rested in morning but used to feel rested when first started paroxetine. Plan: Increase Lithium continue to 900 mg daily for irritability  11/19/19 appt with the following noted: He increased to 900 mg and then reduced it but doesn't know why.  Reduced it to 300 nightly. Irritability much better.  Once anger since here with rage but not outward. Still on paroxetine 40, olanzapine 5, Wellbutrin 75 AM. Getting tired in afternoon and worrying about getting depressed but he's not depressed now.  Don't enjoy things like he used to.  Les interest and excitement. Appetite and sleep are normal.   Still adjusting without alcohol and socialization. Worrying about lack of sex drive with paroxetine. Can still ruminate on things until he gets some reassurance through talking with people.   Seeing Jonathon Moore PhD  On disability for 7-8 years.  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 Xanax, duloxetine, Wellbutrin 75 (max tolerated), Zoloft, Viibryd 60 Abilify, Rexulti. .  Olanzapine 10  lithium  900  Review of Systems:  Review of Systems  Gastrointestinal: Positive for constipation.  Neurological: Negative for tremors and weakness.  Psychiatric/Behavioral: Positive for depression and dysphoric mood. Negative for agitation, behavioral problems, confusion, decreased concentration, hallucinations, self-injury, sleep disturbance and suicidal ideas. The patient is nervous/anxious. The patient is not hyperactive.     Medications: I have reviewed the patient's current medications.  Current Outpatient Medications  Medication Sig Dispense  Refill  . amLODipine (NORVASC) 2.5 MG tablet TAKE 1 TABLET(2.5 MG) BY MOUTH DAILY 90 tablet 3  . atorvastatin (LIPITOR) 40 MG tablet TAKE 1 TABLET(40 MG) BY MOUTH DAILY 90 tablet 3  . buPROPion (WELLBUTRIN) 75 MG tablet TAKE 1 TABLET(75 MG) BY MOUTH EVERY MORNING 90 tablet 0  . Cyanocobalamin (VITAMIN B-12 PO) Take by mouth.    [name] lisinopril (ZESTRIL) 20 MG tablet TAKE 1 TABLET(20 MG) BY MOUTH DAILY 90 tablet 2  . lithium carbonate 300 MG capsule TAKE 2 CAPSULES(600 MG) BY MOUTH AT BEDTIME (Patient taking differently: Take 300 mg by mouth daily in the afternoon. ) 180 capsule 0  . OLANZapine (ZYPREXA) 5 MG tablet TAKE 1 TABLET(5 MG) BY MOUTH AT BEDTIME 30 tablet 1  . PARoxetine (PAXIL) 40 MG tablet TAKE 1 TABLET(40 MG) BY MOUTH DAILY 90 tablet 0   No current facility-administered medications for this visit.    Medication Side Effects: sexual,  Allergies: No Known Allergies  Past Medical History:  Diagnosis Date  . Anxiety   . Bimalleolar  fracture of left ankle   . Depression   . Heart murmur   . Sleep apnea    uses CPAP nightly    Family History  Problem Relation Age of Onset  . Cancer Maternal Grandfather   . Colon cancer Neg Hx   . Colon polyps Neg Hx   . Esophageal cancer Neg Hx   . Rectal cancer Neg Hx   . Stomach cancer Neg Hx     Social History   Socioeconomic History  . Marital status: Single    Spouse name: Not on file  . Number of children: Not on file  . Years of education: Not on file  . Highest education level: Not on file  Occupational History  . Not on file  Tobacco Use  . Smoking status: Current Every Day Smoker    Packs/day: 1.50    Years: 0.00    Pack years: 0.00    Types: Cigarettes  . Smokeless tobacco: Never Used  Vaping Use  . Vaping Use: Never used  Substance and Sexual Activity  . Alcohol use: Not Currently  . Drug use: No  . Sexual activity: Not on file  Other Topics Concern  . Not on file  Social History Narrative  . Not on file    Social Determinants of Health   Financial Resource Strain:   . Difficulty of Paying Living Expenses: Not on file  Food Insecurity:   . Worried About Charity fundraiser in the Last Year: Not on file  . Ran Out of Food in the Last Year: Not on file  Transportation Needs:   . Lack of Transportation (Medical): Not on file  . Lack of Transportation (Non-Medical): Not on file  Physical Activity:   . Days of Exercise per Week: Not on file  . Minutes of Exercise per Session: Not on file  Stress:   . Feeling of Stress : Not on file  Social Connections:   . Frequency of Communication with Friends and Family: Not on file  . Frequency of Social Gatherings with Friends and Family: Not on file  . Attends Religious Services: Not on file  . Active Member of Clubs or Organizations: Not on file  . Attends Archivist Meetings: Not on file  . Marital Status: Not on file  Intimate Partner Violence:   . Fear of Current or Ex-Partner: Not on file  . Emotionally Abused: Not on file  . Physically Abused: Not on file  . Sexually Abused: Not on file    Past Medical History, Surgical history, Social history, and Family history were reviewed and updated as appropriate.   Please see review of systems for further details on the patient's review from today.   Objective:   Physical Exam:  There were no vitals taken for this visit.  Physical Exam Constitutional:      General: He is not in acute distress.    Appearance: Normal appearance. He is well-developed.     Comments: Red face  Musculoskeletal:        General: No deformity.  Neurological:     Mental Status: He is alert and oriented to person, place, and time.     Motor: No tremor.     Coordination: Coordination normal.     Gait: Gait normal.  Psychiatric:        Attention and Perception: Attention normal. He is attentive.        Mood and Affect: Mood is anxious and depressed. Affect is  not labile, blunt, angry or inappropriate.         Speech: Speech normal.        Behavior: Behavior normal. Behavior is not agitated.        Thought Content: Thought content normal. Thought content does not include homicidal or suicidal ideation. Thought content does not include homicidal or suicidal plan.        Cognition and Memory: Cognition normal.        Judgment: Judgment normal.     Comments: Insight  Fair. Some chronic urgency       Lab Review:     Component Value Date/Time   NA 139 06/13/2019 0820   K 4.2 06/13/2019 0820   CL 104 06/13/2019 0820   CO2 28 06/13/2019 0820   GLUCOSE 109 (H) 06/13/2019 0820   BUN 19 06/13/2019 0820   CREATININE 1.14 06/13/2019 0820   CALCIUM 9.6 06/13/2019 0820   PROT 7.0 06/13/2019 0820   ALBUMIN 4.4 06/13/2019 0820   AST 26 06/13/2019 0820   ALT 42 06/13/2019 0820   ALKPHOS 107 06/13/2019 0820   BILITOT 0.4 06/13/2019 0820       Component Value Date/Time   WBC 5.5 06/13/2019 0820   RBC 4.66 06/13/2019 0820   HGB 14.8 06/13/2019 0820   HGB 15.2 05/05/2010 1049   HCT 42.7 06/13/2019 0820   HCT 44.8 05/05/2010 1049   PLT 230.0 06/13/2019 0820   PLT 193 05/05/2010 1049   MCV 91.6 06/13/2019 0820   MCV 89.6 05/05/2010 1049   MCH 30.4 05/05/2010 1049   MCHC 34.6 06/13/2019 0820   RDW 12.7 06/13/2019 0820   RDW 13.1 05/05/2010 1049   LYMPHSABS 2.1 06/13/2019 0820   LYMPHSABS 1.8 05/05/2010 1049   MONOABS 0.5 06/13/2019 0820   MONOABS 0.4 05/05/2010 1049   EOSABS 0.3 06/13/2019 0820   EOSABS 0.2 05/05/2010 1049   BASOSABS 0.0 06/13/2019 0820   BASOSABS 0.0 05/05/2010 1049    No results found for: POCLITH, LITHIUM   No results found for: PHENYTOIN, PHENOBARB, VALPROATE, CBMZ   .res Assessment: Plan:     Josealberto was seen today for follow-up, anxiety, depression and medication problem.  Diagnoses and all orders for this visit:  Major depressive disorder with single episode, in partial remission ([place])  GAD (generalized anxiety disorder)  Alcohol use disorder,  moderate, in early remission, dependence ([facility])  OSA (obstructive sleep apnea)    Patient seen for urgent appointment today.  At appointment June 14, 2019 patient was markedly more depressed and anxious somewhat unexpectedly.  He is remained sober.  It was felt that his alcohol was a major driving force for his depression and anxiety but he has remained sober and has had a worsening depression and anxiety.  He now states he probably had more symptoms at his last visit than the note indicated but he thought it would resolve.  Instead it got worse.  We had reduced the olanzapine at the prior visit.  That could be a significant attribute  to his recent worsening.  Some anhedonia. Therefore at the visit June 14, 2019 we had increase olanzapine back to 5 mg daily and also added lithium 600 mg daily both to potentiate the antidepressant and because of suicidal thoughts.  He does not appear overtly manic but he is complaining of irritability.  He is also tired.  Suspect bipolar traits due to various soft signs. He is using the same CPAP machine from 10 years ago on the same  settings and has not had it checked.  Strongly suggests suggest that he get in touch with the company and have them checked the readings on the machine to see if he is getting proper benefit and perhaps consider getting a newer machine. Continue CPAP use. He gets some benefit with it but machine is old and needs to be replaced. When he had better functioning CPAP machine it helped his alertness, mood, productivity and concentration.  He wonders if he needs an adjustment bc low energy.  Agree with getting it evaluated.  Call the company.    Examined prior med history which have included SSRIs, SNRIs, partial dopamine agonists, atypical antidepressant Viibryd, olanzapine, buspirone.  We do not want to repeat any of those types.  He has developed treatment resistant depression and anxiety.  Option potentiate lithium, switch TCA, Vraylar,  Latuda, switch Seroquel.  Disc SE each. Continue lithium 300 vs increase 900 to help with depressive sx. He understands this will not help if the primary problem is poor control of sleep apnea. Counseled patient regarding potential benefits, risks, and side effects of lithium to include potential risk of lithium affecting thyroid and renal function.  Discussed need for periodic lab monitoring to determine drug level and to assess for potential adverse effects.  Counseled patient regarding signs and symptoms of lithium toxicity and advised that they notify office immediately or seek urgent medical attention if experiencing these signs and symptoms.  Patient advised to contact office with any questions or concerns. He agrees to the low-dose lithium trial.  Discussed potential metabolic side effects associated with atypical antipsychotics, as well as potential risk for movement side effects. Advised pt to contact office if movement side effects occur.  He agrees.  Call if you have any problems with this.  Restart B Complex vitamin Consider checking vitamin levels for cognitive problems if they persist.  Disc how this can relate to alcohol dependence.  He remains sober.  He wants to consider counseling to work through some personal issues as well as address specific symptoms like his anger and irritability.  Continue Paxil.  Did not feel well at 60 mg so we will continue 40 mg.  Likely to lose anxiety benefit if change it. Continue low-dose Wellbutrin 75  Tolerating Wellbutrin and it seems to help. He didn't tolerate the increase.  FU 12 weeks  Lynder Parents, MD, DFAPA   Please see After Visit Summary for patient specific instructions.  Future Appointments  Date Time Provider [place]  12/09/2019  3:00 PM Blanchie Serve, PhD [facility] None  12/24/2019  3:00 PM Blanchie Serve, PhD [facility] None    No orders of the defined types were placed in this encounter.      -------------------------------

## 2019-12-02 ENCOUNTER — Other Ambulatory Visit: Payer: Self-pay | Admitting: Oral Surgery

## 2019-12-02 DIAGNOSIS — D1039 Benign neoplasm of other parts of mouth: Secondary | ICD-10-CM | POA: Diagnosis not present

## 2019-12-09 ENCOUNTER — Ambulatory Visit (INDEPENDENT_AMBULATORY_CARE_PROVIDER_SITE_OTHER): Payer: Medicare Other | Admitting: Psychiatry

## 2019-12-09 ENCOUNTER — Other Ambulatory Visit: Payer: Self-pay

## 2019-12-09 DIAGNOSIS — Z62820 Parent-biological child conflict: Secondary | ICD-10-CM | POA: Diagnosis not present

## 2019-12-09 DIAGNOSIS — F324 Major depressive disorder, single episode, in partial remission: Secondary | ICD-10-CM | POA: Diagnosis not present

## 2019-12-09 DIAGNOSIS — F411 Generalized anxiety disorder: Secondary | ICD-10-CM

## 2019-12-09 DIAGNOSIS — F1021 Alcohol dependence, in remission: Secondary | ICD-10-CM | POA: Diagnosis not present

## 2019-12-09 NOTE — Progress Notes (Signed)
Psychotherapy Progress Note [facility] [name], PhD LP  Patient ID: Jonathon Walker     MRN: [id number] Therapy format: Family therapy w/ patient -- accompanied by Thressa Sheller Date: 12/09/2019      Start: 3:15p     Stop: 4:00p     Time Spent: 45 min Location: In-person   Session narrative (presenting needs, interim history, self-report of stressors and symptoms, applications of prior therapy, status changes, and interventions made in session) Continues to c/o mother being emotionally insensitive, making suggestions when he just wants her to listen.  Processed seemingly intractable dispute between them, which Pt confides already seems futile to continue in joint sessions.  For her part, mother is 89yo, does sound locked into speaking out of both sides of her mouth that Pt is fully adult, doing much better than he was before rehab, and can take care of himself vs. that he needs her detailed help to be healthy and take care of his own affairs.  Complaints of her bringing food near-daily and fetching his mail in addition to being perpetually tone-deaf about his feelings.  For his part, Pt is borrowing from popular description of Asperger's, which does not seem to fit her, and does seem more foreclosed on the possibility of her becoming a helpful listener.  In active session, coached mother to recognize that her own interest is to see Pt handle his own health and affairs, largely b/c she will not be around the rest of his life, equally b/c she wants him to know his autonomy, and brokered agreements as below.  Personally, notes fatigue, thinking maybe medication needs a change.  Should assess sleep quality, nutrition/hydration, and lifestyle as well.  Therapeutic modalities: Cognitive Behavioral Therapy, Solution-Oriented/Positive Psychology and Assertiveness/Communication  Mental Status/Observations:  Appearance:   Casual     Behavior:  Appropriate  Motor:  Normal   Speech/Language:   Clear and Coherent  Affect:  Appropriate  Mood:  frustrated, responsive  Thought process:  normal  Thought content:    WNL, some rumination  Sensory/Perceptual disturbances:    WNL  Orientation:  Fully oriented  Attention:  Good    Concentration:  Fair  Memory:  WNL  Insight:    Fair  Judgment:   Fair  Impulse Control:  Good   Risk Assessment: Danger to Self: No Self-injurious Behavior: No Danger to Others: No Physical Aggression / Violence: No Duty to Warn: No Access to Firearms a concern: No  Assessment of progress:  stabilized  Diagnosis:   ICD-10-CM   1. Major depressive disorder with single episode, in partial remission ([place])  F32.4   2. GAD (generalized anxiety disorder)  F41.1   3. Alcohol use disorder, moderate, in early remission, dependence ([place])  F10.21   4. Relationship problem between parent and child  Z62.820    Plan:   Agreement on mail -- Jonathon Walker will leave it for Jonathon Walker to get himself.  If worried he will neglect it, she can let him know mail is in the box.  Jonathon Walker will get his own mail and not take it provocatively if Jonathon Walker mentions it.  Agreement on bills -- Jonathon Walker will handle them, Jonathon Walker Jonathon that the pat problem is the past problem and stay out unless actually asked in the future  Agreement on nutrition -- Jonathon Walker's word that he has vitamins, does eat better than he was, at most just ask if he needs anything  Agreement on how to set boundaries -- Jonathon Walker  can ask Jonathon Walker to please stop, let him try things for himself, try to acknowledge she wants to be helpful and the most helpful things will be to let him try  Encourage Jonathon Walker to just listen when Jonathon Walker wants to share, if he wants to hare, do not offer any suggestions of any kind b/c he is "allergic" to them by now and they will backfire.  Jonathon Walker directed to stop rehearsing his disappointment about it, make sure he has other listeners, e.g., through Jonathon Walker fellowship  Return to individual  session next time  Other recommendations/advice as may be noted above  Continue to utilize previously learned skills ad lib  Maintain medication as prescribed and work faithfully with relevant prescriber(s) if any changes are desired or seem indicated  Call the clinic on-call service, present to ER, or call 911 if any life-threatening psychiatric crisis Return in about 2 weeks (around 12/23/2019).  Already scheduled visit in this office 12/24/2019.  Blanchie Serve, PhD [name], PhD LP Clinical Psychologist, [facility] [address] [place], [place] [address] [phone]

## 2019-12-13 ENCOUNTER — Other Ambulatory Visit: Payer: Self-pay | Admitting: Psychiatry

## 2019-12-13 DIAGNOSIS — F324 Major depressive disorder, single episode, in partial remission: Secondary | ICD-10-CM

## 2019-12-13 DIAGNOSIS — F411 Generalized anxiety disorder: Secondary | ICD-10-CM

## 2019-12-24 ENCOUNTER — Other Ambulatory Visit: Payer: Self-pay

## 2019-12-24 ENCOUNTER — Ambulatory Visit: Payer: Medicare Other | Admitting: Psychiatry

## 2019-12-24 DIAGNOSIS — Z62820 Parent-biological child conflict: Secondary | ICD-10-CM | POA: Diagnosis not present

## 2019-12-24 DIAGNOSIS — F1021 Alcohol dependence, in remission: Secondary | ICD-10-CM

## 2019-12-24 DIAGNOSIS — F324 Major depressive disorder, single episode, in partial remission: Secondary | ICD-10-CM | POA: Diagnosis not present

## 2019-12-24 NOTE — Progress Notes (Signed)
Psychotherapy Progress Note [facility] [name], PhD LP  Patient ID: Jonathon Walker     MRN: [id number] Therapy format: Individual psychotherapy Date: 12/24/2019      Start: 3:15p     Stop: 3:55p     Time Spent: 40 min Location: In-person   Session narrative (presenting needs, interim history, self-report of stressors and symptoms, applications of prior therapy, status changes, and interventions made in session) Finished at present with trying to get his mother to be more understanding and empathetic.  Says she is respecting the agreement to let him get his own mail, as an adult.  Otherwise, she seems hopeless to him for understanding his desire to be listened to with empathy and has concluded she is just not capable.  Becoming more OK with it.  Driver's license reactivated, has the blow-n-go device now, with restriction on causes for driving.  Had to take 2nd car off the road to spare having to put 2nd device on it.  Accepting of legal standards, even though he thinks there should be an accommodation for cost with a 2nd car and freedom to drive for recreational purposes, like camping, which is something he enjoys, and natural enjoyment helps vs. the impulse to drink.  Generally, feels more alone with his own devices, like he was first 5 years moved her from [place].  Identified friends all drink, so has to put them on hold.  Discussed social support available.  Remains consistent with AA meetings.  Has a sponsor now, in [place], makes nightly calls with step study on Saturdays.  3 weeks in so far.  Has gotten him an understanding that PT's attention span really can only last about 45 min. and making adjustments.  At this time believes he has done what he came to therapy for, was primarily about assessing and making decisions in relationship with mother.     Therapeutic modalities: Cognitive Behavioral Therapy and Solution-Oriented/Positive Psychology  Mental  Status/Observations:  Appearance:   Casual     Behavior:  Resistant  Motor:  Normal  Speech/Language:   Clear and Coherent  Affect:  Appropriate  Mood:  dysthymic  Thought process:  normal  Thought content:    WNL  Sensory/Perceptual disturbances:    WNL  Orientation:  Fully oriented  Attention:  Fair    Concentration:  Good  Memory:  WNL  Insight:    Fair  Judgment:   Good  Impulse Control:  Fair   Risk Assessment: Danger to Self: No Self-injurious Behavior: No Danger to Others: No Physical Aggression / Violence: No Duty to Warn: No Access to Firearms a concern: No  Assessment of progress:  stabilized  Diagnosis:   ICD-10-CM   1. Major depressive disorder with single episode, in partial remission ([place])  F32.4   2. Relationship problem between parent and child  Z62.820   3. Alcohol use disorder, moderate, in early remission, dependence ([place])  F10.21    Plan:  . Maintain involvement with AA . Encouraged to consider further goals of therapy, as he has not yet proficient at fully managing current lifestyle and provisions of sober living, nor certain to be able to prevent relapse acceptance of far as he has legal requirements with his driver's license and expenses attached.  Will most likely need at least episodic contact to improve communication and management of resentful feelings with his mother. . Other recommendations/advice as may be noted above . Continue to utilize previously learned skills ad lib .  Maintain medication as prescribed and work faithfully with relevant prescriber(s) if any changes are desired or seem indicated . Call the clinic on-call service, present to ER, or call 911 if any life-threatening psychiatric crisis Return in about 23 days (around 01/16/2020) for session(s) already scheduled -- pledges to consider and cancel with fair notice if declining. . Already scheduled visit in this office 01/16/2020.  Blanchie Serve, PhD [name], PhD LP Clinical  Psychologist, [facility] [address], [place], [place] [address] [phone]

## 2020-01-04 ENCOUNTER — Other Ambulatory Visit: Payer: Self-pay | Admitting: Psychiatry

## 2020-01-04 DIAGNOSIS — F411 Generalized anxiety disorder: Secondary | ICD-10-CM

## 2020-01-04 DIAGNOSIS — F324 Major depressive disorder, single episode, in partial remission: Secondary | ICD-10-CM

## 2020-01-16 ENCOUNTER — Encounter: Payer: Self-pay | Admitting: Psychiatry

## 2020-01-16 ENCOUNTER — Ambulatory Visit: Payer: Medicare Other | Admitting: Psychiatry

## 2020-01-16 NOTE — Progress Notes (Signed)
Admin note for non-service contact  Patient ID: NIRANJAN RUFENER  MRN: [id number] DATE: 01/16/2020  No show for 3pm.  Had scheduled originally with the possibility of cancelling if he felt it was unnecessary, clearly explained that would just need 1 business day's notice to prevent fee.  Charge normally, may waive if extenuating circumstances.  Blanchie Serve, PhD [name], PhD LP Clinical Psychologist, [facility] [address], [place], [place] [address] [phone]

## 2020-01-23 ENCOUNTER — Other Ambulatory Visit: Payer: Self-pay | Admitting: Psychiatry

## 2020-01-23 DIAGNOSIS — F331 Major depressive disorder, recurrent, moderate: Secondary | ICD-10-CM

## 2020-02-01 ENCOUNTER — Other Ambulatory Visit: Payer: Self-pay | Admitting: Psychiatry

## 2020-02-01 DIAGNOSIS — F331 Major depressive disorder, recurrent, moderate: Secondary | ICD-10-CM

## 2020-02-03 NOTE — Telephone Encounter (Signed)
Clarify dose

## 2020-02-18 ENCOUNTER — Ambulatory Visit (INDEPENDENT_AMBULATORY_CARE_PROVIDER_SITE_OTHER): Payer: Medicare Other | Admitting: Psychiatry

## 2020-02-18 ENCOUNTER — Other Ambulatory Visit: Payer: Self-pay

## 2020-02-18 ENCOUNTER — Encounter: Payer: Self-pay | Admitting: Psychiatry

## 2020-02-18 DIAGNOSIS — F1021 Alcohol dependence, in remission: Secondary | ICD-10-CM

## 2020-02-18 DIAGNOSIS — F324 Major depressive disorder, single episode, in partial remission: Secondary | ICD-10-CM

## 2020-02-18 DIAGNOSIS — F422 Mixed obsessional thoughts and acts: Secondary | ICD-10-CM | POA: Diagnosis not present

## 2020-02-18 DIAGNOSIS — F331 Major depressive disorder, recurrent, moderate: Secondary | ICD-10-CM

## 2020-02-18 DIAGNOSIS — F411 Generalized anxiety disorder: Secondary | ICD-10-CM | POA: Diagnosis not present

## 2020-02-18 MED ORDER — BUPROPION HCL 75 MG PO TABS: 75.0000 mg | ORAL_TABLET | Freq: Every morning | ORAL | 1 refills | Status: DC

## 2020-02-18 MED ORDER — OLANZAPINE 5 MG PO TABS: 5.0000 mg | ORAL_TABLET | Freq: Every day | ORAL | 1 refills | Status: DC

## 2020-02-18 MED ORDER — PAROXETINE HCL 30 MG PO TABS: 60.0000 mg | ORAL_TABLET | Freq: Every day | ORAL | 1 refills | Status: DC

## 2020-02-18 NOTE — Progress Notes (Signed)
Jonathon Walker [id number] [birth year] 54 y.o.  Subjective:   Patient ID:  Jonathon Walker is a 54 y.o. (DOB [birth year]) male.  Chief Complaint:  Chief Complaint  Patient presents with  . Follow-up  . Depression  . Anxiety    Depression        Associated symptoms include no decreased concentration and no suicidal ideas.  Past medical history includes anxiety.   Anxiety Symptoms include nervous/anxious behavior. Patient reports no confusion, decreased concentration or suicidal ideas.       Jonathon Walker presents to the office today for follow-up of anxiety , depression, alcohol.    seen August 7.  His anxiety was unmanaged at Paxil 40 mg and olanzapine 5 mg so olanzapine was increased to 7.5 daily.  seen December 17, 2018.  His anxiety was unmanaged.  The following change was made: Trial 1-1/2 of the 7.5 mg tablets of olanzapine for anxiety.  He is had a stay at [place] for alcohol dependence since he was last here. Stayed 28 days and DC before Thanksgiving.  Very helpful.  Was having NM nighly before and they stopped since then.  Not drinking.  Involved in AA since then.  seen March 25, 2019.  The patient was doing better requested a reduction in olanzapine to 7.5 mg nightly to improve energy.  visit April 03, 2019.  He had remained sober and his anxiety was improved with sobriety.  He was having problems with low motivation and forgetfulness.  At the next refill it was decided olanzapine would be reduced from 7.5 to 5 mg daily. He called requesting this urgent appointment because of worsening symptoms associated with returning to work.  seen May 21, 2019.  He was doing relatively well.  The following was noted: Tolerating Wellbutrin and it seems to help. He didn't tolerate the increase. Anxiety is improved off alcohol.  Ok to reduce olanzapine to 2.5 mg daily for a month and stop it.  Hopefully low motivation will improve.   He called back March 23 stating he  was more depressed and having a harder time doing routine tasks and having suicidal thoughts.  Because of worsening depression after reducing the olanzapine he was encouraged to increase olanzapine back to 5 mg nightly. He called again March 24 stating he was really struggling but was scheduled for an appointment on March 26.  As of June 14, 2019 he reports the following: Every day a real hard struggle to get through the days.  More anxious and depressed equally. Worse 2 weeks.  Last Saturday so overwhelmed by how he feels he had SI.  Everything is hard. Initial benefit paroxetine has been lost.  Awakens stressed and tired. Adequate hours of sleep.  Will be major chore just to mow grass.  Still sober. SE sexual. Getting appt with CPAP doc. Changes made include: Increasing olanzapine back to 5 mg daily a couple of days prior to the appointment due to phone call and the addition of lithium 300 mg for a few days then increase to 600 mg nightly both to augment the antidepressant and because of suicidal thoughts.  July 15, 2019 appointment, the following is noted: Doing OK with both depression and anxiety.  Kind of like a top always going in mind.  Even when I sleep, when awakens mind still going.  NM nightly for years but less than before stopped drinking.  Smaller and less severe ones that don't wake him.  Theme can't finish things.  Both depression and anxiety 3/10.  More productive.  Paces himself.  Can live with it.  Sleep 9 hours.  Initially olanzapine stopped the ruminating but it's back some.  Overall still benefit.  No SI and can't rmember why he had them before. Residual energy not great and some ruminating. He had some concerns about sexual side effects from Paxil but agreed that no med changes were necessary despite residual symptoms of depression and anxiety.  08/14/2019 appointment, the following is noted:  He scheduled urgently. Vaccinated. Angry easily and exhausted and everything is a  chore.  Exhausted. Never had appt with CPAP company.  CPAP is 55 years old and on same settings as in the past.  CPAP machine is not working properly but when it did it was helpful for alertness and energy.  Disc need to call them to have it evaluated. To bed 10-7.  Don't feel rested in morning but used to feel rested when first started paroxetine. Plan: Increase Lithium continue to 900 mg daily for irritability  11/19/19 appt with the following noted: He increased to 900 mg and then reduced it but doesn't know why.  Reduced it to 300 nightly. Irritability much better.  Once anger since here with rage but not outward. Still on paroxetine 40, olanzapine 5, Wellbutrin 75 AM. Getting tired in afternoon and worrying about getting depressed but he's not depressed now.  Don't enjoy things like he used to.  Les interest and excitement. Appetite and sleep are normal.   Still adjusting without alcohol and socialization. Worrying about lack of sex drive with paroxetine. Can still ruminate on things until he gets some reassurance through talking with people. Plan: Continue low-dose lithium 300 mg daily Continue Paxil.  Did not feel well at 60 mg so we will continue 40 mg.  Likely to lose anxiety benefit if change it. Continue low-dose Wellbutrin 75  Tolerating Wellbutrin and it seems to help. He didn't tolerate the increase.   Continue olanzapine 5 mg  02/18/2020 appointment with following noted: Open to trying increase paroxetine again bc ruminating wears him out and gets fatigued and it wears him out though is 80% better vs before paroxetine. Doesn't remember trying higher paroxetine.  Ruminates on relationships or work.  Whenever he can talk about it with someone it helps tremendously but also realizes paroxetine helps. Anxiety affects dreams and NM too.  Up and down a little.  Anxiety drove depression before paroxetine.  Best med he's tried. SOBER 1 YEAR SE no problem.  Except gained 25# in 4 years.   No change in diet and exercise.  No increase in appetite and no food cravings.  Seeing PCP soon.   Seeing [name] PhD  On disability for 7-8 years.  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 Xanax, duloxetine, Wellbutrin 75 (max tolerated), Zoloft, Viibryd 60 Abilify, Rexulti. .  Olanzapine 10  lithium  900  Review of Systems:  Review of Systems  Constitutional: Positive for unexpected weight change.  Neurological: Negative for tremors and weakness.  Psychiatric/Behavioral: Positive for depression and dysphoric mood. Negative for agitation, behavioral problems, confusion, decreased concentration, hallucinations, self-injury, sleep disturbance and suicidal ideas. The patient is nervous/anxious. The patient is not hyperactive.     Medications: I have reviewed the patient's current medications.  Current Outpatient Medications  Medication Sig Dispense Refill  . amLODipine (NORVASC) 2.5 MG tablet TAKE 1 TABLET(2.5 MG) BY  MOUTH DAILY 90 tablet 3  . atorvastatin (LIPITOR) 40 MG tablet TAKE 1 TABLET(40 MG) BY MOUTH DAILY 90 tablet 3  . buPROPion (WELLBUTRIN) 75 MG tablet Take 1 tablet (75 mg total) by mouth in the morning. 90 tablet 1  . Cyanocobalamin (VITAMIN B-12 PO) Take by mouth.    [name] lisinopril (ZESTRIL) 20 MG tablet TAKE 1 TABLET(20 MG) BY MOUTH DAILY 90 tablet 2  . lithium carbonate 300 MG capsule Take 3 capsules (900 mg total) by mouth at bedtime. (Patient taking differently: Take 600 mg by mouth at bedtime. ) 270 capsule 0  . OLANZapine (ZYPREXA) 5 MG tablet Take 1 tablet (5 mg total) by mouth at bedtime. 90 tablet 1  . PARoxetine (PAXIL) 30 MG tablet Take 2 tablets (60 mg total) by mouth daily. 180 tablet 1   No current facility-administered medications for this visit.    Medication Side Effects: sexual,  Allergies: No Known Allergies  Past Medical History:  Diagnosis Date  . Anxiety   .  Bimalleolar fracture of left ankle   . Depression   . Heart murmur   . Sleep apnea    uses CPAP nightly    Family History  Problem Relation Age of Onset  . Cancer Maternal Grandfather   . Colon cancer Neg Hx   . Colon polyps Neg Hx   . Esophageal cancer Neg Hx   . Rectal cancer Neg Hx   . Stomach cancer Neg Hx     Social History   Socioeconomic History  . Marital status: Single    Spouse name: Not on file  . Number of children: Not on file  . Years of education: Not on file  . Highest education level: Not on file  Occupational History  . Not on file  Tobacco Use  . Smoking status: Current Every Day Smoker    Packs/day: 1.50    Years: 0.00    Pack years: 0.00    Types: Cigarettes  . Smokeless tobacco: Never Used  Vaping Use  . Vaping Use: Never used  Substance and Sexual Activity  . Alcohol use: Not Currently  . Drug use: No  . Sexual activity: Not on file  Other Topics Concern  . Not on file  Social History Narrative  . Not on file   Social Determinants of Health   Financial Resource Strain:   . Difficulty of Paying Living Expenses: Not on file  Food Insecurity:   . Worried About Charity fundraiser in the Last Year: Not on file  . Ran Out of Food in the Last Year: Not on file  Transportation Needs:   . Lack of Transportation (Medical): Not on file  . Lack of Transportation (Non-Medical): Not on file  Physical Activity:   . Days of Exercise per Week: Not on file  . Minutes of Exercise per Session: Not on file  Stress:   . Feeling of Stress : Not on file  Social Connections:   . Frequency of Communication with Friends and Family: Not on file  . Frequency of Social Gatherings with Friends and Family: Not on file  . Attends Religious Services: Not on file  . Active Member of Clubs or Organizations: Not on file  . Attends Archivist Meetings: Not on file  . Marital Status: Not on file  Intimate Partner Violence:   . Fear of Current or  Ex-Partner: Not on file  . Emotionally Abused: Not on file  . Physically Abused: Not  on file  . Sexually Abused: Not on file    Past Medical History, Surgical history, Social history, and Family history were reviewed and updated as appropriate.   Please see review of systems for further details on the patient's review from today.   Objective:   Physical Exam:  There were no vitals taken for this visit.  Physical Exam Constitutional:      General: He is not in acute distress.    Appearance: Normal appearance. He is well-developed.     Comments: Red face  Musculoskeletal:        General: No deformity.  Neurological:     Mental Status: He is alert and oriented to person, place, and time.     Motor: No tremor.     Coordination: Coordination normal.     Gait: Gait normal.  Psychiatric:        Attention and Perception: Attention normal. He is attentive.        Mood and Affect: Mood is anxious. Mood is not depressed. Affect is not labile, blunt, angry or inappropriate.        Speech: Speech normal.        Behavior: Behavior normal. Behavior is not agitated.        Thought Content: Thought content normal. Thought content does not include homicidal or suicidal ideation. Thought content does not include homicidal or suicidal plan.        Cognition and Memory: Cognition normal.        Judgment: Judgment normal.     Comments: Insight  Fair. Reports overall depression and anxiety are 80% improved but still a fair amount of rumination especially at night which affects his sleep.       Lab Review:     Component Value Date/Time   NA 139 06/13/2019 0820   K 4.2 06/13/2019 0820   CL 104 06/13/2019 0820   CO2 28 06/13/2019 0820   GLUCOSE 109 (H) 06/13/2019 0820   BUN 19 06/13/2019 0820   CREATININE 1.14 06/13/2019 0820   CALCIUM 9.6 06/13/2019 0820   PROT 7.0 06/13/2019 0820   ALBUMIN 4.4 06/13/2019 0820   AST 26 06/13/2019 0820   ALT 42 06/13/2019 0820   ALKPHOS 107 06/13/2019  0820   BILITOT 0.4 06/13/2019 0820       Component Value Date/Time   WBC 5.5 06/13/2019 0820   RBC 4.66 06/13/2019 0820   HGB 14.8 06/13/2019 0820   HGB 15.2 05/05/2010 1049   HCT 42.7 06/13/2019 0820   HCT 44.8 05/05/2010 1049   PLT 230.0 06/13/2019 0820   PLT 193 05/05/2010 1049   MCV 91.6 06/13/2019 0820   MCV 89.6 05/05/2010 1049   MCH 30.4 05/05/2010 1049   MCHC 34.6 06/13/2019 0820   RDW 12.7 06/13/2019 0820   RDW 13.1 05/05/2010 1049   LYMPHSABS 2.1 06/13/2019 0820   LYMPHSABS 1.8 05/05/2010 1049   MONOABS 0.5 06/13/2019 0820   MONOABS 0.4 05/05/2010 1049   EOSABS 0.3 06/13/2019 0820   EOSABS 0.2 05/05/2010 1049   BASOSABS 0.0 06/13/2019 0820   BASOSABS 0.0 05/05/2010 1049    No results found for: POCLITH, LITHIUM   No results found for: PHENYTOIN, PHENOBARB, VALPROATE, CBMZ   .res Assessment: Plan:     Mark was seen today for follow-up, depression and anxiety.  Diagnoses and all orders for this visit:  Major depressive disorder with single episode, in partial remission (HCC) -     PARoxetine (PAXIL) 30 MG tablet; Take  2 tablets (60 mg total) by mouth daily. -     OLANZapine (ZYPREXA) 5 MG tablet; Take 1 tablet (5 mg total) by mouth at bedtime.  GAD (generalized anxiety disorder) -     PARoxetine (PAXIL) 30 MG tablet; Take 2 tablets (60 mg total) by mouth daily. -     OLANZapine (ZYPREXA) 5 MG tablet; Take 1 tablet (5 mg total) by mouth at bedtime.  Major depressive disorder, recurrent episode, moderate (HCC) -     buPROPion (WELLBUTRIN) 75 MG tablet; Take 1 tablet (75 mg total) by mouth in the morning.  Mixed obsessional thoughts and acts  Alcohol dependence in remission [facility])    Patient seen for routine appointment today.  At appointment June 14, 2019 patient was markedly more depressed and anxious somewhat unexpectedly.  He is remained sober.  It was felt that his alcohol was a major driving force for his depression and anxiety but he has remained  sober and has had a worsening depression and anxiety.  He now states he probably had more symptoms at his last visit than the note indicated but he thought it would resolve.  Instead it got worse.  We had reduced the olanzapine at the prior visit.  That could have been a cause.   Therefore at the visit June 14, 2019 we had increase olanzapine back to 5 mg daily and also added lithium 600 mg daily both to potentiate the antidepressant and because of suicidal thoughts.  His symptoms of depression have resolved.  He continues to have anxiety symptoms that are most consistent with OCD.  He will obsess on relationship problems and then seek reassurance from others which briefly helps but then he starts obsessing again.  We discussed the value of increasing paroxetine because SSRIs tend to work better for obsessions at higher dose.  He is willing to try that again.  He does not remember trying 60 mg before.  Continue CPAP use. He gets some benefit with it but machine is old and needs to be replaced. When he had better functioning CPAP machine it helped his alertness, mood, productivity and concentration.   Continue lithium 600 to help with depressive sx. He understands this will not help if the primary problem is poor control of sleep apnea. Counseled patient regarding potential benefits, risks, and side effects of lithium to include potential risk of lithium affecting thyroid and renal function.  Discussed need for periodic lab monitoring to determine drug level and to assess for potential adverse effects.  Counseled patient regarding signs and symptoms of lithium toxicity and advised that they notify office immediately or seek urgent medical attention if experiencing these signs and symptoms.  Patient advised to contact office with any questions or concerns. He agrees to the low-dose lithium trial.  Discussed potential metabolic side effects associated with atypical antipsychotics, as well as potential risk  for movement side effects. Advised pt to contact office if movement side effects occur. Disc weight gain risk with olanzapine. He agrees.  Call if you have any problems with this.  He remains sober.  He wants to consider counseling to work through some personal issues as well as address specific symptoms like his anger and irritability.  Increase paroxetine trial to 60 for rumination.  Disc SE. Continue low-dose Wellbutrin 75  Tolerating Wellbutrin and it seems to help. He didn't tolerate the increase.  Continue olanzapine 5 mg nightly it was helpful for depression and anxiety.  FU 12 weeks  Lynder Parents, MD,  DFAPA   Please see After Visit Summary for patient specific instructions.  No future appointments.  No orders of the defined types were placed in this encounter.     -------------------------------

## 2020-04-22 ENCOUNTER — Ambulatory Visit: Payer: Medicare Other

## 2020-04-23 ENCOUNTER — Telehealth: Payer: Self-pay | Admitting: Adult Health

## 2020-04-23 NOTE — Progress Notes (Signed)
  Chronic Care Management   Note  04/23/2020 Name: Jonathon Walker MRN: [id number] DOB: December 10, 1965  Jonathon Walker is a 55 y.o. year old male who is a primary care patient of [name], NP. I reached out to Jonathon Walker by phone today in response to a referral sent by Mr. Santiago Bur Hankey's PCP, [name], NP.   [name] was given information about Chronic Care Management services today including:  1. CCM service includes personalized support from designated clinical staff supervised by his physician, including individualized plan of care and coordination with other care providers 2. 24/7 contact phone numbers for assistance for urgent and routine care needs. 3. Service will only be billed when office clinical staff spend 20 minutes or more in a month to coordinate care. 4. Only one practitioner may furnish and bill the service in a calendar month. 5. The patient may stop CCM services at any time (effective at the end of the month) by phone call to the office staff.   Patient agreed to services and verbal consent obtained.   Follow up plan:   Carley Perdue [employer] Scheduler

## 2020-04-24 DIAGNOSIS — I10 Essential (primary) hypertension: Secondary | ICD-10-CM | POA: Diagnosis not present

## 2020-04-24 DIAGNOSIS — G4733 Obstructive sleep apnea (adult) (pediatric): Secondary | ICD-10-CM | POA: Diagnosis not present

## 2020-04-29 DIAGNOSIS — G4733 Obstructive sleep apnea (adult) (pediatric): Secondary | ICD-10-CM | POA: Diagnosis not present

## 2020-04-29 DIAGNOSIS — I10 Essential (primary) hypertension: Secondary | ICD-10-CM | POA: Diagnosis not present

## 2020-05-08 ENCOUNTER — Telehealth: Payer: Self-pay | Admitting: Adult Health

## 2020-05-08 NOTE — Telephone Encounter (Signed)
Form received and placed in Cory's folder.

## 2020-05-08 NOTE — Telephone Encounter (Signed)
Patient dropped off [employer] form  Fax to: [phone]  Disposition: Dr's Folder

## 2020-05-09 ENCOUNTER — Other Ambulatory Visit: Payer: Self-pay | Admitting: Adult Health

## 2020-05-09 DIAGNOSIS — I10 Essential (primary) hypertension: Secondary | ICD-10-CM

## 2020-05-12 NOTE — Telephone Encounter (Signed)
Sent to the pharmacy by e-scribe. 

## 2020-05-14 NOTE — Telephone Encounter (Signed)
Form faxed and received confirmation the transaction was successful.  Nothing further needed.

## 2020-05-22 DIAGNOSIS — I10 Essential (primary) hypertension: Secondary | ICD-10-CM | POA: Diagnosis not present

## 2020-05-22 DIAGNOSIS — G4733 Obstructive sleep apnea (adult) (pediatric): Secondary | ICD-10-CM | POA: Diagnosis not present

## 2020-05-26 ENCOUNTER — Ambulatory Visit (INDEPENDENT_AMBULATORY_CARE_PROVIDER_SITE_OTHER): Payer: Medicare Other | Admitting: Psychiatry

## 2020-05-26 ENCOUNTER — Encounter: Payer: Self-pay | Admitting: Psychiatry

## 2020-05-26 ENCOUNTER — Other Ambulatory Visit: Payer: Self-pay

## 2020-05-26 DIAGNOSIS — F411 Generalized anxiety disorder: Secondary | ICD-10-CM

## 2020-05-26 DIAGNOSIS — F324 Major depressive disorder, single episode, in partial remission: Secondary | ICD-10-CM

## 2020-05-26 DIAGNOSIS — F422 Mixed obsessional thoughts and acts: Secondary | ICD-10-CM

## 2020-05-26 DIAGNOSIS — F1021 Alcohol dependence, in remission: Secondary | ICD-10-CM

## 2020-05-26 NOTE — Patient Instructions (Addendum)
Reduce lithium by 1 tablet per week.  Call if there is any suicidal thought.  Starting April 1 reduce paroxetine to 40 mg daily.

## 2020-05-26 NOTE — Progress Notes (Signed)
Jonathon Walker [id number] [birth year] 55 y.o.  Subjective:   Patient ID:  Jonathon Walker is a 55 y.o. (DOB [birth year]) male.  Chief Complaint:  Chief Complaint  Patient presents with  . Follow-up  . Major depressive disorder with single episode, in partial r  . Anxiety    Depression        Associated symptoms include no decreased concentration and no suicidal ideas.  Past medical history includes anxiety.   Anxiety Symptoms include nervous/anxious behavior. Patient reports no confusion, decreased concentration or suicidal ideas.       Jonathon Walker presents to the office today for follow-up of anxiety , depression, alcohol.    seen August 7.  His anxiety was unmanaged at Paxil 40 mg and olanzapine 5 mg so olanzapine was increased to 7.5 daily.  seen December 17, 2018.  His anxiety was unmanaged.  The following change was made: Trial 1-1/2 of the 7.5 mg tablets of olanzapine for anxiety.  He is had a stay at [facility] for alcohol dependence since he was last here. Stayed 28 days and DC before Thanksgiving.  Very helpful.  Was having NM nighly before and they stopped since then.  Not drinking.  Involved in AA since then.  seen March 25, 2019.  The patient was doing better requested a reduction in olanzapine to 7.5 mg nightly to improve energy.  visit April 03, 2019.  He had remained sober and his anxiety was improved with sobriety.  He was having problems with low motivation and forgetfulness.  At the next refill it was decided olanzapine would be reduced from 7.5 to 5 mg daily. He called requesting this urgent appointment because of worsening symptoms associated with returning to work.  seen May 21, 2019.  He was doing relatively well.  The following was noted: Tolerating Wellbutrin and it seems to help. He didn't tolerate the increase. Anxiety is improved off alcohol.  Ok to reduce olanzapine to 2.5 mg daily for a month and stop it.  Hopefully low motivation will  improve.   He called back March 23 stating he was more depressed and having a harder time doing routine tasks and having suicidal thoughts.  Because of worsening depression after reducing the olanzapine he was encouraged to increase olanzapine back to 5 mg nightly. He called again March 24 stating he was really struggling but was scheduled for an appointment on March 26.  As of June 14, 2019 he reports the following: Every day a real hard struggle to get through the days.  More anxious and depressed equally. Worse 2 weeks.  Last Saturday so overwhelmed by how he feels he had SI.  Everything is hard. Initial benefit paroxetine has been lost.  Awakens stressed and tired. Adequate hours of sleep.  Will be major chore just to mow grass.  Still sober. SE sexual. Getting appt with CPAP doc. Changes made include: Increasing olanzapine back to 5 mg daily a couple of days prior to the appointment due to phone call and the addition of lithium 300 mg for a few days then increase to 600 mg nightly both to augment the antidepressant and because of suicidal thoughts.  July 15, 2019 appointment, the following is noted: Doing OK with both depression and anxiety.  Kind of like a top always going in mind.  Even when I sleep, when awakens mind still going.  NM nightly for years but less than before stopped drinking.  Smaller and less severe ones that don't  wake him.  Theme can't finish things.  Both depression and anxiety 3/10.  More productive.  Paces himself.  Can live with it.  Sleep 9 hours.  Initially olanzapine stopped the ruminating but it's back some.  Overall still benefit.  No SI and can't rmember why he had them before. Residual energy not great and some ruminating. He had some concerns about sexual side effects from Paxil but agreed that no med changes were necessary despite residual symptoms of depression and anxiety.  08/14/2019 appointment, the following is noted:  He scheduled urgently. Vaccinated.  Angry easily and exhausted and everything is a chore.  Exhausted. Never had appt with CPAP company.  CPAP is 56 years old and on same settings as in the past.  CPAP machine is not working properly but when it did it was helpful for alertness and energy.  Disc need to call them to have it evaluated. To bed 10-7.  Don't feel rested in morning but used to feel rested when first started paroxetine. Plan: Increase Lithium continue to 900 mg daily for irritability  11/19/19 appt with the following noted: He increased to 900 mg and then reduced it but doesn't know why.  Reduced it to 300 nightly. Irritability much better.  Once anger since here with rage but not outward. Still on paroxetine 40, olanzapine 5, Wellbutrin 75 AM. Getting tired in afternoon and worrying about getting depressed but he's not depressed now.  Don't enjoy things like he used to.  Les interest and excitement. Appetite and sleep are normal.   Still adjusting without alcohol and socialization. Worrying about lack of sex drive with paroxetine. Can still ruminate on things until he gets some reassurance through talking with people. Plan: Continue low-dose lithium 300 mg daily Continue Paxil.  Did not feel well at 60 mg so we will continue 40 mg.  Likely to lose anxiety benefit if change it. Continue low-dose Wellbutrin 75  Tolerating Wellbutrin and it seems to help. He didn't tolerate the increase.   Continue olanzapine 5 mg  02/18/2020 appointment with following noted: Open to trying increase paroxetine again bc ruminating wears him out and gets fatigued and it wears him out though is 80% better vs before paroxetine. Doesn't remember trying higher paroxetine.  Ruminates on relationships or work.  Whenever he can talk about it with someone it helps tremendously but also realizes paroxetine helps. Anxiety affects dreams and NM too.  Up and down a little.  Anxiety drove depression before paroxetine.  Best med he's tried. SOBER 1  YEAR SE no problem.  Except gained 25# in 4 years.  No change in diet and exercise.  No increase in appetite and no food cravings.  Seeing PCP soon. Plan: Increase paroxetine trial to 60 for rumination.  Disc SE.  05/26/20 appt noted: No benefit increase paroxetine nor SE.  Wants to reduce back to 30 mg paroxetine and stop lithium bc it didn't seem to help.  Also wants to  4 days of Calm app has seemed to help rumination.  Does it in afternoon and before bed and it seemed to helps.  Doing a 10 min meditation and it helps.   Dep 4/10.  Anxiety 4/10. And a lot better the last few days.     Seeing [name] PhD  On disability for 7-8 years.  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 Xanax, duloxetine, Wellbutrin 75 (max  tolerated), Zoloft, Viibryd 60 Abilify, Rexulti. .  Olanzapine 10  lithium  900  Review of Systems:  Review of Systems  Constitutional: Negative for unexpected weight change.  Neurological: Negative for tremors and weakness.  Psychiatric/Behavioral: Positive for depression and dysphoric mood. Negative for agitation, behavioral problems, confusion, decreased concentration, hallucinations, self-injury, sleep disturbance and suicidal ideas. The patient is nervous/anxious. The patient is not hyperactive.     Medications: I have reviewed the patient's current medications.  Current Outpatient Medications  Medication Sig Dispense Refill  . amLODipine (NORVASC) 2.5 MG tablet TAKE 1 TABLET(2.5 MG) BY MOUTH DAILY 90 tablet 3  . atorvastatin (LIPITOR) 40 MG tablet TAKE 1 TABLET(40 MG) BY MOUTH DAILY 90 tablet 3  . buPROPion (WELLBUTRIN) 75 MG tablet Take 1 tablet (75 mg total) by mouth in the morning. 90 tablet 1  . lisinopril (ZESTRIL) 20 MG tablet TAKE 1 TABLET(20 MG) BY MOUTH DAILY 90 tablet 0  . OLANZapine (ZYPREXA) 5 MG tablet Take 1 tablet (5 mg total) by mouth at bedtime. 90 tablet 1  . PARoxetine  (PAXIL) 30 MG tablet Take 2 tablets (60 mg total) by mouth daily. 180 tablet 1  . Cyanocobalamin (VITAMIN B-12 PO) Take by mouth. (Patient not taking: Reported on 05/26/2020)     No current facility-administered medications for this visit.    Medication Side Effects: sexual,  Allergies: No Known Allergies  Past Medical History:  Diagnosis Date  . Anxiety   . Bimalleolar fracture of left ankle   . Depression   . Heart murmur   . Sleep apnea    uses CPAP nightly    Family History  Problem Relation Age of Onset  . Cancer Maternal Grandfather   . Colon cancer Neg Hx   . Colon polyps Neg Hx   . Esophageal cancer Neg Hx   . Rectal cancer Neg Hx   . Stomach cancer Neg Hx     Social History   Socioeconomic History  . Marital status: Single    Spouse name: Not on file  . Number of children: Not on file  . Years of education: Not on file  . Highest education level: Not on file  Occupational History  . Not on file  Tobacco Use  . Smoking status: Current Every Day Smoker    Packs/day: 1.50    Years: 0.00    Pack years: 0.00    Types: Cigarettes  . Smokeless tobacco: Never Used  Vaping Use  . Vaping Use: Never used  Substance and Sexual Activity  . Alcohol use: Not Currently  . Drug use: No  . Sexual activity: Not on file  Other Topics Concern  . Not on file  Social History Narrative  . Not on file   Social Determinants of Health   Financial Resource Strain: Not on file  Food Insecurity: Not on file  Transportation Needs: Not on file  Physical Activity: Not on file  Stress: Not on file  Social Connections: Not on file  Intimate Partner Violence: Not on file    Past Medical History, Surgical history, Social history, and Family history were reviewed and updated as appropriate.   Please see review of systems for further details on the patient's review from today.   Objective:   Physical Exam:  There were no vitals taken for this visit.  Physical  Exam Constitutional:      General: He is not in acute distress.    Appearance: Normal appearance. He is well-developed.  Comments: Red face  Musculoskeletal:        General: No deformity.  Neurological:     Mental Status: He is alert and oriented to person, place, and time.     Motor: No tremor.     Coordination: Coordination normal.     Gait: Gait normal.  Psychiatric:        Attention and Perception: Attention normal. He is attentive.        Mood and Affect: Mood is anxious and depressed. Affect is not labile, blunt, angry or inappropriate.        Speech: Speech normal.        Behavior: Behavior normal. Behavior is not agitated.        Thought Content: Thought content normal. Thought content does not include homicidal or suicidal ideation. Thought content does not include homicidal or suicidal plan.        Cognition and Memory: Cognition normal.        Judgment: Judgment normal.     Comments: Insight  Fair. Reports overall depression and anxiety are 80% improved .  Less rumination with meditation so far.        Lab Review:     Component Value Date/Time   NA 139 06/13/2019 0820   K 4.2 06/13/2019 0820   CL 104 06/13/2019 0820   CO2 28 06/13/2019 0820   GLUCOSE 109 (H) 06/13/2019 0820   BUN 19 06/13/2019 0820   CREATININE 1.14 06/13/2019 0820   CALCIUM 9.6 06/13/2019 0820   PROT 7.0 06/13/2019 0820   ALBUMIN 4.4 06/13/2019 0820   AST 26 06/13/2019 0820   ALT 42 06/13/2019 0820   ALKPHOS 107 06/13/2019 0820   BILITOT 0.4 06/13/2019 0820       Component Value Date/Time   WBC 5.5 06/13/2019 0820   RBC 4.66 06/13/2019 0820   HGB 14.8 06/13/2019 0820   HGB 15.2 05/05/2010 1049   HCT 42.7 06/13/2019 0820   HCT 44.8 05/05/2010 1049   PLT 230.0 06/13/2019 0820   PLT 193 05/05/2010 1049   MCV 91.6 06/13/2019 0820   MCV 89.6 05/05/2010 1049   MCH 30.4 05/05/2010 1049   MCHC 34.6 06/13/2019 0820   RDW 12.7 06/13/2019 0820   RDW 13.1 05/05/2010 1049   LYMPHSABS  2.1 06/13/2019 0820   LYMPHSABS 1.8 05/05/2010 1049   MONOABS 0.5 06/13/2019 0820   MONOABS 0.4 05/05/2010 1049   EOSABS 0.3 06/13/2019 0820   EOSABS 0.2 05/05/2010 1049   BASOSABS 0.0 06/13/2019 0820   BASOSABS 0.0 05/05/2010 1049    No results found for: POCLITH, LITHIUM   No results found for: PHENYTOIN, PHENOBARB, VALPROATE, CBMZ   .res Assessment: Plan:     Prashant was seen today for follow-up, major depressive disorder with single episode, in partial r and anxiety.  Diagnoses and all orders for this visit:  Major depressive disorder with single episode, in partial remission ([place])  GAD (generalized anxiety disorder)  Mixed obsessional thoughts and acts  Alcohol dependence in remission [facility])    Patient seen for routine appointment today.  At appointment June 14, 2019 patient was markedly more depressed and anxious somewhat unexpectedly.  He is remained sober.  It was felt that his alcohol was a major driving force for his depression and anxiety but he has remained sober and has had a worsening depression and anxiety. at the visit June 14, 2019 we had increase olanzapine back to 5 mg daily and also added lithium 600 mg daily both  to potentiate the antidepressant and because of suicidal thoughts.  His symptoms of depression have improved.  He continues to have anxiety symptoms that are most consistent with OCD.  He will obsess on relationship problems and then seek reassurance from others which briefly helps but then he starts obsessing again.  We discussed the value of increasing paroxetine because SSRIs tend to work better for obsessions at higher dose.  We tried increasing the paroxetine back to 60 mg but it provided no additional benefit nor side effects.  He wants to reduce it given the lack of benefit with the increase.  Continue CPAP use. He gets some benefit with it but machine is old and needs to be replaced. When he had better functioning CPAP machine it helped his  alertness, mood, productivity and concentration.   He does not believe the lithium really helped the depression very much and he wants to try to taper off of it.  I reminded him he did have some suicidal thoughts prior to starting lithium and that could recur.  Sometimes lithium will help with those thoughts even when it does not help depression.  He wants to try anyway.  He will let us know if he has any recurrence of suicidal thoughts and will restart lithium.  Discussed potential metabolic side effects associated with atypical antipsychotics, as well as potential risk for movement side effects. Advised pt to contact office if movement side effects occur. Disc weight gain risk with olanzapine. He agrees.  Call if you have any problems with this.  He remains sober.  He wants to consider counseling to work through some personal issues as well as address specific symptoms like his anger and irritability.  Reduce lithium by 1 tablet per week.  Call if there is any suicidal thought.  Starting April 1 reduce paroxetine to 40 mg daily. Continue low-dose Wellbutrin 75  Tolerating Wellbutrin and it seems to help. He didn't tolerate the increase.   If doing OK will try taper as he suggested at next visit. Continue olanzapine 5 mg nightly it was helpful for depression and anxiety.  FU 12 weeks  Lynder Parents, MD, DFAPA   Please see After Visit Summary for patient specific instructions.  Future Appointments  Date Time Provider [place]  06/03/2020  3:30 PM [facility] CCM PHARMACIST [facility]  06/16/2020  1:00 PM Nafziger, Tommi Rumps, NP [facility]    No orders of the defined types were placed in this encounter.     -------------------------------

## 2020-05-27 ENCOUNTER — Telehealth: Payer: Self-pay | Admitting: Psychiatry

## 2020-05-27 NOTE — Telephone Encounter (Signed)
Reviewed

## 2020-05-27 NOTE — Telephone Encounter (Signed)
Records for compliance with CPAP were sent to [facility].

## 2020-06-01 ENCOUNTER — Telehealth: Payer: Self-pay | Admitting: Pharmacist

## 2020-06-01 NOTE — Chronic Care Management (AMB) (Signed)
    Chronic Care Management Pharmacy Assistant   Name: TEJUAN GHOLSON  MRN: [id number] DOB: 11-07-1965  Reason for Encounter: Initial Questions for Pharmacist visit on 06-03-2020 Patient Questions:   1. Have you seen any other providers since your last visit? No 2. Any changes in your medications or health?  . Psychiatry medications  3. Any side effects from any medications? No 4. Do you have any symptoms or problems not managed by your medications? No 5. Any concerns about your health right now? No  6. Has your provider asked that you check blood pressure, blood sugar, or follow a special diet at home? No 7. Do you get any type of exercise regularly?  . Walking 8. Can you think of a goal you would like to reach for your health?  . Weight loss 9. Do you have any problems getting your medications? No 10. Is there anything that you would like to discuss during the appointment? No  The patient was asked to please bring medications, blood pressure/ blood sugar log, and supplements to his appointment.        Medications: Outpatient Encounter Medications as of 06/01/2020  Medication Sig  . amLODipine (NORVASC) 2.5 MG tablet TAKE 1 TABLET(2.5 MG) BY MOUTH DAILY  . atorvastatin (LIPITOR) 40 MG tablet TAKE 1 TABLET(40 MG) BY MOUTH DAILY  . buPROPion (WELLBUTRIN) 75 MG tablet Take 1 tablet (75 mg total) by mouth in the morning.  . Cyanocobalamin (VITAMIN B-12 PO) Take by mouth. (Patient not taking: Reported on 05/26/2020)  . lisinopril (ZESTRIL) 20 MG tablet TAKE 1 TABLET(20 MG) BY MOUTH DAILY  . OLANZapine (ZYPREXA) 5 MG tablet Take 1 tablet (5 mg total) by mouth at bedtime.  [name] PARoxetine (PAXIL) 30 MG tablet Take 2 tablets (60 mg total) by mouth daily.   No facility-administered encounter medications on file as of 06/01/2020.    Maia Breslow, [place] Assistant [phone]

## 2020-06-03 ENCOUNTER — Other Ambulatory Visit: Payer: Self-pay

## 2020-06-03 ENCOUNTER — Ambulatory Visit (INDEPENDENT_AMBULATORY_CARE_PROVIDER_SITE_OTHER): Payer: Medicare Other | Admitting: Pharmacist

## 2020-06-03 DIAGNOSIS — I10 Essential (primary) hypertension: Secondary | ICD-10-CM | POA: Diagnosis not present

## 2020-06-03 DIAGNOSIS — F411 Generalized anxiety disorder: Secondary | ICD-10-CM

## 2020-06-03 DIAGNOSIS — E782 Mixed hyperlipidemia: Secondary | ICD-10-CM

## 2020-06-03 DIAGNOSIS — F324 Major depressive disorder, single episode, in partial remission: Secondary | ICD-10-CM | POA: Diagnosis not present

## 2020-06-03 NOTE — Progress Notes (Signed)
Chronic Care Management Pharmacy Note  06/12/2020 Name:  Jonathon Walker MRN:  [id number] DOB:  December 05, 1965  Subjective: Jonathon Walker is an 55 y.o. year old male who is a primary patient of Nafziger, Tommi Rumps, NP.  The CCM team was consulted for assistance with disease management and care coordination needs.    Engaged with patient face to face for initial visit in response to provider referral for pharmacy case management and/or care coordination services.   Consent to Services:  The patient was given the following information about Chronic Care Management services today, agreed to services, and gave verbal consent: 1. CCM service includes personalized support from designated clinical staff supervised by the primary care provider, including individualized plan of care and coordination with other care providers 2. 24/7 contact phone numbers for assistance for urgent and routine care needs. 3. Service will only be billed when office clinical staff spend 20 minutes or more in a month to coordinate care. 4. Only one practitioner may furnish and bill the service in a calendar month. 5.The patient may stop CCM services at any time (effective at the end of the month) by phone call to the office staff. 6. The patient will be responsible for cost sharing (co-pay) of up to 20% of the service fee (after annual deductible is met). Patient agreed to services and consent obtained.  Patient Care Team: [name], NP as PCP - General (Family Medicine) Jettie Booze, MD as PCP - Cardiology (Cardiology) Cottle, Billey Co., MD as Attending Physician (Psychiatry) Jettie Booze, MD as Consulting Physician (Cardiology) Viona Gilmore, [facility] as Pharmacist (Pharmacist)  Recent office visits: 06/07/19 [name], NP: Patient presented for annual exam. Placed referral to pulmonology, GI, and cardiology. Tetanus vaccine administered.  Recent consult visits: 05/26/20 Lynder Parents, MD (behavioral  health):  Patient presented for depression follow up. D/c'd lithium as there was little benefit. Reduced dose of paroxetine to 30 mg as no more benefit with higher dose.  02/18/20 Lynder Parents, MD (behavioral health):  Patient presented for depression follow up. Increased Paroxetine to 60 mg daily.  12/24/19 Blanchie Serve, PhD (psychiatry group): Patient presented for depression follow up and CBT.  12/02/19 Diona Browner (oral surgery): Unable to access notes.  07/08/19 Larae Grooms, MD (cardiology): Patient presented for new evaluation of heart murmur. Plan for repeat Echo in 1 year and follow up.   Hospital visits: None in previous 6 months  Objective:  Lab Results  Component Value Date   CREATININE 1.14 06/13/2019   BUN 19 06/13/2019   GFR 66.87 06/13/2019   NA 139 06/13/2019   K 4.2 06/13/2019   CALCIUM 9.6 06/13/2019   CO2 28 06/13/2019    Lab Results  Component Value Date/Time   HGBA1C 5.7 06/13/2019 08:20 AM   HGBA1C 5.2 05/31/2018 11:00 AM   GFR 66.87 06/13/2019 08:20 AM   GFR 67.13 05/31/2018 11:00 AM    Last diabetic Eye exam: No results found for: HMDIABEYEEXA  Last diabetic Foot exam: No results found for: HMDIABFOOTEX   Lab Results  Component Value Date   CHOL 127 06/13/2019   HDL 34.70 (L) 06/13/2019   LDLCALC 73 06/13/2019   LDLDIRECT 204.0 05/31/2018   TRIG 94.0 06/13/2019   CHOLHDL 4 06/13/2019    Hepatic Function Latest Ref Rng & Units 06/13/2019 05/31/2018 05/05/2010  Total Protein 6.0 - 8.3 g/dL 7.0 7.6 6.7  Albumin 3.5 - 5.2 g/dL 4.4 4.6 4.4  AST 0 - 37 U/L  26 23 26   ALT 0 - 53 U/L 42 19 37  Alk Phosphatase 39 - 117 U/L 107 79 73  Total Bilirubin 0.2 - 1.2 mg/dL 0.4 0.6 0.4    Lab Results  Component Value Date/Time   TSH 2.06 06/13/2019 08:20 AM   TSH 1.80 05/31/2018 11:00 AM    CBC Latest Ref Rng & Units 06/13/2019 05/31/2018 05/05/2010  WBC 4.0 - 10.5 K/uL 5.5 5.2 4.7  Hemoglobin 13.0 - 17.0 g/dL 14.8 17.0 15.2  Hematocrit 39.0 - 52.0  % 42.7 48.4 44.8  Platelets 150.0 - 400.0 K/uL 230.0 236.0 193    No results found for: VD25OH  Clinical ASCVD: No  The ASCVD Risk score Mikey Bussing DC Jr., et al., 2013) failed to calculate for the following reasons:   The valid total cholesterol range is 130 to 320 mg/dL    No flowsheet data found.    Social History   Tobacco Use  Smoking Status Current Every Day Smoker  . Packs/day: 1.50  . Years: 0.00  . Pack years: 0.00  . Types: Cigarettes  Smokeless Tobacco Never Used   BP Readings from Last 3 Encounters:  08/30/19 110/62  07/08/19 130/72  06/07/19 128/66   Pulse Readings from Last 3 Encounters:  08/30/19 (!) 59  07/08/19 69  09/19/17 72   Wt Readings from Last 3 Encounters:  08/30/19 260 lb (117.9 kg)  08/16/19 260 lb (117.9 kg)  07/08/19 262 lb (118.8 kg)    Assessment/Interventions: Review of patient past medical history, allergies, medications, health status, including review of consultants reports, laboratory and other test data, was performed as part of comprehensive evaluation and provision of chronic care management services.   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 He lives by himself and doesn't have enough energy after coming back from work so he doesn't exercise.   Patient doesn't eat a lot of salt and tries to eat more whole wheat and whole grains lately. He drinks lower fat milk, eats spinach instead of lettuce, grills steak every once in a while and for lunch he eats a ham and [place] sandwich. He does not eat enough vegetables, maybe 2-3 times a week and is eating cookies every night. He is also drinking 4  diet sodas per day.  He does move around at work and loves to walk and used to do it every day but does not have enough energy to do it. The fatigue has been going on for about a year.  He uses a CPAP at night and sleeps ok but not all the way through the night. He doesn't feel rested when he wakes up but does not usually take naps. He also has nightmares that are probably stress related.   Patient believes his medications are working well but may be having some constipation related to them.   CCM Care Plan  No Known Allergies  Medications Reviewed Today    Reviewed by Purnell Shoemaker., MD (Psychiatrist) on 05/26/20 at 59  Med List Status: <None>  Medication Order Taking? Sig Documenting Provider Last Dose Status Informant  amLODipine (NORVASC) 2.5 MG tablet [id number] Yes TAKE 1 TABLET(2.5 MG) BY MOUTH DAILY Nafziger,  Tommi Rumps, NP Taking Active   atorvastatin (LIPITOR) 40 MG tablet [id number] Yes TAKE 1 TABLET(40 MG) BY MOUTH DAILY Nafziger, Tommi Rumps, NP Taking Active   buPROPion (WELLBUTRIN) 75 MG tablet [id number] Yes Take 1 tablet (75 mg total) by mouth in the morning. Cottle, Billey Co., MD Taking Active   Cyanocobalamin (VITAMIN B-12 PO) [id number] No Take by mouth.  Patient not taking: Reported on 05/26/2020   [provider] Not Taking Active   lisinopril (ZESTRIL) 20 MG tablet [id number] Yes TAKE 1 TABLET(20 MG) BY MOUTH DAILY Nafziger, Tommi Rumps, NP Taking Active   lithium carbonate 300 MG capsule [id number] Yes Take 3 capsules (900 mg total) by mouth at bedtime.  Patient taking differently: Take 600 mg by mouth at bedtime.   Cottle, Billey Co., MD Taking Active   OLANZapine [facility]) 5 MG tablet [id number] Yes Take 1 tablet (5 mg total) by mouth at bedtime. Cottle, Billey Co., MD Taking Active   PARoxetine (PAXIL) 30 MG tablet [id number] Yes Take 2 tablets (60 mg total) by mouth daily. Cottle, Billey Co., MD Taking Active           Patient Active Problem List   Diagnosis Date  Noted  . GAD (generalized anxiety disorder) 02/06/2018  . MDD (major depressive disorder) 02/06/2018  . OSA (obstructive sleep apnea) 11/13/2012    Immunization History  Administered Date(s) Administered  . PFIZER(Purple Top)SARS-COV-2 Vaccination 07/03/2019, 07/24/2019, 02/27/2020  . Tdap 06/07/2019    Conditions to be addressed/monitored:  Hypertension, Hyperlipidemia, Depression, Anxiety, Tobacco use and prediabetes  Care Plan : [place]  Updates made by Viona Gilmore, [place] since 06/12/2020 12:00 AM    Problem: Problem: Hypertension, Hyperlipidemia, Depression, Anxiety, Tobacco use and prediabetes     Long-Range Goal: Patient-Specific Goal   Start Date: 06/03/2020  Expected End Date: 06/03/2021  This Visit's Progress: On track  Priority: High  Note:   Current Barriers:  . Unable to independently monitor therapeutic efficacy  Pharmacist Clinical Goal(s):  [name] Patient will achieve adherence to monitoring guidelines and medication adherence to achieve therapeutic efficacy through collaboration with PharmD and provider.   Interventions: . 1:1 collaboration with [name], NP regarding development and update of comprehensive plan of care as evidenced by provider attestation and co-signature . Inter-disciplinary care team collaboration (see longitudinal plan of care) . Comprehensive medication review performed; medication list updated in electronic medical record  Hypertension (BP goal <130/80) -Controlled -Current treatment: . Amlodipine 2.5 mg 1 tablet daily . Lisinopril 20 mg 1 tablet daily -Medications previously tried: none  -Current home readings: does not check at home -Current dietary habits: tries to follow low sodium diet; does not eat out and eats prepackaged foods like pizza every couple of weeks -Current exercise habits: not exercising due to fatigue -Denies hypotensive/hypertensive symptoms -Educated on Daily salt intake goal < 2300  mg; Exercise goal of 150 minutes per week; Importance of home blood pressure monitoring; -Counseled to monitor BP at home weekly, document, and provide log at future appointments -Recommended to continue current medication  Hyperlipidemia: (LDL goal < 100) -Controlled -Current treatment: . Atorvastatin 40 mg 1 tablet daily -Medications previously tried: none  -Current dietary patterns: does not eat fried foods; tries to aim for whole grains -Current exercise habits: does not exercise -Educated on Cholesterol goals;  Benefits of statin for ASCVD risk reduction; Importance of limiting foods high in cholesterol; Exercise goal of 150 minutes per week; -Counseled on diet and exercise extensively Recommended  to continue current medication  Pre-diabetes (A1c goal <6.5%) -Controlled -Current medications: . No medications -Medications previously tried: none  -Current home glucose readings . fasting glucose: n/a . post prandial glucose: n/a -Denies hypoglycemic/hyperglycemic symptoms -Current meal patterns: eats more carbs than he should -Current exercise: does not exercise -Educated on A1c and blood sugar goals; Exercise goal of 150 minutes per week; Carbohydrate counting and/or plate method -Counseled to check feet daily and get yearly eye exams -Counseled on diet and exercise extensively Recommended to continue current medication  Depression/Anxiety (Goal: minimize symptoms) -Uncontrolled -Current treatment: . Paroxetine 60 mg 1 tablet daily  . Bupropion 75 mg 1 tablet every morning . Olanzapine 5 mg 1 tablet at bedtime -Medications previously tried/failed: paxil 60 "too much",  Buspar stopped DT little effect. Xanax, duloxetine, Wellbutrin 75 (max tolerated), Zoloft, Viibryd, Abilify, Rexulti, Olanzapine, lithium -PHQ9: n/a -GAD7: n/a -Educated on Benefits of medication for symptom control Benefits of cognitive-behavioral therapy with or without medication -Recommended to  continue current medication  Tobacco use (Goal quit smoking) -Uncontrolled -Previous quit attempts: cold [place] -Current treatment  . No medications -Patient smokes Within 30 minutes of waking -Patient triggers include:  habit -On a scale of 1-10, reports MOTIVATION to quit is 0 -On a scale of 1-10, reports CONFIDENCE in quitting is 1 -Provided contact information for [employer] [facility] (1-800-QUIT-NOW) and encouraged patient to reach out to this group for support. -Counseled on finding reasons to quit smoking  Health Maintenance -Vaccine gaps: pneumovax (smoking), shingles -Current therapy:  [name] Vitamin B12 1000 mcg 1 tablet daily -Educated on Cost vs benefit of each product must be carefully weighed by individual consumer -Patient is satisfied with current therapy and denies issues -Recommended to continue current medication  Patient Goals/Self-Care Activities . Patient will:  - take medications as prescribed check blood pressure weekly, document, and provide at future appointments target a minimum of 150 minutes of moderate intensity exercise weekly engage in dietary modifications by adding more vegetables to his diet  Follow Up Plan: Telephone follow up appointment with care management team member scheduled for: 6 months      Medication Assistance: None required.  Patient affirms current coverage meets needs.  Patient's preferred pharmacy is:  [facility] [place], [address] AT [address] [place] [address] Phone: [phone] Fax: [phone]  Uses pill box? No - knows what to take Pt endorses 99% compliance - missed today, rare, 2 at night and 4 in the morning  We discussed: Current pharmacy is preferred with insurance plan and patient is satisfied with pharmacy services Patient decided to: Continue current medication management strategy  Care Plan and Follow Up Patient Decision:  Patient  agrees to Care Plan and Follow-up.  Plan: Telephone follow up appointment with care management team member scheduled for:  6 months  [name], PharmD [place] Pharmacist [place] [facility] [place] [phone]

## 2020-06-12 ENCOUNTER — Other Ambulatory Visit: Payer: Self-pay | Admitting: Adult Health

## 2020-06-12 DIAGNOSIS — E782 Mixed hyperlipidemia: Secondary | ICD-10-CM

## 2020-06-12 DIAGNOSIS — I10 Essential (primary) hypertension: Secondary | ICD-10-CM

## 2020-06-12 NOTE — Patient Instructions (Addendum)
Hi Jonathon Walker,  It was great to get to meet you in person! Below is a summary of some of the topics we discussed. As we discussed, I think it's very important for you to check your blood pressure at home and will have my assistant reach out to you in a couple of months to see how that is looking.  Please reach out to me if you have any questions or need anything before our follow up!  Best, Jonathon Walker  Jonathon Walker, PharmD, [facility]  Visit Information  Goals Addressed   None    Patient Care Plan: CCM Pharmacy Care Plan    Problem Identified: Problem: Hypertension, Hyperlipidemia, Depression, Anxiety, Tobacco use and prediabetes     Long-Range Goal: Patient-Specific Goal   Start Date: 06/03/2020  Expected End Date: 06/03/2021  This Visit's Progress: On track  Priority: High  Note:   Current Barriers:  . Unable to independently monitor therapeutic efficacy  Pharmacist Clinical Goal(s):  [name] Patient will achieve adherence to monitoring guidelines and medication adherence to achieve therapeutic efficacy through collaboration with PharmD and provider.   Interventions: . 1:1 collaboration with Jonathon Peng, NP regarding development and update of comprehensive plan of care as evidenced by provider attestation and co-signature . Inter-disciplinary care team collaboration (see longitudinal plan of care) . Comprehensive medication review performed; medication list updated in electronic medical record  Hypertension (BP goal <130/80) -Controlled -Current treatment: . Amlodipine 2.5 mg 1 tablet daily . Lisinopril 20 mg 1 tablet daily -Medications previously tried: none  -Current home readings: does not check at home -Current dietary habits: tries to follow low sodium diet; does not eat out and eats prepackaged foods like pizza every couple of weeks -Current exercise habits: not exercising due to fatigue -Denies  hypotensive/hypertensive symptoms -Educated on Daily salt intake goal < 2300 mg; Exercise goal of 150 minutes per week; Importance of home blood pressure monitoring; -Counseled to monitor BP at home weekly, document, and provide log at future appointments -Recommended to continue current medication  Hyperlipidemia: (LDL goal < 100) -Controlled -Current treatment: . Atorvastatin 40 mg 1 tablet daily -Medications previously tried: none  -Current dietary patterns: does not eat fried foods; tries to aim for whole grains -Current exercise habits: does not exercise -Educated on Cholesterol goals;  Benefits of statin for ASCVD risk reduction; Importance of limiting foods high in cholesterol; Exercise goal of 150 minutes per week; -Counseled on diet and exercise extensively Recommended to continue current medication  Pre-diabetes (A1c goal <6.5%) -Controlled -Current medications: . No medications -Medications previously tried: none  -Current home glucose readings . fasting glucose: n/a . post prandial glucose: n/a -Denies hypoglycemic/hyperglycemic symptoms -Current meal patterns: eats more carbs than he should -Current exercise: does not exercise -Educated on A1c and blood sugar goals; Exercise goal of 150 minutes per week; Carbohydrate counting and/or plate method -Counseled to check feet daily and get yearly eye exams -Counseled on diet and exercise extensively Recommended to continue current medication  Depression/Anxiety (Goal: minimize symptoms) -Uncontrolled -Current treatment: . Paroxetine 60 mg 1 tablet daily  . Bupropion 75 mg 1 tablet every morning . Olanzapine 5 mg 1 tablet at bedtime -Medications previously tried/failed: paxil 60 "too much",  Buspar stopped DT little effect. Xanax, duloxetine, Wellbutrin 75 (max tolerated), Zoloft, Viibryd, Abilify, Rexulti, Olanzapine, lithium -PHQ9: n/a -GAD7: n/a -Educated on Benefits of medication for symptom control Benefits  of cognitive-behavioral therapy with or without medication -Recommended to continue current medication  Tobacco use (Goal quit smoking) -Uncontrolled -Previous quit attempts: cold [place] -Current treatment  . No medications -Patient smokes Within 30 minutes of waking -Patient triggers include:  habit -On a scale of 1-10, reports MOTIVATION to quit is 0 -On a scale of 1-10, reports CONFIDENCE in quitting is 1 -Provided contact information for [place] Quit Line (1-800-QUIT-NOW) and encouraged patient to reach out to this group for support. -Counseled on finding reasons to quit smoking  Health Maintenance -Vaccine gaps: pneumovax (smoking), shingles -Current therapy:  [name] Vitamin B12 1000 mcg 1 tablet daily -Educated on Cost vs benefit of each product must be carefully weighed by individual consumer -Patient is satisfied with current therapy and denies issues -Recommended to continue current medication  Patient Goals/Self-Care Activities . Patient will:  - take medications as prescribed check blood pressure weekly, document, and provide at future appointments target a minimum of 150 minutes of moderate intensity exercise weekly engage in dietary modifications by adding more vegetables to his diet  Follow Up Plan: Telephone follow up appointment with care management team member scheduled for: 6 months      Jonathon Walker was given information about Chronic Care Management services today including:  1. CCM service includes personalized support from designated clinical staff supervised by his physician, including individualized plan of care and coordination with other care providers 2. 24/7 contact phone numbers for assistance for urgent and routine care needs. 3. Standard insurance, coinsurance, copays and deductibles apply for chronic care management only during months in which we provide at least 20 minutes of these services. Most insurances cover these services at 100%, however patients may  be responsible for any copay, coinsurance and/or deductible if applicable. This service may help you avoid the need for more expensive face-to-face services. 4. Only one practitioner may furnish and bill the service in a calendar month. 5. The patient may stop CCM services at any time (effective at the end of the month) by phone call to the office staff.  Patient agreed to services and verbal consent obtained.   The patient verbalized understanding of instructions, educational materials, and care plan provided today and agreed to receive a mailed copy of patient instructions, educational materials, and care plan.  Telephone follow up appointment with pharmacy team member scheduled for: 6 months  Viona Gilmore, [facility]  How to Take Your Blood Pressure Blood pressure measures how strongly your blood is pressing against the walls of your arteries. Arteries are blood vessels that carry blood from your heart throughout your body. You can take your blood pressure at home with a machine. You may need to check your blood pressure at home:  To check if you have high blood pressure (hypertension).  To check your blood pressure over time.  To make sure your blood pressure medicine is working. Supplies needed:  Blood pressure machine, or monitor.  Dining room chair to sit in.  Table or desk.  Small notebook.  Pencil or pen. How to prepare Avoid these things for 30 minutes before checking your blood pressure:  Having drinks with caffeine in them, such as coffee or tea.  Drinking alcohol.  Eating.  Smoking.  Exercising. Do these things five minutes before checking your blood pressure:  Go to the bathroom and pee (urinate).  Sit in a dining chair. Do not sit in a soft couch or an armchair.  Be quiet. Do not talk. How to take your blood pressure Follow the instructions that came with your machine. If you have  a digital blood pressure monitor, these may be the instructions: 1. Sit  up straight. 2. Place your feet on the floor. Do not cross your ankles or legs. 3. Rest your left arm at the level of your heart. You may rest it on a table, desk, or chair. 4. Pull up your shirt sleeve. 5. Wrap the blood pressure cuff around the upper part of your left arm. The cuff should be 1 inch (2.5 cm) above your elbow. It is best to wrap the cuff around bare skin. 6. Fit the cuff snugly around your arm. You should be able to place only one finger between the cuff and your arm. 7. Place the cord so that it rests in the bend of your elbow. 8. Press the power button. 9. Sit quietly while the cuff fills with air and loses air. 10. Write down the numbers on the screen. 11. Wait 2-3 minutes and then repeat steps 1-10.   What do the numbers mean? Two numbers make up your blood pressure. The first number is called systolic pressure. The second is called diastolic pressure. An example of a blood pressure reading is "120 over 80" (or 120/80). If you are an adult and do not have a medical condition, use this guide to find out if your blood pressure is normal: Normal  First number: below 120.  Second number: below 80. Elevated  First number: 120-129.  Second number: below 80. Hypertension stage 1  First number: 130-139.  Second number: 80-89. Hypertension stage 2  First number: 140 or above.  Second number: 43 or above. Your blood pressure is above normal even if only the top or bottom number is above normal. Follow these instructions at home:  Check your blood pressure as often as your doctor tells you to.  Check your blood pressure at the same time every day.  Take your monitor to your next doctor's appointment. Your doctor will: ? Make sure you are using it correctly. ? Make sure it is working right.  Make sure you understand what your blood pressure numbers should be.  Tell your doctor if your medicine is causing side effects.  Keep all follow-up visits as told by  your doctor. This is important. General tips:  You will need a blood pressure machine, or monitor. Your doctor can suggest a monitor. You can buy one at a drugstore or online. When choosing one: ? Choose one with an arm cuff. ? Choose one that wraps around your upper arm. Only one finger should fit between your arm and the cuff. ? Do not choose one that measures your blood pressure from your wrist or finger. Where to find more information American Heart Association: www.heart.org Contact a doctor if:  Your blood pressure keeps being high. Get help right away if:  Your first blood pressure number is higher than 180.  Your second blood pressure number is higher than 120. Summary  Check your blood pressure at the same time every day.  Avoid caffeine, alcohol, smoking, and exercise for 30 minutes before checking your blood pressure.  Make sure you understand what your blood pressure numbers should be. This information is not intended to replace advice given to you by your health care provider. Make sure you discuss any questions you have with your health care provider. Document Revised: 03/01/2019 Document Reviewed: 03/01/2019 Elsevier Patient Education  2021 [employer].

## 2020-06-15 ENCOUNTER — Other Ambulatory Visit: Payer: Self-pay

## 2020-06-16 ENCOUNTER — Ambulatory Visit (INDEPENDENT_AMBULATORY_CARE_PROVIDER_SITE_OTHER): Payer: Medicare Other | Admitting: Adult Health

## 2020-06-16 ENCOUNTER — Encounter: Payer: Self-pay | Admitting: Adult Health

## 2020-06-16 VITALS — BP 140/90 | HR 76 | Temp 98.0°F | Ht 75.25 in | Wt 273.4 lb

## 2020-06-16 DIAGNOSIS — Z23 Encounter for immunization: Secondary | ICD-10-CM | POA: Diagnosis not present

## 2020-06-16 DIAGNOSIS — E538 Deficiency of other specified B group vitamins: Secondary | ICD-10-CM

## 2020-06-16 DIAGNOSIS — Z720 Tobacco use: Secondary | ICD-10-CM

## 2020-06-16 DIAGNOSIS — Z1159 Encounter for screening for other viral diseases: Secondary | ICD-10-CM

## 2020-06-16 DIAGNOSIS — G4733 Obstructive sleep apnea (adult) (pediatric): Secondary | ICD-10-CM | POA: Diagnosis not present

## 2020-06-16 DIAGNOSIS — E782 Mixed hyperlipidemia: Secondary | ICD-10-CM

## 2020-06-16 DIAGNOSIS — Z0000 Encounter for general adult medical examination without abnormal findings: Secondary | ICD-10-CM | POA: Diagnosis not present

## 2020-06-16 DIAGNOSIS — Z125 Encounter for screening for malignant neoplasm of prostate: Secondary | ICD-10-CM | POA: Diagnosis not present

## 2020-06-16 DIAGNOSIS — F324 Major depressive disorder, single episode, in partial remission: Secondary | ICD-10-CM | POA: Diagnosis not present

## 2020-06-16 DIAGNOSIS — F411 Generalized anxiety disorder: Secondary | ICD-10-CM

## 2020-06-16 DIAGNOSIS — R5383 Other fatigue: Secondary | ICD-10-CM | POA: Diagnosis not present

## 2020-06-16 DIAGNOSIS — I10 Essential (primary) hypertension: Secondary | ICD-10-CM | POA: Diagnosis not present

## 2020-06-16 LAB — CBC WITH DIFFERENTIAL/PLATELET
Basophils Absolute: 0 10*3/uL (ref 0.0–0.1)
Basophils Relative: 0.5 % (ref 0.0–3.0)
Eosinophils Absolute: 0.3 10*3/uL (ref 0.0–0.7)
Eosinophils Relative: 3.5 % (ref 0.0–5.0)
HCT: 45.2 % (ref 39.0–52.0)
Hemoglobin: 15.7 g/dL (ref 13.0–17.0)
Lymphocytes Relative: 30 % (ref 12.0–46.0)
Lymphs Abs: 3 10*3/uL (ref 0.7–4.0)
MCHC: 34.7 g/dL (ref 30.0–36.0)
MCV: 89.1 fl (ref 78.0–100.0)
Monocytes Absolute: 0.7 10*3/uL (ref 0.1–1.0)
Monocytes Relative: 7.1 % (ref 3.0–12.0)
Neutro Abs: 5.8 10*3/uL (ref 1.4–7.7)
Neutrophils Relative %: 58.9 % (ref 43.0–77.0)
Platelets: 252 10*3/uL (ref 150.0–400.0)
RBC: 5.08 Mil/uL (ref 4.22–5.81)
RDW: 13 % (ref 11.5–15.5)
WBC: 9.9 10*3/uL (ref 4.0–10.5)

## 2020-06-16 LAB — LIPID PANEL
Cholesterol: 145 mg/dL (ref 0–200)
HDL: 33.5 mg/dL — ABNORMAL LOW (ref >39.00)
LDL Cholesterol: 84 mg/dL (ref 0–99)
NonHDL: 111.83
Total CHOL/HDL Ratio: 4
Triglycerides: 138 mg/dL (ref 0.0–149.0)
VLDL: 27.6 mg/dL (ref 0.0–40.0)

## 2020-06-16 LAB — IBC + FERRITIN
Ferritin: 246.1 ng/mL (ref 22.0–322.0)
Iron: 156 ug/dL (ref 42–165)
Saturation Ratios: 37.8 % (ref 20.0–50.0)
Transferrin: 295 mg/dL (ref 212.0–360.0)

## 2020-06-16 LAB — PSA: PSA: 0.5 ng/mL (ref 0.10–4.00)

## 2020-06-16 LAB — COMPREHENSIVE METABOLIC PANEL
ALT: 34 U/L (ref 0–53)
AST: 22 U/L (ref 0–37)
Albumin: 4.7 g/dL (ref 3.5–5.2)
Alkaline Phosphatase: 119 U/L — ABNORMAL HIGH (ref 39–117)
BUN: 16 mg/dL (ref 6–23)
CO2: 28 mEq/L (ref 19–32)
Calcium: 9.9 mg/dL (ref 8.4–10.5)
Chloride: 100 mEq/L (ref 96–112)
Creatinine, Ser: 1.2 mg/dL (ref 0.40–1.50)
GFR: 68.19 mL/min (ref >60.00)
Glucose, Bld: 99 mg/dL (ref 70–99)
Potassium: 4.4 mEq/L (ref 3.5–5.1)
Sodium: 136 mEq/L (ref 135–145)
Total Bilirubin: 0.6 mg/dL (ref 0.2–1.2)
Total Protein: 7.5 g/dL (ref 6.0–8.3)

## 2020-06-16 LAB — VITAMIN B12: Vitamin B-12: 245 pg/mL (ref 211–911)

## 2020-06-16 LAB — TSH: TSH: 1.63 u[IU]/mL (ref 0.35–4.50)

## 2020-06-16 LAB — HEMOGLOBIN A1C: Hgb A1c MFr Bld: 5.8 % (ref 4.6–6.5)

## 2020-06-16 LAB — VITAMIN D 25 HYDROXY (VIT D DEFICIENCY, FRACTURES): VITD: 23.46 ng/mL — ABNORMAL LOW (ref 30.00–100.00)

## 2020-06-16 MED ORDER — AMLODIPINE BESYLATE 5 MG PO TABS
ORAL_TABLET | ORAL | 3 refills | Status: DC
Start: 2020-06-16 — End: 2021-09-10

## 2020-06-16 NOTE — Patient Instructions (Signed)
It was great seeing you today   I will follow up with you once we get your blood work back   I have increase Norvasc from 2.5 mg to 5 mg daily to help bring down your blood pressure  Someone will contact you to schedule your CT scan for lung cancer screening

## 2020-06-16 NOTE — Progress Notes (Signed)
Subjective:    Patient ID: Jonathon Walker, male    DOB: 03/20/1966, 55 y.o.   MRN: [id number]  HPI Patient presents for yearly preventative medicine examination. He is a pleasant 55 year old male who  has a past medical history of Anxiety, Bimalleolar fracture of left ankle, Depression, Heart murmur, and Sleep apnea.  Anxiety and Depression -managed by psychiatry.  Currently prescribed Paxil 60 mg daily, Zyprexa 5 mg nightly, and Wellbutrin 75 mg in the morning. He reports that he was having " bad thoughts" and went back on Lithium 600 mg daily. Does report improvement since starting lithium.   Alcohol Abuse -is been sober over a year.  Anxiety improved with alcohol cessation. Going to AA.   Tobacco Use - Continues to smoke. He understands he needs to quit smoking.   Sleep Apnea -uses CPAP nightly.  Does feel as though he gets a full night sleep, usually around 8 or 9 hours.  Has intermittent episodes of chronic fatigue   Essential Hypertension -takes Norvasc 2.5 mg daily and lisinopril 20 mg.  Does not monitor his blood pressure at home.  Denies dizziness, lightheadedness, chest pain, shortness of breath, or syncopal episodes  BP Readings from Last 3 Encounters:  06/16/20 140/90  08/30/19 110/62  07/08/19 130/72   Hyperlipidemia - takes Lipitor 40 mg. Denies myalgia or fatigue  Lab Results  Component Value Date   CHOL 127 06/13/2019   HDL 34.70 (L) 06/13/2019   LDLCALC 73 06/13/2019   LDLDIRECT 204.0 05/31/2018   TRIG 94.0 06/13/2019   CHOLHDL 4 06/13/2019   Fatigue -feels tired all the time but goes in waves.   Was on B12 at some point in time but stopped taking this for unknown reasons.  Likely was taking this when he was drinking heavily. Does not feel sleep apnea is the cause of his fatigue   All immunizations and health maintenance protocols were reviewed with the patient and needed orders were placed.  Appropriate screening laboratory values were ordered for the patient  including screening of hyperlipidemia, renal function and hepatic function. If indicated by BPH, a PSA was ordered.  Medication reconciliation,  past medical history, social history, problem list and allergies were reviewed in detail with the patient  Goals were established with regard to weight loss, exercise, and  diet in compliance with medications. Has been sedentary d/t fatigue    Review of Systems  Constitutional: Positive for fatigue.  Respiratory: Negative.   Cardiovascular: Negative.   Gastrointestinal: Negative.   Genitourinary: Negative.   Musculoskeletal: Negative.   Skin: Negative.   Neurological: Negative.   Hematological: Negative.   Psychiatric/Behavioral: Positive for decreased concentration and sleep disturbance. The patient is nervous/anxious.     Past Medical History:  Diagnosis Date  . Anxiety   . Bimalleolar fracture of left ankle   . Depression   . Heart murmur   . Sleep apnea    uses CPAP nightly    Social History   Socioeconomic History  . Marital status: Single    Spouse name: Not on file  . Number of children: Not on file  . Years of education: Not on file  . Highest education level: Not on file  Occupational History  . Not on file  Tobacco Use  . Smoking status: Current Every Day Smoker    Packs/day: 1.50    Years: 0.00    Pack years: 0.00    Types: Cigarettes  . Smokeless tobacco: Never Used  Vaping Use  . Vaping Use: Never used  Substance and Sexual Activity  . Alcohol use: Not Currently  . Drug use: No  . Sexual activity: Not on file  Other Topics Concern  . Not on file  Social History Narrative  . Not on file   Social Determinants of Health   Financial Resource Strain: Low Risk   . Difficulty of Paying Living Expenses: Not very hard  Food Insecurity: Not on file  Transportation Needs: No Transportation Needs  . Lack of Transportation (Medical): No  . Lack of Transportation (Non-Medical): No  Physical Activity: Not on  file  Stress: Not on file  Social Connections: Not on file  Intimate Partner Violence: Not on file    Past Surgical History:  Procedure Laterality Date  . ORIF ANKLE FRACTURE Left 09/19/2017   Procedure: OPEN REDUCTION INTERNAL FIXATION (ORIF) ANKLE FRACTURE;  Surgeon: Renette Butters, MD;  Location: [facility];  Service: Orthopedics;  Laterality: Left;  . TOOTH EXTRACTION      Family History  Problem Relation Age of Onset  . Cancer Maternal Grandfather   . Colon cancer Neg Hx   . Colon polyps Neg Hx   . Esophageal cancer Neg Hx   . Rectal cancer Neg Hx   . Stomach cancer Neg Hx     No Known Allergies  Current Outpatient Medications on File Prior to Visit  Medication Sig Dispense Refill  . amLODipine (NORVASC) 2.5 MG tablet TAKE 1 TABLET(2.5 MG) BY MOUTH DAILY 90 tablet 3  . atorvastatin (LIPITOR) 40 MG tablet TAKE 1 TABLET(40 MG) BY MOUTH DAILY 90 tablet 3  . buPROPion (WELLBUTRIN) 75 MG tablet Take 1 tablet (75 mg total) by mouth in the morning. 90 tablet 1  . Cyanocobalamin (VITAMIN B-12 PO) Take by mouth.    [name] lisinopril (ZESTRIL) 20 MG tablet TAKE 1 TABLET(20 MG) BY MOUTH DAILY 90 tablet 0  . lithium carbonate 300 MG capsule Take 300 mg by mouth 3 (three) times daily with meals.    [name] OLANZapine (ZYPREXA) 5 MG tablet Take 1 tablet (5 mg total) by mouth at bedtime. 90 tablet 1  . PARoxetine (PAXIL) 30 MG tablet Take 2 tablets (60 mg total) by mouth daily. 180 tablet 1   No current facility-administered medications on file prior to visit.    BP 140/90 (BP Location: Left Arm, Patient Position: Sitting, Cuff Size: Normal)   Pulse 76   Temp 98 F (36.7 C) (Oral)   Ht 6' 3.25" (1.911 m)   Wt 273 lb 6.4 oz (124 kg)   SpO2 97%   BMI 33.95 kg/m       Objective:   Physical Exam Vitals and nursing note reviewed.  Constitutional:      General: He is not in acute distress.    Appearance: Normal appearance. He is well-developed. He is obese.  HENT:      Head: Normocephalic and atraumatic.     Right Ear: Tympanic membrane, ear canal and external ear normal. There is no impacted cerumen.     Left Ear: Tympanic membrane, ear canal and external ear normal. There is no impacted cerumen.     Nose: Nose normal. No congestion or rhinorrhea.     Mouth/Throat:     Mouth: Mucous membranes are moist.     Pharynx: Oropharynx is clear. No oropharyngeal exudate or posterior oropharyngeal erythema.  Eyes:     General:        Right eye: No  discharge.        Left eye: No discharge.     Extraocular Movements: Extraocular movements intact.     Conjunctiva/sclera: Conjunctivae normal.     Pupils: Pupils are equal, round, and reactive to light.  Neck:     Vascular: No carotid bruit.     Trachea: No tracheal deviation.  Cardiovascular:     Rate and Rhythm: Normal rate and regular rhythm.     Pulses: Normal pulses.     Heart sounds: Murmur heard.  No friction rub. No gallop.   Pulmonary:     Effort: Pulmonary effort is normal. No respiratory distress.     Breath sounds: Normal breath sounds. No stridor. No wheezing, rhonchi or rales.  Chest:     Chest wall: No tenderness.  Abdominal:     General: Bowel sounds are normal. There is no distension.     Palpations: Abdomen is soft. There is no mass.     Tenderness: There is no abdominal tenderness. There is no right CVA tenderness, left CVA tenderness, guarding or rebound.     Hernia: No hernia is present.  Musculoskeletal:        General: No swelling, tenderness, deformity or signs of injury. Normal range of motion.     Right lower leg: No edema.     Left lower leg: No edema.  Lymphadenopathy:     Cervical: No cervical adenopathy.  Skin:    General: Skin is warm and dry.     Capillary Refill: Capillary refill takes less than 2 seconds.     Coloration: Skin is not jaundiced or pale.     Findings: No bruising, erythema, lesion or rash.  Neurological:     General: No focal deficit present.     Mental  Status: He is alert and oriented to person, place, and time.     Cranial Nerves: No cranial nerve deficit.     Sensory: No sensory deficit.     Motor: No weakness.     Coordination: Coordination normal.     Gait: Gait normal.     Deep Tendon Reflexes: Reflexes normal.  Psychiatric:        Mood and Affect: Mood normal.        Behavior: Behavior normal.        Thought Content: Thought content normal.        Judgment: Judgment normal.       Assessment & Plan:  1. Routine general medical examination at a health care facility - Needs to stop smoking and start exercising more often  - Follow up in one year or sooner if needed - CBC with Differential/Platelet; Future - Comprehensive metabolic panel; Future - Hemoglobin A1c; Future - Lipid panel; Future - TSH; Future - Vitamin D, 25-hydroxy; Future - CBC with Differential/Platelet - Comprehensive metabolic panel - Hemoglobin A1c - Lipid panel - TSH - Vitamin D, 25-hydroxy  2. Essential hypertension - Slightly elevated today. Will increase norvasc to 5 mg  - CBC with Differential/Platelet; Future - Comprehensive metabolic panel; Future - Hemoglobin A1c; Future - Lipid panel; Future - TSH; Future - Vitamin D, 25-hydroxy; Future - amLODipine (NORVASC) 5 MG tablet; Take daily  Dispense: 90 tablet; Refill: 3 - CBC with Differential/Platelet - Comprehensive metabolic panel - Hemoglobin A1c - Lipid panel - TSH - Vitamin D, 25-hydroxy  3. Mixed hyperlipidemia - Consider increase in statin  - CBC with Differential/Platelet; Future - Comprehensive metabolic panel; Future - Hemoglobin A1c; Future - Lipid panel;  Future - TSH; Future - Vitamin D, 25-hydroxy; Future - CBC with Differential/Platelet - Comprehensive metabolic panel - Hemoglobin A1c - Lipid panel - TSH - Vitamin D, 25-hydroxy  4. GAD (generalized anxiety disorder) - Follow up with psychiatry as directed  5. Major depressive disorder with single episode, in  partial remission ([place]) - Follow up with psychiatry as directed  6. OSA (obstructive sleep apnea) - Continue with CPAP   7. Prostate cancer screening  - PSA; Future - PSA  8. Other fatigue - Likely multifactorial from psychotropic medications, diet, and sedentary lifestyle  - CBC with Differential/Platelet; Future - Comprehensive metabolic panel; Future - Hemoglobin A1c; Future - Lipid panel; Future - TSH; Future - Vitamin D, 25-hydroxy; Future - Vitamin B12; Future - IBC + Ferritin; Future - CBC with Differential/Platelet - Comprehensive metabolic panel - Hemoglobin A1c - Lipid panel - TSH - Vitamin D, 25-hydroxy - Vitamin B12 - IBC + Ferritin  9. B12 deficiency - Consider adding back B12 supplement  - Vitamin B12; Future - Vitamin B12  10. Tobacco use - needs to quit smoking - CT CHEST LUNG CA SCREEN LOW DOSE W/O CM; Future  11. Need for hepatitis C screening test  - Hep C Antibody; Future - Hep C Antibody  12. Need for pneumococcal vaccine  - Pneumococcal conjugate vaccine 20-valent (Prevnar-20)  [name]

## 2020-06-17 LAB — HEPATITIS C ANTIBODY
Hepatitis C Ab: NONREACTIVE
SIGNAL TO CUT-OFF: 0.01 (ref <1.00)

## 2020-06-22 DIAGNOSIS — I10 Essential (primary) hypertension: Secondary | ICD-10-CM | POA: Diagnosis not present

## 2020-06-22 DIAGNOSIS — G4733 Obstructive sleep apnea (adult) (pediatric): Secondary | ICD-10-CM | POA: Diagnosis not present

## 2020-07-01 ENCOUNTER — Encounter: Payer: Self-pay | Admitting: Psychiatry

## 2020-07-01 ENCOUNTER — Ambulatory Visit (INDEPENDENT_AMBULATORY_CARE_PROVIDER_SITE_OTHER): Payer: Medicare Other | Admitting: Psychiatry

## 2020-07-01 ENCOUNTER — Other Ambulatory Visit: Payer: Self-pay

## 2020-07-01 DIAGNOSIS — F324 Major depressive disorder, single episode, in partial remission: Secondary | ICD-10-CM

## 2020-07-01 MED ORDER — LITHIUM CARBONATE 300 MG PO CAPS: 900.0000 mg | ORAL_CAPSULE | Freq: Every day | ORAL | 0 refills | Status: DC

## 2020-07-01 MED ORDER — LAMOTRIGINE 25 MG PO TABS: ORAL_TABLET | ORAL | 0 refills | Status: DC

## 2020-07-01 NOTE — Progress Notes (Signed)
Jonathon Walker [id number] November 03, 1965 55 y.o.  Subjective:   Patient ID:  Jonathon Walker is a 55 y.o. (DOB 08/12/1965) male.  Chief Complaint:  Chief Complaint  Patient presents with  . Follow-up  . Depression  . Anxiety    Depression        Associated symptoms include no decreased concentration and no suicidal ideas.  Past medical history includes anxiety.   Anxiety Symptoms include nervous/anxious behavior. Patient reports no confusion, decreased concentration, palpitations or suicidal ideas.       Jonathon Walker presents to the office today for follow-up of anxiety , depression, alcohol.    seen August 7.  His anxiety was unmanaged at Paxil 40 mg and olanzapine 5 mg so olanzapine was increased to 7.5 daily.  seen December 17, 2018.  His anxiety was unmanaged.  The following change was made: Trial 1-1/2 of the 7.5 mg tablets of olanzapine for anxiety.  He is had a stay at [facility] for alcohol dependence since he was last here. Stayed 28 days and DC before Thanksgiving.  Very helpful.  Was having NM nighly before and they stopped since then.  Not drinking.  Involved in AA since then.  seen March 25, 2019.  The patient was doing better requested a reduction in olanzapine to 7.5 mg nightly to improve energy.  visit April 03, 2019.  He had remained sober and his anxiety was improved with sobriety.  He was having problems with low motivation and forgetfulness.  At the next refill it was decided olanzapine would be reduced from 7.5 to 5 mg daily. He called requesting this urgent appointment because of worsening symptoms associated with returning to work.  seen May 21, 2019.  He was doing relatively well.  The following was noted: Tolerating Wellbutrin and it seems to help. He didn't tolerate the increase. Anxiety is improved off alcohol.  Ok to reduce olanzapine to 2.5 mg daily for a month and stop it.  Hopefully low motivation will improve.   He called back March 23  stating he was more depressed and having a harder time doing routine tasks and having suicidal thoughts.  Because of worsening depression after reducing the olanzapine he was encouraged to increase olanzapine back to 5 mg nightly. He called again March 24 stating he was really struggling but was scheduled for an appointment on March 26.  As of June 14, 2019 he reports the following: Every day a real hard struggle to get through the days.  More anxious and depressed equally. Worse 2 weeks.  Last Saturday so overwhelmed by how he feels he had SI.  Everything is hard. Initial benefit paroxetine has been lost.  Awakens stressed and tired. Adequate hours of sleep.  Will be major chore just to mow grass.  Still sober. SE sexual. Getting appt with CPAP doc. Changes made include: Increasing olanzapine back to 5 mg daily a couple of days prior to the appointment due to phone call and the addition of lithium 300 mg for a few days then increase to 600 mg nightly both to augment the antidepressant and because of suicidal thoughts.  July 15, 2019 appointment, the following is noted: Doing OK with both depression and anxiety.  Kind of like a top always going in mind.  Even when I sleep, when awakens mind still going.  NM nightly for years but less than before stopped drinking.  Smaller and less severe ones that don't wake him.  Theme can't finish things.  Both depression and anxiety 3/10.  More productive.  Paces himself.  Can live with it.  Sleep 9 hours.  Initially olanzapine stopped the ruminating but it's back some.  Overall still benefit.  No SI and can't rmember why he had them before. Residual energy not great and some ruminating. He had some concerns about sexual side effects from Paxil but agreed that no med changes were necessary despite residual symptoms of depression and anxiety.  08/14/2019 appointment, the following is noted:  He scheduled urgently. Vaccinated. Angry easily and exhausted and  everything is a chore.  Exhausted. Never had appt with CPAP company.  CPAP is 56 years old and on same settings as in the past.  CPAP machine is not working properly but when it did it was helpful for alertness and energy.  Disc need to call them to have it evaluated. To bed 10-7.  Don't feel rested in morning but used to feel rested when first started paroxetine. Plan: Increase Lithium continue to 900 mg daily for irritability  11/19/19 appt with the following noted: He increased to 900 mg and then reduced it but doesn't know why.  Reduced it to 300 nightly. Irritability much better.  Once anger since here with rage but not outward. Still on paroxetine 40, olanzapine 5, Wellbutrin 75 AM. Getting tired in afternoon and worrying about getting depressed but he's not depressed now.  Don't enjoy things like he used to.  Les interest and excitement. Appetite and sleep are normal.   Still adjusting without alcohol and socialization. Worrying about lack of sex drive with paroxetine. Can still ruminate on things until he gets some reassurance through talking with people. Plan: Continue low-dose lithium 300 mg daily Continue Paxil.  Did not feel well at 60 mg so we will continue 40 mg.  Likely to lose anxiety benefit if change it. Continue low-dose Wellbutrin 75  Tolerating Wellbutrin and it seems to help. He didn't tolerate the increase.   Continue olanzapine 5 mg  02/18/2020 appointment with following noted: Open to trying increase paroxetine again bc ruminating wears him out and gets fatigued and it wears him out though is 80% better vs before paroxetine. Doesn't remember trying higher paroxetine.  Ruminates on relationships or work.  Whenever he can talk about it with someone it helps tremendously but also realizes paroxetine helps. Anxiety affects dreams and NM too.  Up and down a little.  Anxiety drove depression before paroxetine.  Best med he's tried. SOBER 1 YEAR SE no problem.  Except gained  25# in 4 years.  No change in diet and exercise.  No increase in appetite and no food cravings.  Seeing PCP soon. Plan: Increase paroxetine trial to 60 for rumination.  Disc SE.  05/26/20 appt noted: No benefit increase paroxetine nor SE.  Wants to reduce back to 30 mg paroxetine and stop lithium bc it didn't seem to help.   4 days of Calm app has seemed to help rumination.  Does it in afternoon and before bed and it seemed to helps.  Doing a 10 min meditation and it helps.   Dep 4/10.  Anxiety 4/10. And a lot better the last few days.   Plan: Reduce lithium by 1 tablet per week.  Call if there is any suicidal thought. Starting April 1 reduce paroxetine to 40 mg daily. Continue low-dose Wellbutrin 75  Tolerating Wellbutrin and it seems to help. He didn't tolerate the increase.   If doing OK will try taper as  he suggested at next visit. Continue olanzapine 5 mg nightly it was helpful for depression and anxiety.  07/01/2020 appt with following noted: Moved appt up. Now says he understated depression level when he was here the last time. Restarted lithium bc felt more depressed without it. Reduced paroxetine to 40 mg daily. Lethargic.  Mind won't calm down.  Nonrestorative sleep but 8 hours.  Doesn't think sleep is deep enough.  Tense. Goes to [employer]. Started counseling. Wants to try something different with meds. Can have mixed sx for 6 weeks with increased interest in things and motivation and want to spend money but still feels depressed.  3 times in the last year.   Seeing [name] PhD  On disability for 7-8 years.  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 .  Buspar stopped DT little effect. Xanax,  Olanzapine 10  lithium  900  Review of Systems:  Review of Systems  Constitutional: Negative for unexpected weight change.  Cardiovascular: Negative  for palpitations.  Neurological: Positive for weakness. Negative for tremors.  Psychiatric/Behavioral: Positive for depression and dysphoric mood. Negative for agitation, behavioral problems, confusion, decreased concentration, hallucinations, self-injury, sleep disturbance and suicidal ideas. The patient is nervous/anxious. The patient is not hyperactive.     Medications: I have reviewed the patient's current medications.  Current Outpatient Medications  Medication Sig Dispense Refill  . amLODipine (NORVASC) 5 MG tablet Take daily 90 tablet 3  . atorvastatin (LIPITOR) 40 MG tablet TAKE 1 TABLET(40 MG) BY MOUTH DAILY 90 tablet 3  . buPROPion (WELLBUTRIN) 75 MG tablet Take 1 tablet (75 mg total) by mouth in the morning. 90 tablet 1  . Cyanocobalamin (VITAMIN B-12 PO) Take by mouth.    . lamoTRIgine (LAMICTAL) 25 MG tablet 1 daily for 2 weeks, then 2 daily for 2 weeks, then 4 daily 120 tablet 0  . lisinopril (ZESTRIL) 20 MG tablet TAKE 1 TABLET(20 MG) BY MOUTH DAILY 90 tablet 0  . OLANZapine (ZYPREXA) 5 MG tablet Take 1 tablet (5 mg total) by mouth at bedtime. 90 tablet 1  . PARoxetine (PAXIL) 30 MG tablet Take 2 tablets (60 mg total) by mouth daily. 180 tablet 1  . lithium carbonate 300 MG capsule Take 3 capsules (900 mg total) by mouth daily. 270 capsule 0   No current facility-administered medications for this visit.    Medication Side Effects: sexual,  Allergies: No Known Allergies  Past Medical History:  Diagnosis Date  . Anxiety   . Bimalleolar fracture of left ankle   . Depression   . Heart murmur   . Sleep apnea    uses CPAP nightly    Family History  Problem Relation Age of Onset  . Cancer Maternal Grandfather   . Colon cancer Neg Hx   . Colon polyps Neg Hx   . Esophageal cancer Neg Hx   . Rectal cancer Neg Hx   . Stomach cancer Neg Hx     Social History   Socioeconomic History  . Marital status: Single    Spouse name: Not on file  . Number of children: Not on  file  . Years of education: Not on file  . Highest education level: Not on file  Occupational History  . Not on file  Tobacco Use  . Smoking status: Current Every Day Smoker    Packs/day: 1.50  Years: 0.00    Pack years: 0.00    Types: Cigarettes  . Smokeless tobacco: Never Used  Vaping Use  . Vaping Use: Never used  Substance and Sexual Activity  . Alcohol use: Not Currently  . Drug use: No  . Sexual activity: Not on file  Other Topics Concern  . Not on file  Social History Narrative  . Not on file   Social Determinants of Health   Financial Resource Strain: Low Risk   . Difficulty of Paying Living Expenses: Not very hard  Food Insecurity: Not on file  Transportation Needs: No Transportation Needs  . Lack of Transportation (Medical): No  . Lack of Transportation (Non-Medical): No  Physical Activity: Not on file  Stress: Not on file  Social Connections: Not on file  Intimate Partner Violence: Not on file    Past Medical History, Surgical history, Social history, and Family history were reviewed and updated as appropriate.   Please see review of systems for further details on the patient's review from today.   Objective:   Physical Exam:  There were no vitals taken for this visit.  Physical Exam Constitutional:      General: He is not in acute distress.    Appearance: Normal appearance. He is well-developed.     Comments: Red face  Musculoskeletal:        General: No deformity.  Neurological:     Mental Status: He is alert and oriented to person, place, and time.     Motor: No tremor.     Coordination: Coordination normal.     Gait: Gait normal.  Psychiatric:        Attention and Perception: Attention normal. He is attentive.        Mood and Affect: Mood is anxious and depressed. Affect is not labile, blunt, angry or inappropriate.        Speech: Speech normal.        Behavior: Behavior normal. Behavior is not agitated.        Thought Content: Thought  content normal. Thought content does not include homicidal or suicidal ideation. Thought content does not include homicidal or suicidal plan.        Cognition and Memory: Cognition normal.        Judgment: Judgment normal.     Comments: Insight  Fair. Reports overall depression and anxiety are not as good as he reported last visit        Lab Review:     Component Value Date/Time   NA 136 06/16/2020 1342   K 4.4 06/16/2020 1342   CL 100 06/16/2020 1342   CO2 28 06/16/2020 1342   GLUCOSE 99 06/16/2020 1342   BUN 16 06/16/2020 1342   CREATININE 1.20 06/16/2020 1342   CALCIUM 9.9 06/16/2020 1342   PROT 7.5 06/16/2020 1342   ALBUMIN 4.7 06/16/2020 1342   AST 22 06/16/2020 1342   ALT 34 06/16/2020 1342   ALKPHOS 119 (H) 06/16/2020 1342   BILITOT 0.6 06/16/2020 1342       Component Value Date/Time   WBC 9.9 06/16/2020 1342   RBC 5.08 06/16/2020 1342   HGB 15.7 06/16/2020 1342   HGB 15.2 05/05/2010 1049   HCT 45.2 06/16/2020 1342   HCT 44.8 05/05/2010 1049   PLT 252.0 06/16/2020 1342   PLT 193 05/05/2010 1049   MCV 89.1 06/16/2020 1342   MCV 89.6 05/05/2010 1049   MCH 30.4 05/05/2010 1049   MCHC 34.7 06/16/2020 1342  RDW 13.0 06/16/2020 1342   RDW 13.1 05/05/2010 1049   LYMPHSABS 3.0 06/16/2020 1342   LYMPHSABS 1.8 05/05/2010 1049   MONOABS 0.7 06/16/2020 1342   MONOABS 0.4 05/05/2010 1049   EOSABS 0.3 06/16/2020 1342   EOSABS 0.2 05/05/2010 1049   BASOSABS 0.0 06/16/2020 1342   BASOSABS 0.0 05/05/2010 1049    No results found for: POCLITH, LITHIUM   No results found for: PHENYTOIN, PHENOBARB, VALPROATE, CBMZ   .res Assessment: Plan:     Keneth was seen today for follow-up, depression and anxiety.  Diagnoses and all orders for this visit:  Major depressive disorder with single episode, in partial remission (HCC) -     lamoTRIgine (LAMICTAL) 25 MG tablet; 1 daily for 2 weeks, then 2 daily for 2 weeks, then 4 daily -     lithium carbonate 300 MG capsule; Take  3 capsules (900 mg total) by mouth daily.    Patient seen for early appointment today.  At appointment June 14, 2019 patient was markedly more depressed and anxious somewhat unexpectedly.  He is remained sober.  It was felt that his alcohol was a major driving force for his depression and anxiety but he has remained sober and has had a worsening depression and anxiety. at the visit June 14, 2019 we had increase olanzapine back to 5 mg daily and also added lithium 600 mg daily both to potentiate the antidepressant and because of suicidal thoughts.  While the patient denies symptoms of grandiosity or euphoria he does describe some other manic cycling symptoms including increased interest, increased goal-directed behaviors, increased urge to spend that will last for 6 weeks or so and then stop.  He has these cycles about 3 times a year.  This is suggestive of a bipolar predisposition but I am not yet changing his diagnosis.  However we will consider medication changes that could help to address this given his lack of adequate response to antidepressants.  Continue CPAP use. He gets some benefit with it but machine is old and needs to be replaced. When he had better functioning CPAP machine it helped his alertness, mood, productivity and concentration.   Discussed potential metabolic side effects associated with atypical antipsychotics, as well as potential risk for movement side effects. Advised pt to contact office if movement side effects occur. Disc weight gain risk with olanzapine. He agrees.  Call if you have any problems with this.  He remains sober.  He wants to consider counseling to work through some personal issues as well as address specific symptoms like his anger and irritability.  Increase  lithium back to 3 daily to see if depression is better and less mood cycling.  He admits to feeling worse when he stopped the lithium  Continue the reduce paroxetine to 40 mg daily. DC  Wellbutrin  Lamotrigine trial 25 mg to 100 mg daily over 4 weeks.  Continue olanzapine 5 mg nightly it was helpful for depression and anxiety.  FU 12 weeks  Lynder Parents, MD, DFAPA   Please see After Visit Summary for patient specific instructions.  Future Appointments  Date Time Provider [place]  08/26/2020  1:45 PM Cottle, Billey Co., MD [facility] None  12/02/2020  4:00 PM [facility] PHARMACIST [facility]    No orders of the defined types were placed in this encounter.     -------------------------------

## 2020-07-01 NOTE — Patient Instructions (Signed)
Start lamotrigine as prescribed  Stop bupropion  Increase lithium to 3 daily

## 2020-07-10 ENCOUNTER — Telehealth: Payer: Self-pay | Admitting: Psychiatry

## 2020-07-10 NOTE — Telephone Encounter (Signed)
Please review

## 2020-07-10 NOTE — Telephone Encounter (Signed)
Goal for that level of the dosage to cause dizziness but not impossible.  I would recommend take 1/2 tablet today and if he's ok. Then take 1 tablet daily for another week before returning to planned 2 daily.

## 2020-07-10 NOTE — Telephone Encounter (Signed)
Pt has been informed.

## 2020-07-10 NOTE — Telephone Encounter (Signed)
Pt left a message stating that he has been taking the lamictal 25 mg since Sunday. Yesterday he started feeling dizzy, unsteady and unstable. He didn't feel good or comfortable driving. He didn't take one today and he is better. However, he said that if you want him to take one today he will. He is suppose to increase to 2 pills on Sunday. Please give him a call at [phone] and let him know what to do?

## 2020-07-22 DIAGNOSIS — G4733 Obstructive sleep apnea (adult) (pediatric): Secondary | ICD-10-CM | POA: Diagnosis not present

## 2020-07-22 DIAGNOSIS — I10 Essential (primary) hypertension: Secondary | ICD-10-CM | POA: Diagnosis not present

## 2020-07-23 ENCOUNTER — Telehealth: Payer: Self-pay | Admitting: Adult Health

## 2020-07-23 ENCOUNTER — Telehealth: Payer: Self-pay | Admitting: Psychiatry

## 2020-07-23 NOTE — Telephone Encounter (Signed)
Very uncommon to cause tiredness.  If he wants to be patient and evaluated then do not go up on the dose and leave it there for 2 to 3 weeks and see if the tiredness goes away.  On the other hand if he is too frustrated with dealing with it he is at a low dose and he can just stop the lamotrigine and see if the tiredness goes away.  If he does the latter then let us know what happens.

## 2020-07-23 NOTE — Telephone Encounter (Signed)
Pt left message reporting he is taking Lamotrigine started @ 25 mg now up to 50 mg. He was tired before,  now he is exalted. Would Lamotrigine be causing this? Contact # [phone]. APT 6/8

## 2020-07-23 NOTE — Telephone Encounter (Signed)
Spoke with the Jonathon Walker and advised him per the results from lab testing done in March, Cory recommended OTC vitamin D-5000 units daily and a B12 supplement, with no particular dose recommended.

## 2020-07-23 NOTE — Telephone Encounter (Signed)
Patient is calling and stated that provider recommended him take vitamin d but didn't know particular kind. Pt is requesting a call back, please advise. CB is [phone]

## 2020-07-24 NOTE — Telephone Encounter (Signed)
Pt informed.He did not say if he wanted to stop,but that he will take that into consideration.

## 2020-07-28 ENCOUNTER — Ambulatory Visit (INDEPENDENT_AMBULATORY_CARE_PROVIDER_SITE_OTHER): Payer: Medicare Other

## 2020-07-28 DIAGNOSIS — Z0000 Encounter for general adult medical examination without abnormal findings: Secondary | ICD-10-CM

## 2020-07-28 NOTE — Patient Instructions (Signed)
Jonathon Walker , Thank you for taking time to come for your Medicare Wellness Visit. I appreciate your ongoing commitment to your health goals. Please review the following plan we discussed and let me know if I can assist you in the future.   Screening recommendations/referrals: Colonoscopy: current due 08/30/2022 Recommended yearly ophthalmology/optometry visit for glaucoma screening and checkup Recommended yearly dental visit for hygiene and checkup  Vaccinations: Influenza vaccine: current due fall 2022 Pneumococcal vaccine: completed  Tdap vaccine: current  Shingles vaccine: will obtain local pharmacy   Advanced directives: will provide copies   Conditions/risks identified: none   Next appointment: 11/22/2020  400pm pharmacy   Preventive Care 40-64 Years, Male Preventive care refers to lifestyle choices and visits with your health care provider that can promote health and wellness. What does preventive care include?  A yearly physical exam. This is also called an annual well check.  Dental exams once or twice a year.  Routine eye exams. Ask your health care provider how often you should have your eyes checked.  Personal lifestyle choices, including:  Daily care of your teeth and gums.  Regular physical activity.  Eating a healthy diet.  Avoiding tobacco and drug use.  Limiting alcohol use.  Practicing safe sex.  Taking low-dose aspirin every day starting at age 46. What happens during an annual well check? The services and screenings done by your health care provider during your annual well check will depend on your age, overall health, lifestyle risk factors, and family history of disease. Counseling  Your health care provider may ask you questions about your:  Alcohol use.  Tobacco use.  Drug use.  Emotional well-being.  Home and relationship well-being.  Sexual activity.  Eating habits.  Work and work Statistician. Screening  You may have the  following tests or measurements:  Height, weight, and BMI.  Blood pressure.  Lipid and cholesterol levels. These may be checked every 5 years, or more frequently if you are over 23 years old.  Skin check.  Lung cancer screening. You may have this screening every year starting at age 42 if you have a 30-pack-year history of smoking and currently smoke or have quit within the past 15 years.  Fecal occult blood test (FOBT) of the stool. You may have this test every year starting at age 25.  Flexible sigmoidoscopy or colonoscopy. You may have a sigmoidoscopy every 5 years or a colonoscopy every 10 years starting at age 89.  Prostate cancer screening. Recommendations will vary depending on your family history and other risks.  Hepatitis C blood test.  Hepatitis B blood test.  Sexually transmitted disease (STD) testing.  Diabetes screening. This is done by checking your blood sugar (glucose) after you have not eaten for a while (fasting). You may have this done every 1-3 years. Discuss your test results, treatment options, and if necessary, the need for more tests with your health care provider. Vaccines  Your health care provider may recommend certain vaccines, such as:  Influenza vaccine. This is recommended every year.  Tetanus, diphtheria, and acellular pertussis (Tdap, Td) vaccine. You may need a Td booster every 10 years.  Zoster vaccine. You may need this after age 9.  Pneumococcal 13-valent conjugate (PCV13) vaccine. You may need this if you have certain conditions and have not been vaccinated.  Pneumococcal polysaccharide (PPSV23) vaccine. You may need one or two doses if you smoke cigarettes or if you have certain conditions. Talk to your health care provider about which  screenings and vaccines you need and how often you need them. This information is not intended to replace advice given to you by your health care provider. Make sure you discuss any questions you have with  your health care provider. Document Released: 04/03/2015 Document Revised: 11/25/2015 Document Reviewed: 01/06/2015 Elsevier Interactive Patient Education  2017 [place] Prevention in the Home Falls can cause injuries. They can happen to people of all ages. There are many things you can do to make your home safe and to help prevent falls. What can I do on the outside of my home?  Regularly fix the edges of walkways and driveways and fix any cracks.  Remove anything that might make you trip as you walk through a door, such as a raised step or threshold.  Trim any bushes or trees on the path to your home.  Use bright outdoor lighting.  Clear any walking paths of anything that might make someone trip, such as rocks or tools.  Regularly check to see if handrails are loose or broken. Make sure that both sides of any steps have handrails.  Any raised decks and porches should have guardrails on the edges.  Have any leaves, snow, or ice cleared regularly.  Use sand or salt on walking paths during winter.  Clean up any spills in your garage right away. This includes oil or grease spills. What can I do in the bathroom?  Use night lights.  Install grab bars by the toilet and in the tub and shower. Do not use towel bars as grab bars.  Use non-skid mats or decals in the tub or shower.  If you need to sit down in the shower, use a plastic, non-slip stool.  Keep the floor dry. Clean up any water that spills on the floor as soon as it happens.  Remove soap buildup in the tub or shower regularly.  Attach bath mats securely with double-sided non-slip rug tape.  Do not have throw rugs and other things on the floor that can make you trip. What can I do in the bedroom?  Use night lights.  Make sure that you have a light by your bed that is easy to reach.  Do not use any sheets or blankets that are too big for your bed. They should not hang down onto the floor.  Have a firm  chair that has side arms. You can use this for support while you get dressed.  Do not have throw rugs and other things on the floor that can make you trip. What can I do in the kitchen?  Clean up any spills right away.  Avoid walking on wet floors.  Keep items that you use a lot in easy-to-reach places.  If you need to reach something above you, use a strong step stool that has a grab bar.  Keep electrical cords out of the way.  Do not use floor polish or wax that makes floors slippery. If you must use wax, use non-skid floor wax.  Do not have throw rugs and other things on the floor that can make you trip. What can I do with my stairs?  Do not leave any items on the stairs.  Make sure that there are handrails on both sides of the stairs and use them. Fix handrails that are broken or loose. Make sure that handrails are as long as the stairways.  Check any carpeting to make sure that it is firmly attached to the stairs.  Fix any carpet that is loose or worn.  Avoid having throw rugs at the top or bottom of the stairs. If you do have throw rugs, attach them to the floor with carpet tape.  Make sure that you have a light switch at the top of the stairs and the bottom of the stairs. If you do not have them, ask someone to add them for you. What else can I do to help prevent falls?  Wear shoes that:  Do not have high heels.  Have rubber bottoms.  Are comfortable and fit you well.  Are closed at the toe. Do not wear sandals.  If you use a stepladder:  Make sure that it is fully opened. Do not climb a closed stepladder.  Make sure that both sides of the stepladder are locked into place.  Ask someone to hold it for you, if possible.  Clearly mark and make sure that you can see:  Any grab bars or handrails.  First and last steps.  Where the edge of each step is.  Use tools that help you move around (mobility aids) if they are needed. These  include:  Canes.  Walkers.  Scooters.  Crutches.  Turn on the lights when you go into a dark area. Replace any light bulbs as soon as they burn out.  Set up your furniture so you have a clear path. Avoid moving your furniture around.  If any of your floors are uneven, fix them.  If there are any pets around you, be aware of where they are.  Review your medicines with your doctor. Some medicines can make you feel dizzy. This can increase your chance of falling. Ask your doctor what other things that you can do to help prevent falls. This information is not intended to replace advice given to you by your health care provider. Make sure you discuss any questions you have with your health care provider. Document Released: 01/01/2009 Document Revised: 08/13/2015 Document Reviewed: 04/11/2014 Elsevier Interactive Patient Education  2017 [employer].

## 2020-07-28 NOTE — Progress Notes (Signed)
Subjective:   Jonathon Walker is a 55 y.o. male who presents for an Initial Medicare Annual Wellness Visit.   I connected with Jonathon Walker today by telephone and verified that I am speaking with the correct person using two identifiers. Location patient: home Location provider: work Persons participating in the virtual visit: patient, provider.   I discussed the limitations, risks, security and privacy concerns of performing an evaluation and management service by telephone and the availability of in person appointments. I also discussed with the patient that there may be a patient responsible charge related to this service. The patient expressed understanding and verbally consented to this telephonic visit.    Interactive audio and video telecommunications were attempted between this provider and patient, however failed, due to patient having technical difficulties OR patient did not have access to video capability.  We continued and completed visit with audio only.    Review of Systems    n/a       Objective:    There were no vitals filed for this visit. There is no height or weight on file to calculate BMI.  Advanced Directives 09/19/2017 09/13/2017  Does Patient Have a Medical Advance Directive? Yes Yes  Type of Paramedic of [place];Living will [place];Living will  Does patient want to make changes to medical advance directive? No - Patient declined No - Patient declined  Copy of [place] in Chart? No - copy requested -    Current Medications (verified) Outpatient Encounter Medications as of 07/28/2020  Medication Sig  . amLODipine (NORVASC) 5 MG tablet Take daily  . atorvastatin (LIPITOR) 40 MG tablet TAKE 1 TABLET(40 MG) BY MOUTH DAILY  . buPROPion (WELLBUTRIN) 75 MG tablet Take 1 tablet (75 mg total) by mouth in the morning.  . Cyanocobalamin (VITAMIN B-12 PO) Take by mouth.  . lamoTRIgine (LAMICTAL) 25  MG tablet 1 daily for 2 weeks, then 2 daily for 2 weeks, then 4 daily  . lisinopril (ZESTRIL) 20 MG tablet TAKE 1 TABLET(20 MG) BY MOUTH DAILY  . lithium carbonate 300 MG capsule Take 3 capsules (900 mg total) by mouth daily.  [name] OLANZapine (ZYPREXA) 5 MG tablet Take 1 tablet (5 mg total) by mouth at bedtime.  [name] PARoxetine (PAXIL) 30 MG tablet Take 2 tablets (60 mg total) by mouth daily.   No facility-administered encounter medications on file as of 07/28/2020.    Allergies (verified) Patient has no known allergies.   History: Past Medical History:  Diagnosis Date  . Anxiety   . Bimalleolar fracture of left ankle   . Depression   . Heart murmur   . Sleep apnea    uses CPAP nightly   Past Surgical History:  Procedure Laterality Date  . ORIF ANKLE FRACTURE Left 09/19/2017   Procedure: OPEN REDUCTION INTERNAL FIXATION (ORIF) ANKLE FRACTURE;  Surgeon: Renette Butters, MD;  Location: [facility];  Service: Orthopedics;  Laterality: Left;  . TOOTH EXTRACTION     Family History  Problem Relation Age of Onset  . Cancer Maternal Grandfather   . Colon cancer Neg Hx   . Colon polyps Neg Hx   . Esophageal cancer Neg Hx   . Rectal cancer Neg Hx   . Stomach cancer Neg Hx    Social History   Socioeconomic History  . Marital status: Single    Spouse name: Not on file  . Number of children: Not on file  .  Years of education: Not on file  . Highest education level: Not on file  Occupational History  . Not on file  Tobacco Use  . Smoking status: Current Every Day Smoker    Packs/day: 1.50    Years: 0.00    Pack years: 0.00    Types: Cigarettes  . Smokeless tobacco: Never Used  Vaping Use  . Vaping Use: Never used  Substance and Sexual Activity  . Alcohol use: Not Currently  . Drug use: No  . Sexual activity: Not on file  Other Topics Concern  . Not on file  Social History Narrative  . Not on file   Social Determinants of Health   Financial Resource Strain:  Low Risk   . Difficulty of Paying Living Expenses: Not very hard  Food Insecurity: Not on file  Transportation Needs: No Transportation Needs  . Lack of Transportation (Medical): No  . Lack of Transportation (Non-Medical): No  Physical Activity: Not on file  Stress: Not on file  Social Connections: Not on file    Tobacco Counseling Ready to quit: Not Answered Counseling given: Not Answered   Clinical Intake:                 Diabetic?no         Activities of Daily Living No flowsheet data found.  Patient Care Team: [name], NP as PCP - General (Family Medicine) Jettie Booze, MD as PCP - Cardiology (Cardiology) Cottle, Billey Co., MD as Attending Physician (Psychiatry) Jettie Booze, MD as Consulting Physician (Cardiology) Viona Gilmore, [facility] as Pharmacist (Pharmacist)  Indicate any recent Medical Services you may have received from other than [facility] providers in the past year (date may be approximate).     Assessment:   This is a routine wellness examination for Jonathon Walker.  Hearing/Vision screen No exam data present  Dietary issues and exercise activities discussed:    Goals Addressed   None    Depression Screen No flowsheet data found.  Fall Risk No flowsheet data found.  FALL RISK PREVENTION PERTAINING TO THE HOME:  Any stairs in or around the home? Yes  If so, are there any without handrails? No  Home free of loose throw rugs in walkways, pet beds, electrical cords, etc? Yes  Adequate lighting in your home to reduce risk of falls? Yes   ASSISTIVE DEVICES UTILIZED TO PREVENT FALLS:  Life alert? No  Use of a cane, walker or w/c? No  Grab bars in the bathroom? No  Shower chair or bench in shower? No  Elevated toilet seat or a handicapped toilet? No    Cognitive Function:       Normal cognitive status assessed by direct observation by this Nurse Health Advisor. No abnormalities found.     Immunizations Immunization History  Administered Date(s) Administered  . PFIZER(Purple Top)SARS-COV-2 Vaccination 07/03/2019, 07/24/2019, 02/27/2020  . PNEUMOCOCCAL CONJUGATE-20 06/16/2020  . Tdap 06/07/2019     TDAP status: Up to date  Flu Vaccine status: Up to date  Pneumococcal vaccine status: Up to date  Covid-19 vaccine status: Completed vaccines  Qualifies for Shingles Vaccine? Yes   Zostavax completed No   Shingrix Completed?: No.    Education has been provided regarding the importance of this vaccine. Patient has been advised to call insurance company to determine out of pocket expense if they have not yet received this vaccine. Advised may also receive vaccine at local pharmacy or Health Dept. Verbalized acceptance and understanding.  Screening  Tests Health Maintenance  Topic Date Due  . HIV Screening  Never done  . INFLUENZA VACCINE  10/19/2020  . COLONOSCOPY (Pts 45-41yrs Insurance coverage will need to be confirmed)  08/30/2022  . TETANUS/TDAP  06/06/2029  . COVID-19 Vaccine  Completed  . Hepatitis C Screening  Completed  . HPV VACCINES  Aged Out    Health Maintenance  Health Maintenance Due  Topic Date Due  . HIV Screening  Never done    Colorectal cancer screening: Type of screening: Colonoscopy. Completed 08/30/2019. Repeat every 3 years  Lung Cancer Screening: (Low Dose CT Chest recommended if Age 63-80 years, 30 pack-year currently smoking OR have quit w/in 15years.) does not qualify.   Lung Cancer Screening Referral: n/a  Additional Screening:  Hepatitis C Screening: does qualify; Completed 06/16/2020   Vision Screening: Recommended annual ophthalmology exams for early detection of glaucoma and other disorders of the eye. Is the patient up to date with their annual eye exam?  No  Who is the provider or what is the name of the office in which the patient attends annual eye exams? n/a If pt is not established with a provider, would they like  to be referred to a provider to establish care? No .   Dental Screening: Recommended annual dental exams for proper oral hygiene  Community Resource Referral / Chronic Care Management: CRR required this visit?  No   CCM required this visit?  No      Plan:     I have personally reviewed and noted the following in the patient's chart:   . Medical and social history . Use of alcohol, tobacco or illicit drugs  . Current medications and supplements including opioid prescriptions. Patient is not currently taking opioid prescriptions. . Functional ability and status . Nutritional status . Physical activity . Advanced directives . List of other physicians . Hospitalizations, surgeries, and ER visits in previous 12 months . Vitals . Screenings to include cognitive, depression, and falls . Referrals and appointments  In addition, I have reviewed and discussed with patient certain preventive protocols, quality metrics, and best practice recommendations. A written personalized care plan for preventive services as well as general preventive health recommendations were provided to patient.     Randel Pigg, LPN   04/17/7865   Nurse Notes: none

## 2020-07-29 ENCOUNTER — Other Ambulatory Visit: Payer: Self-pay | Admitting: Adult Health

## 2020-07-29 ENCOUNTER — Other Ambulatory Visit: Payer: Self-pay | Admitting: Psychiatry

## 2020-07-29 DIAGNOSIS — I10 Essential (primary) hypertension: Secondary | ICD-10-CM

## 2020-07-29 DIAGNOSIS — F324 Major depressive disorder, single episode, in partial remission: Secondary | ICD-10-CM

## 2020-08-05 ENCOUNTER — Ambulatory Visit: Payer: Medicare Other | Admitting: Podiatry

## 2020-08-05 ENCOUNTER — Encounter: Payer: Self-pay | Admitting: Podiatry

## 2020-08-05 ENCOUNTER — Ambulatory Visit (INDEPENDENT_AMBULATORY_CARE_PROVIDER_SITE_OTHER): Payer: Medicare Other

## 2020-08-05 ENCOUNTER — Other Ambulatory Visit: Payer: Self-pay

## 2020-08-05 DIAGNOSIS — L84 Corns and callosities: Secondary | ICD-10-CM | POA: Diagnosis not present

## 2020-08-05 DIAGNOSIS — M79672 Pain in left foot: Secondary | ICD-10-CM

## 2020-08-05 DIAGNOSIS — M778 Other enthesopathies, not elsewhere classified: Secondary | ICD-10-CM

## 2020-08-05 MED ORDER — TRIAMCINOLONE ACETONIDE 10 MG/ML IJ SUSP
10.0000 mg | Freq: Once | INTRAMUSCULAR | Status: AC
  Administered 2020-08-05: 10 mg

## 2020-08-05 NOTE — Progress Notes (Signed)
Subjective:   Patient ID: Jonathon Walker, male   DOB: 55 y.o.   MRN: [id number]   HPI Patient presents stating his get a lot of pain in the outside of his left foot with callus formation that makes it hard to walk.  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  Sterile prep and injected the fifth MPJ 3 mg Dexasone Kenalog 5 mg Xylocaine did debridement of lesion advised on offloading and reappoint to recheck.  Patient will be seen back and is encouraged to call questions  X-rays indicated enlargement around the head of the fifth metatarsal left no fluid buildup

## 2020-08-08 ENCOUNTER — Other Ambulatory Visit: Payer: Self-pay | Admitting: Psychiatry

## 2020-08-08 DIAGNOSIS — F324 Major depressive disorder, single episode, in partial remission: Secondary | ICD-10-CM

## 2020-08-20 ENCOUNTER — Other Ambulatory Visit: Payer: Self-pay | Admitting: Psychiatry

## 2020-08-20 DIAGNOSIS — F324 Major depressive disorder, single episode, in partial remission: Secondary | ICD-10-CM

## 2020-08-21 ENCOUNTER — Telehealth: Payer: Self-pay | Admitting: Pharmacist

## 2020-08-21 NOTE — Telephone Encounter (Signed)
Please contact pt and what dose he is taking now.    Next apt 06/08

## 2020-08-21 NOTE — Chronic Care Management (AMB) (Signed)
Chronic Care Management Pharmacy Assistant   Name: Jonathon Walker  MRN: [id number] DOB: 06-14-1965  Reason for Encounter: Disease State/ Hypertension Assessment Call.   Conditions to be addressed/monitored: HTN  Recent office visits:  06/16/20 [name] NP (PCP) - seen for routine general exam and other chronic conditions. Changed amlodipine from 2.5mg  daily to 5mg  daily. Will follow up after getting blood work results.  Recent consult visits:  07/01/20 Lynder Parents MD (Psychiatry) - seen for follow up on major depressive disorder with single episode in partial remission. Patient started on lamotrigine 25mg  1 daily for 2 weeks then 2 daily for 2 weeks then 4 times daily. Changed lithium from 600mg  daily to 900mg  daily. Follow up in 12 weeks.   Hospital visits:  None in previous 6 months  Medications: Outpatient Encounter Medications as of 08/21/2020  Medication Sig   amLODipine (NORVASC) 5 MG tablet Take daily   atorvastatin (LIPITOR) 40 MG tablet TAKE 1 TABLET(40 MG) BY MOUTH DAILY   buPROPion (WELLBUTRIN) 75 MG tablet Take 1 tablet (75 mg total) by mouth in the morning. (Patient not taking: Reported on 07/28/2020)   Cyanocobalamin (VITAMIN B-12 PO) Take by mouth.   lamoTRIgine (LAMICTAL) 25 MG tablet 1 daily for 2 weeks, then 2 daily for 2 weeks, then 4 daily   lisinopril (ZESTRIL) 20 MG tablet TAKE 1 TABLET(20 MG) BY MOUTH DAILY   lithium carbonate 300 MG capsule TAKE 3 CAPSULES(900 MG) BY MOUTH AT BEDTIME   OLANZapine (ZYPREXA) 5 MG tablet Take 1 tablet (5 mg total) by mouth at bedtime.   PARoxetine (PAXIL) 30 MG tablet Take 2 tablets (60 mg total) by mouth daily.   No facility-administered encounter medications on file as of 08/21/2020.    Reviewed chart prior to disease state call. Spoke with patient regarding BP  Recent Office Vitals: BP Readings from Last 3 Encounters:  06/16/20 140/90  08/30/19 110/62  07/08/19 130/72   Pulse Readings from Last 3 Encounters:   06/16/20 76  08/30/19 (!) 59  07/08/19 69    Wt Readings from Last 3 Encounters:  06/16/20 273 lb 6.4 oz (124 kg)  08/30/19 260 lb (117.9 kg)  08/16/19 260 lb (117.9 kg)     Kidney Function Lab Results  Component Value Date/Time   CREATININE 1.20 06/16/2020 01:42 PM   CREATININE 1.14 06/13/2019 08:20 AM   GFR 68.19 06/16/2020 01:42 PM    BMP Latest Ref Rng & Units 06/16/2020 06/13/2019 05/31/2018  Glucose 70 - 99 mg/dL 99 109(H) 103(H)  BUN 6 - 23 mg/dL 16 19 12   Creatinine 0.40 - 1.50 mg/dL 1.20 1.14 1.14  Sodium 135 - 145 mEq/L 136 139 139  Potassium 3.5 - 5.1 mEq/L 4.4 4.2 4.4  Chloride 96 - 112 mEq/L 100 104 102  CO2 19 - 32 mEq/L 28 28 27   Calcium 8.4 - 10.5 mg/dL 9.9 9.6 9.8    Current antihypertensive regimen:  Amlodipine 5mg  - take once daily.  Lisinopril 20mg  - take once daily.  How often are you checking your Blood Pressure?  Current home BP readings:  What recent interventions/DTPs have been made by any provider to improve Blood Pressure control since last CPP Visit: amlodipine increased to 61mf daily from 2.5mg  daily.  Any recent hospitalizations or ED visits since last visit with CPP? No What diet changes have been made to improve Blood Pressure Control?   What exercise is being done to improve your Blood Pressure Control?    Adherence  Review: Is the patient currently on ACE/ARB medication? Yes Does the patient have >5 day gap between last estimated fill dates? No  Multiple unsuccessful attempts to reach patient by phone.   Star Rating Drugs:  Lisinopril 20mg  - last filled on 08/09/20 90DS at [facility] Atorvastatin 40mg  - last filled on 08/09/20 90DS at [facility] [phone]

## 2020-08-22 DIAGNOSIS — G4733 Obstructive sleep apnea (adult) (pediatric): Secondary | ICD-10-CM | POA: Diagnosis not present

## 2020-08-22 DIAGNOSIS — I10 Essential (primary) hypertension: Secondary | ICD-10-CM | POA: Diagnosis not present

## 2020-08-26 ENCOUNTER — Ambulatory Visit (INDEPENDENT_AMBULATORY_CARE_PROVIDER_SITE_OTHER): Payer: Medicare Other | Admitting: Psychiatry

## 2020-08-26 ENCOUNTER — Other Ambulatory Visit: Payer: Self-pay

## 2020-08-26 ENCOUNTER — Encounter: Payer: Self-pay | Admitting: Psychiatry

## 2020-08-26 DIAGNOSIS — F1021 Alcohol dependence, in remission: Secondary | ICD-10-CM | POA: Diagnosis not present

## 2020-08-26 DIAGNOSIS — F411 Generalized anxiety disorder: Secondary | ICD-10-CM

## 2020-08-26 DIAGNOSIS — G4733 Obstructive sleep apnea (adult) (pediatric): Secondary | ICD-10-CM

## 2020-08-26 DIAGNOSIS — F324 Major depressive disorder, single episode, in partial remission: Secondary | ICD-10-CM

## 2020-08-26 DIAGNOSIS — F422 Mixed obsessional thoughts and acts: Secondary | ICD-10-CM

## 2020-08-26 MED ORDER — LAMOTRIGINE 150 MG PO TABS: 150.0000 mg | ORAL_TABLET | Freq: Four times a day (QID) | ORAL | 0 refills | Status: DC

## 2020-08-26 MED ORDER — OLANZAPINE 10 MG PO TABS: 10.0000 mg | ORAL_TABLET | Freq: Every day | ORAL | 0 refills | Status: DC

## 2020-08-26 NOTE — Progress Notes (Signed)
Jonathon Walker [id number] 1966/02/03 55 y.o.  Subjective:   Patient ID:  Jonathon Walker is a 55 y.o. (DOB 01/15/1966) male.  Chief Complaint:  Chief Complaint  Patient presents with  . Follow-up  . Major depressive disorder with single episode, in partial r  . Depression  . Anxiety    Depression        Associated symptoms include no decreased concentration and no suicidal ideas.  Past medical history includes anxiety.   Anxiety Symptoms include nervous/anxious behavior. Patient reports no confusion, decreased concentration, palpitations or suicidal ideas.       Jonathon Walker presents to the office today for follow-up of anxiety , depression, alcohol.    seen August 7.  His anxiety was unmanaged at Paxil 40 mg and olanzapine 5 mg so olanzapine was increased to 7.5 daily.  seen December 17, 2018.  His anxiety was unmanaged.  The following change was made: Trial 1-1/2 of the 7.5 mg tablets of olanzapine for anxiety.  He is had a stay at [facility] for alcohol dependence since he was last here. Stayed 28 days and DC before Thanksgiving.  Very helpful.  Was having NM nighly before and they stopped since then.  Not drinking.  Involved in AA since then.  seen March 25, 2019.  The patient was doing better requested a reduction in olanzapine to 7.5 mg nightly to improve energy.  visit April 03, 2019.  He had remained sober and his anxiety was improved with sobriety.  He was having problems with low motivation and forgetfulness.  At the next refill it was decided olanzapine would be reduced from 7.5 to 5 mg daily. He called requesting this urgent appointment because of worsening symptoms associated with returning to work.  seen May 21, 2019.  He was doing relatively well.  The following was noted: Tolerating Wellbutrin and it seems to help. He didn't tolerate the increase. Anxiety is improved off alcohol.  Ok to reduce olanzapine to 2.5 mg daily for a month and stop it.   Hopefully low motivation will improve.   He called back March 23 stating he was more depressed and having a harder time doing routine tasks and having suicidal thoughts.  Because of worsening depression after reducing the olanzapine he was encouraged to increase olanzapine back to 5 mg nightly. He called again March 24 stating he was really struggling but was scheduled for an appointment on March 26.  As of June 14, 2019 he reports the following: Every day a real hard struggle to get through the days.  More anxious and depressed equally. Worse 2 weeks.  Last Saturday so overwhelmed by how he feels he had SI.  Everything is hard. Initial benefit paroxetine has been lost.  Awakens stressed and tired. Adequate hours of sleep.  Will be major chore just to mow grass.  Still sober. SE sexual. Getting appt with CPAP doc. Changes made include: Increasing olanzapine back to 5 mg daily a couple of days prior to the appointment due to phone call and the addition of lithium 300 mg for a few days then increase to 600 mg nightly both to augment the antidepressant and because of suicidal thoughts.  July 15, 2019 appointment, the following is noted: Doing OK with both depression and anxiety.  Kind of like a top always going in mind.  Even when I sleep, when awakens mind still going.  NM nightly for years but less than before stopped drinking.  Smaller and less  severe ones that don't wake him.  Theme can't finish things.  Both depression and anxiety 3/10.  More productive.  Paces himself.  Can live with it.  Sleep 9 hours.  Initially olanzapine stopped the ruminating but it's back some.  Overall still benefit.  No SI and can't rmember why he had them before. Residual energy not great and some ruminating. He had some concerns about sexual side effects from Paxil but agreed that no med changes were necessary despite residual symptoms of depression and anxiety.  08/14/2019 appointment, the following is noted:  He  scheduled urgently. Vaccinated. Angry easily and exhausted and everything is a chore.  Exhausted. Never had appt with CPAP company.  CPAP is 56 years old and on same settings as in the past.  CPAP machine is not working properly but when it did it was helpful for alertness and energy.  Disc need to call them to have it evaluated. To bed 10-7.  Don't feel rested in morning but used to feel rested when first started paroxetine. Plan: Increase Lithium continue to 900 mg daily for irritability  11/19/19 appt with the following noted: He increased to 900 mg and then reduced it but doesn't know why.  Reduced it to 300 nightly. Irritability much better.  Once anger since here with rage but not outward. Still on paroxetine 40, olanzapine 5, Wellbutrin 75 AM. Getting tired in afternoon and worrying about getting depressed but he's not depressed now.  Don't enjoy things like he used to.  Les interest and excitement. Appetite and sleep are normal.   Still adjusting without alcohol and socialization. Worrying about lack of sex drive with paroxetine. Can still ruminate on things until he gets some reassurance through talking with people. Plan: Continue low-dose lithium 300 mg daily Continue Paxil.  Did not feel well at 60 mg so we will continue 40 mg.  Likely to lose anxiety benefit if change it. Continue low-dose Wellbutrin 75  Tolerating Wellbutrin and it seems to help. He didn't tolerate the increase.   Continue olanzapine 5 mg  02/18/2020 appointment with following noted: Open to trying increase paroxetine again bc ruminating wears him out and gets fatigued and it wears him out though is 80% better vs before paroxetine. Doesn't remember trying higher paroxetine.  Ruminates on relationships or work.  Whenever he can talk about it with someone it helps tremendously but also realizes paroxetine helps. Anxiety affects dreams and NM too.  Up and down a little.  Anxiety drove depression before paroxetine.   Best med he's tried. SOBER 1 YEAR SE no problem.  Except gained 25# in 4 years.  No change in diet and exercise.  No increase in appetite and no food cravings.  Seeing PCP soon. Plan: Increase paroxetine trial to 60 for rumination.  Disc SE.  05/26/20 appt noted: No benefit increase paroxetine nor SE.  Wants to reduce back to 30 mg paroxetine and stop lithium bc it didn't seem to help.   4 days of Calm app has seemed to help rumination.  Does it in afternoon and before bed and it seemed to helps.  Doing a 10 min meditation and it helps.   Dep 4/10.  Anxiety 4/10. And a lot better the last few days.   Plan: Reduce lithium by 1 tablet per week.  Call if there is any suicidal thought. Starting April 1 reduce paroxetine to 40 mg daily. Continue low-dose Wellbutrin 75  Tolerating Wellbutrin and it seems to help. He didn't  tolerate the increase.   If doing OK will try taper as he suggested at next visit. Continue olanzapine 5 mg nightly it was helpful for depression and anxiety.  07/01/2020 appt with following noted: Moved appt up. Now says he understated depression level when he was here the last time. Restarted lithium bc felt more depressed without it. Reduced paroxetine to 40 mg daily. Lethargic.  Mind won't calm down.  Nonrestorative sleep but 8 hours.  Doesn't think sleep is deep enough.  Tense. Goes to [employer]. Started counseling. Wants to try something different with meds. Can have mixed sx for 6 weeks with increased interest in things and motivation and want to spend money but still feels depressed.  3 times in the last year. Plan: Increase  lithium back to 3 daily to see if depression is better and less mood cycling.  He admits to feeling worse when he stopped the lithium Continue the reduce paroxetine to 45 mg daily. DC Wellbutrin Lamotrigine trial 25 mg to 100 mg daily over 4 weeks. Continue olanzapine 5 mg nightly it was helpful for depression and anxiety.  08/26/2020  appointment with the following noted: Sad and down.  Classic depression sx and very fatigued.  Unusual to have classic depression bc usually is atypical.  No effect from lamotrigine. Wonders if atorvastatin is causing some confusion or depression bc read about it. Depressed for 3 mos. Gained 20#.  It's leveled off now. Olanzapine helped the rumination and anxiety.   Life is more depressing bc not seeing friends like it was bc stopped drinking.   No SE with lithium.  Unless dropping things. Sleep 1030 to 6 but doesn't sleep deep.  Not rested in AM   Seeing [name] PhD  On disability for 7-8 years.  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 .  Buspar stopped DT little effect. Xanax,  Olanzapine 10  lithium  900  Review of Systems:  Review of Systems  Constitutional: Positive for unexpected weight change.  Cardiovascular: Negative for palpitations.  Neurological: Negative for tremors and weakness.  Psychiatric/Behavioral: Positive for depression and dysphoric mood. Negative for agitation, behavioral problems, confusion, decreased concentration, hallucinations, self-injury, sleep disturbance and suicidal ideas. The patient is nervous/anxious. The patient is not hyperactive.     Medications: I have reviewed the patient's current medications.  Current Outpatient Medications  Medication Sig Dispense Refill  . amLODipine (NORVASC) 5 MG tablet Take daily 90 tablet 3  . atorvastatin (LIPITOR) 40 MG tablet TAKE 1 TABLET(40 MG) BY MOUTH DAILY 90 tablet 3  . Cyanocobalamin (VITAMIN B-12 PO) Take by mouth.    [name] lisinopril (ZESTRIL) 20 MG tablet TAKE 1 TABLET(20 MG) BY MOUTH DAILY 90 tablet 3  . lithium carbonate 300 MG capsule TAKE 3 CAPSULES(900 MG) BY MOUTH AT BEDTIME 270 capsule 0  . PARoxetine (PAXIL) 30 MG tablet Take 2 tablets (60 mg total) by mouth daily.  (Patient taking differently: Take 45 mg by mouth daily.) 180 tablet 1  . lamoTRIgine (LAMICTAL) 150 MG tablet Take 1 tablet (150 mg total) by mouth 4 (four) times daily. 90 tablet 0  . OLANZapine (ZYPREXA) 10 MG tablet Take 1 tablet (10 mg total) by mouth at bedtime. 90 tablet 0   No current facility-administered medications for this visit.    Medication Side Effects: sexual, weight  Allergies: No Known Allergies  Past Medical  History:  Diagnosis Date  . Anxiety   . Bimalleolar fracture of left ankle   . Depression   . Heart murmur   . Sleep apnea    uses CPAP nightly    Family History  Problem Relation Age of Onset  . Cancer Maternal Grandfather   . Colon cancer Neg Hx   . Colon polyps Neg Hx   . Esophageal cancer Neg Hx   . Rectal cancer Neg Hx   . Stomach cancer Neg Hx     Social History   Socioeconomic History  . Marital status: Single    Spouse name: Not on file  . Number of children: Not on file  . Years of education: Not on file  . Highest education level: Not on file  Occupational History  . Not on file  Tobacco Use  . Smoking status: Current Every Day Smoker    Packs/day: 1.50    Years: 0.00    Pack years: 0.00    Types: Cigarettes  . Smokeless tobacco: Never Used  Vaping Use  . Vaping Use: Never used  Substance and Sexual Activity  . Alcohol use: Not Currently  . Drug use: No  . Sexual activity: Not on file  Other Topics Concern  . Not on file  Social History Narrative  . Not on file   Social Determinants of Health   Financial Resource Strain: Low Risk   . Difficulty of Paying Living Expenses: Not very hard  Food Insecurity: No Food Insecurity  . Worried About Charity fundraiser in the Last Year: Never true  . Ran Out of Food in the Last Year: Never true  Transportation Needs: No Transportation Needs  . Lack of Transportation (Medical): No  . Lack of Transportation (Non-Medical): No  Physical Activity: Inactive  . Days of Exercise per  Week: 0 days  . Minutes of Exercise per Session: 0 min  Stress: Stress Concern Present  . Feeling of Stress : To some extent  Social Connections: Unknown  . Frequency of Communication with Friends and Family: Twice a week  . Frequency of Social Gatherings with Friends and Family: Twice a week  . Attends Religious Services: Never  . Active Member of Clubs or Organizations: No  . Attends Archivist Meetings: Never  . Marital Status: Patient refused  Intimate Partner Violence: Not At Risk  . Fear of Current or Ex-Partner: No  . Emotionally Abused: No  . Physically Abused: No  . Sexually Abused: No    Past Medical History, Surgical history, Social history, and Family history were reviewed and updated as appropriate.   Please see review of systems for further details on the patient's review from today.   Objective:   Physical Exam:  There were no vitals taken for this visit.  Physical Exam Constitutional:      General: He is not in acute distress.    Appearance: Normal appearance. He is well-developed.     Comments: Red face  Musculoskeletal:        General: No deformity.  Neurological:     Mental Status: He is alert and oriented to person, place, and time.     Motor: No tremor.     Coordination: Coordination normal.     Gait: Gait normal.  Psychiatric:        Attention and Perception: Attention normal. He is attentive.        Mood and Affect: Mood is anxious and depressed. Affect is not labile,  blunt, angry or inappropriate.        Speech: Speech normal.        Behavior: Behavior normal. Behavior is not agitated.        Thought Content: Thought content normal. Thought content does not include homicidal or suicidal ideation. Thought content does not include homicidal or suicidal plan.        Cognition and Memory: Cognition normal.        Judgment: Judgment normal.     Comments: Insight  Fair. Reports overall depression and anxiety are not as good       Lab  Review:     Component Value Date/Time   NA 136 06/16/2020 1342   K 4.4 06/16/2020 1342   CL 100 06/16/2020 1342   CO2 28 06/16/2020 1342   GLUCOSE 99 06/16/2020 1342   BUN 16 06/16/2020 1342   CREATININE 1.20 06/16/2020 1342   CALCIUM 9.9 06/16/2020 1342   PROT 7.5 06/16/2020 1342   ALBUMIN 4.7 06/16/2020 1342   AST 22 06/16/2020 1342   ALT 34 06/16/2020 1342   ALKPHOS 119 (H) 06/16/2020 1342   BILITOT 0.6 06/16/2020 1342       Component Value Date/Time   WBC 9.9 06/16/2020 1342   RBC 5.08 06/16/2020 1342   HGB 15.7 06/16/2020 1342   HGB 15.2 05/05/2010 1049   HCT 45.2 06/16/2020 1342   HCT 44.8 05/05/2010 1049   PLT 252.0 06/16/2020 1342   PLT 193 05/05/2010 1049   MCV 89.1 06/16/2020 1342   MCV 89.6 05/05/2010 1049   MCH 30.4 05/05/2010 1049   MCHC 34.7 06/16/2020 1342   RDW 13.0 06/16/2020 1342   RDW 13.1 05/05/2010 1049   LYMPHSABS 3.0 06/16/2020 1342   LYMPHSABS 1.8 05/05/2010 1049   MONOABS 0.7 06/16/2020 1342   MONOABS 0.4 05/05/2010 1049   EOSABS 0.3 06/16/2020 1342   EOSABS 0.2 05/05/2010 1049   BASOSABS 0.0 06/16/2020 1342   BASOSABS 0.0 05/05/2010 1049    No results found for: POCLITH, LITHIUM   No results found for: PHENYTOIN, PHENOBARB, VALPROATE, CBMZ   .res Assessment: Plan:     Lovis was seen today for follow-up, major depressive disorder with single episode, in partial r, depression and anxiety.  Diagnoses and all orders for this visit:  Major depressive disorder with single episode, in partial remission (HCC) -     OLANZapine (ZYPREXA) 10 MG tablet; Take 1 tablet (10 mg total) by mouth at bedtime. -     lamoTRIgine (LAMICTAL) 150 MG tablet; Take 1 tablet (150 mg total) by mouth 4 (four) times daily.  Mixed obsessional thoughts and acts  GAD (generalized anxiety disorder) -     OLANZapine (ZYPREXA) 10 MG tablet; Take 1 tablet (10 mg total) by mouth at bedtime.  Alcohol dependence in remission ([place])  OSA (obstructive sleep  apnea)    Patient reports more consistent depression but anxiety is better controlled with olanzapine and paroxetine.  While the patient denies symptoms of grandiosity or euphoria he does describe some other manic cycling symptoms including increased interest, increased goal-directed behaviors, increased urge to spend that will last for 6 weeks or so and then stop.  He has these cycles about 3 times a year.  This is suggestive of a bipolar predisposition but I am not yet changing his diagnosis.   Continue CPAP use. He gets some benefit with it but machine is old and needs to be replaced. When he had better functioning CPAP machine it helped  his alertness, mood, productivity and concentration.   Discussed potential metabolic side effects associated with atypical antipsychotics, as well as potential risk for movement side effects. Advised pt to contact office if movement side effects occur. Disc weight gain risk with olanzapine. He agrees.  Call if you have any problems with this.  He remains sober.  He wants to consider counseling to work through some personal issues as well as address specific symptoms like his anger and irritability.  Increase  lithium back to 3 daily to see if depression is better and less mood cycling.  He admits to feeling worse when he stopped the lithium Counseled patient regarding potential benefits, risks, and side effects of lithium to include potential risk of lithium affecting thyroid and renal function.  Discussed need for periodic lab monitoring to determine drug level and to assess for potential adverse effects.  Counseled patient regarding signs and symptoms of lithium toxicity and advised that they notify office immediately or seek urgent medical attention if experiencing these signs and symptoms.  Patient advised to contact office with any questions or concerns.  Continue the reduce paroxetine to 40 mg daily bc it helped anxiety.  Lamotrigine increase to 150 mg  daily DT no benefit or SE  Increase olanzapine10 mg nightly it was helpful for depression and anxiety.  Need more help with depression  FU 8 weeks  Lynder Parents, MD, DFAPA   Please see After Visit Summary for patient specific instructions.  Future Appointments  Date Time Provider [place]  10/29/2020  4:20 PM Jettie Booze, MD [facility]  12/02/2020  4:00 PM [facility] PHARMACIST [facility]    No orders of the defined types were placed in this encounter.     -------------------------------

## 2020-08-26 NOTE — Patient Instructions (Signed)
Increase lamotrigine to 150 mg daily for depression  Increase olanzapine to 10 mg daily.

## 2020-08-27 NOTE — Telephone Encounter (Cosign Needed)
2nd attempt

## 2020-08-31 NOTE — Telephone Encounter (Cosign Needed)
3rd attempt

## 2020-09-01 ENCOUNTER — Other Ambulatory Visit: Payer: Self-pay | Admitting: Adult Health

## 2020-09-01 DIAGNOSIS — I10 Essential (primary) hypertension: Secondary | ICD-10-CM

## 2020-09-03 ENCOUNTER — Other Ambulatory Visit: Payer: Self-pay | Admitting: Psychiatry

## 2020-09-03 DIAGNOSIS — F411 Generalized anxiety disorder: Secondary | ICD-10-CM

## 2020-09-03 DIAGNOSIS — F331 Major depressive disorder, recurrent, moderate: Secondary | ICD-10-CM

## 2020-09-03 DIAGNOSIS — F324 Major depressive disorder, single episode, in partial remission: Secondary | ICD-10-CM

## 2020-09-04 NOTE — Telephone Encounter (Signed)
Pt reducing to 40 mg. Send in new rx?

## 2020-09-11 ENCOUNTER — Telehealth: Payer: Self-pay | Admitting: Psychiatry

## 2020-09-11 NOTE — Telephone Encounter (Signed)
Patient lm stating he is experiencing a lack of concentration, and very unsteady. Patient is not scheduled for an appointment until 8/30. Please advise.

## 2020-09-11 NOTE — Telephone Encounter (Signed)
Pt stated he is foegetting things throughout the day and very unsteady.He works with tools and equptment and this is a problem.He stated he takes 6 Lamictal in the am.

## 2020-09-14 NOTE — Telephone Encounter (Signed)
We made 3 med changes at the last visit including increasing lamotrigine to 150 mg daily, increasing olanzapine to 10 mg daily, and restarting lithium.  Any 1 of those changes could possibly cause some of the side effects.  We will have to make 1 change at a time to evaluate this.  Therefore reduce lamotrigine to 100 mg daily.  It may take a couple of weeks before he sees a difference.

## 2020-09-14 NOTE — Telephone Encounter (Signed)
Pt informed

## 2020-09-21 DIAGNOSIS — I10 Essential (primary) hypertension: Secondary | ICD-10-CM | POA: Diagnosis not present

## 2020-09-21 DIAGNOSIS — G4733 Obstructive sleep apnea (adult) (pediatric): Secondary | ICD-10-CM | POA: Diagnosis not present

## 2020-09-26 ENCOUNTER — Other Ambulatory Visit: Payer: Self-pay | Admitting: Psychiatry

## 2020-09-26 DIAGNOSIS — F324 Major depressive disorder, single episode, in partial remission: Secondary | ICD-10-CM

## 2020-09-28 NOTE — Telephone Encounter (Signed)
This Rx was sent for 4 x's daily but #90?

## 2020-09-30 ENCOUNTER — Other Ambulatory Visit: Payer: Self-pay | Admitting: Psychiatry

## 2020-09-30 DIAGNOSIS — F324 Major depressive disorder, single episode, in partial remission: Secondary | ICD-10-CM

## 2020-09-30 MED ORDER — LAMOTRIGINE 150 MG PO TABS: 150.0000 mg | ORAL_TABLET | Freq: Every day | ORAL | 0 refills | Status: DC

## 2020-09-30 NOTE — Telephone Encounter (Signed)
The corrected prescription of lamotrigine 150 mg tablets 1 daily #90 was sent.

## 2020-10-01 ENCOUNTER — Telehealth: Payer: Self-pay | Admitting: Pharmacist

## 2020-10-01 NOTE — Chronic Care Management (AMB) (Signed)
Chronic Care Management Pharmacy Assistant   Name: Jonathon Walker  MRN: [id number] DOB: 01/21/1966   Reason for Encounter: Disease State /Hypertension Assessment Call.   Conditions to be addressed/monitored: HTN   Recent office visits:  None  Recent consult visits:  08-26-2020 Purnell Shoemaker. MD [facility]) -Patient was seen for major depressive disorder. No medication changes noted. No follow up date noted.  Hospital visits:  None in previous 6 months  Medications: Outpatient Encounter Medications as of 10/01/2020  Medication Sig   amLODipine (NORVASC) 5 MG tablet Take daily   atorvastatin (LIPITOR) 40 MG tablet TAKE 1 TABLET(40 MG) BY MOUTH DAILY   Cyanocobalamin (VITAMIN B-12 PO) Take by mouth.   lamoTRIgine (LAMICTAL) 150 MG tablet Take 1 tablet (150 mg total) by mouth daily.   lisinopril (ZESTRIL) 20 MG tablet TAKE 1 TABLET(20 MG) BY MOUTH DAILY   lithium carbonate 300 MG capsule TAKE 3 CAPSULES(900 MG) BY MOUTH AT BEDTIME   OLANZapine (ZYPREXA) 10 MG tablet Take 1 tablet (10 mg total) by mouth at bedtime.   PARoxetine (PAXIL) 40 MG tablet Take 1 tablet (40 mg total) by mouth daily.   No facility-administered encounter medications on file as of 10/01/2020.   Fill History: ATORVASTATIN 40MG  TABLETS 08/09/2020 90   LISINOPRIL  20 MG TABS 08/09/2020 90   LITHIUM CARBONATE 300MG  CAPSULES 08/10/2020 90   OLANZAPINE 10MG  TABLETS 08/26/2020 90   PAROXETINE HCL  40 MG TABS 09/07/2020 90   LAMOTRIGINE  150 MG TABS 09/30/2020 90   09/07/2020 90   Reviewed chart prior to disease state call. Spoke with patient regarding BP  Recent Office Vitals: BP Readings from Last 3 Encounters:  06/16/20 140/90  08/30/19 110/62  07/08/19 130/72   Pulse Readings from Last 3 Encounters:  06/16/20 76  08/30/19 (!) 59  07/08/19 69    Wt Readings from Last 3 Encounters:  06/16/20 273 lb 6.4 oz (124 kg)  08/30/19 260 lb (117.9 kg)  08/16/19 260 lb (117.9 kg)      Kidney Function Lab Results  Component Value Date/Time   CREATININE 1.20 06/16/2020 01:42 PM   CREATININE 1.14 06/13/2019 08:20 AM   GFR 68.19 06/16/2020 01:42 PM    BMP Latest Ref Rng & Units 06/16/2020 06/13/2019 05/31/2018  Glucose 70 - 99 mg/dL 99 109(H) 103(H)  BUN 6 - 23 mg/dL 16 19 12   Creatinine 0.40 - 1.50 mg/dL 1.20 1.14 1.14  Sodium 135 - 145 mEq/L 136 139 139  Potassium 3.5 - 5.1 mEq/L 4.4 4.2 4.4  Chloride 96 - 112 mEq/L 100 104 102  CO2 19 - 32 mEq/L 28 28 27   Calcium 8.4 - 10.5 mg/dL 9.9 9.6 9.8    Current antihypertensive regimen:  Lisinopril 20mg  take one tablet daily Amlodipine 5mg  take one tablet daily How often are you checking your Blood Pressure?  Current home BP readings:  What recent interventions/DTPs have been made by any provider to improve Blood Pressure control since last CPP Visit:None Any recent hospitalizations or ED visits since last visit with CPP? No What diet changes have been made to improve Blood Pressure Control?   What exercise is being done to improve your Blood Pressure Control?    Adherence Review: Is the patient currently on ACE/ARB medication? Yes Does the patient have >5 day gap between last estimated fill dates? No  Multiple unsuccessful attempts to reach patient by phone.   Care Gaps: Pneumovax vaccine - Due Zoster vaccine - Due  COVID booster - Due Annual Wellness Visit- DONE 07-28-2020  Star Rating Drugs:  Lisinopril 20mg  - Last filled 08-09-2020 90DS at [facility] Atorvastatin 40mg  - Last filled 08-09-2020 90DS at [facility] Pharmacist Assistant [phone]

## 2020-10-06 NOTE — Telephone Encounter (Cosign Needed)
2nd attempt

## 2020-10-07 NOTE — Telephone Encounter (Cosign Needed)
3rd attempt

## 2020-10-09 ENCOUNTER — Encounter: Payer: Self-pay | Admitting: Adult Health

## 2020-10-09 ENCOUNTER — Other Ambulatory Visit: Payer: Self-pay

## 2020-10-09 ENCOUNTER — Ambulatory Visit (INDEPENDENT_AMBULATORY_CARE_PROVIDER_SITE_OTHER): Payer: Medicare Other | Admitting: Adult Health

## 2020-10-09 VITALS — BP 110/60 | HR 63 | Temp 98.6°F | Ht 75.25 in | Wt 273.0 lb

## 2020-10-09 DIAGNOSIS — Z720 Tobacco use: Secondary | ICD-10-CM

## 2020-10-09 MED ORDER — VARENICLINE TARTRATE 0.5 MG X 11 & 1 MG X 42 PO MISC: ORAL | 0 refills | Status: DC

## 2020-10-09 NOTE — Progress Notes (Signed)
Subjective:    Patient ID: Jonathon Walker, male    DOB: Nov 01, 1965, 55 y.o.   MRN: [id number]  HPI 55 year old male who  has a past medical history of Anxiety, Bimalleolar fracture of left ankle, Depression, Heart murmur, and Sleep apnea.  He presents to the office today for assistance in quitting smoking. He is up to 2 packs a day and would like to quit. He has been able to quit in the past for multiple years.   Has been on Wellbutrin in the past for depression but did not help much with quitting smoking   Review of Systems See HPI   Past Medical History:  Diagnosis Date   Anxiety    Bimalleolar fracture of left ankle    Depression    Heart murmur    Sleep apnea    uses CPAP nightly    Social History   Socioeconomic History   Marital status: Single    Spouse name: Not on file   Number of children: Not on file   Years of education: Not on file   Highest education level: Not on file  Occupational History   Not on file  Tobacco Use   Smoking status: Every Day    Packs/day: 1.50    Years: 0.00    Pack years: 0.00    Types: Cigarettes   Smokeless tobacco: Never  Vaping Use   Vaping Use: Never used  Substance and Sexual Activity   Alcohol use: Not Currently   Drug use: No   Sexual activity: Not on file  Other Topics Concern   Not on file  Social History Narrative   Not on file   Social Determinants of Health   Financial Resource Strain: Low Risk    Difficulty of Paying Living Expenses: Not very hard  Food Insecurity: No Food Insecurity   Worried About Running Out of Food in the Last Year: Never true   Ran Out of Food in the Last Year: Never true  Transportation Needs: No Transportation Needs   Lack of Transportation (Medical): No   Lack of Transportation (Non-Medical): No  Physical Activity: Inactive   Days of Exercise per Week: 0 days   Minutes of Exercise per Session: 0 min  Stress: Stress Concern Present   Feeling of Stress : To some extent   Social Connections: Unknown   Frequency of Communication with Friends and Family: Twice a week   Frequency of Social Gatherings with Friends and Family: Twice a week   Attends Religious Services: Never   Marine scientist or Organizations: No   Attends Music therapist: Never   Marital Status: Patient refused  Human resources officer Violence: Not At Risk   Fear of Current or Ex-Partner: No   Emotionally Abused: No   Physically Abused: No   Sexually Abused: No    Past Surgical History:  Procedure Laterality Date   ORIF ANKLE FRACTURE Left 09/19/2017   Procedure: OPEN REDUCTION INTERNAL FIXATION (ORIF) ANKLE FRACTURE;  Surgeon: Renette Butters, MD;  Location: [facility];  Service: Orthopedics;  Laterality: Left;   TOOTH EXTRACTION      Family History  Problem Relation Age of Onset   Cancer Maternal Grandfather    Colon cancer Neg Hx    Colon polyps Neg Hx    Esophageal cancer Neg Hx    Rectal cancer Neg Hx    Stomach cancer Neg Hx     No Known Allergies  Current Outpatient Medications on File Prior to Visit  Medication Sig Dispense Refill   amLODipine (NORVASC) 5 MG tablet Take daily 90 tablet 3   atorvastatin (LIPITOR) 40 MG tablet TAKE 1 TABLET(40 MG) BY MOUTH DAILY 90 tablet 3   Cyanocobalamin (VITAMIN B-12 PO) Take by mouth.     lamoTRIgine (LAMICTAL) 150 MG tablet Take 1 tablet (150 mg total) by mouth daily. 90 tablet 0   lisinopril (ZESTRIL) 20 MG tablet TAKE 1 TABLET(20 MG) BY MOUTH DAILY 90 tablet 3   lithium carbonate 300 MG capsule TAKE 3 CAPSULES(900 MG) BY MOUTH AT BEDTIME 270 capsule 0   OLANZapine (ZYPREXA) 10 MG tablet Take 1 tablet (10 mg total) by mouth at bedtime. 90 tablet 0   PARoxetine (PAXIL) 40 MG tablet Take 1 tablet (40 mg total) by mouth daily. 90 tablet 0   No current facility-administered medications on file prior to visit.    BP 110/60   Pulse 63   Temp 98.6 F (37 C) (Oral)   Ht 6' 3.25" (1.911 m)   Wt 273 lb  (123.8 kg)   SpO2 93%   BMI 33.90 kg/m      Objective:   Physical Exam Vitals and nursing note reviewed.  Constitutional:      Appearance: Normal appearance.  Cardiovascular:     Rate and Rhythm: Normal rate and regular rhythm.     Pulses: Normal pulses.     Heart sounds: Normal heart sounds.  Musculoskeletal:        General: Normal range of motion.  Skin:    General: Skin is warm and dry.     Capillary Refill: Capillary refill takes less than 2 seconds.  Neurological:     General: No focal deficit present.     Mental Status: He is alert and oriented to person, place, and time.  Psychiatric:        Mood and Affect: Mood normal.        Behavior: Behavior normal.        Thought Content: Thought content normal.        Judgment: Judgment normal.      Assessment & Plan:   1. Tobacco use - Discussed options for quitting smoking. I think Chantix would be the best for him  - varenicline (CHANTIX PAK) 0.5 MG X 11 & 1 MG X 42 tablet; Take one 0.5 mg tablet by mouth once daily for 3 days, then increase to one 0.5 mg tablet twice daily for 4 days, then increase to one 1 mg tablet twice daily.  Dispense: 53 tablet; Refill: 0 - Will have him follow up in one month virtually to see how he is doing   [name], NP

## 2020-10-22 DIAGNOSIS — G4733 Obstructive sleep apnea (adult) (pediatric): Secondary | ICD-10-CM | POA: Diagnosis not present

## 2020-10-22 DIAGNOSIS — I10 Essential (primary) hypertension: Secondary | ICD-10-CM | POA: Diagnosis not present

## 2020-10-29 ENCOUNTER — Other Ambulatory Visit: Payer: Self-pay | Admitting: Psychiatry

## 2020-10-29 ENCOUNTER — Ambulatory Visit: Payer: Medicare Other | Admitting: Interventional Cardiology

## 2020-10-29 DIAGNOSIS — F324 Major depressive disorder, single episode, in partial remission: Secondary | ICD-10-CM

## 2020-11-02 DIAGNOSIS — R55 Syncope and collapse: Secondary | ICD-10-CM | POA: Diagnosis not present

## 2020-11-03 ENCOUNTER — Encounter: Payer: Self-pay | Admitting: Adult Health

## 2020-11-03 ENCOUNTER — Telehealth (INDEPENDENT_AMBULATORY_CARE_PROVIDER_SITE_OTHER): Payer: Medicare Other | Admitting: Adult Health

## 2020-11-03 VITALS — Ht 75.25 in | Wt 273.0 lb

## 2020-11-03 DIAGNOSIS — I951 Orthostatic hypotension: Secondary | ICD-10-CM | POA: Diagnosis not present

## 2020-11-03 NOTE — Progress Notes (Signed)
Virtual Visit via Video Note  I connected with Jonathon Walker on 11/03/20 at 10:30 AM EDT by a video enabled telemedicine application and verified that I am speaking with the correct person using two identifiers.  Location patient: home Location provider:work or home office Persons participating in the virtual visit: patient, provider  I discussed the limitations of evaluation and management by telemedicine and the availability of in person appointments. The patient expressed understanding and agreed to proceed.   HPI: 55 year old male who is being evaluated today for an acute issue of dizziness.  He reports that his symptoms started a few months ago after changes in his blood pressure medication.  Currently prescribed lisinopril 20 mg and Norvasc 2.5 mg.  He does not have a blood pressure cuff at home, but he did monitor his blood pressure when he was out and reports reading of 110/73.  He reports that with changes in position such as getting out of his lazy boy he will feel dizzy and lightheaded for a moment in time.  Resolves.  Has no issues with dizziness or lightheadedness when walking, sitting, or laying down. Tries to drink fluids throughout the day but reports that he is likely not drinking enough. Denies CP/SOB/Syncope   ROS: See pertinent positives and negatives per HPI.  Past Medical History:  Diagnosis Date   Anxiety    Bimalleolar fracture of left ankle    Depression    Heart murmur    Sleep apnea    uses CPAP nightly    Past Surgical History:  Procedure Laterality Date   ORIF ANKLE FRACTURE Left 09/19/2017   Procedure: OPEN REDUCTION INTERNAL FIXATION (ORIF) ANKLE FRACTURE;  Surgeon: Renette Butters, MD;  Location: [facility];  Service: Orthopedics;  Laterality: Left;   TOOTH EXTRACTION      Family History  Problem Relation Age of Onset   Cancer Maternal Grandfather    Colon cancer Neg Hx    Colon polyps Neg Hx    Esophageal cancer Neg Hx    Rectal  cancer Neg Hx    Stomach cancer Neg Hx        Current Outpatient Medications:    amLODipine (NORVASC) 5 MG tablet, Take daily, Disp: 90 tablet, Rfl: 3   atorvastatin (LIPITOR) 40 MG tablet, TAKE 1 TABLET(40 MG) BY MOUTH DAILY, Disp: 90 tablet, Rfl: 3   Cyanocobalamin (VITAMIN B-12 PO), Take by mouth., Disp: , Rfl:    lamoTRIgine (LAMICTAL) 150 MG tablet, Take 1 tablet (150 mg total) by mouth daily., Disp: 90 tablet, Rfl: 0   lisinopril (ZESTRIL) 20 MG tablet, TAKE 1 TABLET(20 MG) BY MOUTH DAILY, Disp: 90 tablet, Rfl: 3   lithium carbonate 300 MG capsule, TAKE 3 CAPSULES(900 MG) BY MOUTH AT BEDTIME, Disp: 270 capsule, Rfl: 0   OLANZapine (ZYPREXA) 10 MG tablet, Take 1 tablet (10 mg total) by mouth at bedtime., Disp: 90 tablet, Rfl: 0   PARoxetine (PAXIL) 40 MG tablet, Take 1 tablet (40 mg total) by mouth daily., Disp: 90 tablet, Rfl: 0  EXAM:  VITALS per patient if applicable:  GENERAL: alert, oriented, appears well and in no acute distress  HEENT: atraumatic, conjunttiva clear, no obvious abnormalities on inspection of external nose and ears  NECK: normal movements of the head and neck  LUNGS: on inspection no signs of respiratory distress, breathing rate appears normal, no obvious gross SOB, gasping or wheezing  CV: no obvious cyanosis  MS: moves all visible extremities without noticeable abnormality  PSYCH/NEURO: pleasant and cooperative, no obvious depression or anxiety, speech and thought processing grossly intact  ASSESSMENT AND PLAN:  Discussed the following assessment and plan:  1. Orthostatic hypotension -We will have him increase his fluids and cut his lisinopril in half.  Encouraged to get a blood pressure cuff and monitor his blood pressure at home, he reports that he will do this.  We will have him follow-up with me via MyChart in 1 week.      I discussed the assessment and treatment plan with the patient. The patient was provided an opportunity to ask  questions and all were answered. The patient agreed with the plan and demonstrated an understanding of the instructions.   The patient was advised to call back or seek an in-person evaluation if the symptoms worsen or if the condition fails to improve as anticipated.   [name], NP

## 2020-11-03 NOTE — Telephone Encounter (Signed)
Please advise 

## 2020-11-04 ENCOUNTER — Encounter: Payer: Self-pay | Admitting: Adult Health

## 2020-11-04 ENCOUNTER — Telehealth: Payer: Self-pay

## 2020-11-04 NOTE — Telephone Encounter (Signed)
Patient returned call if possible call back in 20 mins per patient

## 2020-11-04 NOTE — Telephone Encounter (Signed)
Please advise 

## 2020-11-09 ENCOUNTER — Telehealth: Payer: Self-pay | Admitting: Pharmacist

## 2020-11-09 NOTE — Chronic Care Management (AMB) (Signed)
Chronic Care Management Pharmacy Assistant   Name: Jonathon Walker  MRN: [id number] DOB: [birth year]  Reason for Encounter: Disease State/ Hypertension Assessment Call.   Conditions to be addressed/monitored: HTN   Recent office visits:  11/03/20 [name] NP (PCP) - seen for orthostatic hypotension. Discontinued Varenicline tartrate 0.'5mg'$ . follow up in 1 week via mychart.  10/09/20 [name] NP (PCP) - seen for tobacco use. Patient started on Varenicline tartrate 0.'5mg'$ . follow up in 1 month.  Recent consult visits:  None.  Hospital visits:  None in previous 6 months  Medications: Outpatient Encounter Medications as of 11/09/2020  Medication Sig   amLODipine (NORVASC) 5 MG tablet Take daily   atorvastatin (LIPITOR) 40 MG tablet TAKE 1 TABLET(40 MG) BY MOUTH DAILY   Cyanocobalamin (VITAMIN B-12 PO) Take by mouth.   lamoTRIgine (LAMICTAL) 150 MG tablet Take 1 tablet (150 mg total) by mouth daily.   lisinopril (ZESTRIL) 20 MG tablet TAKE 1 TABLET(20 MG) BY MOUTH DAILY   lithium carbonate 300 MG capsule TAKE 3 CAPSULES(900 MG) BY MOUTH AT BEDTIME   OLANZapine (ZYPREXA) 10 MG tablet Take 1 tablet (10 mg total) by mouth at bedtime.   PARoxetine (PAXIL) 40 MG tablet Take 1 tablet (40 mg total) by mouth daily.   No facility-administered encounter medications on file as of 11/09/2020.   Fill History: ATORVASTATIN CALCIUM  40 MG TABS 10/29/2020 30   LISINOPRIL  20 MG TABS 10/29/2020 30   LITHIUM CARBONATE  300 MG CAPS 10/30/2020 90   OLANZAPINE '10MG'$  TABLETS 08/26/2020 90   PAROXETINE '40MG'$  TABLETS 10/26/2020 90   AMLODIPINE BESYLATE '5MG'$  TABLETS 09/07/2020 90   BUPROPION '75MG'$  TABLETS 08/15/2020 23   LAMOTRIGINE  150 MG TABS 09/30/2020 90   Reviewed chart prior to disease state call. Spoke with patient regarding BP  Recent Office Vitals: BP Readings from Last 3 Encounters:  10/09/20 110/60  06/16/20 140/90  08/30/19 110/62   Pulse Readings from Last 3  Encounters:  10/09/20 63  06/16/20 76  08/30/19 (!) 59    Wt Readings from Last 3 Encounters:  11/03/20 273 lb (123.8 kg)  10/09/20 273 lb (123.8 kg)  06/16/20 273 lb 6.4 oz (124 kg)     Kidney Function Lab Results  Component Value Date/Time   CREATININE 1.20 06/16/2020 01:42 PM   CREATININE 1.14 06/13/2019 08:20 AM   GFR 68.19 06/16/2020 01:42 PM    BMP Latest Ref Rng & Units 06/16/2020 06/13/2019 05/31/2018  Glucose 70 - 99 mg/dL 99 109(H) 103(H)  BUN 6 - 23 mg/dL '16 19 12  '$ Creatinine 0.40 - 1.50 mg/dL 1.20 1.14 1.14  Sodium 135 - 145 mEq/L 136 139 139  Potassium 3.5 - 5.1 mEq/L 4.4 4.2 4.4  Chloride 96 - 112 mEq/L 100 104 102  CO2 19 - 32 mEq/L '28 28 27  '$ Calcium 8.4 - 10.5 mg/dL 9.9 9.6 9.8    Current antihypertensive regimen:  Amlodipine '5mg'$  - take daily Lisinopril '20mg'$  - take 1 tablet by mouth daily How often are you checking your Blood Pressure?  Current home BP readings:  What recent interventions/DTPs have been made by any provider to improve Blood Pressure control since last CPP Visit: None. Any recent hospitalizations or ED visits since last visit with CPP? No What diet changes have been made to improve Blood Pressure Control?   What exercise is being done to improve your Blood Pressure Control?    Adherence Review: Is the patient currently on ACE/ARB medication? Yes  Does the patient have >5 day gap between last estimated fill dates? No  Multiple unsuccessful attempts to reach patient by phone.  Care Gaps:  AWV - completed on 07/28/20 Pneumonia vaccine - never done HIV screening - never done Zoster vaccines - never done Covid-19 vaccine booster 4 - overdue since 06/27/20 Flu vaccine - due  Star Rating Drugs:  Atorvastatin '40mg'$  - last filled on 10/29/20 30DS at [facility] Lisinopril '20mg'$  - last filled on 10/29/20 30DS at [facility] [phone]

## 2020-11-10 NOTE — Telephone Encounter (Cosign Needed)
2nd attempt

## 2020-11-12 NOTE — Telephone Encounter (Cosign Needed)
3rd attempt

## 2020-11-13 ENCOUNTER — Telehealth: Payer: Medicare Other | Admitting: Adult Health

## 2020-11-13 ENCOUNTER — Telehealth: Payer: Self-pay | Admitting: Psychiatry

## 2020-11-13 NOTE — Telephone Encounter (Signed)
Pt informed

## 2020-11-13 NOTE — Telephone Encounter (Signed)
Take the lamotrigine 75 mg for 2 weeks and he should be able to stop then without withdrawal.  If he gets shakey, nervous then we need to reduce more slowly and let us know that.  Otherwise he can stop if he follows these directions.

## 2020-11-13 NOTE — Telephone Encounter (Signed)
Please review

## 2020-11-13 NOTE — Telephone Encounter (Signed)
Patient lm stating he is currently taking Lamotrigine 150 mg. He has decreased it to 75 mg due to side effects per pt ( blurred vision, shaking and coordination issues). Patient stated he is discontinuing before his scheduled October appointment.

## 2020-11-17 ENCOUNTER — Ambulatory Visit: Payer: Medicare Other | Admitting: Psychiatry

## 2020-11-17 ENCOUNTER — Telehealth (INDEPENDENT_AMBULATORY_CARE_PROVIDER_SITE_OTHER): Payer: Medicare Other | Admitting: Adult Health

## 2020-11-17 ENCOUNTER — Encounter: Payer: Self-pay | Admitting: Adult Health

## 2020-11-17 DIAGNOSIS — I951 Orthostatic hypotension: Secondary | ICD-10-CM

## 2020-11-17 NOTE — Progress Notes (Signed)
Virtual Visit via Video Note  I connected with Jonathon Walker on 11/17/20 at  4:30 PM EDT by a video enabled telemedicine application and verified that I am speaking with the correct person using two identifiers.  Location patient: home Location provider:work or home office Persons participating in the virtual visit: patient, provider  I discussed the limitations of evaluation and management by telemedicine and the availability of in person appointments. The patient expressed understanding and agreed to proceed.   HPI: 55 year old male who is being evaluated today for follow-up regarding hypotension.  Most recently lisinopril was decreased from 20 mg to 10 mg due to symptomatic hypotension with dizziness.  He continued on Norvasc 5 mg.  He reports that his blood pressures at home since decreasing lisinopril about 3 weeks ago have been between 112-128/58-65 with most of them being in the low 110s.  He does report that the dizziness has decreased about 50% but he still having dizzy spells especially when getting out of a chair or out of bed very quickly.      ROS: See pertinent positives and negatives per HPI.  Past Medical History:  Diagnosis Date   Anxiety    Bimalleolar fracture of left ankle    Depression    Heart murmur    Sleep apnea    uses CPAP nightly    Past Surgical History:  Procedure Laterality Date   ORIF ANKLE FRACTURE Left 09/19/2017   Procedure: OPEN REDUCTION INTERNAL FIXATION (ORIF) ANKLE FRACTURE;  Surgeon: Renette Butters, MD;  Location: [facility];  Service: Orthopedics;  Laterality: Left;   TOOTH EXTRACTION      Family History  Problem Relation Age of Onset   Cancer Maternal Grandfather    Colon cancer Neg Hx    Colon polyps Neg Hx    Esophageal cancer Neg Hx    Rectal cancer Neg Hx    Stomach cancer Neg Hx        Current Outpatient Medications:    amLODipine (NORVASC) 5 MG tablet, Take daily, Disp: 90 tablet, Rfl: 3    atorvastatin (LIPITOR) 40 MG tablet, TAKE 1 TABLET(40 MG) BY MOUTH DAILY, Disp: 90 tablet, Rfl: 3   Cyanocobalamin (VITAMIN B-12 PO), Take by mouth., Disp: , Rfl:    lisinopril (ZESTRIL) 20 MG tablet, TAKE 1 TABLET(20 MG) BY MOUTH DAILY, Disp: 90 tablet, Rfl: 3   lithium carbonate 300 MG capsule, TAKE 3 CAPSULES(900 MG) BY MOUTH AT BEDTIME, Disp: 270 capsule, Rfl: 0   OLANZapine (ZYPREXA) 10 MG tablet, Take 1 tablet (10 mg total) by mouth at bedtime., Disp: 90 tablet, Rfl: 0   PARoxetine (PAXIL) 40 MG tablet, Take 1 tablet (40 mg total) by mouth daily., Disp: 90 tablet, Rfl: 0  EXAM:  VITALS per patient if applicable:  GENERAL: alert, oriented, appears well and in no acute distress  HEENT: atraumatic, conjunttiva clear, no obvious abnormalities on inspection of external nose and ears  NECK: normal movements of the head and neck  LUNGS: on inspection no signs of respiratory distress, breathing rate appears normal, no obvious gross SOB, gasping or wheezing  CV: no obvious cyanosis  MS: moves all visible extremities without noticeable abnormality  PSYCH/NEURO: pleasant and cooperative, no obvious depression or anxiety, speech and thought processing grossly intact  ASSESSMENT AND PLAN:  Discussed the following assessment and plan:  1. Orthostatic hypotension -We will have him discontinue lisinopril completely.  Continue with Norvasc 5 mg.  He understands that if he has constant  elevations in blood pressure in the 130s to 140s son to let me know.  Otherwise continue to monitor blood pressure at home and follow-up in 3 weeks.      I discussed the assessment and treatment plan with the patient. The patient was provided an opportunity to ask questions and all were answered. The patient agreed with the plan and demonstrated an understanding of the instructions.   The patient was advised to call back or seek an in-person evaluation if the symptoms worsen or if the condition fails to  improve as anticipated.   [name], NP

## 2020-11-22 DIAGNOSIS — G4733 Obstructive sleep apnea (adult) (pediatric): Secondary | ICD-10-CM | POA: Diagnosis not present

## 2020-11-22 DIAGNOSIS — I10 Essential (primary) hypertension: Secondary | ICD-10-CM | POA: Diagnosis not present

## 2020-11-30 ENCOUNTER — Telehealth: Payer: Self-pay | Admitting: Psychiatry

## 2020-11-30 NOTE — Telephone Encounter (Signed)
Reduce olanzapine to one half of the 10 mg tablet at night for 4 weeks and then stop it.  If he gets more depressed then get back in touch with [place] and we will try to do something about the weight gain that olanzapine can cause by using the alternative Lybalvi

## 2020-11-30 NOTE — Telephone Encounter (Signed)
LVM with info

## 2020-11-30 NOTE — Telephone Encounter (Signed)
Please review

## 2020-11-30 NOTE — Telephone Encounter (Signed)
Pt called and advised he would like to taper off of the Olanzapine.  He has gained 8# since his last visit and 30# total since he started taking the med.  He feels steady, not so much stress now, and would like to see if he can take Lorazepam instead.  Then talk to Dr. Clovis Pu in Oct when they meet.

## 2020-12-01 ENCOUNTER — Telehealth: Payer: Self-pay | Admitting: Pharmacist

## 2020-12-01 NOTE — Chronic Care Management (AMB) (Signed)
    Chronic Care Management Pharmacy Assistant   Name: Jonathon Walker  MRN: [id number] DOB: Aug 11, 1965  12/02/20 APPOINTMENT REMINDER   Called [name], No answer, left message of appointment on 12/02/20 at 4pm via telephone visit with [name], Pharm D. Notified to have all medications, supplements, blood pressure and/or blood sugar logs available during appointment and to return call if need to reschedule.  Care Gaps:  AWV - completed on 07/28/20 Pneumonia vaccine - never done HIV screening - never done Zoster vaccines - never done Covid-19 vaccine booster 4 - overdue since 06/27/20 Flu vaccine - due  Star Rating Drug:  Atorvastatin '40mg'$  - last filled on 11/02/20 30DS at [facility] Lisinopril '20mg'$  - last filled on 11/02/20 [facility]  Any gaps in medications fill history? NoHoover Browns CMA  Clinical Pharmacist Assistant [phone]

## 2020-12-02 ENCOUNTER — Other Ambulatory Visit: Payer: Self-pay | Admitting: Psychiatry

## 2020-12-02 ENCOUNTER — Telehealth: Payer: Medicare Other

## 2020-12-02 DIAGNOSIS — F324 Major depressive disorder, single episode, in partial remission: Secondary | ICD-10-CM

## 2020-12-07 ENCOUNTER — Encounter: Payer: Self-pay | Admitting: Psychiatry

## 2020-12-07 ENCOUNTER — Other Ambulatory Visit: Payer: Self-pay

## 2020-12-07 ENCOUNTER — Ambulatory Visit: Payer: Medicare Other | Admitting: Psychiatry

## 2020-12-07 DIAGNOSIS — F1021 Alcohol dependence, in remission: Secondary | ICD-10-CM

## 2020-12-07 DIAGNOSIS — E538 Deficiency of other specified B group vitamins: Secondary | ICD-10-CM | POA: Diagnosis not present

## 2020-12-07 DIAGNOSIS — F319 Bipolar disorder, unspecified: Secondary | ICD-10-CM | POA: Diagnosis not present

## 2020-12-07 DIAGNOSIS — R7989 Other specified abnormal findings of blood chemistry: Secondary | ICD-10-CM | POA: Diagnosis not present

## 2020-12-07 DIAGNOSIS — F324 Major depressive disorder, single episode, in partial remission: Secondary | ICD-10-CM

## 2020-12-07 DIAGNOSIS — F411 Generalized anxiety disorder: Secondary | ICD-10-CM

## 2020-12-07 DIAGNOSIS — G4733 Obstructive sleep apnea (adult) (pediatric): Secondary | ICD-10-CM

## 2020-12-07 DIAGNOSIS — F422 Mixed obsessional thoughts and acts: Secondary | ICD-10-CM

## 2020-12-07 MED ORDER — MODAFINIL 200 MG PO TABS: ORAL_TABLET | ORAL | 1 refills | Status: DC

## 2020-12-07 NOTE — Progress Notes (Signed)
Jonathon Walker [id number] 1965-11-03 55 y.o.  Subjective:   Patient ID:  Jonathon Walker is a 55 y.o. (DOB 09-23-1965) male.  Chief Complaint:  Chief Complaint  Patient presents with   Follow-up   Depression   Anxiety   Medication Problem   Fatigue    Depression        Associated symptoms include fatigue.  Associated symptoms include no decreased concentration and no suicidal ideas.  Past medical history includes anxiety.   Anxiety Symptoms include nervous/anxious behavior. Patient reports no confusion, decreased concentration, palpitations or suicidal ideas.      Jonathon Walker presents to the office today for follow-up of anxiety , depression, alcohol.    seen August 7.  His anxiety was unmanaged at Paxil 40 mg and olanzapine 5 mg so olanzapine was increased to 7.5 daily.  seen December 17, 2018.  His anxiety was unmanaged.  The following change was made: Trial 1-1/2 of the 7.5 mg tablets of olanzapine for anxiety.  He is had a stay at [facility] for alcohol dependence since he was last here. Stayed 28 days and DC before Thanksgiving.  Very helpful.  Was having NM nighly before and they stopped since then.  Not drinking.  Involved in AA since then.  seen March 25, 2019.  The patient was doing better requested a reduction in olanzapine to 7.5 mg nightly to improve energy.  visit April 03, 2019.  He had remained sober and his anxiety was improved with sobriety.  He was having problems with low motivation and forgetfulness.  At the next refill it was decided olanzapine would be reduced from 7.5 to 5 mg daily. He called requesting this urgent appointment because of worsening symptoms associated with returning to work.  seen May 21, 2019.  He was doing relatively well.  The following was noted: Tolerating Wellbutrin and it seems to help. He didn't tolerate the increase. Anxiety is improved off alcohol.  Ok to reduce olanzapine to 2.5 mg daily for a month and stop it.   Hopefully low motivation will improve.   He called back March 23 stating he was more depressed and having a harder time doing routine tasks and having suicidal thoughts.  Because of worsening depression after reducing the olanzapine he was encouraged to increase olanzapine back to 5 mg nightly. He called again March 24 stating he was really struggling but was scheduled for an appointment on March 26.  As of June 14, 2019 he reports the following: Every day a real hard struggle to get through the days.  More anxious and depressed equally. Worse 2 weeks.  Last Saturday so overwhelmed by how he feels he had SI.  Everything is hard. Initial benefit paroxetine has been lost.  Awakens stressed and tired. Adequate hours of sleep.  Will be major chore just to mow grass.  Still sober. SE sexual. Getting appt with CPAP doc. Changes made include: Increasing olanzapine back to 5 mg daily a couple of days prior to the appointment due to phone call and the addition of lithium 300 mg for a few days then increase to 600 mg nightly both to augment the antidepressant and because of suicidal thoughts.  July 15, 2019 appointment, the following is noted: Doing OK with both depression and anxiety.  Kind of like a top always going in mind.  Even when I sleep, when awakens mind still going.  NM nightly for years but less than before stopped drinking.  Smaller and less  severe ones that don't wake him.  Theme can't finish things.  Both depression and anxiety 3/10.  More productive.  Paces himself.  Can live with it.  Sleep 9 hours.  Initially olanzapine stopped the ruminating but it's back some.  Overall still benefit.  No SI and can't rmember why he had them before. Residual energy not great and some ruminating. He had some concerns about sexual side effects from Paxil but agreed that no med changes were necessary despite residual symptoms of depression and anxiety.  08/14/2019 appointment, the following is noted:  He  scheduled urgently. Vaccinated. Angry easily and exhausted and everything is a chore.  Exhausted. Never had appt with CPAP company.  CPAP is 56 years old and on same settings as in the past.  CPAP machine is not working properly but when it did it was helpful for alertness and energy.  Disc need to call them to have it evaluated. To bed 10-7.  Don't feel rested in morning but used to feel rested when first started paroxetine. Plan: Increase Lithium continue to 900 mg daily for irritability  11/19/19 appt with the following noted: He increased to 900 mg and then reduced it but doesn't know why.  Reduced it to 300 nightly. Irritability much better.  Once anger since here with rage but not outward. Still on paroxetine 40, olanzapine 5, Wellbutrin 75 AM. Getting tired in afternoon and worrying about getting depressed but he's not depressed now.  Don't enjoy things like he used to.  Les interest and excitement. Appetite and sleep are normal.   Still adjusting without alcohol and socialization. Worrying about lack of sex drive with paroxetine. Can still ruminate on things until he gets some reassurance through talking with people. Plan: Continue low-dose lithium 300 mg daily Continue Paxil.  Did not feel well at 60 mg so we will continue 40 mg.  Likely to lose anxiety benefit if change it. Continue low-dose Wellbutrin 75  Tolerating Wellbutrin and it seems to help. He didn't tolerate the increase.   Continue olanzapine 5 mg  02/18/2020 appointment with following noted: Open to trying increase paroxetine again bc ruminating wears him out and gets fatigued and it wears him out though is 80% better vs before paroxetine. Doesn't remember trying higher paroxetine.  Ruminates on relationships or work.  Whenever he can talk about it with someone it helps tremendously but also realizes paroxetine helps. Anxiety affects dreams and NM too.  Up and down a little.  Anxiety drove depression before paroxetine.   Best med he's tried. SOBER 1 YEAR SE no problem.  Except gained 25# in 4 years.  No change in diet and exercise.  No increase in appetite and no food cravings.  Seeing PCP soon. Plan: Increase paroxetine trial to 60 for rumination.  Disc SE.  05/26/20 appt noted: No benefit increase paroxetine nor SE.  Wants to reduce back to 30 mg paroxetine and stop lithium bc it didn't seem to help.   4 days of Calm app has seemed to help rumination.  Does it in afternoon and before bed and it seemed to helps.  Doing a 10 min meditation and it helps.   Dep 4/10.  Anxiety 4/10. And a lot better the last few days.   Plan: Reduce lithium by 1 tablet per week.  Call if there is any suicidal thought. Starting April 1 reduce paroxetine to 40 mg daily. Continue low-dose Wellbutrin 75  Tolerating Wellbutrin and it seems to help. He didn't  tolerate the increase.   If doing OK will try taper as he suggested at next visit. Continue olanzapine 5 mg nightly it was helpful for depression and anxiety.  07/01/2020 appt with following noted: Moved appt up. Now says he understated depression level when he was here the last time. Restarted lithium bc felt more depressed without it. Reduced paroxetine to 40 mg daily. Lethargic.  Mind won't calm down.  Nonrestorative sleep but 8 hours.  Doesn't think sleep is deep enough.  Tense. Goes to [employer]. Started counseling. Wants to try something different with meds. Can have mixed sx for 6 weeks with increased interest in things and motivation and want to spend money but still feels depressed.  3 times in the last year. Plan: Increase  lithium back to 3 daily to see if depression is better and less mood cycling.  He admits to feeling worse when he stopped the lithium Continue the reduce paroxetine to 45 mg daily. DC Wellbutrin Lamotrigine trial 25 mg to 100 mg daily over 4 weeks. Continue olanzapine 5 mg nightly it was helpful for depression and anxiety.  08/26/2020  appointment with the following noted: Sad and down.  Classic depression sx and very fatigued.  Unusual to have classic depression bc usually is atypical.  No effect from lamotrigine. Wonders if atorvastatin is causing some confusion or depression bc read about it. Depressed for 3 mos. Gained 20#.  It's leveled off now. Olanzapine helped the rumination and anxiety.   Life is more depressing bc not seeing friends like it was bc stopped drinking.   No SE with lithium.  Unless dropping things. Sleep 1030 to 6 but doesn't sleep deep.  Not rested in AM Plan: Increase  lithium back to 3 daily to see if depression is better and less mood cycling.  He admits to feeling worse when he stopped the lithium Continue the reduce paroxetine to 40 mg daily bc it helped anxiety. Lamotrigine increase to 150 mg daily DT no benefit or SE Increase olanzapine10 mg nightly it was helpful for depression and anxiety.  Need more help with depression   09/11/2020 phone call: Pt stated he is foegetting things throughout the day and very unsteady.He works with tools and equptment and this is a problem.He stated he takes 6 Lamictal in the am. MD response: We made 3 med changes at the last visit including increasing lamotrigine to 150 mg daily, increasing olanzapine to 10 mg daily, and restarting lithium.  Any 1 of those changes could possibly cause some of the side effects.  We will have to make 1 change at a time to evaluate this.  Therefore reduce lamotrigine to 100 mg daily.  It may take a couple of weeks before he sees a difference.  11/13/2020 phone call: Patient lm stating he is currently taking Lamotrigine 150 mg. He has decreased it to 75 mg due to side effects per pt ( blurred vision, shaking and coordination issues). Patient stated he is discontinuing before his scheduled October appointment. MD response: Take the lamotrigine 75 mg for 2 weeks and he should be able to stop then without withdrawal.  If he gets shakey,  nervous then we need to reduce more slowly and let us know that.  Otherwise he can stop if he follows these directions.  11/30/2020 phone call: Pt called and advised he would like to taper off of the Olanzapine.  He has gained 8# since his last visit and 30# total since he started taking  the med.  He feels steady, not so much stress now, and would like to see if he can take Lorazepam instead.  Then talk to Dr. Clovis Pu in Oct when they meet. MD response: Reduce olanzapine to one half of the 10 mg tablet at night for 4 weeks and then stop it.  If he gets more depressed then get back in touch with [place] and we will try to do something about the weight gain that olanzapine can cause by using the alternative Lybalvi  12/07/2020 appointment with the following noted: Off lamotrigine .  Reduced olanzapine to 5 mg for a week.  On lithium 900 mg HS, paroxetine 40 Fatigue 10/10.  Dizziness resolved off lamotrigine.  Low fatigue and motivation.  "I want to want to do things".  Stopped drinking almost 18 mos and socially big adjustment.   Still invloved with AA and daily sponsor.  Working [employer].  [employer] helped. Thinks mild depression with little interest and motivation and no joy.  But not severe.  No SI. No anxiety and it's way better.  Life is more stable and helps.  Working 4 hours daily and occ extrea jobs. Uses new CPAP.  Stil drowsy and fatigue.  Seeing [name] PhD  On disability for 7-8 years.  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 .  Buspar stopped DT little effect. Xanax,  Olanzapine 10  lithium  900  Review of Systems:  Review of Systems  Constitutional:  Positive for fatigue and unexpected weight change.  Cardiovascular:  Negative for palpitations.  Neurological:  Negative for tremors and weakness.  Psychiatric/Behavioral:  Positive for dysphoric mood. Negative  for agitation, behavioral problems, confusion, decreased concentration, hallucinations, self-injury, sleep disturbance and suicidal ideas. The patient is nervous/anxious. The patient is not hyperactive.    Medications: I have reviewed the patient's current medications.  Current Outpatient Medications  Medication Sig Dispense Refill   amLODipine (NORVASC) 5 MG tablet Take daily 90 tablet 3   atorvastatin (LIPITOR) 40 MG tablet TAKE 1 TABLET(40 MG) BY MOUTH DAILY 90 tablet 3   Cyanocobalamin (VITAMIN B-12 PO) Take by mouth.     lithium carbonate 300 MG capsule TAKE 3 CAPSULES(900 MG) BY MOUTH AT BEDTIME 270 capsule 0   OLANZapine (ZYPREXA) 10 MG tablet Take 1 tablet (10 mg total) by mouth at bedtime. (Patient taking differently: Take 5 mg by mouth at bedtime.) 90 tablet 0   PARoxetine (PAXIL) 40 MG tablet Take 1 tablet (40 mg total) by mouth daily. 90 tablet 0   lisinopril (ZESTRIL) 20 MG tablet TAKE 1 TABLET(20 MG) BY MOUTH DAILY (Patient not taking: Reported on 12/07/2020) 90 tablet 3   modafinil (PROVIGIL) 200 MG tablet 1/2 tablet each morning for 1 week and then 1 tablet each morning 30 tablet 1   No current facility-administered medications for this visit.    Medication Side Effects: sexual, weight  Allergies: No Known Allergies  Past Medical History:  Diagnosis Date   Anxiety    Bimalleolar fracture of left ankle    Depression    Heart murmur    Sleep apnea    uses CPAP nightly    Family History  Problem Relation Age of Onset   Cancer Maternal Grandfather    Colon cancer Neg Hx    Colon polyps Neg Hx    Esophageal cancer Neg Hx  Rectal cancer Neg Hx    Stomach cancer Neg Hx     Social History   Socioeconomic History   Marital status: Single    Spouse name: Not on file   Number of children: Not on file   Years of education: Not on file   Highest education level: Not on file  Occupational History   Not on file  Tobacco Use   Smoking status: Every Day     Packs/day: 1.50    Years: 0.00    Pack years: 0.00    Types: Cigarettes   Smokeless tobacco: Never  Vaping Use   Vaping Use: Never used  Substance and Sexual Activity   Alcohol use: Not Currently   Drug use: No   Sexual activity: Not on file  Other Topics Concern   Not on file  Social History Narrative   Not on file   Social Determinants of Health   Financial Resource Strain: Low Risk    Difficulty of Paying Living Expenses: Not very hard  Food Insecurity: No Food Insecurity   Worried About Running Out of Food in the Last Year: Never true   Ran Out of Food in the Last Year: Never true  Transportation Needs: No Transportation Needs   Lack of Transportation (Medical): No   Lack of Transportation (Non-Medical): No  Physical Activity: Inactive   Days of Exercise per Week: 0 days   Minutes of Exercise per Session: 0 min  Stress: Stress Concern Present   Feeling of Stress : To some extent  Social Connections: Unknown   Frequency of Communication with Friends and Family: Twice a week   Frequency of Social Gatherings with Friends and Family: Twice a week   Attends Religious Services: Never   Marine scientist or Organizations: No   Attends Music therapist: Never   Marital Status: Patient refused  Human resources officer Violence: Not At Risk   Fear of Current or Ex-Partner: No   Emotionally Abused: No   Physically Abused: No   Sexually Abused: No    Past Medical History, Surgical history, Social history, and Family history were reviewed and updated as appropriate.   Please see review of systems for further details on the patient's review from today.   Objective:   Physical Exam:  There were no vitals taken for this visit.  Physical Exam Constitutional:      General: He is not in acute distress.    Appearance: Normal appearance. He is well-developed.     Comments: Red face  Musculoskeletal:        General: No deformity.  Neurological:     Mental  Status: He is alert and oriented to person, place, and time.     Motor: No tremor.     Coordination: Coordination normal.     Gait: Gait normal.  Psychiatric:        Attention and Perception: Attention normal. He is attentive.        Mood and Affect: Mood is depressed. Mood is not anxious. Affect is not labile, blunt, angry or inappropriate.        Speech: Speech normal.        Behavior: Behavior normal. Behavior is not agitated.        Thought Content: Thought content normal. Thought content does not include homicidal or suicidal ideation. Thought content does not include homicidal or suicidal plan.        Cognition and Memory: Cognition normal.  Judgment: Judgment normal.     Comments: Insight  Fair. Depression more of a problem than anxiety     Lab Review:     Component Value Date/Time   NA 136 06/16/2020 1342   K 4.4 06/16/2020 1342   CL 100 06/16/2020 1342   CO2 28 06/16/2020 1342   GLUCOSE 99 06/16/2020 1342   BUN 16 06/16/2020 1342   CREATININE 1.20 06/16/2020 1342   CALCIUM 9.9 06/16/2020 1342   PROT 7.5 06/16/2020 1342   ALBUMIN 4.7 06/16/2020 1342   AST 22 06/16/2020 1342   ALT 34 06/16/2020 1342   ALKPHOS 119 (H) 06/16/2020 1342   BILITOT 0.6 06/16/2020 1342       Component Value Date/Time   WBC 9.9 06/16/2020 1342   RBC 5.08 06/16/2020 1342   HGB 15.7 06/16/2020 1342   HGB 15.2 05/05/2010 1049   HCT 45.2 06/16/2020 1342   HCT 44.8 05/05/2010 1049   PLT 252.0 06/16/2020 1342   PLT 193 05/05/2010 1049   MCV 89.1 06/16/2020 1342   MCV 89.6 05/05/2010 1049   MCH 30.4 05/05/2010 1049   MCHC 34.7 06/16/2020 1342   RDW 13.0 06/16/2020 1342   RDW 13.1 05/05/2010 1049   LYMPHSABS 3.0 06/16/2020 1342   LYMPHSABS 1.8 05/05/2010 1049   MONOABS 0.7 06/16/2020 1342   MONOABS 0.4 05/05/2010 1049   EOSABS 0.3 06/16/2020 1342   EOSABS 0.2 05/05/2010 1049   BASOSABS 0.0 06/16/2020 1342   BASOSABS 0.0 05/05/2010 1049    No results found for: POCLITH,  LITHIUM   No results found for: PHENYTOIN, PHENOBARB, VALPROATE, CBMZ   .res Assessment: Plan:     [place] was seen today for follow-up, depression, anxiety, medication problem and fatigue.  Diagnoses and all orders for this visit:  Major depressive disorder with single episode, in partial remission ([place])  Bipolar I disorder (HCC) -     Lithium level  Low serum vitamin B12 -     Vitamin B12  Low vitamin D level -     VITAMIN D 25 Hydroxy (Vit-D Deficiency, Fractures)  Mixed obsessional thoughts and acts  GAD (generalized anxiety disorder)  Alcohol dependence in remission (HCC)  OSA (obstructive sleep apnea) -     modafinil (PROVIGIL) 200 MG tablet; 1/2 tablet each morning for 1 week and then 1 tablet each morning   Patient reports more consistent depression but anxiety is better controlled with olanzapine and paroxetine.  While the patient denies symptoms of grandiosity or euphoria he does describe some other manic cycling symptoms including increased interest, increased goal-directed behaviors, increased urge to spend that will last for 6 weeks or so and then stop.  He has these cycles about 3 times a year.  This is suggestive of a bipolar predisposition but I am not yet changing his diagnosis.   Continue CPAP use. New CPAP machine. Trial modafinil 100-200 mg AM  Discussed potential metabolic side effects associated with atypical antipsychotics, as well as potential risk for movement side effects. Advised pt to contact office if movement side effects occur. Disc weight gain risk with olanzapine. He agrees.  Call if you have any problems with this.  He remains sober.  He wants to consider counseling to work through some personal issues as well as address specific symptoms like his anger and irritability.  Continue lithium 900 mg daily to see if depression is better and less mood cycling.  He admits to feeling worse when he stopped the lithium Counseled patient regarding  potential benefits, risks, and side effects of lithium to include potential risk of lithium affecting thyroid and renal function.  Discussed need for periodic lab monitoring to determine drug level and to assess for potential adverse effects.  Counseled patient regarding signs and symptoms of lithium toxicity and advised that they notify office immediately or seek urgent medical attention if experiencing these signs and symptoms.  Patient advised to contact office with any questions or concerns.  Continue the reduced paroxetine to 40 mg daily bc it helped anxiety.  Lamotrigine off DT NR  Reduced olanzapine to 5 mg daily for a week and well stop in 3 weeks DT wt gain and fatigue  FU 8 weeks  Lynder Parents, MD, DFAPA   Please see After Visit Summary for patient specific instructions.  Future Appointments  Date Time Provider [place]  01/13/2021  2:00 PM Cottle, Billey Co., MD [facility] None  03/04/2021  8:00 AM Jettie Booze, MD [facility]    Orders Placed This Encounter  Procedures   VITAMIN D 25 Hydroxy (Vit-D Deficiency, Fractures)   Lithium level   Vitamin B12       -------------------------------

## 2020-12-07 NOTE — Patient Instructions (Addendum)
N-Acetylcysteine (NAC)  600 mg daily to help with mild cognitive problems.

## 2020-12-21 ENCOUNTER — Telehealth: Payer: Self-pay

## 2020-12-21 NOTE — Telephone Encounter (Signed)
Prior Authorization submitted and approved for MODAFINIL 200 MG #30 effective 12/21/2020-06/21/2021, PA# [id number] with [employer] Medicare ID# [id number]

## 2020-12-22 DIAGNOSIS — G4733 Obstructive sleep apnea (adult) (pediatric): Secondary | ICD-10-CM | POA: Diagnosis not present

## 2020-12-22 DIAGNOSIS — I10 Essential (primary) hypertension: Secondary | ICD-10-CM | POA: Diagnosis not present

## 2021-01-07 ENCOUNTER — Telehealth: Payer: Self-pay | Admitting: Psychiatry

## 2021-01-07 ENCOUNTER — Encounter: Payer: Self-pay | Admitting: Adult Health

## 2021-01-07 ENCOUNTER — Telehealth (INDEPENDENT_AMBULATORY_CARE_PROVIDER_SITE_OTHER): Payer: Medicare Other | Admitting: Adult Health

## 2021-01-07 VITALS — Ht 75.5 in | Wt 273.0 lb

## 2021-01-07 DIAGNOSIS — G4733 Obstructive sleep apnea (adult) (pediatric): Secondary | ICD-10-CM

## 2021-01-07 NOTE — Telephone Encounter (Signed)
Called patient. It was modafinil he wanted filled. Order written 12/07/20 for #30 x1, so it doesn't look like he is due for a refill yet. A PA has been done.

## 2021-01-07 NOTE — Progress Notes (Signed)
Virtual Visit via Telephone Note  I connected with Jonathon Walker on 01/07/21 at  4:00 PM EDT by telephone and verified that I am speaking with the correct person using two identifiers.   I discussed the limitations, risks, security and privacy concerns of performing an evaluation and management service by telephone and the availability of in person appointments. I also discussed with the patient that there may be a patient responsible charge related to this service. The patient expressed understanding and agreed to proceed.  Location patient: home Location provider: work or home office Participants present for the call: patient, provider Patient did not have a visit in the prior 7 days to address this/these issue(s).   History of Present Illness: 55 year old male who has a history of sleep apnea.  Believes that he is not getting benefit from his CPAP, he constantly feels fatigued during the day.  Reports that his CPAP is roughly 82 to 56 years old and that he pretty much "treated it in" at a company that has since gone out of business.  Has not been seen by pulmonary or had a sleep study in 15 years or so.   Observations/Objective: Patient sounds cheerful and well on the phone. I do not appreciate any SOB. Speech and thought processing are grossly intact. Patient reported vitals:  Assessment and Plan: 1. Obstructive sleep apnea syndrome - Ambulatory referral to Pulmonology   Follow Up Instructions:   I did not refer this patient for an OV in the next 24 hours for this/these issue(s).  I discussed the assessment and treatment plan with the patient. The patient was provided an opportunity to ask questions and all were answered. The patient agreed with the plan and demonstrated an understanding of the instructions.   The patient was advised to call back or seek an in-person evaluation if the symptoms worsen or if the condition fails to improve as anticipated.  I provided 8 minutes of  non-face-to-face time during this encounter.   [name], NP

## 2021-01-07 NOTE — Telephone Encounter (Signed)
Please call pt and find out if concerta 27 mg is the med he wanted refilled.I do not see this on his med list

## 2021-01-07 NOTE — Telephone Encounter (Signed)
Next visit is 02/15/21.

## 2021-01-13 ENCOUNTER — Ambulatory Visit: Payer: Medicare Other | Admitting: Psychiatry

## 2021-01-18 ENCOUNTER — Ambulatory Visit: Payer: Medicare Other | Admitting: Psychiatry

## 2021-01-20 ENCOUNTER — Telehealth: Payer: Self-pay | Admitting: Psychiatry

## 2021-01-20 NOTE — Telephone Encounter (Signed)
It is likely the modafinil that is causing this problem.  But it may be corrected with a dose reduction.  I assume he is taking a whole tablet.  If that is the case time to reduce to 1/2 tablet in the morning.  If he is taking half a tablet tell him to reduce it to a quarter of a tablet.  And I agree we need to try to get him in as soon as possible.

## 2021-01-20 NOTE — Telephone Encounter (Signed)
Patient Jonathon Walker called stating he is experiencing some difficulties. Patient's issues include agitation, very tired ,and just not feeling like himself. He would like to speak with someone to maybe adjust his medication. This has been going on for a couple of weeks. He was placed on the wait list for an earlier appointment for he is not scheduled until 11/23. Please advise # [phone].

## 2021-01-21 NOTE — Telephone Encounter (Signed)
LVM for patient to [facility].

## 2021-01-21 NOTE — Telephone Encounter (Signed)
Reviewed

## 2021-01-21 NOTE — Telephone Encounter (Signed)
Patient returned call. He is taking 1 tablet. Told him to reduce to 1/2 tablet per instructions from Dr. Clovis Pu. He expressed understanding.

## 2021-01-22 DIAGNOSIS — G4733 Obstructive sleep apnea (adult) (pediatric): Secondary | ICD-10-CM | POA: Diagnosis not present

## 2021-01-22 DIAGNOSIS — I10 Essential (primary) hypertension: Secondary | ICD-10-CM | POA: Diagnosis not present

## 2021-01-29 ENCOUNTER — Other Ambulatory Visit: Payer: Self-pay | Admitting: Psychiatry

## 2021-01-29 DIAGNOSIS — F324 Major depressive disorder, single episode, in partial remission: Secondary | ICD-10-CM

## 2021-01-29 DIAGNOSIS — F411 Generalized anxiety disorder: Secondary | ICD-10-CM

## 2021-02-02 ENCOUNTER — Telehealth: Payer: Self-pay | Admitting: Pharmacist

## 2021-02-02 DIAGNOSIS — R7989 Other specified abnormal findings of blood chemistry: Secondary | ICD-10-CM | POA: Diagnosis not present

## 2021-02-02 DIAGNOSIS — E538 Deficiency of other specified B group vitamins: Secondary | ICD-10-CM | POA: Diagnosis not present

## 2021-02-02 NOTE — Chronic Care Management (AMB) (Signed)
Chronic Care Management Pharmacy Assistant   Name: Jonathon Walker  MRN: [id number] DOB: [birth year]  Reason for Encounter: Disease State / Hypertension Assessment Call   Conditions to be addressed/monitored: HTN  Recent office visits:  01/07/2021 [name] NP (PCP) - Patient was seen for Obstructive sleep apnea syndrome. Referral to Pulmonology.  No medication changes. No follow up noted.  Recent consult visits:  12/07/2020 Lynder Parents MD (psychiatry) - Patient was seen for major depressive disorder and additional issues. Started Modafinil 200 mg daily. Follow up in 2 months.  Hospital visits:  None  Medications: Outpatient Encounter Medications as of 02/02/2021  Medication Sig   amLODipine (NORVASC) 5 MG tablet Take daily   atorvastatin (LIPITOR) 40 MG tablet TAKE 1 TABLET(40 MG) BY MOUTH DAILY   Cyanocobalamin (VITAMIN B-12 PO) Take by mouth.   lisinopril (ZESTRIL) 20 MG tablet TAKE 1 TABLET(20 MG) BY MOUTH DAILY   lithium carbonate 300 MG capsule TAKE 3 CAPSULES(900 MG) BY MOUTH AT BEDTIME   modafinil (PROVIGIL) 200 MG tablet 1/2 tablet each morning for 1 week and then 1 tablet each morning   OLANZapine (ZYPREXA) 10 MG tablet Take 1 tablet (10 mg total) by mouth at bedtime. (Patient not taking: Reported on 01/07/2021)   PARoxetine (PAXIL) 40 MG tablet TAKE 1 TABLET(40 MG) BY MOUTH DAILY   No facility-administered encounter medications on file as of 02/02/2021.   Fill History: ATORVASTATIN 40MG  TABLETS 11/02/2020 30   LISINOPRIL 20MG  TABLETS 11/02/2020 30   LITHIUM CARBONATE 300MG  CAPSULES 10/30/2020 90   MODAFINIL 200MG  TABLETS 01/07/2021 30   PAROXETINE 40MG  TABLETS 01/29/2021 30   AMLODIPINE BESYLATE 5MG  TABLETS 09/07/2020 90   Reviewed chart prior to disease state call. Spoke with patient regarding BP  Recent Office Vitals: BP Readings from Last 3 Encounters:  10/09/20 110/60  06/16/20 140/90  08/30/19 110/62   Pulse Readings from Last 3  Encounters:  10/09/20 63  06/16/20 76  08/30/19 (!) 59    Wt Readings from Last 3 Encounters:  01/07/21 273 lb (123.8 kg)  11/03/20 273 lb (123.8 kg)  10/09/20 273 lb (123.8 kg)     Kidney Function Lab Results  Component Value Date/Time   CREATININE 1.20 06/16/2020 01:42 PM   CREATININE 1.14 06/13/2019 08:20 AM   GFR 68.19 06/16/2020 01:42 PM    BMP Latest Ref Rng & Units 06/16/2020 06/13/2019 05/31/2018  Glucose 70 - 99 mg/dL 99 109(H) 103(H)  BUN 6 - 23 mg/dL 16 19 12   Creatinine 0.40 - 1.50 mg/dL 1.20 1.14 1.14  Sodium 135 - 145 mEq/L 136 139 139  Potassium 3.5 - 5.1 mEq/L 4.4 4.2 4.4  Chloride 96 - 112 mEq/L 100 104 102  CO2 19 - 32 mEq/L 28 28 27   Calcium 8.4 - 10.5 mg/dL 9.9 9.6 9.8   Current antihypertensive regimen:  Amlodipine 5 mg daily Lisinopril 20 mg daily  How often are you checking your Blood Pressure?   Current home BP readings:   What recent interventions/DTPs have been made by any provider to improve Blood Pressure control since last CPP Visit: None  Any recent hospitalizations or ED visits since last visit with CPP? No  What diet changes have been made to improve Blood Pressure Control?    What exercise is being done to improve your Blood Pressure Control?    Adherence Review: Is the patient currently on ACE/ARB medication? Yes Does the patient have >5 day gap between last estimated fill dates? No  Unable  to reach patient after several attempts.  Care Gaps: AWV - completed on 07/28/20 Last BP -  HIV screening - never done Zoster vaccines - never done Covid-19 vaccine - overdue Flu vaccine - due   Star Rating Drugs: Atorvastatin 40mg  - last filled on 11/02/20 30DS at [facility] Lisinopril 20mg  - last filled on 11/02/20 30DS at [facility] [phone]

## 2021-02-03 LAB — VITAMIN D 25 HYDROXY (VIT D DEFICIENCY, FRACTURES): Vit D, 25-Hydroxy: 52 ng/mL (ref 30–100)

## 2021-02-03 LAB — LITHIUM LEVEL: Lithium Lvl: 0.6 mmol/L (ref 0.6–1.2)

## 2021-02-03 LAB — VITAMIN B12: Vitamin B-12: 541 pg/mL (ref 200–1100)

## 2021-02-09 ENCOUNTER — Ambulatory Visit (INDEPENDENT_AMBULATORY_CARE_PROVIDER_SITE_OTHER): Payer: Medicare Other | Admitting: Pulmonary Disease

## 2021-02-09 ENCOUNTER — Encounter: Payer: Self-pay | Admitting: Pulmonary Disease

## 2021-02-09 ENCOUNTER — Other Ambulatory Visit: Payer: Self-pay

## 2021-02-09 VITALS — BP 118/72 | HR 85 | Temp 97.0°F | Ht 75.0 in | Wt 273.0 lb

## 2021-02-09 DIAGNOSIS — Z9989 Dependence on other enabling machines and devices: Secondary | ICD-10-CM

## 2021-02-09 DIAGNOSIS — G4733 Obstructive sleep apnea (adult) (pediatric): Secondary | ICD-10-CM

## 2021-02-09 NOTE — Progress Notes (Signed)
Jonathon Walker    [id number]    [birth year]  Primary Care Physician:Nafziger, Tommi Rumps, NP  Referring Physician: [name], NP [place],  [place] [address]  Chief complaint:   Patient with a history of obstructive sleep apnea  HPI:  Was having some increased sleepiness, fatigue, excessive tiredness during the day recently Symptoms are improving   Diagnosed with obstructive sleep apnea a few years ago Uses CPAP nightly  He recently acquired his current machine about a year ago to his primary care provider -He is not sure what DME company he uses  Symptoms are currently improving without any other changes  He does not recall any of his medications have not changed recently  He has generalized anxiety/depression On multiple medications that may contribute to daytime sleepiness  Usually goes to bed about 10, may take him about half an hour to fall asleep Several awakenings Final wake up time about 7 AM  Weight is up about 15 pounds  Is very tired during the day but not able to sleep during the day He sleeps fairly well at night but not continuously and this has been his norm  He has hypertension, cardiac rhythm problems  He smokes about 2 packs a day   Outpatient Encounter Medications as of 02/09/2021  Medication Sig   amLODipine (NORVASC) 5 MG tablet Take daily   atorvastatin (LIPITOR) 40 MG tablet TAKE 1 TABLET(40 MG) BY MOUTH DAILY   Cyanocobalamin (VITAMIN B-12 PO) Take by mouth.   lisinopril (ZESTRIL) 20 MG tablet TAKE 1 TABLET(20 MG) BY MOUTH DAILY   lithium carbonate 300 MG capsule TAKE 3 CAPSULES(900 MG) BY MOUTH AT BEDTIME   modafinil (PROVIGIL) 200 MG tablet 1/2 tablet each morning for 1 week and then 1 tablet each morning   OLANZapine (ZYPREXA) 10 MG tablet Take 1 tablet (10 mg total) by mouth at bedtime.   PARoxetine (PAXIL) 40 MG tablet TAKE 1 TABLET(40 MG) BY MOUTH DAILY   No facility-administered encounter medications  on file as of 02/09/2021.    Allergies as of 02/09/2021   (No Known Allergies)    Past Medical History:  Diagnosis Date   Anxiety    Bimalleolar fracture of left ankle    Depression    Heart murmur    Sleep apnea    uses CPAP nightly    Past Surgical History:  Procedure Laterality Date   ORIF ANKLE FRACTURE Left 09/19/2017   Procedure: OPEN REDUCTION INTERNAL FIXATION (ORIF) ANKLE FRACTURE;  Surgeon: Renette Butters, MD;  Location: [facility];  Service: Orthopedics;  Laterality: Left;   TOOTH EXTRACTION      Family History  Problem Relation Age of Onset   Cancer Maternal Grandfather    Colon cancer Neg Hx    Colon polyps Neg Hx    Esophageal cancer Neg Hx    Rectal cancer Neg Hx    Stomach cancer Neg Hx     Social History   Socioeconomic History   Marital status: Single    Spouse name: Not on file   Number of children: Not on file   Years of education: Not on file   Highest education level: Not on file  Occupational History   Not on file  Tobacco Use   Smoking status: Every Day    Packs/day: 1.50    Years: 0.00    Pack years: 0.00    Types: Cigarettes    Start  date: 03/22/1975   Smokeless tobacco: Never   Tobacco comments:    He reports he has quit 3 times and recently started back in 2012. Hsm   Vaping Use   Vaping Use: Never used  Substance and Sexual Activity   Alcohol use: Not Currently   Drug use: No   Sexual activity: Not on file  Other Topics Concern   Not on file  Social History Narrative   Not on file   Social Determinants of Health   Financial Resource Strain: Low Risk    Difficulty of Paying Living Expenses: Not very hard  Food Insecurity: No Food Insecurity   Worried About Running Out of Food in the Last Year: Never true   Ran Out of Food in the Last Year: Never true  Transportation Needs: No Transportation Needs   Lack of Transportation (Medical): No   Lack of Transportation (Non-Medical): No  Physical Activity:  Inactive   Days of Exercise per Week: 0 days   Minutes of Exercise per Session: 0 min  Stress: Stress Concern Present   Feeling of Stress : To some extent  Social Connections: Unknown   Frequency of Communication with Friends and Family: Twice a week   Frequency of Social Gatherings with Friends and Family: Twice a week   Attends Religious Services: Never   Marine scientist or Organizations: No   Attends Music therapist: Never   Marital Status: Patient refused  Human resources officer Violence: Not At Risk   Fear of Current or Ex-Partner: No   Emotionally Abused: No   Physically Abused: No   Sexually Abused: No    Review of Systems  Respiratory:  Positive for apnea. Negative for shortness of breath.   Psychiatric/Behavioral:  Positive for sleep disturbance.    Vitals:   02/09/21 1537  BP: 118/72  Pulse: 85  Temp: (!) 97 F (36.1 C)  SpO2: 97%     Physical Exam Constitutional:      Appearance: He is obese.  HENT:     Head: Normocephalic.     Mouth/Throat:     Mouth: Mucous membranes are moist.     Comments: Crowded oropharynx, Mallampati 3, macroglossia Eyes:     Pupils: Pupils are equal, round, and reactive to light.  Cardiovascular:     Rate and Rhythm: Normal rate and regular rhythm.     Heart sounds: No murmur heard.   No friction rub.  Pulmonary:     Effort: No respiratory distress.     Breath sounds: No stridor. No wheezing or rhonchi.  Musculoskeletal:     Cervical back: No rigidity or tenderness.  Neurological:     Mental Status: He is alert.  Psychiatric:        Mood and Affect: Mood normal.   Results of the Epworth flowsheet 02/09/2021  Sitting and reading 1  Watching TV 1  Sitting, inactive in a public place (e.g. a theatre or a meeting) 0  As a passenger in a car for an hour without a break 0  Lying down to rest in the afternoon when circumstances permit 1  Sitting and talking to someone 0  Sitting quietly after a lunch without  alcohol 0  In a car, while stopped for a few minutes in traffic 0  Total score 3     Data Reviewed: Compliance data not available  Assessment:  Obstructive sleep apnea -Patient is compliant with CPAP use  Generalized anxiety/depression -No significant changes in medications recently  Symptoms of daytime sleepiness and fatigue are generally better  His concern is whether his machine is working well No changes have been made to his machine recently and symptoms appear to be improving  Plan/Recommendations: We did call a couple of DME companies that serviced the patient's account in the past but not recently  We will try and get more information through the primary provider's office  I also encouraged the patient to give [place] a call with any phone numbers that he may have been given when the machine was delivered  I did review his previous sleep study from 2014 showing moderate obstructive sleep apnea, titrated to a pressure of 17  Most recent documentation on record is study was on a pressure of 14  We will not make any changes to his CPAP at present until we are able to get some download from the [facility] DME they will try and send [place] a download  Will tentatively give him an appointment for about 4 weeks   Sherrilyn Rist MD  Pulmonary and Critical Care 02/09/2021, 3:56 PM  CC: [name], NP

## 2021-02-09 NOTE — Patient Instructions (Signed)
We will try and get some information from your primary provider's office regarding medical supply company and machine settings  If you find any information on you and we will respect to the medical supply company that may be able to give [place] some information  Tentatively we will see you back in 4 weeks  Continue using CPAP at current settings  Call with significant concerns

## 2021-02-10 ENCOUNTER — Ambulatory Visit: Payer: Medicare Other | Admitting: Psychiatry

## 2021-02-10 ENCOUNTER — Encounter: Payer: Self-pay | Admitting: Psychiatry

## 2021-02-10 DIAGNOSIS — G4733 Obstructive sleep apnea (adult) (pediatric): Secondary | ICD-10-CM | POA: Diagnosis not present

## 2021-02-10 DIAGNOSIS — F324 Major depressive disorder, single episode, in partial remission: Secondary | ICD-10-CM | POA: Diagnosis not present

## 2021-02-10 DIAGNOSIS — F422 Mixed obsessional thoughts and acts: Secondary | ICD-10-CM | POA: Diagnosis not present

## 2021-02-10 DIAGNOSIS — F1021 Alcohol dependence, in remission: Secondary | ICD-10-CM

## 2021-02-10 DIAGNOSIS — F411 Generalized anxiety disorder: Secondary | ICD-10-CM

## 2021-02-10 MED ORDER — PAROXETINE HCL 40 MG PO TABS: 40.0000 mg | ORAL_TABLET | Freq: Every day | ORAL | 0 refills | Status: DC

## 2021-02-10 MED ORDER — MODAFINIL 200 MG PO TABS: 200.0000 mg | ORAL_TABLET | Freq: Every day | ORAL | 1 refills | Status: DC

## 2021-02-10 MED ORDER — LITHIUM CARBONATE 300 MG PO CAPS: 1200.0000 mg | ORAL_CAPSULE | Freq: Every evening | ORAL | 0 refills | Status: DC

## 2021-02-10 NOTE — Patient Instructions (Signed)
Increase NAC to 2 capsules daily Increase lithium to 4 capsules daily.

## 2021-02-10 NOTE — Progress Notes (Signed)
Jonathon Walker [id number] November 24, 1965 55 y.o.  Subjective:   Patient ID:  Jonathon Walker is a 55 y.o. (DOB Jun 05, 1965) male.  Chief Complaint:  Chief Complaint  Patient presents with   Follow-up   Depression   Anxiety    Depression        Associated symptoms include fatigue.  Associated symptoms include no decreased concentration and no suicidal ideas.  Past medical history includes anxiety.   Anxiety Symptoms include nervous/anxious behavior. Patient reports no confusion, decreased concentration, palpitations or suicidal ideas.      Jonathon Walker presents to the office today for follow-up of anxiety , depression, alcohol.    seen August 7.  His anxiety was unmanaged at Paxil 40 mg and olanzapine 5 mg so olanzapine was increased to 7.5 daily.  seen December 17, 2018.  His anxiety was unmanaged.  The following change was made: Trial 1-1/2 of the 7.5 mg tablets of olanzapine for anxiety.  He is had a stay at [facility] for alcohol dependence since he was last here. Stayed 28 days and DC before Thanksgiving.  Very helpful.  Was having NM nighly before and they stopped since then.  Not drinking.  Involved in AA since then.  seen March 25, 2019.  The patient was doing better requested a reduction in olanzapine to 7.5 mg nightly to improve energy.  visit April 03, 2019.  He had remained sober and his anxiety was improved with sobriety.  He was having problems with low motivation and forgetfulness.  At the next refill it was decided olanzapine would be reduced from 7.5 to 5 mg daily. He called requesting this urgent appointment because of worsening symptoms associated with returning to work.  seen May 21, 2019.  He was doing relatively well.  The following was noted: Tolerating Wellbutrin and it seems to help. He didn't tolerate the increase. Anxiety is improved off alcohol.  Ok to reduce olanzapine to 2.5 mg daily for a month and stop it.  Hopefully low motivation will  improve.   He called back March 23 stating he was more depressed and having a harder time doing routine tasks and having suicidal thoughts.  Because of worsening depression after reducing the olanzapine he was encouraged to increase olanzapine back to 5 mg nightly. He called again March 24 stating he was really struggling but was scheduled for an appointment on March 26.  As of June 14, 2019 he reports the following: Every day a real hard struggle to get through the days.  More anxious and depressed equally. Worse 2 weeks.  Last Saturday so overwhelmed by how he feels he had SI.  Everything is hard. Initial benefit paroxetine has been lost.  Awakens stressed and tired. Adequate hours of sleep.  Will be major chore just to mow grass.  Still sober. SE sexual. Getting appt with CPAP doc. Changes made include: Increasing olanzapine back to 5 mg daily a couple of days prior to the appointment due to phone call and the addition of lithium 300 mg for a few days then increase to 600 mg nightly both to augment the antidepressant and because of suicidal thoughts.  July 15, 2019 appointment, the following is noted: Doing OK with both depression and anxiety.  Kind of like a top always going in mind.  Even when I sleep, when awakens mind still going.  NM nightly for years but less than before stopped drinking.  Smaller and less severe ones that don't wake him.  Theme can't finish things.  Both depression and anxiety 3/10.  More productive.  Paces himself.  Can live with it.  Sleep 9 hours.  Initially olanzapine stopped the ruminating but it's back some.  Overall still benefit.  No SI and can't rmember why he had them before. Residual energy not great and some ruminating. He had some concerns about sexual side effects from Paxil but agreed that no med changes were necessary despite residual symptoms of depression and anxiety.  08/14/2019 appointment, the following is noted:  He scheduled urgently. Vaccinated.  Angry easily and exhausted and everything is a chore.  Exhausted. Never had appt with CPAP company.  CPAP is 56 years old and on same settings as in the past.  CPAP machine is not working properly but when it did it was helpful for alertness and energy.  Disc need to call them to have it evaluated. To bed 10-7.  Don't feel rested in morning but used to feel rested when first started paroxetine. Plan: Increase Lithium continue to 900 mg daily for irritability  11/19/19 appt with the following noted: He increased to 900 mg and then reduced it but doesn't know why.  Reduced it to 300 nightly. Irritability much better.  Once anger since here with rage but not outward. Still on paroxetine 40, olanzapine 5, Wellbutrin 75 AM. Getting tired in afternoon and worrying about getting depressed but he's not depressed now.  Don't enjoy things like he used to.  Les interest and excitement. Appetite and sleep are normal.   Still adjusting without alcohol and socialization. Worrying about lack of sex drive with paroxetine. Can still ruminate on things until he gets some reassurance through talking with people. Plan: Continue low-dose lithium 300 mg daily Continue Paxil.  Did not feel well at 60 mg so we will continue 40 mg.  Likely to lose anxiety benefit if change it. Continue low-dose Wellbutrin 75  Tolerating Wellbutrin and it seems to help. He didn't tolerate the increase.   Continue olanzapine 5 mg  02/18/2020 appointment with following noted: Open to trying increase paroxetine again bc ruminating wears him out and gets fatigued and it wears him out though is 80% better vs before paroxetine. Doesn't remember trying higher paroxetine.  Ruminates on relationships or work.  Whenever he can talk about it with someone it helps tremendously but also realizes paroxetine helps. Anxiety affects dreams and NM too.  Up and down a little.  Anxiety drove depression before paroxetine.  Best med he's tried. SOBER 1  YEAR SE no problem.  Except gained 25# in 4 years.  No change in diet and exercise.  No increase in appetite and no food cravings.  Seeing PCP soon. Plan: Increase paroxetine trial to 60 for rumination.  Disc SE.  05/26/20 appt noted: No benefit increase paroxetine nor SE.  Wants to reduce back to 30 mg paroxetine and stop lithium bc it didn't seem to help.   4 days of Calm app has seemed to help rumination.  Does it in afternoon and before bed and it seemed to helps.  Doing a 10 min meditation and it helps.   Dep 4/10.  Anxiety 4/10. And a lot better the last few days.   Plan: Reduce lithium by 1 tablet per week.  Call if there is any suicidal thought. Starting April 1 reduce paroxetine to 40 mg daily. Continue low-dose Wellbutrin 75  Tolerating Wellbutrin and it seems to help. He didn't tolerate the increase.   If doing  OK will try taper as he suggested at next visit. Continue olanzapine 5 mg nightly it was helpful for depression and anxiety.  07/01/2020 appt with following noted: Moved appt up. Now says he understated depression level when he was here the last time. Restarted lithium bc felt more depressed without it. Reduced paroxetine to 40 mg daily. Lethargic.  Mind won't calm down.  Nonrestorative sleep but 8 hours.  Doesn't think sleep is deep enough.  Tense. Goes to [employer]. Started counseling. Wants to try something different with meds. Can have mixed sx for 6 weeks with increased interest in things and motivation and want to spend money but still feels depressed.  3 times in the last year. Plan: Increase  lithium back to 3 daily to see if depression is better and less mood cycling.  He admits to feeling worse when he stopped the lithium Continue the reduce paroxetine to 45 mg daily. DC Wellbutrin Lamotrigine trial 25 mg to 100 mg daily over 4 weeks. Continue olanzapine 5 mg nightly it was helpful for depression and anxiety.  08/26/2020 appointment with the following  noted: Sad and down.  Classic depression sx and very fatigued.  Unusual to have classic depression bc usually is atypical.  No effect from lamotrigine. Wonders if atorvastatin is causing some confusion or depression bc read about it. Depressed for 3 mos. Gained 20#.  It's leveled off now. Olanzapine helped the rumination and anxiety.   Life is more depressing bc not seeing friends like it was bc stopped drinking.   No SE with lithium.  Unless dropping things. Sleep 1030 to 6 but doesn't sleep deep.  Not rested in AM Plan: Increase  lithium back to 3 daily to see if depression is better and less mood cycling.  He admits to feeling worse when he stopped the lithium Continue the reduce paroxetine to 40 mg daily bc it helped anxiety. Lamotrigine increase to 150 mg daily DT no benefit or SE Increase olanzapine10 mg nightly it was helpful for depression and anxiety.  Need more help with depression   09/11/2020 phone call: Pt stated he is foegetting things throughout the day and very unsteady.He works with tools and equptment and this is a problem.He stated he takes 6 Lamictal in the am. MD response: We made 3 med changes at the last visit including increasing lamotrigine to 150 mg daily, increasing olanzapine to 10 mg daily, and restarting lithium.  Any 1 of those changes could possibly cause some of the side effects.  We will have to make 1 change at a time to evaluate this.  Therefore reduce lamotrigine to 100 mg daily.  It may take a couple of weeks before he sees a difference.  11/13/2020 phone call: Patient lm stating he is currently taking Lamotrigine 150 mg. He has decreased it to 75 mg due to side effects per pt ( blurred vision, shaking and coordination issues). Patient stated he is discontinuing before his scheduled October appointment. MD response: Take the lamotrigine 75 mg for 2 weeks and he should be able to stop then without withdrawal.  If he gets shakey, nervous then we need to reduce more  slowly and let us know that.  Otherwise he can stop if he follows these directions.  11/30/2020 phone call: Pt called and advised he would like to taper off of the Olanzapine.  He has gained 8# since his last visit and 30# total since he started taking the med.  He feels steady, not  so much stress now, and would like to see if he can take Lorazepam instead.  Then talk to Dr. Clovis Pu in Oct when they meet. MD response: Reduce olanzapine to one half of the 10 mg tablet at night for 4 weeks and then stop it.  If he gets more depressed then get back in touch with [place] and we will try to do something about the weight gain that olanzapine can cause by using the alternative Lybalvi  12/07/2020 appointment with the following noted: Off lamotrigine .  Reduced olanzapine to 5 mg for a week.  On lithium 900 mg HS, paroxetine 40 Fatigue 10/10.  Dizziness resolved off lamotrigine.  Low fatigue and motivation.  "I want to want to do things".  Stopped drinking almost 18 mos and socially big adjustment.   Still invloved with AA and daily sponsor.  Working [employer].  Jonathon Walker helped. Thinks mild depression with little interest and motivation and no joy.  But not severe.  No SI. No anxiety and it's way better.  Life is more stable and helps.  Working 4 hours daily and occ extrea jobs. Uses new CPAP.  Stil drowsy and fatigue. Plan: Continue CPAP use. New CPAP machine. Trial modafinil 100-200 mg AM Lamotrigine off DT NR Reduced olanzapine to 5 mg daily for a week and well stop in 3 weeks DT wt gain and fatigue  01/20/2021 phone call: I called patient to see if I could get better information. He states he started modafinil a couple of weeks ago, though Rx was written for in September. He states he didn't feel well before taking the modafinil though. He just says he is agitated, irritable, and unstable feeling. When asked what he meant by unstable he stated feeling a "shell of myself".  He has been put on the cancellation  list.  MD response: It is likely the modafinil that is causing this problem.  But it may be corrected with a dose reduction.  I assume he is taking a whole tablet.  If that is the case time to reduce to 1/2 tablet in the morning.  If he is taking half a tablet tell him to reduce it to a quarter of a tablet.  And I agree we need to try to get him in as soon as possible. He reduced modafinil from 200 mg to 100 mg daily.  02/10/2021 appointment with the following noted: Current psych meds lithium 900 mg nightly, paroxetine 40 mg daily, olanzapine recently stopped, modafinil recently tried. Was really bad for awhile feeling depresssed and agitated and anxiety but better now including after reducing modafinil to 100 mg daily. Fatigue is better and in the middle now with energy, not good but not bad. Sleep is better.  CPAP doctor yesterday and they are gathering info pending. NAC is helping with memory. Still sober and committed to it. SE constipation   Seeing [name] PhD  On disability for 7-8 years.  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  Sober 2 year 01/2019  Past psych meds:  Paxil 60 "too much",  duloxetine, Wellbutrin 75 (max tolerated),  Zoloft, Viibryd 60 NR Abilify, Rexulti. .  Buspar stopped DT little effect. Xanax,  Olanzapine 10 fatige lithium  900 Lamotrigine NR NAC 600 helped Modafinil 200 agitated  Review of Systems:  Review of Systems  Constitutional:  Positive for fatigue and unexpected weight change.  Cardiovascular:  Negative for palpitations.  Gastrointestinal:  Positive for constipation.  Neurological:  Negative for tremors  and weakness.  Psychiatric/Behavioral:  Positive for dysphoric mood. Negative for agitation, behavioral problems, confusion, decreased concentration, hallucinations, self-injury, sleep disturbance and suicidal ideas. The patient is nervous/anxious. The patient is not hyperactive.    Medications: I have reviewed the patient's current  medications.  Current Outpatient Medications  Medication Sig Dispense Refill   amLODipine (NORVASC) 5 MG tablet Take daily 90 tablet 3   atorvastatin (LIPITOR) 40 MG tablet TAKE 1 TABLET(40 MG) BY MOUTH DAILY 90 tablet 3   Cyanocobalamin (VITAMIN B-12 PO) Take by mouth.     lisinopril (ZESTRIL) 20 MG tablet TAKE 1 TABLET(20 MG) BY MOUTH DAILY 90 tablet 3   lithium carbonate 300 MG capsule Take 4 capsules (1,200 mg total) by mouth at bedtime. 360 capsule 0   modafinil (PROVIGIL) 200 MG tablet Take 1 tablet (200 mg total) by mouth daily. 1/2 tablet each morning for 1 week and then 1 tablet each morning 30 tablet 1   PARoxetine (PAXIL) 40 MG tablet Take 1 tablet (40 mg total) by mouth daily at 12 noon. 90 tablet 0   No current facility-administered medications for this visit.    Medication Side Effects: sexual, weight  Allergies: No Known Allergies  Past Medical History:  Diagnosis Date   Anxiety    Bimalleolar fracture of left ankle    Depression    Heart murmur    Sleep apnea    uses CPAP nightly    Family History  Problem Relation Age of Onset   Cancer Maternal Grandfather    Colon cancer Neg Hx    Colon polyps Neg Hx    Esophageal cancer Neg Hx    Rectal cancer Neg Hx    Stomach cancer Neg Hx     Social History   Socioeconomic History   Marital status: Single    Spouse name: Not on file   Number of children: Not on file   Years of education: Not on file   Highest education level: Not on file  Occupational History   Not on file  Tobacco Use   Smoking status: Every Day    Packs/day: 1.50    Years: 0.00    Pack years: 0.00    Types: Cigarettes    Start date: 03/22/1975   Smokeless tobacco: Never   Tobacco comments:    He reports he has quit 3 times and recently started back in 2012. Hsm   Vaping Use   Vaping Use: Never used  Substance and Sexual Activity   Alcohol use: Not Currently   Drug use: No   Sexual activity: Not on file  Other Topics Concern   Not  on file  Social History Narrative   Not on file   Social Determinants of Health   Financial Resource Strain: Low Risk    Difficulty of Paying Living Expenses: Not very hard  Food Insecurity: No Food Insecurity   Worried About Running Out of Food in the Last Year: Never true   Ran Out of Food in the Last Year: Never true  Transportation Needs: No Transportation Needs   Lack of Transportation (Medical): No   Lack of Transportation (Non-Medical): No  Physical Activity: Inactive   Days of Exercise per Week: 0 days   Minutes of Exercise per Session: 0 min  Stress: Stress Concern Present   Feeling of Stress : To some extent  Social Connections: Unknown   Frequency of Communication with Friends and Family: Twice a week   Frequency of Social Gatherings with  Friends and Family: Twice a week   Attends Religious Services: Never   Marine scientist or Organizations: No   Attends Music therapist: Never   Marital Status: Patient refused  Human resources officer Violence: Not At Risk   Fear of Current or Ex-Partner: No   Emotionally Abused: No   Physically Abused: No   Sexually Abused: No    Past Medical History, Surgical history, Social history, and Family history were reviewed and updated as appropriate.   Please see review of systems for further details on the patient's review from today.   Objective:   Physical Exam:  There were no vitals taken for this visit.  Physical Exam Constitutional:      General: He is not in acute distress.    Appearance: Normal appearance. He is well-developed.     Comments: Red face  Musculoskeletal:        General: No deformity.  Neurological:     Mental Status: He is alert and oriented to person, place, and time.     Motor: No tremor.     Coordination: Coordination normal.     Gait: Gait normal.  Psychiatric:        Attention and Perception: Attention normal. He is attentive.        Mood and Affect: Mood is anxious and depressed.  Affect is not labile, blunt, angry or inappropriate.        Speech: Speech normal.        Behavior: Behavior normal. Behavior is not agitated.        Thought Content: Thought content normal. Thought content does not include homicidal or suicidal ideation. Thought content does not include homicidal or suicidal plan.        Cognition and Memory: Cognition normal.        Judgment: Judgment normal.     Comments: Insight  Fair. Depression and  anxiety are mild to moderate     Lab Review:     Component Value Date/Time   NA 136 06/16/2020 1342   K 4.4 06/16/2020 1342   CL 100 06/16/2020 1342   CO2 28 06/16/2020 1342   GLUCOSE 99 06/16/2020 1342   BUN 16 06/16/2020 1342   CREATININE 1.20 06/16/2020 1342   CALCIUM 9.9 06/16/2020 1342   PROT 7.5 06/16/2020 1342   ALBUMIN 4.7 06/16/2020 1342   AST 22 06/16/2020 1342   ALT 34 06/16/2020 1342   ALKPHOS 119 (H) 06/16/2020 1342   BILITOT 0.6 06/16/2020 1342       Component Value Date/Time   WBC 9.9 06/16/2020 1342   RBC 5.08 06/16/2020 1342   HGB 15.7 06/16/2020 1342   HGB 15.2 05/05/2010 1049   HCT 45.2 06/16/2020 1342   HCT 44.8 05/05/2010 1049   PLT 252.0 06/16/2020 1342   PLT 193 05/05/2010 1049   MCV 89.1 06/16/2020 1342   MCV 89.6 05/05/2010 1049   MCH 30.4 05/05/2010 1049   MCHC 34.7 06/16/2020 1342   RDW 13.0 06/16/2020 1342   RDW 13.1 05/05/2010 1049   LYMPHSABS 3.0 06/16/2020 1342   LYMPHSABS 1.8 05/05/2010 1049   MONOABS 0.7 06/16/2020 1342   MONOABS 0.4 05/05/2010 1049   EOSABS 0.3 06/16/2020 1342   EOSABS 0.2 05/05/2010 1049   BASOSABS 0.0 06/16/2020 1342   BASOSABS 0.0 05/05/2010 1049    Lithium Lvl  Date Value Ref Range Status  02/02/2021 0.6 0.6 - 1.2 mmol/L Final   02/02/21 lithium level 0.6 on 900 mg daily  No results found for: PHENYTOIN, PHENOBARB, VALPROATE, CBMZ   .res Assessment: Plan:     Margues was seen today for follow-up, depression and anxiety.  Diagnoses and all orders for this  visit:  Major depressive disorder with single episode, in partial remission (HCC) -     lithium carbonate 300 MG capsule; Take 4 capsules (1,200 mg total) by mouth at bedtime. -     PARoxetine (PAXIL) 40 MG tablet; Take 1 tablet (40 mg total) by mouth daily at 12 noon.  GAD (generalized anxiety disorder) -     PARoxetine (PAXIL) 40 MG tablet; Take 1 tablet (40 mg total) by mouth daily at 12 noon.  Mixed obsessional thoughts and acts  OSA (obstructive sleep apnea) -     modafinil (PROVIGIL) 200 MG tablet; Take 1 tablet (200 mg total) by mouth daily. 1/2 tablet each morning for 1 week and then 1 tablet each morning  Alcohol dependence in remission [facility])   Patient reports more consistent depression but anxiety is better controlled with paroxetine 40 mg daily but not gone.. Continue paroxetine 40 bc anxiety likely worse without it.  While the patient denies symptoms of grandiosity or euphoria he does describe some other manic cycling symptoms including increased interest, increased goal-directed behaviors, increased urge to spend that will last for 6 weeks or so and then stop.  He has these cycles about 3 times a year.  This is suggestive of a bipolar predisposition but I am not yet changing his diagnosis.   Continue CPAP use. New CPAP machine. Continue  modafinil 100mg  AM  Discussed potential metabolic side effects associated with atypical antipsychotics, as well as potential risk for movement side effects. Advised pt to contact office if movement side effects occur. Disc weight gain risk with olanzapine. He agrees.  Call if you have any problems with this.  He remains sober.  He wants to consider counseling to work through some personal issues as well as address specific symptoms like his anger and irritability.  Continue lithium but increase to 1200 mg daily to see if depression is better and less mood cycling.  He admits to feeling worse when he stopped the lithium Counseled patient  regarding potential benefits, risks, and side effects of lithium to include potential risk of lithium affecting thyroid and renal function.  Discussed need for periodic lab monitoring to determine drug level and to assess for potential adverse effects.  Counseled patient regarding signs and symptoms of lithium toxicity and advised that they notify office immediately or seek urgent medical attention if experiencing these signs and symptoms.  Patient advised to contact office with any questions or concerns.  Continue the reduced paroxetine to 40 mg daily bc it helped anxiety.  02/02/21  02/02/21 lithium level 0.6 on 900 mg daily, normal B12 and vitamin D 52  Disc the off-label use of N-Acetylcysteine at 600 mg daily to help with mild cognitive problems.  It can be combined with a B-complex vitamin as the B-12 and folate have been shown to sometimes enhance the effect.  Partial benefit so increase NAC to 1200 mg daily.  Reads on internet about meds.  Disc this.  Constipation management 1.  Lots of water 2.  Powdered fiber supplement such as MiraLAX, Citrucel, etc. preferably with a meal 3.  2 stool softeners a day 4.  Milk of magnesia or magnesium tablets if needed  FU 12 weeks  Lynder Parents, MD, DFAPA   Please see After Visit Summary for patient specific  instructions.  Future Appointments  Date Time Provider [place]  03/17/2021  1:40 PM Jettie Booze, MD [facility]  03/25/2021  3:15 PM Laurin Coder, MD [facility] None    No orders of the defined types were placed in this encounter.      -------------------------------

## 2021-03-04 ENCOUNTER — Ambulatory Visit: Payer: Medicare Other | Admitting: Interventional Cardiology

## 2021-03-06 ENCOUNTER — Other Ambulatory Visit: Payer: Self-pay | Admitting: Psychiatry

## 2021-03-06 DIAGNOSIS — F324 Major depressive disorder, single episode, in partial remission: Secondary | ICD-10-CM

## 2021-03-10 ENCOUNTER — Telehealth: Payer: Self-pay | Admitting: Psychiatry

## 2021-03-10 ENCOUNTER — Encounter: Payer: Self-pay | Admitting: Psychiatry

## 2021-03-10 NOTE — Telephone Encounter (Signed)
Pt called to move apt up. Stated not doing so good. Need med change. Put him on canc list. Advise if need to work in apt. Pt call back # [phone]

## 2021-03-10 NOTE — Telephone Encounter (Signed)
We have made multiple medication changes including several over the phone which have not worked out.  I need to see him before making any further med changes.  Put him on the cancellation list.  I am not sure what medicine change to make until I see him

## 2021-03-10 NOTE — Telephone Encounter (Signed)
Called patient to get more detailed information as to what was going on. He states it was hard to describe - he just doesn't feel well, racing mind, disconnected, loopy, no SI. He said he had been on Paxil for years and thinks he may need a change, questions if he should go back on the olanzapine, or go off the lithium. Feels like he needs to be seen before February.

## 2021-03-11 NOTE — Telephone Encounter (Signed)
Informed patient and he was agreeable to the plan.

## 2021-03-15 NOTE — Progress Notes (Deleted)
Cardiology Office Note   Date:  03/15/2021   ID:  Jonathon Walker, DOB 19-Jun-1965, MRN [id number]  PCP:  [name], NP    No chief complaint on file.  HTN, mitral regurgitation  Wt Readings from Last 3 Encounters:  02/09/21 273 lb (123.8 kg)  01/07/21 273 lb (123.8 kg)  11/03/20 273 lb (123.8 kg)       History of Present Illness: Jonathon Walker is a 55 y.o. male  with prior murmur.  Tobacco abuse and HTN noted. Has been on meds for lipids.   2021 echo showed: "Left ventricular ejection fraction, by estimation, is 60 to 65%. The  left ventricle has normal function. The left ventricle has no regional  wall motion abnormalities. There is severe asymmetric left ventricular  hypertrophy. Left ventricular diastolic   parameters were normal.   2. Right ventricular systolic function is normal. The right ventricular  size is normal. There is mildly elevated pulmonary artery systolic  pressure.   3. The mitral valve is normal in structure. Mild to moderate mitral valve  regurgitation. No evidence of mitral stenosis.   4. The aortic valve is normal in structure. Aortic valve regurgitation is  not visualized. No aortic stenosis is present. "  Past Medical History:  Diagnosis Date   Anxiety    Bimalleolar fracture of left ankle    Depression    Heart murmur    Sleep apnea    uses CPAP nightly    Past Surgical History:  Procedure Laterality Date   ORIF ANKLE FRACTURE Left 09/19/2017   Procedure: OPEN REDUCTION INTERNAL FIXATION (ORIF) ANKLE FRACTURE;  Surgeon: Renette Butters, MD;  Location: [facility];  Service: Orthopedics;  Laterality: Left;   TOOTH EXTRACTION       Current Outpatient Medications  Medication Sig Dispense Refill   amLODipine (NORVASC) 5 MG tablet Take daily 90 tablet 3   atorvastatin (LIPITOR) 40 MG tablet TAKE 1 TABLET(40 MG) BY MOUTH DAILY 90 tablet 3   Cyanocobalamin (VITAMIN B-12 PO) Take by mouth.     lisinopril  (ZESTRIL) 20 MG tablet TAKE 1 TABLET(20 MG) BY MOUTH DAILY 90 tablet 3   lithium carbonate 300 MG capsule TAKE 4 CAPSULES(1200 MG) BY MOUTH AT BEDTIME 360 capsule 0   modafinil (PROVIGIL) 200 MG tablet Take 1 tablet (200 mg total) by mouth daily. 1/2 tablet each morning for 1 week and then 1 tablet each morning 30 tablet 1   PARoxetine (PAXIL) 40 MG tablet Take 1 tablet (40 mg total) by mouth daily at 12 noon. 90 tablet 0   No current facility-administered medications for this visit.    Allergies:   Patient has no known allergies.    Social History:  The patient  reports that he has been smoking cigarettes. He started smoking about 46 years ago. He has been smoking an average of 1.50 packs per day. He has never used smokeless tobacco. He reports that he does not currently use alcohol. He reports that he does not use drugs.   Family History:  The patient's ***family history includes Cancer in his maternal grandfather.    ROS:  Please see the history of present illness.   Otherwise, review of systems are positive for ***.   All other systems are reviewed and negative.    PHYSICAL EXAM: VS:  There were no vitals taken for this visit. , BMI There is no height or weight on file to calculate BMI. GEN: Well  nourished, well developed, in no acute distress HEENT: normal Neck: no JVD, carotid bruits, or masses Cardiac: ***RRR; no murmurs, rubs, or gallops,no edema  Respiratory:  clear to auscultation bilaterally, normal work of breathing GI: soft, nontender, nondistended, + BS MS: no deformity or atrophy Skin: warm and dry, no rash Neuro:  Strength and sensation are intact Psych: euthymic mood, full affect   EKG:   The ekg ordered today demonstrates ***   Recent Labs: 06/16/2020: ALT 34; BUN 16; Creatinine, Ser 1.20; Hemoglobin 15.7; Platelets 252.0; Potassium 4.4; Sodium 136; TSH 1.63   Lipid Panel    Component Value Date/Time   CHOL 145 06/16/2020 1342   TRIG 138.0 06/16/2020 1342    HDL 33.50 (L) 06/16/2020 1342   CHOLHDL 4 06/16/2020 1342   VLDL 27.6 06/16/2020 1342   LDLCALC 84 06/16/2020 1342   LDLDIRECT 204.0 05/31/2018 1100     Other studies Reviewed: Additional studies/ records that were reviewed today with results demonstrating: ***.   ASSESSMENT AND PLAN:  Mitral regurgitation:  HTN: Hyperlipidemia: Tobacco abuse:   Current medicines are reviewed at length with the patient today.  The patient concerns regarding his medicines were addressed.  The following changes have been made:  No change***  Labs/ tests ordered today include: *** No orders of the defined types were placed in this encounter.   Recommend 150 minutes/week of aerobic exercise Low fat, low carb, high fiber diet recommended  Disposition:   FU in ***   Signed, Larae Grooms, MD  03/15/2021 8:05 PM    [facility] [address], [place], [place]  [address] Phone: [phone]; Fax: [phone]

## 2021-03-17 ENCOUNTER — Ambulatory Visit: Payer: Medicare Other | Admitting: Interventional Cardiology

## 2021-03-18 ENCOUNTER — Encounter: Payer: Self-pay | Admitting: Psychiatry

## 2021-03-18 ENCOUNTER — Other Ambulatory Visit: Payer: Self-pay

## 2021-03-18 ENCOUNTER — Ambulatory Visit: Payer: Medicare Other | Admitting: Psychiatry

## 2021-03-18 DIAGNOSIS — F411 Generalized anxiety disorder: Secondary | ICD-10-CM | POA: Diagnosis not present

## 2021-03-18 DIAGNOSIS — F422 Mixed obsessional thoughts and acts: Secondary | ICD-10-CM | POA: Diagnosis not present

## 2021-03-18 DIAGNOSIS — F324 Major depressive disorder, single episode, in partial remission: Secondary | ICD-10-CM | POA: Diagnosis not present

## 2021-03-18 DIAGNOSIS — G4733 Obstructive sleep apnea (adult) (pediatric): Secondary | ICD-10-CM | POA: Diagnosis not present

## 2021-03-18 DIAGNOSIS — F1021 Alcohol dependence, in remission: Secondary | ICD-10-CM

## 2021-03-18 MED ORDER — RISPERIDONE 2 MG PO TABS: 2.0000 mg | ORAL_TABLET | Freq: Every day | ORAL | 1 refills | Status: DC

## 2021-03-18 NOTE — Progress Notes (Signed)
Jonathon Walker [id number] 1965-04-05 55 y.o.  Subjective:   Patient ID:  Jonathon Walker is a 55 y.o. (DOB [birth year]) male.  Chief Complaint:  Chief Complaint  Patient presents with   Follow-up   Depression    Depression        Associated symptoms include fatigue.  Associated symptoms include no decreased concentration and no suicidal ideas.  Past medical history includes anxiety.   Anxiety Symptoms include nervous/anxious behavior. Patient reports no confusion, decreased concentration, palpitations or suicidal ideas.      Jonathon Walker presents to the office today for follow-up of anxiety , depression, alcohol.    seen August 7.  His anxiety was unmanaged at Paxil 40 mg and olanzapine 5 mg so olanzapine was increased to 7.5 daily.  seen December 17, 2018.  His anxiety was unmanaged.  The following change was made: Trial 1-1/2 of the 7.5 mg tablets of olanzapine for anxiety.  He is had a stay at [facility] for alcohol dependence since he was last here. Stayed 28 days and DC before Thanksgiving.  Very helpful.  Was having NM nighly before and they stopped since then.  Not drinking.  Involved in AA since then.  seen March 25, 2019.  The patient was doing better requested a reduction in olanzapine to 7.5 mg nightly to improve energy.  visit April 03, 2019.  He had remained sober and his anxiety was improved with sobriety.  He was having problems with low motivation and forgetfulness.  At the next refill it was decided olanzapine would be reduced from 7.5 to 5 mg daily. He called requesting this urgent appointment because of worsening symptoms associated with returning to work.  seen May 21, 2019.  He was doing relatively well.  The following was noted: Tolerating Wellbutrin and it seems to help. He didn't tolerate the increase. Anxiety is improved off alcohol.  Ok to reduce olanzapine to 2.5 mg daily for a month and stop it.  Hopefully low motivation will improve.    He called back March 23 stating he was more depressed and having a harder time doing routine tasks and having suicidal thoughts.  Because of worsening depression after reducing the olanzapine he was encouraged to increase olanzapine back to 5 mg nightly. He called again March 24 stating he was really struggling but was scheduled for an appointment on March 26.  As of June 14, 2019 he reports the following: Every day a real hard struggle to get through the days.  More anxious and depressed equally. Worse 2 weeks.  Last Saturday so overwhelmed by how he feels he had SI.  Everything is hard. Initial benefit paroxetine has been lost.  Awakens stressed and tired. Adequate hours of sleep.  Will be major chore just to mow grass.  Still sober. SE sexual. Getting appt with CPAP doc. Changes made include: Increasing olanzapine back to 5 mg daily a couple of days prior to the appointment due to phone call and the addition of lithium 300 mg for a few days then increase to 600 mg nightly both to augment the antidepressant and because of suicidal thoughts.  July 15, 2019 appointment, the following is noted: Doing OK with both depression and anxiety.  Kind of like a top always going in mind.  Even when I sleep, when awakens mind still going.  NM nightly for years but less than before stopped drinking.  Smaller and less severe ones that don't wake him.  Theme can't finish  things.  Both depression and anxiety 3/10.  More productive.  Paces himself.  Can live with it.  Sleep 9 hours.  Initially olanzapine stopped the ruminating but it's back some.  Overall still benefit.  No SI and can't rmember why he had them before. Residual energy not great and some ruminating. He had some concerns about sexual side effects from Paxil but agreed that no med changes were necessary despite residual symptoms of depression and anxiety.  08/14/2019 appointment, the following is noted:  He scheduled urgently. Vaccinated. Angry easily  and exhausted and everything is a chore.  Exhausted. Never had appt with CPAP company.  CPAP is 56 years old and on same settings as in the past.  CPAP machine is not working properly but when it did it was helpful for alertness and energy.  Disc need to call them to have it evaluated. To bed 10-7.  Don't feel rested in morning but used to feel rested when first started paroxetine. Plan: Increase Lithium continue to 900 mg daily for irritability  11/19/19 appt with the following noted: He increased to 900 mg and then reduced it but doesn't know why.  Reduced it to 300 nightly. Irritability much better.  Once anger since here with rage but not outward. Still on paroxetine 40, olanzapine 5, Wellbutrin 75 AM. Getting tired in afternoon and worrying about getting depressed but he's not depressed now.  Don't enjoy things like he used to.  Les interest and excitement. Appetite and sleep are normal.   Still adjusting without alcohol and socialization. Worrying about lack of sex drive with paroxetine. Can still ruminate on things until he gets some reassurance through talking with people. Plan: Continue low-dose lithium 300 mg daily Continue Paxil.  Did not feel well at 60 mg so we will continue 40 mg.  Likely to lose anxiety benefit if change it. Continue low-dose Wellbutrin 75  Tolerating Wellbutrin and it seems to help. He didn't tolerate the increase.   Continue olanzapine 5 mg  02/18/2020 appointment with following noted: Open to trying increase paroxetine again bc ruminating wears him out and gets fatigued and it wears him out though is 80% better vs before paroxetine. Doesn't remember trying higher paroxetine.  Ruminates on relationships or work.  Whenever he can talk about it with someone it helps tremendously but also realizes paroxetine helps. Anxiety affects dreams and NM too.  Up and down a little.  Anxiety drove depression before paroxetine.  Best med he's tried. SOBER 1 YEAR SE no  problem.  Except gained 25# in 4 years.  No change in diet and exercise.  No increase in appetite and no food cravings.  Seeing PCP soon. Plan: Increase paroxetine trial to 60 for rumination.  Disc SE.  05/26/20 appt noted: No benefit increase paroxetine nor SE.  Wants to reduce back to 30 mg paroxetine and stop lithium bc it didn't seem to help.   4 days of Calm app has seemed to help rumination.  Does it in afternoon and before bed and it seemed to helps.  Doing a 10 min meditation and it helps.   Dep 4/10.  Anxiety 4/10. And a lot better the last few days.   Plan: Reduce lithium by 1 tablet per week.  Call if there is any suicidal thought. Starting April 1 reduce paroxetine to 40 mg daily. Continue low-dose Wellbutrin 75  Tolerating Wellbutrin and it seems to help. He didn't tolerate the increase.   If doing OK will try  taper as he suggested at next visit. Continue olanzapine 5 mg nightly it was helpful for depression and anxiety.  07/01/2020 appt with following noted: Moved appt up. Now says he understated depression level when he was here the last time. Restarted lithium bc felt more depressed without it. Reduced paroxetine to 40 mg daily. Lethargic.  Mind won't calm down.  Nonrestorative sleep but 8 hours.  Doesn't think sleep is deep enough.  Tense. Goes to [employer]. Started counseling. Wants to try something different with meds. Can have mixed sx for 6 weeks with increased interest in things and motivation and want to spend money but still feels depressed.  3 times in the last year. Plan: Increase  lithium back to 3 daily to see if depression is better and less mood cycling.  He admits to feeling worse when he stopped the lithium Continue the reduce paroxetine to 45 mg daily. DC Wellbutrin Lamotrigine trial 25 mg to 100 mg daily over 4 weeks. Continue olanzapine 5 mg nightly it was helpful for depression and anxiety.  08/26/2020 appointment with the following noted: Sad and  down.  Classic depression sx and very fatigued.  Unusual to have classic depression bc usually is atypical.  No effect from lamotrigine. Wonders if atorvastatin is causing some confusion or depression bc read about it. Depressed for 3 mos. Gained 20#.  It's leveled off now. Olanzapine helped the rumination and anxiety.   Life is more depressing bc not seeing friends like it was bc stopped drinking.   No SE with lithium.  Unless dropping things. Sleep 1030 to 6 but doesn't sleep deep.  Not rested in AM Plan: Increase  lithium back to 3 daily to see if depression is better and less mood cycling.  He admits to feeling worse when he stopped the lithium Continue the reduce paroxetine to 40 mg daily bc it helped anxiety. Lamotrigine increase to 150 mg daily DT no benefit or SE Increase olanzapine10 mg nightly it was helpful for depression and anxiety.  Need more help with depression   09/11/2020 phone call: Pt stated he is foegetting things throughout the day and very unsteady.He works with tools and equptment and this is a problem.He stated he takes 6 Lamictal in the am. MD response: We made 3 med changes at the last visit including increasing lamotrigine to 150 mg daily, increasing olanzapine to 10 mg daily, and restarting lithium.  Any 1 of those changes could possibly cause some of the side effects.  We will have to make 1 change at a time to evaluate this.  Therefore reduce lamotrigine to 100 mg daily.  It may take a couple of weeks before he sees a difference.  11/13/2020 phone call: Patient lm stating he is currently taking Lamotrigine 150 mg. He has decreased it to 75 mg due to side effects per pt ( blurred vision, shaking and coordination issues). Patient stated he is discontinuing before his scheduled October appointment. MD response: Take the lamotrigine 75 mg for 2 weeks and he should be able to stop then without withdrawal.  If he gets shakey, nervous then we need to reduce more slowly and let  us know that.  Otherwise he can stop if he follows these directions.  11/30/2020 phone call: Pt called and advised he would like to taper off of the Olanzapine.  He has gained 8# since his last visit and 30# total since he started taking the med.  He feels steady, not so much stress  now, and would like to see if he can take Lorazepam instead.  Then talk to Dr. Clovis Pu in Oct when they meet. MD response: Reduce olanzapine to one half of the 10 mg tablet at night for 4 weeks and then stop it.  If he gets more depressed then get back in touch with [place] and we will try to do something about the weight gain that olanzapine can cause by using the alternative Lybalvi  12/07/2020 appointment with the following noted: Off lamotrigine .  Reduced olanzapine to 5 mg for a week.  On lithium 900 mg HS, paroxetine 40 Fatigue 10/10.  Dizziness resolved off lamotrigine.  Low fatigue and motivation.  "I want to want to do things".  Stopped drinking almost 18 mos and socially big adjustment.   Still invloved with AA and daily sponsor.  Working [employer].  Jonathon Walker helped. Thinks mild depression with little interest and motivation and no joy.  But not severe.  No SI. No anxiety and it's way better.  Life is more stable and helps.  Working 4 hours daily and occ extrea jobs. Uses new CPAP.  Stil drowsy and fatigue. Plan: Continue CPAP use. New CPAP machine. Trial modafinil 100-200 mg AM Lamotrigine off DT NR Reduced olanzapine to 5 mg daily for a week and well stop in 3 weeks DT wt gain and fatigue  01/20/2021 phone call: I called patient to see if I could get better information. He states he started modafinil a couple of weeks ago, though Rx was written for in September. He states he didn't feel well before taking the modafinil though. He just says he is agitated, irritable, and unstable feeling. When asked what he meant by unstable he stated feeling a "shell of myself".  He has been put on the cancellation list.  MD  response: It is likely the modafinil that is causing this problem.  But it may be corrected with a dose reduction.  I assume he is taking a whole tablet.  If that is the case time to reduce to 1/2 tablet in the morning.  If he is taking half a tablet tell him to reduce it to a quarter of a tablet.  And I agree we need to try to get him in as soon as possible. He reduced modafinil from 200 mg to 100 mg daily.  02/10/2021 appointment with the following noted: Current psych meds lithium 900 mg nightly, paroxetine 40 mg daily, olanzapine recently stopped, modafinil recently tried. Was really bad for awhile feeling depresssed and agitated and anxiety but better now including after reducing modafinil to 100 mg daily. Fatigue is better and in the middle now with energy, not good but not bad. Sleep is better.  CPAP doctor yesterday and they are gathering info pending. NAC is helping with memory. Still sober and committed to it. SE constipation  Plan: No med changes  03/10/2021 phone call from patient complaining of vague feelings of not feeling well and wanting the Paxil changed.  He was placed on the cancellation list.  03/18/2021 appointment urgently scheduled at his request: Remains on paroxetine 40 mg daily, lithium 1200 mg daily, modafinil 200 mg every morning. "Ruminating"  with sense of need to talk to someone.  Then feels better for awhile.  Mostly on relationships with family.  No one to talk to. Wonders need to chang ethe meds.   Don't socialize anymore since sober.  Does online AA everyday at noon. Lost 10# off olanzapine.  Seeing  [name] PhD  On disability for 7-8 years.  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  Sober 2 year 01/2019  Past psych meds:  Paxil 60 "too much",  duloxetine, Wellbutrin 75 (max tolerated),  Zoloft, Viibryd 60 NR Abilify, Rexulti. .  Buspar stopped DT little effect. Xanax,  Olanzapine 10 fatigue lithium  900, worse when he stopped  lithium Lamotrigine NR NAC 600 helped Modafinil 200 agitated  Review of Systems:  Review of Systems  Constitutional:  Positive for fatigue and unexpected weight change.  Cardiovascular:  Negative for palpitations.  Gastrointestinal:  Positive for constipation.  Neurological:  Negative for tremors and weakness.  Psychiatric/Behavioral:  Positive for dysphoric mood. Negative for agitation, behavioral problems, confusion, decreased concentration, hallucinations, self-injury, sleep disturbance and suicidal ideas. The patient is nervous/anxious. The patient is not hyperactive.    Medications: I have reviewed the patient's current medications.  Current Outpatient Medications  Medication Sig Dispense Refill   amLODipine (NORVASC) 5 MG tablet Take daily 90 tablet 3   atorvastatin (LIPITOR) 40 MG tablet TAKE 1 TABLET(40 MG) BY MOUTH DAILY 90 tablet 3   Cyanocobalamin (VITAMIN B-12 PO) Take by mouth.     lisinopril (ZESTRIL) 20 MG tablet TAKE 1 TABLET(20 MG) BY MOUTH DAILY 90 tablet 3   lithium carbonate 300 MG capsule TAKE 4 CAPSULES(1200 MG) BY MOUTH AT BEDTIME 360 capsule 0   modafinil (PROVIGIL) 200 MG tablet Take 1 tablet (200 mg total) by mouth daily. 1/2 tablet each morning for 1 week and then 1 tablet each morning 30 tablet 1   PARoxetine (PAXIL) 40 MG tablet Take 1 tablet (40 mg total) by mouth daily at 12 noon. 90 tablet 0   No current facility-administered medications for this visit.    Medication Side Effects: sexual, weight  Allergies: No Known Allergies  Past Medical History:  Diagnosis Date   Anxiety    Bimalleolar fracture of left ankle    Depression    Heart murmur    Sleep apnea    uses CPAP nightly    Family History  Problem Relation Age of Onset   Cancer Maternal Grandfather    Colon cancer Neg Hx    Colon polyps Neg Hx    Esophageal cancer Neg Hx    Rectal cancer Neg Hx    Stomach cancer Neg Hx     Social History   Socioeconomic History   Marital  status: Single    Spouse name: Not on file   Number of children: Not on file   Years of education: Not on file   Highest education level: Not on file  Occupational History   Not on file  Tobacco Use   Smoking status: Every Day    Packs/day: 1.50    Years: 0.00    Pack years: 0.00    Types: Cigarettes    Start date: 03/22/1975   Smokeless tobacco: Never   Tobacco comments:    He reports he has quit 3 times and recently started back in 2012. Hsm   Vaping Use   Vaping Use: Never used  Substance and Sexual Activity   Alcohol use: Not Currently   Drug use: No   Sexual activity: Not on file  Other Topics Concern   Not on file  Social History Narrative   Not on file   Social Determinants of Health   Financial Resource Strain: Low Risk    Difficulty of Paying Living Expenses: Not very hard  Food Insecurity: No Food  Insecurity   Worried About Charity fundraiser in the Last Year: Never true   [place] in the Last Year: Never true  Transportation Needs: No Transportation Needs   Lack of Transportation (Medical): No   Lack of Transportation (Non-Medical): No  Physical Activity: Inactive   Days of Exercise per Week: 0 days   Minutes of Exercise per Session: 0 min  Stress: Stress Concern Present   Feeling of Stress : To some extent  Social Connections: Unknown   Frequency of Communication with Friends and Family: Twice a week   Frequency of Social Gatherings with Friends and Family: Twice a week   Attends Religious Services: Never   Marine scientist or Organizations: No   Attends Music therapist: Never   Marital Status: Patient refused  Human resources officer Violence: Not At Risk   Fear of Current or Ex-Partner: No   Emotionally Abused: No   Physically Abused: No   Sexually Abused: No    Past Medical History, Surgical history, Social history, and Family history were reviewed and updated as appropriate.   Please see review of systems for further  details on the patient's review from today.   Objective:   Physical Exam:  There were no vitals taken for this visit.  Physical Exam Constitutional:      General: He is not in acute distress.    Appearance: Normal appearance. He is well-developed.     Comments: Red face  Musculoskeletal:        General: No deformity.  Neurological:     Mental Status: He is alert and oriented to person, place, and time.     Motor: No tremor.     Coordination: Coordination normal.     Gait: Gait normal.  Psychiatric:        Attention and Perception: Attention normal. He is attentive.        Mood and Affect: Mood is anxious and depressed. Affect is not labile, blunt, angry or inappropriate.        Speech: Speech normal.        Behavior: Behavior normal. Behavior is not agitated.        Thought Content: Thought content normal. Thought content does not include homicidal or suicidal ideation. Thought content does not include suicidal plan.        Cognition and Memory: Cognition normal.        Judgment: Judgment normal.     Comments: Insight  Fair. Depression and  anxiety are mild to moderate Somewhat chronic sense of urgency     Lab Review:     Component Value Date/Time   NA 136 06/16/2020 1342   K 4.4 06/16/2020 1342   CL 100 06/16/2020 1342   CO2 28 06/16/2020 1342   GLUCOSE 99 06/16/2020 1342   BUN 16 06/16/2020 1342   CREATININE 1.20 06/16/2020 1342   CALCIUM 9.9 06/16/2020 1342   PROT 7.5 06/16/2020 1342   ALBUMIN 4.7 06/16/2020 1342   AST 22 06/16/2020 1342   ALT 34 06/16/2020 1342   ALKPHOS 119 (H) 06/16/2020 1342   BILITOT 0.6 06/16/2020 1342       Component Value Date/Time   WBC 9.9 06/16/2020 1342   RBC 5.08 06/16/2020 1342   HGB 15.7 06/16/2020 1342   HGB 15.2 05/05/2010 1049   HCT 45.2 06/16/2020 1342   HCT 44.8 05/05/2010 1049   PLT 252.0 06/16/2020 1342   PLT 193 05/05/2010 1049   MCV 89.1  06/16/2020 1342   MCV 89.6 05/05/2010 1049   MCH 30.4 05/05/2010 1049    MCHC 34.7 06/16/2020 1342   RDW 13.0 06/16/2020 1342   RDW 13.1 05/05/2010 1049   LYMPHSABS 3.0 06/16/2020 1342   LYMPHSABS 1.8 05/05/2010 1049   MONOABS 0.7 06/16/2020 1342   MONOABS 0.4 05/05/2010 1049   EOSABS 0.3 06/16/2020 1342   EOSABS 0.2 05/05/2010 1049   BASOSABS 0.0 06/16/2020 1342   BASOSABS 0.0 05/05/2010 1049    Lithium Lvl  Date Value Ref Range Status  02/02/2021 0.6 0.6 - 1.2 mmol/L Final   02/02/21 lithium level 0.6 on 900 mg daily  No results found for: PHENYTOIN, PHENOBARB, VALPROATE, CBMZ   .res Assessment: Plan:     There are no diagnoses linked to this encounter.   Patient reports more consistent depression but anxiety is better controlled with paroxetine 40 mg daily but not gone.. Continue paroxetine 40 bc anxiety likely worse without it.  While the patient denies symptoms of grandiosity or euphoria he does describe some other manic cycling symptoms including increased interest, increased goal-directed behaviors, increased urge to spend that will last for 6 weeks or so and then stop.  He has these cycles about 3 times a year.  This is suggestive of a bipolar predisposition but I am not yet changing his diagnosis.   Continue CPAP use. New CPAP machine. Try stopping modafinil 100mg  AM to determine if it is helping or not.  If not less anxious then start risperidone for rumination 1-2 mg HS  Discussed potential metabolic side effects associated with atypical antipsychotics, as well as potential risk for movement side effects. Advised pt to contact office if movement side effects occur. Disc weight gain risk with olanzapine. He agrees.  Call if you have any problems with this.  He remains sober.  He wants to consider counseling to work through some personal issues as well as address specific symptoms like his anger and irritability. Rec local AA instead of online.  Continue lithium but increase to 1200 mg daily to see if depression is better and less mood  cycling.  He admits to feeling worse when he stopped the lithium Counseled patient regarding potential benefits, risks, and side effects of lithium to include potential risk of lithium affecting thyroid and renal function.  Discussed need for periodic lab monitoring to determine drug level and to assess for potential adverse effects.  Counseled patient regarding signs and symptoms of lithium toxicity and advised that they notify office immediately or seek urgent medical attention if experiencing these signs and symptoms.  Patient advised to contact office with any questions or concerns.  Continue the reduced paroxetine to 40 mg daily bc it helped anxiety.  02/02/21  02/02/21 lithium level 0.6 on 900 mg daily, normal B12 and vitamin D 52  Disc the off-label use of N-Acetylcysteine at 600 mg daily to help with mild cognitive problems.  It can be combined with a B-complex vitamin as the B-12 and folate have been shown to sometimes enhance the effect.  Partial benefit so increase NAC to 1200 mg daily.  Reads on internet about meds.  Disc this.  Constipation management 1.  Lots of water 2.  Powdered fiber supplement such as MiraLAX, Citrucel, etc. preferably with a meal 3.  2 stool softeners a day 4.  Milk of magnesia or magnesium tablets if needed  FU 8 weeks  Lynder Parents, MD, DFAPA   Please see After Visit Summary for patient specific instructions.  Future Appointments  Date Time Provider [place]  03/25/2021  3:15 PM Laurin Coder, MD [facility] None  04/05/2021  2:00 PM Cottle, Billey Co., MD [facility] None    No orders of the defined types were placed in this encounter.      -------------------------------

## 2021-03-25 ENCOUNTER — Ambulatory Visit: Payer: Medicare Other | Admitting: Pulmonary Disease

## 2021-03-25 ENCOUNTER — Other Ambulatory Visit: Payer: Self-pay

## 2021-03-25 ENCOUNTER — Encounter: Payer: Self-pay | Admitting: Pulmonary Disease

## 2021-03-25 VITALS — BP 126/80 | HR 78 | Temp 98.1°F | Ht 75.0 in | Wt 277.0 lb

## 2021-03-25 DIAGNOSIS — G4733 Obstructive sleep apnea (adult) (pediatric): Secondary | ICD-10-CM | POA: Diagnosis not present

## 2021-03-25 DIAGNOSIS — Z9989 Dependence on other enabling machines and devices: Secondary | ICD-10-CM | POA: Diagnosis not present

## 2021-03-25 NOTE — Patient Instructions (Signed)
Download from the machine shows that it is working well at the present time Machine is set between 6 and [place] is not recording that you are having difficulty, sleep apnea events are well treated  Continue current care  I will see you on a yearly basis, next visit in a year from now  You can always call if you are having any concerns, we will see you sooner if needed

## 2021-03-25 NOTE — Progress Notes (Signed)
Jonathon Walker    [id number]    Mar 29, 1965  Primary Care Physician:Nafziger, Tommi Rumps, NP  Referring Physician: [name], NP [place],  [place] [address]  Chief complaint:   Patient with a history of obstructive sleep apnea  HPI:  Was having some increased sleepiness, fatigue, excessive tiredness during the day recently Symptoms are improving   Diagnosed with obstructive sleep apnea a few years ago Uses CPAP nightly  Continues to use CPAP nightly No change in symptoms  Machine is set between 6 and 20 Gets about 7 hours 50 minutes of sleep at night   Supplies obtained from [employer]  He does not recall any of his medications have not changed recently-some of his medications do cause fatigue  He has generalized anxiety/depression On multiple medications that may contribute to daytime sleepiness  Usually goes to bed about 10, may take him about half an hour to fall asleep Several awakenings Final wake up time about 7 AM  Weight is up about 15 pounds  Is very tired during the day but not able to sleep during the day He sleeps fairly well at night but not continuously and this has been his norm  He has hypertension, cardiac rhythm problems  He smokes about 2 packs a day   Outpatient Encounter Medications as of 03/25/2021  Medication Sig   amLODipine (NORVASC) 5 MG tablet Take daily   atorvastatin (LIPITOR) 40 MG tablet TAKE 1 TABLET(40 MG) BY MOUTH DAILY   Cyanocobalamin (VITAMIN B-12 PO) Take by mouth.   lisinopril (ZESTRIL) 20 MG tablet TAKE 1 TABLET(20 MG) BY MOUTH DAILY   lithium carbonate 300 MG capsule TAKE 4 CAPSULES(1200 MG) BY MOUTH AT BEDTIME   modafinil (PROVIGIL) 200 MG tablet Take 1 tablet (200 mg total) by mouth daily. 1/2 tablet each morning for 1 week and then 1 tablet each morning   PARoxetine (PAXIL) 40 MG tablet Take 1 tablet (40 mg total) by mouth daily at 12 noon.   risperiDONE (RISPERDAL) 2 MG tablet Take 1  tablet (2 mg total) by mouth at bedtime.   No facility-administered encounter medications on file as of 03/25/2021.    Allergies as of 03/25/2021   (No Known Allergies)    Past Medical History:  Diagnosis Date   Anxiety    Bimalleolar fracture of left ankle    Depression    Heart murmur    Sleep apnea    uses CPAP nightly    Past Surgical History:  Procedure Laterality Date   ORIF ANKLE FRACTURE Left 09/19/2017   Procedure: OPEN REDUCTION INTERNAL FIXATION (ORIF) ANKLE FRACTURE;  Surgeon: Renette Butters, MD;  Location: [facility];  Service: Orthopedics;  Laterality: Left;   TOOTH EXTRACTION      Family History  Problem Relation Age of Onset   Cancer Maternal Grandfather    Colon cancer Neg Hx    Colon polyps Neg Hx    Esophageal cancer Neg Hx    Rectal cancer Neg Hx    Stomach cancer Neg Hx     Social History   Socioeconomic History   Marital status: Single    Spouse name: Not on file   Number of children: Not on file   Years of education: Not on file   Highest education level: Not on file  Occupational History   Not on file  Tobacco Use   Smoking status: Every Day    Packs/day:  1.50    Years: 0.00    Pack years: 0.00    Types: Cigarettes    Start date: 03/22/1975   Smokeless tobacco: Never   Tobacco comments:    He reports he has quit 3 times and recently started back in 2012. Hsm   Vaping Use   Vaping Use: Never used  Substance and Sexual Activity   Alcohol use: Not Currently   Drug use: No   Sexual activity: Not on file  Other Topics Concern   Not on file  Social History Narrative   Not on file   Social Determinants of Health   Financial Resource Strain: Low Risk    Difficulty of Paying Living Expenses: Not very hard  Food Insecurity: No Food Insecurity   Worried About Running Out of Food in the Last Year: Never true   Ran Out of Food in the Last Year: Never true  Transportation Needs: No Transportation Needs   Lack of  Transportation (Medical): No   Lack of Transportation (Non-Medical): No  Physical Activity: Inactive   Days of Exercise per Week: 0 days   Minutes of Exercise per Session: 0 min  Stress: Stress Concern Present   Feeling of Stress : To some extent  Social Connections: Unknown   Frequency of Communication with Friends and Family: Twice a week   Frequency of Social Gatherings with Friends and Family: Twice a week   Attends Religious Services: Never   Marine scientist or Organizations: No   Attends Music therapist: Never   Marital Status: Patient refused  Human resources officer Violence: Not At Risk   Fear of Current or Ex-Partner: No   Emotionally Abused: No   Physically Abused: No   Sexually Abused: No    Review of Systems  Respiratory:  Positive for apnea. Negative for shortness of breath.   Psychiatric/Behavioral:  Positive for sleep disturbance.    Vitals:   03/25/21 1527  BP: 126/80  Pulse: 78  SpO2: 96%     Physical Exam Constitutional:      Appearance: He is obese.  HENT:     Head: Normocephalic.     Mouth/Throat:     Mouth: Mucous membranes are moist.     Comments: Crowded oropharynx, Mallampati 3, macroglossia Eyes:     Pupils: Pupils are equal, round, and reactive to light.  Cardiovascular:     Rate and Rhythm: Normal rate and regular rhythm.     Heart sounds: No murmur heard.   No friction rub.  Pulmonary:     Effort: No respiratory distress.     Breath sounds: No stridor. No wheezing or rhonchi.  Musculoskeletal:     Cervical back: No rigidity or tenderness.  Neurological:     Mental Status: He is alert.  Psychiatric:        Mood and Affect: Mood normal.   Results of the Epworth flowsheet 02/09/2021  Sitting and reading 1  Watching TV 1  Sitting, inactive in a public place (e.g. a theatre or a meeting) 0  As a passenger in a car for an hour without a break 0  Lying down to rest in the afternoon when circumstances permit 1  Sitting  and talking to someone 0  Sitting quietly after a lunch without alcohol 0  In a car, while stopped for a few minutes in traffic 0  Total score 3     Data Reviewed: Compliance data  100% compliance set between 6 and 20 Residual  AHI of 0.8  Assessment:  Obstructive sleep apnea - Compliance with CPAP  Generalized anxiety/depression -No significant changes in medications recently  Symptoms of daytime sleepiness and fatigue are generally better -Continues to improve  Patient appears to be working optimally at present  Plan/Recommendations:  Continue nightly CPAP use  Smoking cessation  Encouraged weight loss efforts  Yearly visit is appropriate and I will see him a year from now  Encouraged to call with any significant concerns  Current machine is about 57 years old  We will see him a year from Sherrilyn Rist MD [facility] 03/25/2021, 3:29 PM  CC: [name], NP

## 2021-03-30 ENCOUNTER — Telehealth: Payer: Self-pay | Admitting: Pharmacist

## 2021-03-30 NOTE — Chronic Care Management (AMB) (Signed)
Chronic Care Management Pharmacy Assistant   Name: Jonathon Walker  MRN: [id number] DOB: [birth year]  Reason for Encounter: Disease State / Hypertension Assessment Call   Conditions to be addressed/monitored: HTN  Recent office visits:  None  Recent consult visits:  03/25/2021 Sherrilyn Rist MD (pulmonary) - Patient was seen for OSA on CPAP. No medication changes. No follow up noted.  03/18/2021 Trula Slade [facility] (psychiatry) - Patient was seen for major depressive disorder and additional issues. Started Risperidone 2 mg daily. Follow up in 2 months.   02/10/2021 Trula Slade [facility] (psychiatry) - Patient was seen for major depressive disorder and additional issues. Increased Lithium to 1200 mg nightly. Discontinued Olanzapine. Follow up in 3 months.   02/09/2021  Sherrilyn Rist MD (pulmonary) - Patient was seen for OSA on CPAP. No medication changes. Follow up in 4 weeks.  Hospital visits:  None  Medications: Outpatient Encounter Medications as of 03/30/2021  Medication Sig   amLODipine (NORVASC) 5 MG tablet Take daily   atorvastatin (LIPITOR) 40 MG tablet TAKE 1 TABLET(40 MG) BY MOUTH DAILY   Cyanocobalamin (VITAMIN B-12 PO) Take by mouth.   lisinopril (ZESTRIL) 20 MG tablet TAKE 1 TABLET(20 MG) BY MOUTH DAILY   lithium carbonate 300 MG capsule TAKE 4 CAPSULES(1200 MG) BY MOUTH AT BEDTIME   modafinil (PROVIGIL) 200 MG tablet Take 1 tablet (200 mg total) by mouth daily. 1/2 tablet each morning for 1 week and then 1 tablet each morning   PARoxetine (PAXIL) 40 MG tablet Take 1 tablet (40 mg total) by mouth daily at 12 noon.   risperiDONE (RISPERDAL) 2 MG tablet Take 1 tablet (2 mg total) by mouth at bedtime.   No facility-administered encounter medications on file as of 03/30/2021.  Fill History: ATORVASTATIN 40MG  TABLETS 02/02/2021 90   lisinopril (PRINIVIL,ZESTRIL) tablet 12/02/2020 90   LITHIUM CARBONATE 300MG  CAPSULES 02/12/2021 90   MODAFINIL 200MG  TABLETS  03/01/2021 30   PAROXETINE 40MG  TABLETS 03/01/2021 90   AMLODIPINE BESYLATE 5MG  TABLETS 02/21/2021 90   RISPERIDONE 2MG  TABLETS 03/18/2021 30   Reviewed chart prior to disease state call. Spoke with patient regarding BP  Recent Office Vitals: BP Readings from Last 3 Encounters:  03/25/21 126/80  02/09/21 118/72  10/09/20 110/60   Pulse Readings from Last 3 Encounters:  03/25/21 78  02/09/21 85  10/09/20 63    Wt Readings from Last 3 Encounters:  03/25/21 277 lb (125.6 kg)  02/09/21 273 lb (123.8 kg)  01/07/21 273 lb (123.8 kg)     Kidney Function Lab Results  Component Value Date/Time   CREATININE 1.20 06/16/2020 01:42 PM   CREATININE 1.14 06/13/2019 08:20 AM   GFR 68.19 06/16/2020 01:42 PM    BMP Latest Ref Rng & Units 06/16/2020 06/13/2019 05/31/2018  Glucose 70 - 99 mg/dL 99 109(H) 103(H)  BUN 6 - 23 mg/dL 16 19 12   Creatinine 0.40 - 1.50 mg/dL 1.20 1.14 1.14  Sodium 135 - 145 mEq/L 136 139 139  Potassium 3.5 - 5.1 mEq/L 4.4 4.2 4.4  Chloride 96 - 112 mEq/L 100 104 102  CO2 19 - 32 mEq/L 28 28 27   Calcium 8.4 - 10.5 mg/dL 9.9 9.6 9.8    Current antihypertensive regimen:  Amlodipine 5 mg daily Lisinopril 20 mg daily  How often are you checking your Blood Pressure?   Current home BP readings:   What recent interventions/DTPs have been made by any provider to improve Blood Pressure control since last CPP Visit:  Any recent hospitalizations or ED visits since last visit with CPP? No recent hospital visits.  What diet changes have been made to improve Blood Pressure Control?   What exercise is being done to improve your Blood Pressure Control?    Adherence Review: Is the patient currently on ACE/ARB medication? Yes Does the patient have >5 day gap between last estimated fill dates? Yes  Care Gaps: AWV - completed on 07/28/20 Last BP - 126/80 on 03/25/2021 HIV screening - never done Zoster vaccines - never done Covid-19 vaccine - overdue Flu vaccine  - due  Star Rating Drugs: Atorvastatin 40mg  - last filled on 02/02/2021 90DS at [facility] Lisinopril 20mg  - last filled on 11/02/2020 300DS at [facility] verified with pharmacy tech.   [place] Pharmacist Assistant [phone]

## 2021-04-05 ENCOUNTER — Ambulatory Visit: Payer: Medicare Other | Admitting: Psychiatry

## 2021-04-05 ENCOUNTER — Other Ambulatory Visit: Payer: Self-pay

## 2021-04-05 ENCOUNTER — Encounter: Payer: Self-pay | Admitting: Psychiatry

## 2021-04-05 DIAGNOSIS — F411 Generalized anxiety disorder: Secondary | ICD-10-CM

## 2021-04-05 DIAGNOSIS — F422 Mixed obsessional thoughts and acts: Secondary | ICD-10-CM | POA: Diagnosis not present

## 2021-04-05 DIAGNOSIS — G4733 Obstructive sleep apnea (adult) (pediatric): Secondary | ICD-10-CM | POA: Diagnosis not present

## 2021-04-05 DIAGNOSIS — F324 Major depressive disorder, single episode, in partial remission: Secondary | ICD-10-CM

## 2021-04-05 DIAGNOSIS — F1021 Alcohol dependence, in remission: Secondary | ICD-10-CM

## 2021-04-05 NOTE — Progress Notes (Signed)
FREELAND PRACHT [id number] October 20, 1965 56 y.o.  Subjective:   Patient ID:  Jonathon Walker is a 56 y.o. (DOB Dec 19, 1965) male.  Chief Complaint:  Chief Complaint  Patient presents with   Follow-up   Depression   Anxiety    Depression        Associated symptoms include fatigue.  Associated symptoms include no decreased concentration and no suicidal ideas.  Past medical history includes anxiety.   Anxiety Symptoms include nervous/anxious behavior. Patient reports no confusion, decreased concentration, palpitations or suicidal ideas.      Jonathon Walker presents to the office today for follow-up of anxiety , depression, alcohol.    seen August 7.  His anxiety was unmanaged at Paxil 40 mg and olanzapine 5 mg so olanzapine was increased to 7.5 daily.  seen December 17, 2018.  His anxiety was unmanaged.  The following change was made: Trial 1-1/2 of the 7.5 mg tablets of olanzapine for anxiety.  He is had a stay at [facility] for alcohol dependence since he was last here. Stayed 28 days and DC before Thanksgiving.  Very helpful.  Was having NM nighly before and they stopped since then.  Not drinking.  Involved in AA since then.  seen March 25, 2019.  The patient was doing better requested a reduction in olanzapine to 7.5 mg nightly to improve energy.  visit April 03, 2019.  He had remained sober and his anxiety was improved with sobriety.  He was having problems with low motivation and forgetfulness.  At the next refill it was decided olanzapine would be reduced from 7.5 to 5 mg daily. He called requesting this urgent appointment because of worsening symptoms associated with returning to work.  seen May 21, 2019.  He was doing relatively well.  The following was noted: Tolerating Wellbutrin and it seems to help. He didn't tolerate the increase. Anxiety is improved off alcohol.  Ok to reduce olanzapine to 2.5 mg daily for a month and stop it.  Hopefully low motivation will  improve.   He called back March 23 stating he was more depressed and having a harder time doing routine tasks and having suicidal thoughts.  Because of worsening depression after reducing the olanzapine he was encouraged to increase olanzapine back to 5 mg nightly. He called again March 24 stating he was really struggling but was scheduled for an appointment on March 26.  As of June 14, 2019 he reports the following: Every day a real hard struggle to get through the days.  More anxious and depressed equally. Worse 2 weeks.  Last Saturday so overwhelmed by how he feels he had SI.  Everything is hard. Initial benefit paroxetine has been lost.  Awakens stressed and tired. Adequate hours of sleep.  Will be major chore just to mow grass.  Still sober. SE sexual. Getting appt with CPAP doc. Changes made include: Increasing olanzapine back to 5 mg daily a couple of days prior to the appointment due to phone call and the addition of lithium 300 mg for a few days then increase to 600 mg nightly both to augment the antidepressant and because of suicidal thoughts.  July 15, 2019 appointment, the following is noted: Doing OK with both depression and anxiety.  Kind of like a top always going in mind.  Even when I sleep, when awakens mind still going.  NM nightly for years but less than before stopped drinking.  Smaller and less severe ones that don't wake him.  Theme can't finish things.  Both depression and anxiety 3/10.  More productive.  Paces himself.  Can live with it.  Sleep 9 hours.  Initially olanzapine stopped the ruminating but it's back some.  Overall still benefit.  No SI and can't rmember why he had them before. Residual energy not great and some ruminating. He had some concerns about sexual side effects from Paxil but agreed that no med changes were necessary despite residual symptoms of depression and anxiety.  08/14/2019 appointment, the following is noted:  He scheduled urgently. Vaccinated.  Angry easily and exhausted and everything is a chore.  Exhausted. Never had appt with CPAP company.  CPAP is 57 years old and on same settings as in the past.  CPAP machine is not working properly but when it did it was helpful for alertness and energy.  Disc need to call them to have it evaluated. To bed 10-7.  Don't feel rested in morning but used to feel rested when first started paroxetine. Plan: Increase Lithium continue to 900 mg daily for irritability  11/19/19 appt with the following noted: He increased to 900 mg and then reduced it but doesn't know why.  Reduced it to 300 nightly. Irritability much better.  Once anger since here with rage but not outward. Still on paroxetine 40, olanzapine 5, Wellbutrin 75 AM. Getting tired in afternoon and worrying about getting depressed but he's not depressed now.  Don't enjoy things like he used to.  Les interest and excitement. Appetite and sleep are normal.   Still adjusting without alcohol and socialization. Worrying about lack of sex drive with paroxetine. Can still ruminate on things until he gets some reassurance through talking with people. Plan: Continue low-dose lithium 300 mg daily Continue Paxil.  Did not feel well at 60 mg so we will continue 40 mg.  Likely to lose anxiety benefit if change it. Continue low-dose Wellbutrin 75  Tolerating Wellbutrin and it seems to help. He didn't tolerate the increase.   Continue olanzapine 5 mg  02/18/2020 appointment with following noted: Open to trying increase paroxetine again bc ruminating wears him out and gets fatigued and it wears him out though is 80% better vs before paroxetine. Doesn't remember trying higher paroxetine.  Ruminates on relationships or work.  Whenever he can talk about it with someone it helps tremendously but also realizes paroxetine helps. Anxiety affects dreams and NM too.  Up and down a little.  Anxiety drove depression before paroxetine.  Best med he's tried. SOBER 1  YEAR SE no problem.  Except gained 25# in 4 years.  No change in diet and exercise.  No increase in appetite and no food cravings.  Seeing PCP soon. Plan: Increase paroxetine trial to 60 for rumination.  Disc SE.  05/26/20 appt noted: No benefit increase paroxetine nor SE.  Wants to reduce back to 30 mg paroxetine and stop lithium bc it didn't seem to help.   4 days of Calm app has seemed to help rumination.  Does it in afternoon and before bed and it seemed to helps.  Doing a 10 min meditation and it helps.   Dep 4/10.  Anxiety 4/10. And a lot better the last few days.   Plan: Reduce lithium by 1 tablet per week.  Call if there is any suicidal thought. Starting April 1 reduce paroxetine to 40 mg daily. Continue low-dose Wellbutrin 75  Tolerating Wellbutrin and it seems to help. He didn't tolerate the increase.   If doing  OK will try taper as he suggested at next visit. Continue olanzapine 5 mg nightly it was helpful for depression and anxiety.  07/01/2020 appt with following noted: Moved appt up. Now says he understated depression level when he was here the last time. Restarted lithium bc felt more depressed without it. Reduced paroxetine to 40 mg daily. Lethargic.  Mind won't calm down.  Nonrestorative sleep but 8 hours.  Doesn't think sleep is deep enough.  Tense. Goes to [employer]. Started counseling. Wants to try something different with meds. Can have mixed sx for 6 weeks with increased interest in things and motivation and want to spend money but still feels depressed.  3 times in the last year. Plan: Increase  lithium back to 3 daily to see if depression is better and less mood cycling.  He admits to feeling worse when he stopped the lithium Continue the reduce paroxetine to 45 mg daily. DC Wellbutrin Lamotrigine trial 25 mg to 100 mg daily over 4 weeks. Continue olanzapine 5 mg nightly it was helpful for depression and anxiety.  08/26/2020 appointment with the following  noted: Sad and down.  Classic depression sx and very fatigued.  Unusual to have classic depression bc usually is atypical.  No effect from lamotrigine. Wonders if atorvastatin is causing some confusion or depression bc read about it. Depressed for 3 mos. Gained 20#.  It's leveled off now. Olanzapine helped the rumination and anxiety.   Life is more depressing bc not seeing friends like it was bc stopped drinking.   No SE with lithium.  Unless dropping things. Sleep 1030 to 6 but doesn't sleep deep.  Not rested in AM Plan: Increase  lithium back to 3 daily to see if depression is better and less mood cycling.  He admits to feeling worse when he stopped the lithium Continue the reduce paroxetine to 40 mg daily bc it helped anxiety. Lamotrigine increase to 150 mg daily DT no benefit or SE Increase olanzapine10 mg nightly it was helpful for depression and anxiety.  Need more help with depression   09/11/2020 phone call: Pt stated he is foegetting things throughout the day and very unsteady.He works with tools and equptment and this is a problem.He stated he takes 6 Lamictal in the am. MD response: We made 3 med changes at the last visit including increasing lamotrigine to 150 mg daily, increasing olanzapine to 10 mg daily, and restarting lithium.  Any 1 of those changes could possibly cause some of the side effects.  We will have to make 1 change at a time to evaluate this.  Therefore reduce lamotrigine to 100 mg daily.  It may take a couple of weeks before he sees a difference.  11/13/2020 phone call: Patient lm stating he is currently taking Lamotrigine 150 mg. He has decreased it to 75 mg due to side effects per pt ( blurred vision, shaking and coordination issues). Patient stated he is discontinuing before his scheduled October appointment. MD response: Take the lamotrigine 75 mg for 2 weeks and he should be able to stop then without withdrawal.  If he gets shakey, nervous then we need to reduce more  slowly and let us know that.  Otherwise he can stop if he follows these directions.  11/30/2020 phone call: Pt called and advised he would like to taper off of the Olanzapine.  He has gained 8# since his last visit and 30# total since he started taking the med.  He feels steady, not  so much stress now, and would like to see if he can take Lorazepam instead.  Then talk to Dr. Clovis Pu in Oct when they meet. MD response: Reduce olanzapine to one half of the 10 mg tablet at night for 4 weeks and then stop it.  If he gets more depressed then get back in touch with [place] and we will try to do something about the weight gain that olanzapine can cause by using the alternative Lybalvi  12/07/2020 appointment with the following noted: Off lamotrigine .  Reduced olanzapine to 5 mg for a week.  On lithium 900 mg HS, paroxetine 40 Fatigue 10/10.  Dizziness resolved off lamotrigine.  Low fatigue and motivation.  "I want to want to do things".  Stopped drinking almost 18 mos and socially big adjustment.   Still invloved with AA and daily sponsor.  Working [employer].  [employer] helped. Thinks mild depression with little interest and motivation and no joy.  But not severe.  No SI. No anxiety and it's way better.  Life is more stable and helps.  Working 4 hours daily and occ extrea jobs. Uses new CPAP.  Stil drowsy and fatigue. Plan: Continue CPAP use. New CPAP machine. Trial modafinil 100-200 mg AM Lamotrigine off DT NR Reduced olanzapine to 5 mg daily for a week and well stop in 3 weeks DT wt gain and fatigue  01/20/2021 phone call: I called patient to see if I could get better information. He states he started modafinil a couple of weeks ago, though Rx was written for in September. He states he didn't feel well before taking the modafinil though. He just says he is agitated, irritable, and unstable feeling. When asked what he meant by unstable he stated feeling a "shell of myself".  He has been put on the cancellation  list.  MD response: It is likely the modafinil that is causing this problem.  But it may be corrected with a dose reduction.  I assume he is taking a whole tablet.  If that is the case time to reduce to 1/2 tablet in the morning.  If he is taking half a tablet tell him to reduce it to a quarter of a tablet.  And I agree we need to try to get him in as soon as possible. He reduced modafinil from 200 mg to 100 mg daily.  02/10/2021 appointment with the following noted: Current psych meds lithium 900 mg nightly, paroxetine 40 mg daily, olanzapine recently stopped, modafinil recently tried. Was really bad for awhile feeling depresssed and agitated and anxiety but better now including after reducing modafinil to 100 mg daily. Fatigue is better and in the middle now with energy, not good but not bad. Sleep is better.  CPAP doctor yesterday and they are gathering info pending. NAC is helping with memory. Still sober and committed to it. SE constipation  Plan: No med changes  03/10/2021 phone call from patient complaining of vague feelings of not feeling well and wanting the Paxil changed.  He was placed on the cancellation list.  03/18/2021 appointment urgently scheduled at his request: Remains on paroxetine 40 mg daily, lithium 1200 mg daily, modafinil 200 mg every morning. "Ruminating"  with sense of need to talk to someone.  Then feels better for awhile.  Mostly on relationships with family.  No one to talk to. Wonders need to chang ethe meds.   Don't socialize anymore since sober.  Does online AA everyday at noon. Lost 10# off  olanzapine. Plan: Continue paroxetine 40 mg, lithium 1200 mg daily, Try stopping modafinil 100mg  AM to determine if it is helping or not. If not less anxious then start risperidone for rumination 1-2 mg HS  04/05/2021 appointment with the following noted: Stopped  modafinil and it helped reduce anxiety. Started risperidone 2 instead of 1 mg and was off balance so  reduced risperidone to 1 mg HS. Only done this for 3 nights.  No SE with it. Anxiety is not close to as bad as originally.  Can be painful but not now. Still ruminating some "my whole adult life" but varies.  Anxiety can lead to depression. 6-7/10 with 10 good on depression with some problems with ambition.   Seeing [name] PhD  On disability for 7-8 years.  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  Sober 2 year 01/2019  Past psych meds:  Paxil 60 "too much",  duloxetine, Wellbutrin 75 (max tolerated),  Zoloft, Viibryd 60 NR Abilify, Rexulti. .  Buspar stopped DT little effect. Xanax,  Olanzapine 10 fatigue risper lithium  900, worse when he stopped lithium Lamotrigine NR NAC 600 helped Modafinil 200 agitated  Review of Systems:  Review of Systems  Constitutional:  Positive for fatigue and unexpected weight change.  Cardiovascular:  Negative for palpitations.  Gastrointestinal:  Positive for constipation.  Neurological:  Negative for tremors and weakness.  Psychiatric/Behavioral:  Positive for dysphoric mood. Negative for agitation, behavioral problems, confusion, decreased concentration, hallucinations, self-injury, sleep disturbance and suicidal ideas. The patient is nervous/anxious. The patient is not hyperactive.    Medications: I have reviewed the patient's current medications.  Current Outpatient Medications  Medication Sig Dispense Refill   amLODipine (NORVASC) 5 MG tablet Take daily 90 tablet 3   atorvastatin (LIPITOR) 40 MG tablet TAKE 1 TABLET(40 MG) BY MOUTH DAILY 90 tablet 3   Cyanocobalamin (VITAMIN B-12 PO) Take by mouth.     lisinopril (ZESTRIL) 20 MG tablet TAKE 1 TABLET(20 MG) BY MOUTH DAILY 90 tablet 3   lithium carbonate 300 MG capsule TAKE 4 CAPSULES(1200 MG) BY MOUTH AT BEDTIME 360 capsule 0   PARoxetine (PAXIL) 40 MG tablet Take 1 tablet (40 mg total) by mouth daily at 12 noon. 90 tablet 0   risperiDONE (RISPERDAL) 2 MG tablet Take 1 tablet (2 mg  total) by mouth at bedtime. (Patient taking differently: Take 1 mg by mouth at bedtime.) 30 tablet 1   No current facility-administered medications for this visit.    Medication Side Effects: sexual, weight  Allergies: No Known Allergies  Past Medical History:  Diagnosis Date   Anxiety    Bimalleolar fracture of left ankle    Depression    Heart murmur    Sleep apnea    uses CPAP nightly    Family History  Problem Relation Age of Onset   Cancer Maternal Grandfather    Colon cancer Neg Hx    Colon polyps Neg Hx    Esophageal cancer Neg Hx    Rectal cancer Neg Hx    Stomach cancer Neg Hx     Social History   Socioeconomic History   Marital status: Single    Spouse name: Not on file   Number of children: Not on file   Years of education: Not on file   Highest education level: Not on file  Occupational History   Not on file  Tobacco Use   Smoking status: Every Day    Packs/day: 1.50    Years: 0.00  Pack years: 0.00    Types: Cigarettes    Start date: 03/22/1975   Smokeless tobacco: Never   Tobacco comments:    He reports he has quit 3 times and recently started back in 2012. Hsm   Vaping Use   Vaping Use: Never used  Substance and Sexual Activity   Alcohol use: Not Currently   Drug use: No   Sexual activity: Not on file  Other Topics Concern   Not on file  Social History Narrative   Not on file   Social Determinants of Health   Financial Resource Strain: Low Risk    Difficulty of Paying Living Expenses: Not very hard  Food Insecurity: No Food Insecurity   Worried About Running Out of Food in the Last Year: Never true   Ran Out of Food in the Last Year: Never true  Transportation Needs: No Transportation Needs   Lack of Transportation (Medical): No   Lack of Transportation (Non-Medical): No  Physical Activity: Inactive   Days of Exercise per Week: 0 days   Minutes of Exercise per Session: 0 min  Stress: Stress Concern Present   Feeling of Stress :  To some extent  Social Connections: Unknown   Frequency of Communication with Friends and Family: Twice a week   Frequency of Social Gatherings with Friends and Family: Twice a week   Attends Religious Services: Never   Marine scientist or Organizations: No   Attends Music therapist: Never   Marital Status: Patient refused  Human resources officer Violence: Not At Risk   Fear of Current or Ex-Partner: No   Emotionally Abused: No   Physically Abused: No   Sexually Abused: No    Past Medical History, Surgical history, Social history, and Family history were reviewed and updated as appropriate.   Please see review of systems for further details on the patient's review from today.   Objective:   Physical Exam:  There were no vitals taken for this visit.  Physical Exam Constitutional:      General: He is not in acute distress.    Appearance: Normal appearance. He is well-developed.     Comments: Red face  Musculoskeletal:        General: No deformity.  Neurological:     Mental Status: He is alert and oriented to person, place, and time.     Motor: No tremor.     Coordination: Coordination normal.     Gait: Gait normal.  Psychiatric:        Attention and Perception: Attention normal. He is attentive.        Mood and Affect: Mood is anxious and depressed. Affect is not labile, blunt, angry or inappropriate.        Speech: Speech normal.        Behavior: Behavior normal. Behavior is not agitated.        Thought Content: Thought content normal. Thought content is not delusional. Thought content does not include homicidal or suicidal ideation. Thought content does not include suicidal plan.        Cognition and Memory: Cognition normal.        Judgment: Judgment normal.     Comments: Insight  Fair. Depression and  anxiety are mild to moderate Somewhat chronic sense of urgency     Lab Review:     Component Value Date/Time   NA 136 06/16/2020 1342   K 4.4  06/16/2020 1342   CL 100 06/16/2020 1342  CO2 28 06/16/2020 1342   GLUCOSE 99 06/16/2020 1342   BUN 16 06/16/2020 1342   CREATININE 1.20 06/16/2020 1342   CALCIUM 9.9 06/16/2020 1342   PROT 7.5 06/16/2020 1342   ALBUMIN 4.7 06/16/2020 1342   AST 22 06/16/2020 1342   ALT 34 06/16/2020 1342   ALKPHOS 119 (H) 06/16/2020 1342   BILITOT 0.6 06/16/2020 1342       Component Value Date/Time   WBC 9.9 06/16/2020 1342   RBC 5.08 06/16/2020 1342   HGB 15.7 06/16/2020 1342   HGB 15.2 05/05/2010 1049   HCT 45.2 06/16/2020 1342   HCT 44.8 05/05/2010 1049   PLT 252.0 06/16/2020 1342   PLT 193 05/05/2010 1049   MCV 89.1 06/16/2020 1342   MCV 89.6 05/05/2010 1049   MCH 30.4 05/05/2010 1049   MCHC 34.7 06/16/2020 1342   RDW 13.0 06/16/2020 1342   RDW 13.1 05/05/2010 1049   LYMPHSABS 3.0 06/16/2020 1342   LYMPHSABS 1.8 05/05/2010 1049   MONOABS 0.7 06/16/2020 1342   MONOABS 0.4 05/05/2010 1049   EOSABS 0.3 06/16/2020 1342   EOSABS 0.2 05/05/2010 1049   BASOSABS 0.0 06/16/2020 1342   BASOSABS 0.0 05/05/2010 1049    Lithium Lvl  Date Value Ref Range Status  02/02/2021 0.6 0.6 - 1.2 mmol/L Final   02/02/21 lithium level 0.6 on 900 mg daily  No results found for: PHENYTOIN, PHENOBARB, VALPROATE, CBMZ   .res Assessment: Plan:     Jonathon Walker was seen today for follow-up, depression and anxiety.  Diagnoses and all orders for this visit:  Major depressive disorder with single episode, in partial remission ([place]) -     Lithium level  GAD (generalized anxiety disorder)  Mixed obsessional thoughts and acts  OSA (obstructive sleep apnea)  Alcohol dependence in remission [facility])    Patient reports more consistent depression but anxiety is better controlled with paroxetine 40 mg daily but not gone.. Continue paroxetine 40 bc anxiety likely worse without it.  While the patient denies symptoms of grandiosity or euphoria he does describe some other manic cycling symptoms including  increased interest, increased goal-directed behaviors, increased urge to spend that will last for 6 weeks or so and then stop.  He has these cycles about 3 times a year.  This is suggestive of a bipolar predisposition but I am not yet changing his diagnosis.   Continue CPAP use. New CPAP machine. Anxiety better off modafinil.  Continue trial risperidone for rumination 1 mg HS  Discussed potential metabolic side effects associated with atypical antipsychotics, as well as potential risk for movement side effects. Advised pt to contact office if movement side effects occur.  He agrees.  Call if you have any problems with this.  He remains sober.  He wants to consider counseling to work through some personal issues as well as address specific symptoms like his anger and irritability. Rec local AA instead of online.  Continue lithium 1200 mg daily to see if depression is better and less mood cycling.  He admits to feeling worse when he stopped the lithium Counseled patient regarding potential benefits, risks, and side effects of lithium to include potential risk of lithium affecting thyroid and renal function.  Discussed need for periodic lab monitoring to determine drug level and to assess for potential adverse effects.  Counseled patient regarding signs and symptoms of lithium toxicity and advised that they notify office immediately or seek urgent medical attention if experiencing these signs and symptoms.  Patient advised to  contact office with any questions or concerns. 02/02/21  02/02/21 lithium level 0.6 on 900 mg daily, normal B12 and vitamin D 52 Check lithiium level.  Continue the reduced paroxetine to 40 mg daily bc it helped anxiety.  Disc the off-label use of N-Acetylcysteine at 600 mg daily to help with mild cognitive problems.  It can be combined with a B-complex vitamin as the B-12 and folate have been shown to sometimes enhance the effect.  Partial benefit so increase NAC to 1200 mg  daily.  Reads on internet about meds.  Disc this. Needs a lot of reassurance.  Constipation management 1.  Lots of water 2.  Powdered fiber supplement such as MiraLAX, Citrucel, etc. preferably with a meal 3.  2 stool softeners a day 4.  Milk of magnesia or magnesium tablets if needed  FU 8 weeks  Lynder Parents, MD, DFAPA   Please see After Visit Summary for patient specific instructions.  Future Appointments  Date Time Provider Jerome  06/16/2021  1:30 PM Cottle, Billey Co., MD [facility] None    Orders Placed This Encounter  Procedures   Lithium level        -------------------------------

## 2021-04-07 ENCOUNTER — Telehealth: Payer: Self-pay | Admitting: Psychiatry

## 2021-04-07 NOTE — Telephone Encounter (Signed)
Pt left a message stating that he forgot to tell dr. Clovis Pu that he has been itching for 2 months now. He thinks it is from the lithium. Please call him at [phone]

## 2021-04-07 NOTE — Telephone Encounter (Signed)
Patient said no psoriasis. He said he didn't equate the itching with lithium at first. He understands the directions to wean 1 tablet a week. He said he was taking the lithium more for depression, not mood swings. He expressed understanding of the wean and had no questions.

## 2021-04-07 NOTE — Telephone Encounter (Signed)
Please see phone message. Called patient and he said you raised his dose of lithium and he feels the itching is related to this. He has not called his PCP because of this. He said he itches all over, no rash. He has not tried anything OTC. No other sx.

## 2021-04-07 NOTE — Telephone Encounter (Signed)
It would have been helpful to have discussed that at the appointment because stopping lithium is somewhat risky.  His mood could get worse or he could start having more mood swings.  But the only way to know if it is causing itching is to get him off of it. Does he have psoriasis?  Because lithium can make psoriasis worse.  If he wants to risk his mood getting worse off the lithium he can reduce it by 1/week until he is off of it.  If he starts having mood swings or more depression then let us know.

## 2021-04-08 NOTE — Telephone Encounter (Signed)
Please verify the dose of risperidone that he is taking.  As of his last appointment with me he had been taking risperidone 2 mg tablets one half at night for 3 weeks.  I do not understand why he thinks he is all of a sudden having a problem with risperidone?  If he is having a mental health problem we should not be changing to medicines or reducing 2 medicines at the same time.  If he wants to decrease risperidone that he needs to increase the lithium back to 4 nightly until the reduced risperidone is stable in about 3 to 4 weeks.  Please let me know what he decides.  Previously he has had more mental health problems whenever he reduced the lithium because he has tried it before.

## 2021-04-08 NOTE — Telephone Encounter (Signed)
Patient called today to say that he was having issues with risperidone. He said he felt reckless and anxious, "like I took something I probably shouldn't have." He cut it back to 1/2 tablet and he said this morning and afternoon has been rough. He said he knows that some medicines level out after a bit and he didn't know if this would. He stated he had dropped down 1 tablet of lithium last night as directed. I asked patient how his itching was and he said it was a little better, but it was at night when it is usually the worst. He asked about staying on 3 tablets if his itching improved with decreasing from 4 tablets and I told him to call and let us know and a decision could be made at that time.

## 2021-04-09 ENCOUNTER — Telehealth: Payer: Self-pay | Admitting: Psychiatry

## 2021-04-09 NOTE — Telephone Encounter (Signed)
Spoke to pt and he is requesting a call back from you to update if dr cottle is going to change anything

## 2021-04-09 NOTE — Telephone Encounter (Signed)
It's not entirely clear what his concern is?  As mentioned, he's been on risperidone 1 mg for 3 weeks.  It would not all of the sudden cause him to feel "reckless and anxious".  In fact risperidone should have a calming effect and help those sx.  Thank you for seeking more information as to what his complaints are and what he is seeking.

## 2021-04-09 NOTE — Telephone Encounter (Signed)
Called patient with instructions. He said he only took one 2 mg tablet and had a reaction to it, since then has been taking 1 tablet. He asks if he should reduce dose more. He said he did not want to go back to 4 tablets of lithium due to itching.   Forwarding to Traci to have her call him also.

## 2021-04-09 NOTE — Telephone Encounter (Signed)
Next visit is 06/16/21. Lavel called and has a questions about one of his medications, His phone number is [phone].

## 2021-04-09 NOTE — Telephone Encounter (Signed)
This has already been done on another message

## 2021-04-09 NOTE — Telephone Encounter (Signed)
Pt called back and I asked him to tell me what was going on and his symptoms since I'm getting in the middle of his phone calls. He reports he is taking 1 mg of Risperidone and last night he took 3 of the 300 mg of Lithium because he has been itching for about 2 months and him and Dr. Clovis Pu discussed that could be a possibility. He reports the itching was less today. I advised him to continue 1 mg Risperidone and 900mg  of Lithium. He is to call next week with an update. He voiced no other complaints or issues.

## 2021-04-09 NOTE — Telephone Encounter (Signed)
Left message for him to call me back to discuss things

## 2021-04-12 NOTE — Telephone Encounter (Signed)
Agreed and noted.

## 2021-04-12 NOTE — Telephone Encounter (Signed)
Pt called back again stating that he is still having problems and would like a call back. [phone]

## 2021-04-12 NOTE — Telephone Encounter (Signed)
Please call him. I talked to him last week and he did not want to follow Dr. Casimiro Needle recommendations.

## 2021-04-13 NOTE — Telephone Encounter (Signed)
Noted I will contact Jonathon Walker

## 2021-04-13 NOTE — Telephone Encounter (Signed)
Pt LVM and asked for a call back.  Next appt 3/29

## 2021-04-13 NOTE — Telephone Encounter (Signed)
Must have called at the same time.  I was able to reach him finally and he reports he did decrease the Lithium to 300 mg last week and feels his itching has improved probably 80%. He also reports the Risperdal 2 mg taking 1/2 tablet made him feel unstable this weekend and I asked for symptoms he reports he feels like he could "go off the handle" he feels like he's all over the place. He feels like the Risperdal isn't working the way it should.  Informed him I would update Dr. Clovis Pu and follow up with recommendation.

## 2021-04-15 ENCOUNTER — Other Ambulatory Visit: Payer: Self-pay | Admitting: Psychiatry

## 2021-04-15 DIAGNOSIS — F411 Generalized anxiety disorder: Secondary | ICD-10-CM

## 2021-04-15 DIAGNOSIS — F324 Major depressive disorder, single episode, in partial remission: Secondary | ICD-10-CM

## 2021-04-15 MED ORDER — DIVALPROEX SODIUM 500 MG PO DR TAB: 500.0000 mg | DELAYED_RELEASE_TABLET | Freq: Two times a day (BID) | ORAL | 0 refills | Status: DC

## 2021-04-15 NOTE — Telephone Encounter (Signed)
Put him on cancellation list.  Patient started having more emotional problems when he went off of lithium previously.  He needs an alternative medicine in order to try to prevent decompensation.  Because he feels like he cannot continue lithium due to itching I will send in a prescription for Depakote with instructions.  He can stop the Risperdal if he does not feel like it is helping.

## 2021-04-16 NOTE — Telephone Encounter (Signed)
Pt is scheduled this Tuesday, 31st and he did stop Risperdal today. He will discuss Depakote at his visit

## 2021-04-20 ENCOUNTER — Ambulatory Visit: Payer: Medicare Other | Admitting: Psychiatry

## 2021-04-20 ENCOUNTER — Encounter: Payer: Self-pay | Admitting: Psychiatry

## 2021-04-20 ENCOUNTER — Other Ambulatory Visit: Payer: Self-pay

## 2021-04-20 DIAGNOSIS — F422 Mixed obsessional thoughts and acts: Secondary | ICD-10-CM

## 2021-04-20 DIAGNOSIS — F324 Major depressive disorder, single episode, in partial remission: Secondary | ICD-10-CM

## 2021-04-20 DIAGNOSIS — F411 Generalized anxiety disorder: Secondary | ICD-10-CM

## 2021-04-20 MED ORDER — RISPERIDONE 3 MG PO TABS: 1.5000 mg | ORAL_TABLET | Freq: Every day | ORAL | 1 refills | Status: DC

## 2021-04-20 NOTE — Progress Notes (Addendum)
Jonathon Walker [id number] 01-31-1966 56 y.o.  Subjective:   Patient ID:  Jonathon Walker is a 56 y.o. (DOB [birth year]) male.  Chief Complaint:  Chief Complaint  Patient presents with   Follow-up   Depression   Anxiety   Medication Problem    Depression        Associated symptoms include fatigue.  Associated symptoms include no decreased concentration and no suicidal ideas.  Past medical history includes anxiety.   Anxiety Symptoms include nervous/anxious behavior. Patient reports no confusion, decreased concentration, dizziness, palpitations or suicidal ideas.      Jonathon Walker presents to the office today for follow-up of anxiety , depression, alcohol.    seen August 7.  His anxiety was unmanaged at Paxil 40 mg and olanzapine 5 mg so olanzapine was increased to 7.5 daily.  seen December 17, 2018.  His anxiety was unmanaged.  The following change was made: Trial 1-1/2 of the 7.5 mg tablets of olanzapine for anxiety.  He is had a stay at [facility] for alcohol dependence since he was last here. Stayed 28 days and DC before Thanksgiving.  Very helpful.  Was having NM nighly before and they stopped since then.  Not drinking.  Involved in AA since then.  seen March 25, 2019.  The patient was doing better requested a reduction in olanzapine to 7.5 mg nightly to improve energy.  visit April 03, 2019.  He had remained sober and his anxiety was improved with sobriety.  He was having problems with low motivation and forgetfulness.  At the next refill it was decided olanzapine would be reduced from 7.5 to 5 mg daily. He called requesting this urgent appointment because of worsening symptoms associated with returning to work.  seen May 21, 2019.  He was doing relatively well.  The following was noted: Tolerating Wellbutrin and it seems to help. He didn't tolerate the increase. Anxiety is improved off alcohol.  Ok to reduce olanzapine to 2.5 mg daily for a month and stop it.   Hopefully low motivation will improve.   He called back March 23 stating he was more depressed and having a harder time doing routine tasks and having suicidal thoughts.  Because of worsening depression after reducing the olanzapine he was encouraged to increase olanzapine back to 5 mg nightly. He called again March 24 stating he was really struggling but was scheduled for an appointment on March 26.  As of June 14, 2019 he reports the following: Every day a real hard struggle to get through the days.  More anxious and depressed equally. Worse 2 weeks.  Last Saturday so overwhelmed by how he feels he had SI.  Everything is hard. Initial benefit paroxetine has been lost.  Awakens stressed and tired. Adequate hours of sleep.  Will be major chore just to mow grass.  Still sober. SE sexual. Getting appt with CPAP doc. Changes made include: Increasing olanzapine back to 5 mg daily a couple of days prior to the appointment due to phone call and the addition of lithium 300 mg for a few days then increase to 600 mg nightly both to augment the antidepressant and because of suicidal thoughts.  July 15, 2019 appointment, the following is noted: Doing OK with both depression and anxiety.  Kind of like a top always going in mind.  Even when I sleep, when awakens mind still going.  NM nightly for years but less than before stopped drinking.  Smaller and less severe ones  that don't wake him.  Theme can't finish things.  Both depression and anxiety 3/10.  More productive.  Paces himself.  Can live with it.  Sleep 9 hours.  Initially olanzapine stopped the ruminating but it's back some.  Overall still benefit.  No SI and can't rmember why he had them before. Residual energy not great and some ruminating. He had some concerns about sexual side effects from Paxil but agreed that no med changes were necessary despite residual symptoms of depression and anxiety.  08/14/2019 appointment, the following is noted:  He  scheduled urgently. Vaccinated. Angry easily and exhausted and everything is a chore.  Exhausted. Never had appt with CPAP company.  CPAP is 57 years old and on same settings as in the past.  CPAP machine is not working properly but when it did it was helpful for alertness and energy.  Disc need to call them to have it evaluated. To bed 10-7.  Don't feel rested in morning but used to feel rested when first started paroxetine. Plan: Increase Lithium continue to 900 mg daily for irritability  11/19/19 appt with the following noted: He increased to 900 mg and then reduced it but doesn't know why.  Reduced it to 300 nightly. Irritability much better.  Once anger since here with rage but not outward. Still on paroxetine 40, olanzapine 5, Wellbutrin 75 AM. Getting tired in afternoon and worrying about getting depressed but he's not depressed now.  Don't enjoy things like he used to.  Les interest and excitement. Appetite and sleep are normal.   Still adjusting without alcohol and socialization. Worrying about lack of sex drive with paroxetine. Can still ruminate on things until he gets some reassurance through talking with people. Plan: Continue low-dose lithium 300 mg daily Continue Paxil.  Did not feel well at 60 mg so we will continue 40 mg.  Likely to lose anxiety benefit if change it. Continue low-dose Wellbutrin 75  Tolerating Wellbutrin and it seems to help. He didn't tolerate the increase.   Continue olanzapine 5 mg  02/18/2020 appointment with following noted: Open to trying increase paroxetine again bc ruminating wears him out and gets fatigued and it wears him out though is 80% better vs before paroxetine. Doesn't remember trying higher paroxetine.  Ruminates on relationships or work.  Whenever he can talk about it with someone it helps tremendously but also realizes paroxetine helps. Anxiety affects dreams and NM too.  Up and down a little.  Anxiety drove depression before paroxetine.   Best med he's tried. SOBER 1 YEAR SE no problem.  Except gained 25# in 4 years.  No change in diet and exercise.  No increase in appetite and no food cravings.  Seeing PCP soon. Plan: Increase paroxetine trial to 60 for rumination.  Disc SE.  05/26/20 appt noted: No benefit increase paroxetine nor SE.  Wants to reduce back to 30 mg paroxetine and stop lithium bc it didn't seem to help.   4 days of Calm app has seemed to help rumination.  Does it in afternoon and before bed and it seemed to helps.  Doing a 10 min meditation and it helps.   Dep 4/10.  Anxiety 4/10. And a lot better the last few days.   Plan: Reduce lithium by 1 tablet per week.  Call if there is any suicidal thought. Starting April 1 reduce paroxetine to 40 mg daily. Continue low-dose Wellbutrin 75  Tolerating Wellbutrin and it seems to help. He didn't tolerate the  increase.   If doing OK will try taper as he suggested at next visit. Continue olanzapine 5 mg nightly it was helpful for depression and anxiety.  07/01/2020 appt with following noted: Moved appt up. Now says he understated depression level when he was here the last time. Restarted lithium bc felt more depressed without it. Reduced paroxetine to 40 mg daily. Lethargic.  Mind won't calm down.  Nonrestorative sleep but 8 hours.  Doesn't think sleep is deep enough.  Tense. Goes to [employer]. Started counseling. Wants to try something different with meds. Can have mixed sx for 6 weeks with increased interest in things and motivation and want to spend money but still feels depressed.  3 times in the last year. Plan: Increase  lithium back to 3 daily to see if depression is better and less mood cycling.  He admits to feeling worse when he stopped the lithium Continue the reduce paroxetine to 45 mg daily. DC Wellbutrin Lamotrigine trial 25 mg to 100 mg daily over 4 weeks. Continue olanzapine 5 mg nightly it was helpful for depression and anxiety.  08/26/2020  appointment with the following noted: Sad and down.  Classic depression sx and very fatigued.  Unusual to have classic depression bc usually is atypical.  No effect from lamotrigine. Wonders if atorvastatin is causing some confusion or depression bc read about it. Depressed for 3 mos. Gained 20#.  It's leveled off now. Olanzapine helped the rumination and anxiety.   Life is more depressing bc not seeing friends like it was bc stopped drinking.   No SE with lithium.  Unless dropping things. Sleep 1030 to 6 but doesn't sleep deep.  Not rested in AM Plan: Increase  lithium back to 3 daily to see if depression is better and less mood cycling.  He admits to feeling worse when he stopped the lithium Continue the reduce paroxetine to 40 mg daily bc it helped anxiety. Lamotrigine increase to 150 mg daily DT no benefit or SE Increase olanzapine10 mg nightly it was helpful for depression and anxiety.  Need more help with depression   09/11/2020 phone call: Pt stated he is foegetting things throughout the day and very unsteady.He works with tools and equptment and this is a problem.He stated he takes 6 Lamictal in the am. MD response: We made 3 med changes at the last visit including increasing lamotrigine to 150 mg daily, increasing olanzapine to 10 mg daily, and restarting lithium.  Any 1 of those changes could possibly cause some of the side effects.  We will have to make 1 change at a time to evaluate this.  Therefore reduce lamotrigine to 100 mg daily.  It may take a couple of weeks before he sees a difference.  11/13/2020 phone call: Patient lm stating he is currently taking Lamotrigine 150 mg. He has decreased it to 75 mg due to side effects per pt ( blurred vision, shaking and coordination issues). Patient stated he is discontinuing before his scheduled October appointment. MD response: Take the lamotrigine 75 mg for 2 weeks and he should be able to stop then without withdrawal.  If he gets shakey,  nervous then we need to reduce more slowly and let us know that.  Otherwise he can stop if he follows these directions.  11/30/2020 phone call: Pt called and advised he would like to taper off of the Olanzapine.  He has gained 8# since his last visit and 30# total since he started taking the med.  He feels steady, not so much stress now, and would like to see if he can take Lorazepam instead.  Then talk to Dr. Clovis Pu in Oct when they meet. MD response: Reduce olanzapine to one half of the 10 mg tablet at night for 4 weeks and then stop it.  If he gets more depressed then get back in touch with [place] and we will try to do something about the weight gain that olanzapine can cause by using the alternative Lybalvi  12/07/2020 appointment with the following noted: Off lamotrigine .  Reduced olanzapine to 5 mg for a week.  On lithium 900 mg HS, paroxetine 40 Fatigue 10/10.  Dizziness resolved off lamotrigine.  Low fatigue and motivation.  "I want to want to do things".  Stopped drinking almost 18 mos and socially big adjustment.   Still invloved with AA and daily sponsor.  Working [employer].  [employer] helped. Thinks mild depression with little interest and motivation and no joy.  But not severe.  No SI. No anxiety and it's way better.  Life is more stable and helps.  Working 4 hours daily and occ extrea jobs. Uses new CPAP.  Stil drowsy and fatigue. Plan: Continue CPAP use. New CPAP machine. Trial modafinil 100-200 mg AM Lamotrigine off DT NR Reduced olanzapine to 5 mg daily for a week and well stop in 3 weeks DT wt gain and fatigue  01/20/2021 phone call: I called patient to see if I could get better information. He states he started modafinil a couple of weeks ago, though Rx was written for in September. He states he didn't feel well before taking the modafinil though. He just says he is agitated, irritable, and unstable feeling. When asked what he meant by unstable he stated feeling a "shell of myself".  He  has been put on the cancellation list.  MD response: It is likely the modafinil that is causing this problem.  But it may be corrected with a dose reduction.  I assume he is taking a whole tablet.  If that is the case time to reduce to 1/2 tablet in the morning.  If he is taking half a tablet tell him to reduce it to a quarter of a tablet.  And I agree we need to try to get him in as soon as possible. He reduced modafinil from 200 mg to 100 mg daily.  02/10/2021 appointment with the following noted: Current psych meds lithium 900 mg nightly, paroxetine 40 mg daily, olanzapine recently stopped, modafinil recently tried. Was really bad for awhile feeling depresssed and agitated and anxiety but better now including after reducing modafinil to 100 mg daily. Fatigue is better and in the middle now with energy, not good but not bad. Sleep is better.  CPAP doctor yesterday and they are gathering info pending. NAC is helping with memory. Still sober and committed to it. SE constipation  Plan: No med changes  03/10/2021 phone call from patient complaining of vague feelings of not feeling well and wanting the Paxil changed.  He was placed on the cancellation list.  03/18/2021 appointment urgently scheduled at his request: Remains on paroxetine 40 mg daily, lithium 1200 mg daily, modafinil 200 mg every morning. "Ruminating"  with sense of need to talk to someone.  Then feels better for awhile.  Mostly on relationships with family.  No one to talk to. Wonders need to chang ethe meds.   Don't socialize anymore since sober.  Does online AA everyday at  noon. Lost 10# off olanzapine. Plan: Continue paroxetine 40 mg, lithium 1200 mg daily, Try stopping modafinil 100mg  AM to determine if it is helping or not. If not less anxious then start risperidone for rumination 1-2 mg HS  04/05/2021 appointment with the following noted: Stopped  modafinil and it helped reduce anxiety. Started risperidone 2 instead of  1 mg and was off balance so reduced risperidone to 1 mg HS. Only done this for 3 nights.  No SE with it. Anxiety is not close to as bad as originally.  Can be painful but not now. Still ruminating some "my whole adult life" but varies.  Anxiety can lead to depression. 6-7/10 with 10 good on depression with some problems with ambition. Plan: Continue trial risperidone for rumination 1 mg HS  04/07/2021 TC: Several phone calls: Called 2 days after the visit complaining of itching from the lithium and wanting to come off of it.  He was warned he was more emotionally distressed when he reduced the lithium in the past but he wanted to pursue it anyway.  He was continuing risperidone 1 mg nightly but wanting to reduce that also but was warned he would almost certainly have worsening mood severity and anxiety problems based on past experience.  He was cautioned against reducing lithium below 900 mg daily and risperidone below 1 mg nightly.  He agreed on 04/09/2021.  04/09/2021 he called again and several phone calls ensued thereafter.  He insisted on stopping lithium and risperidone despite warnings that his mood stability and anxiety would worsen. He was prescribed Depakote to take the place  04/20/2021 appointment with the following noted: He stopped risperidone and did not take the Depakote.  He is taking lithium 900 mg nightly and paroxetine 40 mg daily. Stopped risperidone bc feeling agitated and unstable and blamed it.  Still feels the same off of it. Says itching 85% better with less lithium.  Itching for a couple of mos.  Doesn't think anxiety noticeably worse after reducing lithium to 900 mg daily. Not dizzy.  Clumsy with hands. Anxious but not painful like it was before Paxil but tense at the end of the day.  Initially felt normal for the first time in 20 years when first started paxil but not as good now. Not sadness and hasn't had much of it. Started therapy with doctor on demand. Ruminating on  "fear of not being fully present" and worries about little things like appts pending.   Afraid of working FT because gets overwhelmed bc "I take it home with me"  Seeing [name] PhD  On disability for 7-8 years.  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  Sober 2 year 01/2019  Past psych meds:  Paxil 60 "too much",  duloxetine, Wellbutrin 75 (max tolerated),  Zoloft, Viibryd 60 NR Abilify, Rexulti. .  Buspar stopped DT little effect. Xanax,  Olanzapine 10 fatigue Risperdal 2 once lithium  900, worse when he stopped lithium Lamotrigine NR CO dizzy NAC 600 helped Modafinil 200 agitated  Review of Systems:  Review of Systems  Constitutional:  Positive for fatigue. Negative for unexpected weight change.  Cardiovascular:  Negative for palpitations.  Gastrointestinal:  Positive for constipation.  Neurological:  Negative for dizziness, tremors and weakness.  Psychiatric/Behavioral:  Positive for dysphoric mood. Negative for agitation, behavioral problems, confusion, decreased concentration, hallucinations, self-injury, sleep disturbance and suicidal ideas. The patient is nervous/anxious. The patient is not hyperactive.    Medications: I have reviewed the patient's current medications.  Current Outpatient Medications  Medication Sig Dispense Refill   amLODipine (NORVASC) 5 MG tablet Take daily 90 tablet 3   atorvastatin (LIPITOR) 40 MG tablet TAKE 1 TABLET(40 MG) BY MOUTH DAILY 90 tablet 3   Cyanocobalamin (VITAMIN B-12 PO) Take by mouth.     lithium carbonate 300 MG capsule TAKE 4 CAPSULES(1200 MG) BY MOUTH AT BEDTIME (Patient taking differently: 3 capsules HS) 360 capsule 0   PARoxetine (PAXIL) 40 MG tablet Take 1 tablet (40 mg total) by mouth daily at 12 noon. 90 tablet 0   divalproex (DEPAKOTE) 500 MG DR tablet Take 1 tablet (500 mg total) by mouth 2 (two) times daily. (Patient not taking: Reported on 04/20/2021) 60 tablet 0   lisinopril (ZESTRIL) 20 MG tablet TAKE 1 TABLET(20  MG) BY MOUTH DAILY (Patient not taking: Reported on 04/20/2021) 90 tablet 3   risperiDONE (RISPERDAL) 3 MG tablet Take 0.5 tablets (1.5 mg total) by mouth at bedtime. 15 tablet 1   No current facility-administered medications for this visit.    Medication Side Effects: sexual, weight  Allergies: No Known Allergies  Past Medical History:  Diagnosis Date   Anxiety    Bimalleolar fracture of left ankle    Depression    Heart murmur    Sleep apnea    uses CPAP nightly    Family History  Problem Relation Age of Onset   Cancer Maternal Grandfather    Colon cancer Neg Hx    Colon polyps Neg Hx    Esophageal cancer Neg Hx    Rectal cancer Neg Hx    Stomach cancer Neg Hx     Social History   Socioeconomic History   Marital status: Single    Spouse name: Not on file   Number of children: Not on file   Years of education: Not on file   Highest education level: Not on file  Occupational History   Not on file  Tobacco Use   Smoking status: Every Day    Packs/day: 1.50    Years: 0.00    Pack years: 0.00    Types: Cigarettes    Start date: 03/22/1975   Smokeless tobacco: Never   Tobacco comments:    He reports he has quit 3 times and recently started back in 2012. Hsm   Vaping Use   Vaping Use: Never used  Substance and Sexual Activity   Alcohol use: Not Currently   Drug use: No   Sexual activity: Not on file  Other Topics Concern   Not on file  Social History Narrative   Not on file   Social Determinants of Health   Financial Resource Strain: Low Risk    Difficulty of Paying Living Expenses: Not very hard  Food Insecurity: No Food Insecurity   Worried About Running Out of Food in the Last Year: Never true   Ran Out of Food in the Last Year: Never true  Transportation Needs: No Transportation Needs   Lack of Transportation (Medical): No   Lack of Transportation (Non-Medical): No  Physical Activity: Inactive   Days of Exercise per Week: 0 days   Minutes of  Exercise per Session: 0 min  Stress: Stress Concern Present   Feeling of Stress : To some extent  Social Connections: Unknown   Frequency of Communication with Friends and Family: Twice a week   Frequency of Social Gatherings with Friends and Family: Twice a week   Attends Religious Services: Never   Retail buyer of [employer]  or Organizations: No   Attends Archivist Meetings: Never   Marital Status: Patient refused  Intimate Partner Violence: Not At Risk   Fear of Current or Ex-Partner: No   Emotionally Abused: No   Physically Abused: No   Sexually Abused: No    Past Medical History, Surgical history, Social history, and Family history were reviewed and updated as appropriate.   Please see review of systems for further details on the patient's review from today.   Objective:   Physical Exam:  There were no vitals taken for this visit.  Physical Exam Constitutional:      General: He is not in acute distress.    Appearance: Normal appearance. He is well-developed.     Comments: Red face  Musculoskeletal:        General: No deformity.  Neurological:     Mental Status: He is alert and oriented to person, place, and time.     Motor: No tremor.     Coordination: Coordination normal.     Gait: Gait normal.  Psychiatric:        Attention and Perception: Attention normal. He is attentive.        Mood and Affect: Mood is anxious and depressed. Affect is not labile, blunt, angry or inappropriate.        Speech: Speech normal.        Behavior: Behavior normal. Behavior is not agitated.        Thought Content: Thought content normal. Thought content is not delusional. Thought content does not include homicidal or suicidal ideation. Thought content does not include suicidal plan.        Cognition and Memory: Cognition normal.        Judgment: Judgment normal.     Comments: Insight  Fair. Depression and  anxiety are mild to moderate Somewhat chronic sense of urgency Chronic  dependent reassurance seeking     Lab Review:     Component Value Date/Time   NA 136 06/16/2020 1342   K 4.4 06/16/2020 1342   CL 100 06/16/2020 1342   CO2 28 06/16/2020 1342   GLUCOSE 99 06/16/2020 1342   BUN 16 06/16/2020 1342   CREATININE 1.20 06/16/2020 1342   CALCIUM 9.9 06/16/2020 1342   PROT 7.5 06/16/2020 1342   ALBUMIN 4.7 06/16/2020 1342   AST 22 06/16/2020 1342   ALT 34 06/16/2020 1342   ALKPHOS 119 (H) 06/16/2020 1342   BILITOT 0.6 06/16/2020 1342       Component Value Date/Time   WBC 9.9 06/16/2020 1342   RBC 5.08 06/16/2020 1342   HGB 15.7 06/16/2020 1342   HGB 15.2 05/05/2010 1049   HCT 45.2 06/16/2020 1342   HCT 44.8 05/05/2010 1049   PLT 252.0 06/16/2020 1342   PLT 193 05/05/2010 1049   MCV 89.1 06/16/2020 1342   MCV 89.6 05/05/2010 1049   MCH 30.4 05/05/2010 1049   MCHC 34.7 06/16/2020 1342   RDW 13.0 06/16/2020 1342   RDW 13.1 05/05/2010 1049   LYMPHSABS 3.0 06/16/2020 1342   LYMPHSABS 1.8 05/05/2010 1049   MONOABS 0.7 06/16/2020 1342   MONOABS 0.4 05/05/2010 1049   EOSABS 0.3 06/16/2020 1342   EOSABS 0.2 05/05/2010 1049   BASOSABS 0.0 06/16/2020 1342   BASOSABS 0.0 05/05/2010 1049    Lithium Lvl  Date Value Ref Range Status  02/02/2021 0.6 0.6 - 1.2 mmol/L Final   02/02/21 lithium level 0.6 on 900 mg daily  No results found for: PHENYTOIN,  PHENOBARB, VALPROATE, CBMZ   .res Assessment: Plan:     Ledford was seen today for follow-up, depression, anxiety and medication problem.  Diagnoses and all orders for this visit:  GAD (generalized anxiety disorder) -     risperiDONE (RISPERDAL) 3 MG tablet; Take 0.5 tablets (1.5 mg total) by mouth at bedtime.  Mixed obsessional thoughts and acts -     risperiDONE (RISPERDAL) 3 MG tablet; Take 0.5 tablets (1.5 mg total) by mouth at bedtime.  Major depressive disorder with single episode, in partial remission (HCC) -     risperiDONE (RISPERDAL) 3 MG tablet; Take 0.5 tablets (1.5 mg total) by  mouth at bedtime.   Chronic recurrent rumination with anxiety and urgency and sense of need and instablity with impatience but not quite desperate.  Denies sadness. Chronic worry and overwhelmed easily and misattributes sx as SE of meds leading to inadequate med trials.  Fear of meds.  Patient reports more consistent depression but anxiety is better controlled with paroxetine 40 mg daily but not gone.. Continue paroxetine 40 bc anxiety likely worse without it.  While the patient denies symptoms of grandiosity or euphoria he does describe some other manic cycling symptoms including increased interest, increased goal-directed behaviors, increased urge to spend that will last for 6 weeks or so and then stop.  He has these cycles about 3 times a year.  This is suggestive of a bipolar predisposition but I am not yet changing his diagnosis.   Continue CPAP use. New CPAP machine.  Continue trial risperidone but increase to rumination 1.5 mg HS 1 mg risperidone didn't do very much but he's afraid to take 2 mg daily. Falsely attributes his psych sx to the meds and then stops meds AMA.  False attribution of sx as SE. Disc giving this at least a week before he decides he's having SE.  Discussed potential metabolic side effects associated with atypical antipsychotics, as well as potential risk for movement side effects. Advised pt to contact office if movement side effects occur.  He agrees.  Call if you have any problems with this.  He remains sober.  He wants to consider counseling to work through some personal issues as well as address specific symptoms like his anger and irritability. Rec local AA instead of online.  Continue lithium 900 mg daily   He admits to feeling worse when he stopped the lithium Counseled patient regarding potential benefits, risks, and side effects of lithium to include potential risk of lithium affecting thyroid and renal function.  Discussed need for periodic lab  monitoring to determine drug level and to assess for potential adverse effects.  Counseled patient regarding signs and symptoms of lithium toxicity and advised that they notify office immediately or seek urgent medical attention if experiencing these signs and symptoms.  Patient advised to contact office with any questions or concerns. 02/02/21  02/02/21 lithium level 0.6 on 900 mg daily, normal B12 and vitamin D 52 Check lithiium level.  Continue the reduced paroxetine to 40 mg daily bc it helped anxiety partially  Disc the off-label use of N-Acetylcysteine at 600 mg daily to help with mild cognitive problems.  It can be combined with a B-complex vitamin as the B-12 and folate have been shown to sometimes enhance the effect.  Partial benefit so increase NAC to 1200 mg daily.  Reads on internet about meds.  Disc this. Needs a lot of reassurance.  Constipation management 1.  Lots of water 2.  Powdered fiber supplement  such as MiraLAX, Citrucel, etc. preferably with a meal 3.  2 stool softeners a day 4.  Milk of magnesia or magnesium tablets if needed  FU 8 weeks  Lynder Parents, MD, DFAPA   Please see After Visit Summary for patient specific instructions.  Future Appointments  Date Time Provider [place]  06/16/2021  1:30 PM Cottle, Billey Co., MD [facility]-CP None    No orders of the defined types were placed in this encounter.       -------------------------------

## 2021-04-21 DIAGNOSIS — G4733 Obstructive sleep apnea (adult) (pediatric): Secondary | ICD-10-CM | POA: Diagnosis not present

## 2021-04-21 DIAGNOSIS — I10 Essential (primary) hypertension: Secondary | ICD-10-CM | POA: Diagnosis not present

## 2021-04-27 ENCOUNTER — Ambulatory Visit: Payer: Medicare Other | Admitting: Psychiatry

## 2021-05-13 ENCOUNTER — Ambulatory Visit: Payer: Medicare Other | Admitting: Psychiatry

## 2021-05-22 ENCOUNTER — Other Ambulatory Visit: Payer: Self-pay | Admitting: Psychiatry

## 2021-05-22 DIAGNOSIS — F411 Generalized anxiety disorder: Secondary | ICD-10-CM

## 2021-05-22 DIAGNOSIS — F324 Major depressive disorder, single episode, in partial remission: Secondary | ICD-10-CM

## 2021-05-24 ENCOUNTER — Other Ambulatory Visit: Payer: Self-pay | Admitting: Psychiatry

## 2021-05-24 DIAGNOSIS — F411 Generalized anxiety disorder: Secondary | ICD-10-CM

## 2021-05-24 DIAGNOSIS — F324 Major depressive disorder, single episode, in partial remission: Secondary | ICD-10-CM

## 2021-06-01 ENCOUNTER — Ambulatory Visit (INDEPENDENT_AMBULATORY_CARE_PROVIDER_SITE_OTHER): Payer: Medicare Other | Admitting: Psychiatry

## 2021-06-01 ENCOUNTER — Encounter: Payer: Self-pay | Admitting: Psychiatry

## 2021-06-01 ENCOUNTER — Other Ambulatory Visit: Payer: Self-pay

## 2021-06-01 DIAGNOSIS — F324 Major depressive disorder, single episode, in partial remission: Secondary | ICD-10-CM

## 2021-06-01 DIAGNOSIS — F422 Mixed obsessional thoughts and acts: Secondary | ICD-10-CM | POA: Diagnosis not present

## 2021-06-01 DIAGNOSIS — F411 Generalized anxiety disorder: Secondary | ICD-10-CM | POA: Diagnosis not present

## 2021-06-01 DIAGNOSIS — G4733 Obstructive sleep apnea (adult) (pediatric): Secondary | ICD-10-CM

## 2021-06-01 DIAGNOSIS — F1021 Alcohol dependence, in remission: Secondary | ICD-10-CM

## 2021-06-01 MED ORDER — FLUVOXAMINE MALEATE 100 MG PO TABS: 200.0000 mg | ORAL_TABLET | Freq: Every day | ORAL | 1 refills | Status: DC

## 2021-06-01 NOTE — Progress Notes (Signed)
Jonathon Walker ?[id number] ?09-Dec-1965 ?56 y.o. ? ?Subjective:  ? ?Patient ID:  Jonathon Walker is a 56 y.o. (DOB [birth year]) male. ? ?Chief Complaint:  ?Chief Complaint  ?Patient presents with  ? Follow-up  ? Depression  ? Anxiety  ? ? ?Depression ?       Associated symptoms include fatigue.  Associated symptoms include no decreased concentration and no suicidal ideas.  Past medical history includes anxiety.   ?Anxiety ?Symptoms include nervous/anxious behavior. Patient reports no chest pain, confusion, decreased concentration, dizziness or suicidal ideas.  ? ?   ?Jonathon Walker presents to the office today for follow-up of anxiety , depression, alcohol.   ? ?seen August 7.  His anxiety was unmanaged at Paxil 40 mg and olanzapine 5 mg so olanzapine was increased to 7.5 daily. ? ?seen December 17, 2018.  His anxiety was unmanaged.  The following change was made: ?Trial 1-1/2 of the 7.5 mg tablets of olanzapine for anxiety. ? ?He is had a stay at [facility] for alcohol dependence since he was last here. Stayed 28 days and DC before Thanksgiving.  Very helpful.  Was having NM nighly before and they stopped since then.  Not drinking.  Involved in AA since then. ? ?seen March 25, 2019.  The patient was doing better requested a reduction in olanzapine to 7.5 mg nightly to improve energy. ? ?visit April 03, 2019.  He had remained sober and his anxiety was improved with sobriety.  He was having problems with low motivation and forgetfulness.  At the next refill it was decided olanzapine would be reduced from 7.5 to 5 mg daily. He called requesting this urgent appointment because of worsening symptoms associated with returning to work. ? ?seen May 21, 2019.  He was doing relatively well.  The following was noted: ?Tolerating Wellbutrin and it seems to help. He didn't tolerate the increase. ?Anxiety is improved off alcohol.  Ok to reduce olanzapine to 2.5 mg daily for a month and stop it.  Hopefully low motivation  will improve.  ? ?He called back March 23 stating he was more depressed and having a harder time doing routine tasks and having suicidal thoughts.  Because of worsening depression after reducing the olanzapine he was encouraged to increase olanzapine back to 5 mg nightly. ?He called again March 24 stating he was really struggling but was scheduled for an appointment on March 26. ? ?As of June 14, 2019 he reports the following: ?Every day a real hard struggle to get through the days.  More anxious and depressed equally. Worse 2 weeks.  Last Saturday so overwhelmed by how he feels he had SI.  Everything is hard. Initial benefit paroxetine has been lost.  Awakens stressed and tired. Adequate hours of sleep.  Will be major chore just to mow grass.  Still sober. ?SE sexual. ?Getting appt with CPAP doc. ?Changes made include: Increasing olanzapine back to 5 mg daily a couple of days prior to the appointment due to phone call and the addition of lithium 300 mg for a few days then increase to 600 mg nightly both to augment the antidepressant and because of suicidal thoughts. ? ?July 15, 2019 appointment, the following is noted: ?Doing OK with both depression and anxiety.  Kind of like a top always going in mind.  Even when I sleep, when awakens mind still going.  NM nightly for years but less than before stopped drinking.  Smaller and less severe ones that don't wake  him.  Theme can't finish things.  Both depression and anxiety 3/10.  More productive.  Paces himself.  Can live with it.  Sleep 9 hours.  Initially olanzapine stopped the ruminating but it's back some.  Overall still benefit.  No SI and can't rmember why he had them before. Residual energy not great and some ruminating. ?He had some concerns about sexual side effects from Paxil but agreed that no med changes were necessary despite residual symptoms of depression and anxiety. ? ?08/14/2019 appointment, the following is noted:  He scheduled  urgently. ?Vaccinated. Angry easily and exhausted and everything is a chore.  Exhausted. ?Never had appt with CPAP company.  CPAP is 57 years old and on same settings as in the past.  CPAP machine is not working properly but when it did it was helpful for alertness and energy.  Disc need to call them to have it evaluated. ?To bed 10-7.  Don't feel rested in morning but used to feel rested when first started paroxetine. ?Plan: Increase Lithium continue to 900 mg daily for irritability ? ?11/19/19 appt with the following noted: ?He increased to 900 mg and then reduced it but doesn't know why.  Reduced it to 300 nightly. ?Irritability much better.  Once anger since here with rage but not outward. ?Still on paroxetine 40, olanzapine 5, Wellbutrin 75 AM. ?Getting tired in afternoon and worrying about getting depressed but he's not depressed now.  Don't enjoy things like he used to.  Les interest and excitement. ?Appetite and sleep are normal.   ?Still adjusting without alcohol and socialization. ?Worrying about lack of sex drive with paroxetine. ?Can still ruminate on things until he gets some reassurance through talking with people. ?Plan: Continue low-dose lithium 300 mg daily ?Continue Paxil.  Did not feel well at 60 mg so we will continue 40 mg.  Likely to lose anxiety benefit if change it. ?Continue low-dose Wellbutrin 75  Tolerating Wellbutrin and it seems to help. He didn't tolerate the increase.   ?Continue olanzapine 5 mg ? ?02/18/2020 appointment with following noted: ?Open to trying increase paroxetine again bc ruminating wears him out and gets fatigued and it wears him out though is 80% better vs before paroxetine. Doesn't remember trying higher paroxetine.  Ruminates on relationships or work.  Whenever he can talk about it with someone it helps tremendously but also realizes paroxetine helps. ?Anxiety affects dreams and NM too.  Up and down a little.  Anxiety drove depression before paroxetine.  Best med he's  tried. ?SOBER 1 YEAR ?SE no problem.  Except gained 25# in 4 years.  No change in diet and exercise.  No increase in appetite and no food cravings. ? Seeing PCP soon. ?Plan: Increase paroxetine trial to 60 for rumination.  Disc SE. ? ?05/26/20 appt noted: ?No benefit increase paroxetine nor SE.  ?Wants to reduce back to 30 mg paroxetine and stop lithium bc it didn't seem to help.   ?4 days of Calm app has seemed to help rumination.  Does it in afternoon and before bed and it seemed to helps.  Doing a 10 min meditation and it helps.   ?Dep 4/10.  Anxiety 4/10. And a lot better the last few days.   ?Plan: Reduce lithium by 1 tablet per week.  Call if there is any suicidal thought. ?Starting April 1 reduce paroxetine to 40 mg daily. ?Continue low-dose Wellbutrin 75  Tolerating Wellbutrin and it seems to help. He didn't tolerate the increase.  If doing OK will try taper as he suggested at next visit. ?Continue olanzapine 5 mg nightly it was helpful for depression and anxiety. ? ?07/01/2020 appt with following noted: ?Moved appt up. ?Now says he understated depression level when he was here the last time. ?Restarted lithium bc felt more depressed without it. ?Reduced paroxetine to 40 mg daily. ?Lethargic.  Mind won't calm down.  Nonrestorative sleep but 8 hours.  Doesn't think sleep is deep enough.  Tense. ?Goes to [employer]. Started counseling. ?Wants to try something different with meds. ?Can have mixed sx for 6 weeks with increased interest in things and motivation and want to spend money but still feels depressed.  3 times in the last year. ?Plan: Increase  lithium back to 3 daily to see if depression is better and less mood cycling.  He admits to feeling worse when he stopped the lithium ?Continue the reduce paroxetine to 45 mg daily. ?DC Wellbutrin ?Lamotrigine trial 25 mg to 100 mg daily over 4 weeks. ?Continue olanzapine 5 mg nightly it was helpful for depression and anxiety. ? ?08/26/2020 appointment with  the following noted: ?Sad and down.  Classic depression sx and very fatigued.  Unusual to have classic depression bc usually is atypical.  No effect from lamotrigine. ?Wonders if atorvastatin is causing some confusion or

## 2021-06-01 NOTE — Patient Instructions (Signed)
Reduce paroxetine to three quarters of a tablet and add one half fluvoxamine daily for 5 days, ?Then reduce paroxetine to one half daily and increase fluvoxamine to 1 tablet daily for 5 days, ?Then reduce paroxetine to 1/4 tablet daily and increase fluvoxamine to 1-1/2 tablets daily for 5 days, ?Then stop paroxetine and increase fluvoxamine to 2 tablets daily ?

## 2021-06-16 ENCOUNTER — Ambulatory Visit: Payer: Medicare Other | Admitting: Psychiatry

## 2021-06-17 ENCOUNTER — Encounter: Payer: Self-pay | Admitting: Adult Health

## 2021-06-17 ENCOUNTER — Ambulatory Visit (INDEPENDENT_AMBULATORY_CARE_PROVIDER_SITE_OTHER): Payer: Medicare Other | Admitting: Adult Health

## 2021-06-17 VITALS — BP 100/62 | HR 65 | Temp 98.6°F | Ht 75.0 in | Wt 272.0 lb

## 2021-06-17 DIAGNOSIS — R630 Anorexia: Secondary | ICD-10-CM | POA: Diagnosis not present

## 2021-06-17 DIAGNOSIS — F411 Generalized anxiety disorder: Secondary | ICD-10-CM

## 2021-06-17 DIAGNOSIS — R454 Irritability and anger: Secondary | ICD-10-CM

## 2021-06-17 DIAGNOSIS — F324 Major depressive disorder, single episode, in partial remission: Secondary | ICD-10-CM

## 2021-06-17 DIAGNOSIS — R5383 Other fatigue: Secondary | ICD-10-CM | POA: Diagnosis not present

## 2021-06-17 LAB — CBC WITH DIFFERENTIAL/PLATELET
Basophils Absolute: 0.1 10*3/uL (ref 0.0–0.1)
Basophils Relative: 0.8 % (ref 0.0–3.0)
Eosinophils Absolute: 0.2 10*3/uL (ref 0.0–0.7)
Eosinophils Relative: 2.9 % (ref 0.0–5.0)
HCT: 46.4 % (ref 39.0–52.0)
Hemoglobin: 15.7 g/dL (ref 13.0–17.0)
Lymphocytes Relative: 31.5 % (ref 12.0–46.0)
Lymphs Abs: 2.6 10*3/uL (ref 0.7–4.0)
MCHC: 33.9 g/dL (ref 30.0–36.0)
MCV: 90.7 fl (ref 78.0–100.0)
Monocytes Absolute: 0.6 10*3/uL (ref 0.1–1.0)
Monocytes Relative: 6.9 % (ref 3.0–12.0)
Neutro Abs: 4.8 10*3/uL (ref 1.4–7.7)
Neutrophils Relative %: 57.9 % (ref 43.0–77.0)
Platelets: 229 10*3/uL (ref 150.0–400.0)
RBC: 5.12 Mil/uL (ref 4.22–5.81)
RDW: 13.2 % (ref 11.5–15.5)
WBC: 8.3 10*3/uL (ref 4.0–10.5)

## 2021-06-17 LAB — COMPREHENSIVE METABOLIC PANEL
ALT: 23 U/L (ref 0–53)
AST: 19 U/L (ref 0–37)
Albumin: 4.7 g/dL (ref 3.5–5.2)
Alkaline Phosphatase: 108 U/L (ref 39–117)
BUN: 17 mg/dL (ref 6–23)
CO2: 28 mEq/L (ref 19–32)
Calcium: 9.8 mg/dL (ref 8.4–10.5)
Chloride: 104 mEq/L (ref 96–112)
Creatinine, Ser: 1.3 mg/dL (ref 0.40–1.50)
GFR: 61.51 mL/min (ref >60.00)
Glucose, Bld: 90 mg/dL (ref 70–99)
Potassium: 4.1 mEq/L (ref 3.5–5.1)
Sodium: 139 mEq/L (ref 135–145)
Total Bilirubin: 0.5 mg/dL (ref 0.2–1.2)
Total Protein: 7.6 g/dL (ref 6.0–8.3)

## 2021-06-17 LAB — VITAMIN B12: Vitamin B-12: 466 pg/mL (ref 211–911)

## 2021-06-17 LAB — TSH: TSH: 2.24 u[IU]/mL (ref 0.35–5.50)

## 2021-06-17 LAB — VITAMIN D 25 HYDROXY (VIT D DEFICIENCY, FRACTURES): VITD: 29.01 ng/mL — ABNORMAL LOW (ref 30.00–100.00)

## 2021-06-17 NOTE — Progress Notes (Signed)
? ?Subjective:  ? ? Patient ID: Jonathon Walker, male    DOB: December 16, 1965, 56 y.o.   MRN: [id number] ? ?HPI ?56 year old male who  has a past medical history of Anxiety, Bimalleolar fracture of left ankle, Depression, Heart murmur, and Sleep apnea. ? ?He presents to the office today for chronic fatigue, irritability, loss of appetite, unable to focus and " I feel hung over all time". His symptoms have been present for 6 months or so.  ? ?He denies fevers, chest pain, SOB, abdominal pain, nausea or vomiting.  ? ?He does have a history of sleep apnea and wears his CPAP nightly. Was seen by Pulmonary about three months ago.  ? ?He also has a significant history of anxiety and depression with many medication changes/alterations over the last few months. He is currently transitioning from Paxil  to Luvox. He is seen by psychiatry and does not feel as though his symptoms are well controlled.  ? ?He continues to go to [employer] and has stayed sober.  ? ?Wt Readings from Last 3 Encounters:  ?06/17/21 272 lb (123.4 kg)  ?03/25/21 277 lb (125.6 kg)  ?02/09/21 273 lb (123.8 kg)  ? ? ? ?Review of Systems ?See HPI  ? ?Past Medical History:  ?Diagnosis Date  ? Anxiety   ? Bimalleolar fracture of left ankle   ? Depression   ? Heart murmur   ? Sleep apnea   ? uses CPAP nightly  ? ? ?Social History  ? ?Socioeconomic History  ? Marital status: Single  ?  Spouse name: Not on file  ? Number of children: Not on file  ? Years of education: Not on file  ? Highest education level: Not on file  ?Occupational History  ? Not on file  ?Tobacco Use  ? Smoking status: Every Day  ?  Packs/day: 1.50  ?  Years: 0.00  ?  Pack years: 0.00  ?  Types: Cigarettes  ?  Start date: 03/22/1975  ? Smokeless tobacco: Never  ? Tobacco comments:  ?  He reports he has quit 3 times and recently started back in 2012. Hsm   ?Vaping Use  ? Vaping Use: Never used  ?Substance and Sexual Activity  ? Alcohol use: Not Currently  ? Drug use: No  ? Sexual activity:  Not on file  ?Other Topics Concern  ? Not on file  ?Social History Narrative  ? Not on file  ? ?Social Determinants of Health  ? ?Financial Resource Strain: Not on file  ?Food Insecurity: No Food Insecurity  ? Worried About Charity fundraiser in the Last Year: Never true  ? Ran Out of Food in the Last Year: Never true  ?Transportation Needs: Not on file  ?Physical Activity: Inactive  ? Days of Exercise per Week: 0 days  ? Minutes of Exercise per Session: 0 min  ?Stress: Stress Concern Present  ? Feeling of Stress : To some extent  ?Social Connections: Unknown  ? Frequency of Communication with Friends and Family: Twice a week  ? Frequency of Social Gatherings with Friends and Family: Twice a week  ? Attends Religious Services: Never  ? Active Member of Clubs or Organizations: No  ? Attends Archivist Meetings: Never  ? Marital Status: Patient refused  ?Intimate Partner Violence: Not At Risk  ? Fear of Current or Ex-Partner: No  ? Emotionally Abused: No  ? Physically Abused: No  ? Sexually Abused: No  ? ? ?  Past Surgical History:  ?Procedure Laterality Date  ? ORIF ANKLE FRACTURE Left 09/19/2017  ? Procedure: OPEN REDUCTION INTERNAL FIXATION (ORIF) ANKLE FRACTURE;  Surgeon: Renette Butters, MD;  Location: [facility];  Service: Orthopedics;  Laterality: Left;  ? TOOTH EXTRACTION    ? ? ?Family History  ?Problem Relation Age of Onset  ? Cancer Maternal Grandfather   ? Colon cancer Neg Hx   ? Colon polyps Neg Hx   ? Esophageal cancer Neg Hx   ? Rectal cancer Neg Hx   ? Stomach cancer Neg Hx   ? ? ?No Known Allergies ? ?Current Outpatient Medications on File Prior to Visit  ?Medication Sig Dispense Refill  ? amLODipine (NORVASC) 5 MG tablet Take daily 90 tablet 3  ? atorvastatin (LIPITOR) 40 MG tablet TAKE 1 TABLET(40 MG) BY MOUTH DAILY 90 tablet 3  ? Cyanocobalamin (VITAMIN B-12 PO) Take by mouth.    ? fluvoxaMINE (LUVOX) 100 MG tablet Take 2 tablets (200 mg total) by mouth at bedtime. 60 tablet  1  ? lisinopril (ZESTRIL) 20 MG tablet TAKE 1 TABLET(20 MG) BY MOUTH DAILY 90 tablet 3  ? lithium carbonate 300 MG capsule TAKE 4 CAPSULES(1200 MG) BY MOUTH AT BEDTIME (Patient taking differently: 3 capsules HS) 360 capsule 0  ? risperiDONE (RISPERDAL) 3 MG tablet Take 0.5 tablets (1.5 mg total) by mouth at bedtime. 15 tablet 1  ? ?No current facility-administered medications on file prior to visit.  ? ? ?BP 100/62   Pulse 65   Temp 98.6 ?F (37 ?C) (Oral)   Ht '6\' 3"'$  (1.905 m)   Wt 272 lb (123.4 kg)   SpO2 96%   BMI 34.00 kg/m?  ? ? ?   ?Objective:  ? Physical Exam ?Vitals and nursing note reviewed.  ?Constitutional:   ?   Appearance: Normal appearance. He is obese.  ?Cardiovascular:  ?   Rate and Rhythm: Normal rate and regular rhythm.  ?   Pulses: Normal pulses.  ?   Heart sounds: Normal heart sounds.  ?Pulmonary:  ?   Effort: Pulmonary effort is normal.  ?   Breath sounds: Normal breath sounds.  ?Musculoskeletal:     ?   General: Normal range of motion.  ?Skin: ?   General: Skin is warm and dry.  ?   Capillary Refill: Capillary refill takes less than 2 seconds.  ?Neurological:  ?   General: No focal deficit present.  ?   Mental Status: He is alert and oriented to person, place, and time.  ?Psychiatric:     ?   Mood and Affect: Mood normal.     ?   Behavior: Behavior normal.     ?   Thought Content: Thought content normal.     ?   Judgment: Judgment normal.  ? ?   ?Assessment & Plan:  ?1. Other fatigue ?- Will check labs including Testosterone, B12 and Vit D. Likely his severe anxiety/depression and meds he is currently on for this are the causative factor  ?- CBC with Differential/Platelet; Future ?- Comprehensive metabolic panel; Future ?- TSH; Future ?- Vitamin B12; Future ?- VITAMIN D 25 Hydroxy (Vit-D Deficiency, Fractures); Future ?- Testosterone,Free and Total; Future ?- Testosterone,Free and Total ?- VITAMIN D 25 Hydroxy (Vit-D Deficiency, Fractures) ?- Vitamin B12 ?- TSH ?- Comprehensive metabolic  panel ?- CBC with Differential/Platelet ? ?2. Loss of appetite ?3. Irritability ?4. GAD (generalized anxiety disorder) ?5. Major depressive disorder with single episode, in partial  remission ([place]) ? ? ?[name], NP ? ?

## 2021-06-21 LAB — TESTOSTERONE,FREE AND TOTAL
Testosterone, Free: 6.8 pg/mL — ABNORMAL LOW (ref 7.2–24.0)
Testosterone: 413 ng/dL (ref 264–916)

## 2021-06-24 ENCOUNTER — Telehealth: Payer: Self-pay | Admitting: Pharmacist

## 2021-06-24 NOTE — Chronic Care Management (AMB) (Signed)
? ? ?Chronic Care Management ?Pharmacy Assistant  ? ?Name: Jonathon Walker  MRN: [id number] DOB: 09/17/1965 ? ?Reason for Encounter: Disease State / Hypertension Assessment Call ?  ?Conditions to be addressed/monitored: ?HTN ? ?Recent office visits:  ?[name] NP - Patient was seen for other fatigue and additional issues. Discontinued Paroxetine. No follow up noted.  ? ?Recent consult visits:  ?06/01/2021 Trula Slade. MD (psychiatry) - Patient was seen for Major depressive disorder with single episode, in partial remission and additional issues. Started Luvox 200 mg daily. Discontinued Depakote. Follow up in 6 weeks. ? ?04/20/2021 Trula Slade. MD (psychiatry) - Patient was seen for Major depressive disorder with single episode, in partial remission and additional issues. Decreased Risperidone to 1.5 mg at bedtime. No follow up noted.  ? ?04/05/2021 Trula Slade. MD (psychiatry) - Patient was seen for Major depressive disorder with single episode, in partial remission and additional issues. Discontinued Modafinil. Follow up scheduled appointment.  ? ?Hospital visits:  ?None ? ?Medications: ?Outpatient Encounter Medications as of 06/24/2021  ?Medication Sig  ? amLODipine (NORVASC) 5 MG tablet Take daily  ? atorvastatin (LIPITOR) 40 MG tablet TAKE 1 TABLET(40 MG) BY MOUTH DAILY  ? Cyanocobalamin (VITAMIN B-12 PO) Take by mouth.  ? fluvoxaMINE (LUVOX) 100 MG tablet Take 2 tablets (200 mg total) by mouth at bedtime.  ? lisinopril (ZESTRIL) 20 MG tablet TAKE 1 TABLET(20 MG) BY MOUTH DAILY  ? lithium carbonate 300 MG capsule TAKE 4 CAPSULES(1200 MG) BY MOUTH AT BEDTIME (Patient taking differently: 3 capsules HS)  ? risperiDONE (RISPERDAL) 3 MG tablet Take 0.5 tablets (1.5 mg total) by mouth at bedtime.  ? ?No facility-administered encounter medications on file as of 06/24/2021.  ?Fill History: ?AMLODIPINE BESYLATE '5MG'$  TABLETS 05/24/2021 90  ? ?ATORVASTATIN '40MG'$  TABLETS 06/17/2021 90  ? ?FLUVOXAMINE '100MG'$   TABLETS 06/01/2021 30  ? ?LISINOPRIL  20 MG TABS 05/22/2021 12  ? ?LITHIUM CARBONATE '300MG'$  CAPSULES 05/16/2021 90  ? ?RISPERIDONE  3 MG TABS 05/22/2021 12  ? ?Reviewed chart prior to disease state call. Spoke with patient regarding BP ? ?Recent Office Vitals: ?BP Readings from Last 3 Encounters:  ?06/17/21 100/62  ?03/25/21 126/80  ?02/09/21 118/72  ? ?Pulse Readings from Last 3 Encounters:  ?06/17/21 65  ?03/25/21 78  ?02/09/21 85  ?  ?Wt Readings from Last 3 Encounters:  ?06/17/21 272 lb (123.4 kg)  ?03/25/21 277 lb (125.6 kg)  ?02/09/21 273 lb (123.8 kg)  ?  ? ?Kidney Function ?Lab Results  ?Component Value Date/Time  ? CREATININE 1.30 06/17/2021 01:50 PM  ? CREATININE 1.20 06/16/2020 01:42 PM  ? GFR 61.51 06/17/2021 01:50 PM  ? ? ? ?  Latest Ref Rng & Units 06/17/2021  ?  1:50 PM 06/16/2020  ?  1:42 PM 06/13/2019  ?  8:20 AM  ?BMP  ?Glucose 70 - 99 mg/dL 90   99   109    ?BUN 6 - 23 mg/dL '17   16   19    '$ ?Creatinine 0.40 - 1.50 mg/dL 1.30   1.20   1.14    ?Sodium 135 - 145 mEq/L 139   136   139    ?Potassium 3.5 - 5.1 mEq/L 4.1   4.4   4.2    ?Chloride 96 - 112 mEq/L 104   100   104    ?CO2 19 - 32 mEq/L '28   28   28    '$ ?Calcium 8.4 - 10.5 mg/dL 9.8  9.9   9.6    ? ? ?Current antihypertensive regimen:  ?Amlodipine 5 mg daily ?Lisinopril 20 mg daily ? ?How often are you checking your Blood Pressure? Patient states he is not checking his blood pressures and does not have a monitor. He states he just gets it checked at office visits and it is always fine.  ? ?Current home BP readings: Patient is not checking at home  ? ?What recent interventions/DTPs have been made by any provider to improve Blood Pressure control since last CPP Visit: No recent interventions.  ? ?Any recent hospitalizations or ED visits since last visit with CPP? No recent hospital visits ? ?What diet changes have been made to improve Blood Pressure Control?  ?Patient states states he eats what he wants ?Breakfast - patient does not eat ?Lunch -  patient will have a sandwich ?Dinner - patient will have a meal containing a meat and vegetable  ? ?What exercise is being done to improve your Blood Pressure Control?  ?Patient states due to his depression he does not exercise. ? ?Adherence Review: ?Is the patient currently on ACE/ARB medication? Yes ?Does the patient have >5 day gap between last estimated fill dates? No ? ?Care Gaps: ?AWV - completed on 07/28/20 ?Last BP - 100/62 on 06/17/2021 ?HIV screening - never done ?Zoster vaccines - never done ?Covid-19 vaccine - overdue ? ?Star Rating Drugs: ?Atorvastatin 40 mg - last filled 06/17/2021 90 DS at [facility] ?Lisinopril 20 mg - last filled 05/22/2021 12 DS at [facility] ? ?Gennie Alma CMA  ?Clinical Pharmacist Assistant ?[phone] ? ?

## 2021-06-29 ENCOUNTER — Ambulatory Visit: Payer: Medicare Other | Admitting: Psychiatry

## 2021-07-12 ENCOUNTER — Other Ambulatory Visit: Payer: Self-pay | Admitting: Psychiatry

## 2021-07-12 DIAGNOSIS — F324 Major depressive disorder, single episode, in partial remission: Secondary | ICD-10-CM

## 2021-07-12 NOTE — Telephone Encounter (Signed)
Patient has an appt with Dr. Clovis Pu on 4/26. ?

## 2021-07-14 ENCOUNTER — Encounter: Payer: Self-pay | Admitting: Psychiatry

## 2021-07-14 ENCOUNTER — Ambulatory Visit: Payer: Medicare Other | Admitting: Psychiatry

## 2021-07-14 DIAGNOSIS — F324 Major depressive disorder, single episode, in partial remission: Secondary | ICD-10-CM | POA: Diagnosis not present

## 2021-07-14 DIAGNOSIS — F411 Generalized anxiety disorder: Secondary | ICD-10-CM | POA: Diagnosis not present

## 2021-07-14 DIAGNOSIS — G4733 Obstructive sleep apnea (adult) (pediatric): Secondary | ICD-10-CM | POA: Diagnosis not present

## 2021-07-14 DIAGNOSIS — F1021 Alcohol dependence, in remission: Secondary | ICD-10-CM

## 2021-07-14 DIAGNOSIS — F422 Mixed obsessional thoughts and acts: Secondary | ICD-10-CM

## 2021-07-14 NOTE — Progress Notes (Signed)
Jonathon Walker ?[id number] ?1966-01-31 ?56 y.o. ? ?Subjective:  ? ?Patient ID:  Jonathon Walker is a 56 y.o. (DOB Jan 23, 1966) male. ? ?Chief Complaint:  ?Chief Complaint  ?Patient presents with  ? Follow-up  ? Depression  ? Anxiety  ? ? ?Depression ?       Associated symptoms include fatigue.  Associated symptoms include no decreased concentration and no suicidal ideas.  Past medical history includes anxiety.   ?Anxiety ?Symptoms include nervous/anxious behavior. Patient reports no chest pain, confusion, decreased concentration or suicidal ideas.  ? ?   ?Jonathon Walker presents to the office today for follow-up of anxiety , depression, alcohol.   ? ?seen August 7.  His anxiety was unmanaged at Paxil 40 mg and olanzapine 5 mg so olanzapine was increased to 7.5 daily. ? ?seen December 17, 2018.  His anxiety was unmanaged.  The following change was made: ?Trial 1-1/2 of the 7.5 mg tablets of olanzapine for anxiety. ? ?He is had a stay at [facility] for alcohol dependence since he was last here. Stayed 28 days and DC before Thanksgiving.  Very helpful.  Was having NM nighly before and they stopped since then.  Not drinking.  Involved in AA since then. ? ?seen March 25, 2019.  The patient was doing better requested a reduction in olanzapine to 7.5 mg nightly to improve energy. ? ?visit April 03, 2019.  He had remained sober and his anxiety was improved with sobriety.  He was having problems with low motivation and forgetfulness.  At the next refill it was decided olanzapine would be reduced from 7.5 to 5 mg daily. He called requesting this urgent appointment because of worsening symptoms associated with returning to work. ? ?seen May 21, 2019.  He was doing relatively well.  The following was noted: ?Tolerating Wellbutrin and it seems to help. He didn't tolerate the increase. ?Anxiety is improved off alcohol.  Ok to reduce olanzapine to 2.5 mg daily for a month and stop it.  Hopefully low motivation will  improve.  ? ?He called back March 23 stating he was more depressed and having a harder time doing routine tasks and having suicidal thoughts.  Because of worsening depression after reducing the olanzapine he was encouraged to increase olanzapine back to 5 mg nightly. ?He called again March 24 stating he was really struggling but was scheduled for an appointment on March 26. ? ?As of June 14, 2019 he reports the following: ?Every day a real hard struggle to get through the days.  More anxious and depressed equally. Worse 2 weeks.  Last Saturday so overwhelmed by how he feels he had SI.  Everything is hard. Initial benefit paroxetine has been lost.  Awakens stressed and tired. Adequate hours of sleep.  Will be major chore just to mow grass.  Still sober. ?SE sexual. ?Getting appt with CPAP doc. ?Changes made include: Increasing olanzapine back to 5 mg daily a couple of days prior to the appointment due to phone call and the addition of lithium 300 mg for a few days then increase to 600 mg nightly both to augment the antidepressant and because of suicidal thoughts. ? ?July 15, 2019 appointment, the following is noted: ?Doing OK with both depression and anxiety.  Kind of like a top always going in mind.  Even when I sleep, when awakens mind still going.  NM nightly for years but less than before stopped drinking.  Smaller and less severe ones that don't wake him.  Theme can't finish things.  Both depression and anxiety 3/10.  More productive.  Paces himself.  Can live with it.  Sleep 9 hours.  Initially olanzapine stopped the ruminating but it's back some.  Overall still benefit.  No SI and can't rmember why he had them before. Residual energy not great and some ruminating. ?He had some concerns about sexual side effects from Paxil but agreed that no med changes were necessary despite residual symptoms of depression and anxiety. ? ?08/14/2019 appointment, the following is noted:  He scheduled urgently. ?Vaccinated.  Angry easily and exhausted and everything is a chore.  Exhausted. ?Never had appt with CPAP company.  CPAP is 57 years old and on same settings as in the past.  CPAP machine is not working properly but when it did it was helpful for alertness and energy.  Disc need to call them to have it evaluated. ?To bed 10-7.  Don't feel rested in morning but used to feel rested when first started paroxetine. ?Plan: Increase Lithium continue to 900 mg daily for irritability ? ?11/19/19 appt with the following noted: ?He increased to 900 mg and then reduced it but doesn't know why.  Reduced it to 300 nightly. ?Irritability much better.  Once anger since here with rage but not outward. ?Still on paroxetine 40, olanzapine 5, Wellbutrin 75 AM. ?Getting tired in afternoon and worrying about getting depressed but he's not depressed now.  Don't enjoy things like he used to.  Les interest and excitement. ?Appetite and sleep are normal.   ?Still adjusting without alcohol and socialization. ?Worrying about lack of sex drive with paroxetine. ?Can still ruminate on things until he gets some reassurance through talking with people. ?Plan: Continue low-dose lithium 300 mg daily ?Continue Paxil.  Did not feel well at 60 mg so we will continue 40 mg.  Likely to lose anxiety benefit if change it. ?Continue low-dose Wellbutrin 75  Tolerating Wellbutrin and it seems to help. He didn't tolerate the increase.   ?Continue olanzapine 5 mg ? ?02/18/2020 appointment with following noted: ?Open to trying increase paroxetine again bc ruminating wears him out and gets fatigued and it wears him out though is 80% better vs before paroxetine. Doesn't remember trying higher paroxetine.  Ruminates on relationships or work.  Whenever he can talk about it with someone it helps tremendously but also realizes paroxetine helps. ?Anxiety affects dreams and NM too.  Up and down a little.  Anxiety drove depression before paroxetine.  Best med he's tried. ?SOBER 1  YEAR ?SE no problem.  Except gained 25# in 4 years.  No change in diet and exercise.  No increase in appetite and no food cravings. ? Seeing PCP soon. ?Plan: Increase paroxetine trial to 60 for rumination.  Disc SE. ? ?05/26/20 appt noted: ?No benefit increase paroxetine nor SE.  ?Wants to reduce back to 30 mg paroxetine and stop lithium bc it didn't seem to help.   ?4 days of Calm app has seemed to help rumination.  Does it in afternoon and before bed and it seemed to helps.  Doing a 10 min meditation and it helps.   ?Dep 4/10.  Anxiety 4/10. And a lot better the last few days.   ?Plan: Reduce lithium by 1 tablet per week.  Call if there is any suicidal thought. ?Starting April 1 reduce paroxetine to 40 mg daily. ?Continue low-dose Wellbutrin 75  Tolerating Wellbutrin and it seems to help. He didn't tolerate the increase.   If doing  OK will try taper as he suggested at next visit. ?Continue olanzapine 5 mg nightly it was helpful for depression and anxiety. ? ?07/01/2020 appt with following noted: ?Moved appt up. ?Now says he understated depression level when he was here the last time. ?Restarted lithium bc felt more depressed without it. ?Reduced paroxetine to 40 mg daily. ?Lethargic.  Mind won't calm down.  Nonrestorative sleep but 8 hours.  Doesn't think sleep is deep enough.  Tense. ?Goes to [employer]. Started counseling. ?Wants to try something different with meds. ?Can have mixed sx for 6 weeks with increased interest in things and motivation and want to spend money but still feels depressed.  3 times in the last year. ?Plan: Increase  lithium back to 3 daily to see if depression is better and less mood cycling.  He admits to feeling worse when he stopped the lithium ?Continue the reduce paroxetine to 45 mg daily. ?DC Wellbutrin ?Lamotrigine trial 25 mg to 100 mg daily over 4 weeks. ?Continue olanzapine 5 mg nightly it was helpful for depression and anxiety. ? ?08/26/2020 appointment with the following  noted: ?Sad and down.  Classic depression sx and very fatigued.  Unusual to have classic depression bc usually is atypical.  No effect from lamotrigine. ?Wonders if atorvastatin is causing some confusion or depression

## 2021-07-27 ENCOUNTER — Other Ambulatory Visit: Payer: Self-pay | Admitting: Psychiatry

## 2021-07-27 ENCOUNTER — Telehealth: Payer: Self-pay | Admitting: Psychiatry

## 2021-07-27 NOTE — Telephone Encounter (Signed)
Have him try Vraylar 3 mg daily for 2 weeks and if no improvement stop it.  He has a coupon and we can send in RX for 3 mg Vraylar #30, no refills; if he agrees. ?

## 2021-07-27 NOTE — Telephone Encounter (Signed)
Pt LVM @ 2:44p.  He says the Vraylar that Dr Clovis Pu gave him samples of does not seem to provided much, if any, improvement for him.  He is still having a hard time every day with depression.  He wants to know if Dr. Clovis Pu still want him to get a script for it or try something else. ? ?Next appt 6/12 ? ?

## 2021-07-27 NOTE — Telephone Encounter (Signed)
Patient said he started Vraylar 4/27 and has 2 tablets left.  He said he had no SE that he is aware of. He said he is irritable, but was that way before Vraylar. He is taking Luvox and lithium, not taking Risperdal.  ?

## 2021-07-28 ENCOUNTER — Other Ambulatory Visit: Payer: Self-pay

## 2021-07-28 ENCOUNTER — Telehealth: Payer: Self-pay

## 2021-07-28 MED ORDER — CARIPRAZINE HCL 3 MG PO CAPS: 3.0000 mg | ORAL_CAPSULE | Freq: Every day | ORAL | 0 refills | Status: DC

## 2021-07-28 NOTE — Telephone Encounter (Signed)
Prior Authorization submitted and approved for VRAYLAR 3 MG effective through 03/20/2022, PA# [id number] with [employer] ?

## 2021-07-28 NOTE — Telephone Encounter (Signed)
Patient agreeable to trying 3 mg. Rx sent.  ?

## 2021-07-29 ENCOUNTER — Telehealth: Payer: Self-pay | Admitting: Psychiatry

## 2021-07-29 ENCOUNTER — Ambulatory Visit (INDEPENDENT_AMBULATORY_CARE_PROVIDER_SITE_OTHER): Payer: Medicare Other

## 2021-07-29 VITALS — Ht 75.0 in | Wt 272.0 lb

## 2021-07-29 DIAGNOSIS — Z0000 Encounter for general adult medical examination without abnormal findings: Secondary | ICD-10-CM | POA: Diagnosis not present

## 2021-07-29 NOTE — Progress Notes (Signed)
? ?Subjective:  ? Jonathon Walker is a 56 y.o. male who presents for Medicare Annual/Subsequent preventive examination. ? ?Review of Systems    ?Virtual Visit via Telephone Note ? ?I connected with  Jonathon Walker on 07/29/21 at  3:30 PM EDT by telephone and verified that I am speaking with the correct person using two identifiers. ? ?Location: ?Patient: Home ?Provider: Office ?Persons participating in the virtual visit: patient/Nurse Health Advisor ?  ?I discussed the limitations, risks, security and privacy concerns of performing an evaluation and management service by telephone and the availability of in person appointments. The patient expressed understanding and agreed to proceed. ? ?Interactive audio and video telecommunications were attempted between this nurse and patient, however failed, due to patient having technical difficulties OR patient did not have access to video capability.  We continued and completed visit with audio only. ? ?Some vital signs may be absent or patient reported.  ? ?Criselda Peaches, LPN  ?Cardiac Risk Factors include: advanced age (>91mn, >>77women);male gender;smoking/ tobacco exposure ? ?   ?Objective:  ?  ?Today's Vitals  ? 07/29/21 1537  ?Weight: 272 lb (123.4 kg)  ?Height: '6\' 3"'$  (1.905 m)  ? ?Body mass index is 34 kg/m?. ? ? ?  07/29/2021  ?  3:52 PM 07/28/2020  ?  2:04 PM 09/19/2017  ?  7:38 AM 09/13/2017  ? 12:44 PM  ?Advanced Directives  ?Does Patient Have a Medical Advance Directive? Yes Yes Yes Yes  ?Type of AParamedicof A[place]daLiving will [place] AF[place]iving will [place]Living will [place]iday IslandLiving will  ?Does patient want to make changes to medical advance directive? No - Patient declined No - Patient declined No - Patient declined No - Patient declined  ?Copy of [place]Lock [place]in Chart? No - copy requested No - copy requested No - copy requested   ? ? ?Current Medications  (verified) ?Outpatient Encounter Medications as of 07/29/2021  ?Medication Sig  ? atorvastatin (LIPITOR) 40 MG tablet TAKE 1 TABLET(40 MG) BY MOUTH DAILY  ? amLODipine (NORVASC) 5 MG tablet Take daily  ? cariprazine (VRAYLAR) 3 MG capsule Take 1 capsule (3 mg total) by mouth daily.  ? Cyanocobalamin (VITAMIN B-12 PO) Take by mouth.  ? fluvoxaMINE (LUVOX) 100 MG tablet Take 2 tablets (200 mg total) by mouth at bedtime.  ? lisinopril (ZESTRIL) 20 MG tablet TAKE 1 TABLET(20 MG) BY MOUTH DAILY (Patient not taking: Reported on 07/29/2021)  ? lithium carbonate 300 MG capsule Take 3 capsules (900 mg total) by mouth at bedtime. 3 capsules HS  ? risperiDONE (RISPERDAL) 3 MG tablet Take 0.5 tablets (1.5 mg total) by mouth at bedtime. (Patient not taking: Reported on 07/14/2021)  ? ?No facility-administered encounter medications on file as of 07/29/2021.  ? ? ?Allergies (verified) ?Patient has no known allergies.  ? ?History: ?Past Medical History:  ?Diagnosis Date  ? Anxiety   ? Bimalleolar fracture of left ankle   ? Depression   ? Heart murmur   ? Sleep apnea   ? uses CPAP nightly  ? ?Past Surgical History:  ?Procedure Laterality Date  ? ORIF ANKLE FRACTURE Left 09/19/2017  ? Procedure: OPEN REDUCTION INTERNAL FIXATION (ORIF) ANKLE FRACTURE;  Surgeon: MRenette Butters MD;  [facility]  Service: [facility]opedics;  Laterality: Left;  ? TOOTH EXTRACTION    ? ?Family History  ?Problem Relation Age of Onset  ? Cancer Maternal Grandfather   ?  Colon cancer Neg Hx   ? Colon polyps Neg Hx   ? Esophageal cancer Neg Hx   ? Rectal cancer Neg Hx   ? Stomach cancer Neg Hx   ? ?Social History  ? ?Socioeconomic History  ? Marital status: Single  ?  Spouse name: Not on file  ? Number of children: Not on file  ? Years of education: Not on file  ? Highest education level: Not on file  ?Occupational History  ? Not on file  ?Tobacco Use  ? Smoking status: Every Day  ?  Packs/day: 1.50  ?  Years: 0.00  ?  Pack years: 0.00  ?   Types: Cigarettes  ?  Start date: 03/22/1975  ? Smokeless tobacco: Never  ? Tobacco comments:  ?  He reports he has quit 3 times and recently started back in 2012. Hsm   ?Vaping Use  ? Vaping Use: Never used  ?Substance and Sexual Activity  ? Alcohol use: Not Currently  ? Drug use: No  ? Sexual activity: Not on file  ?Other Topics Concern  ? Not on file  ?Social History Narrative  ? Not on file  ? ?Social Determinants of Health  ? ?Financial Resource Strain: Low Risk   ? Difficulty of Paying Living Expenses: Not hard at all  ?Food Insecurity: No Food Insecurity  ? Worried About Charity fundraiser in the Last Year: Never true  ? Ran Out of Food in the Last Year: Never true  ?Transportation Needs: No Transportation Needs  ? Lack of Transportation (Medical): No  ? Lack of Transportation (Non-Medical): No  ?Physical Activity: Inactive  ? Days of Exercise per Week: 0 days  ? Minutes of Exercise per Session: 0 min  ?Stress: Stress Concern Present  ? Feeling of Stress : Rather much  ?Social Connections: Moderately Isolated  ? Frequency of Communication with Friends and Family: More than three times a week  ? Frequency of Social Gatherings with Friends and Family: More than three times a week  ? Attends Religious Services: Never  ? Active Member of Clubs or Organizations: Yes  ? Attends Archivist Meetings: More than 4 times per year  ? Marital Status: Never married  ? ? ?Tobacco Counseling ?Ready to quit: No ?Counseling given: Yes ?Tobacco comments: He reports he has quit 3 times and recently started back in 2012. Hsm  ? ? ?Clinical Intake: ?How often do you need to have someone help you when you read instructions, pamphlets, or other written materials from your doctor or pharmacy?: 1 - Never ? ?Diabetic?  No ? ?Activities of Daily Living ? ?  07/29/2021  ?  3:49 PM  ?In your present state of health, do you have any difficulty performing the following activities:  ?Hearing? 0  ?Vision? 0  ?Difficulty concentrating  or making decisions? 1  ?Comment Followed by Dr Clovis Pu  ?Walking or climbing stairs? 0  ?Dressing or bathing? 0  ?Doing errands, shopping? 0  ?Preparing Food and eating ? N  ?Using the Toilet? N  ?In the past six months, have you accidently leaked urine? N  ?Do you have problems with loss of bowel control? N  ?Managing your Medications? N  ?Managing your Finances? N  ?Housekeeping or managing your Housekeeping? N  ? ? ?Patient Care Team: ?[name], NP as PCP - General (Family Medicine) ?Jettie Booze, MD as PCP - Cardiology (Cardiology) ?Cottle, Billey Co., MD as Attending Physician (Psychiatry) ?Knowlton, [place],  MD as Consulting Physician (Cardiology) ?Viona Gilmore, [facility] as Pharmacist (Pharmacist) ? ?Indicate any recent Medical Services you may have received from other than [facility] providers in the past year (date may be approximate). ? ?   ?Assessment:  ? This is a routine wellness examination for Jessen. ? ?Hearing/Vision screen ?Hearing Screening - Comments:: No hearing difficulty ?Vision Screening - Comments:: Wears reading glasses ? ?Dietary issues and exercise activities discussed: ?Exercise limited by: None identified ? ? Goals Addressed   ? ?  ?  ?  ?  ?  ? This Visit's Progress  ?   Patient stated (pt-stated)     ?   I want to get healthy with depression ?  ? ?  ? ?Depression Screen ? ?  07/29/2021  ?  3:45 PM 06/17/2021  ?  2:54 PM 07/28/2020  ?  2:05 PM  ?PHQ 2/9 Scores  ?PHQ - 2 Score '4 6 3  '$ ?PHQ- 9 Score '12 18 9  '$ ?  ?Fall Risk ? ?  07/29/2021  ?  3:50 PM 07/28/2020  ?  2:05 PM  ?Fall Risk   ?Falls in the past year? 0 0  ?Number falls in past yr: 0 0  ?Injury with Fall? 0 0  ?Risk for fall due to : No Fall Risks   ?Follow up  Falls evaluation completed  ? ? ?FALL RISK PREVENTION PERTAINING TO THE HOME: ? ?Any stairs in or around the home? No  ?If so, are there any without handrails? No  ?Home free of loose throw rugs in walkways, pet beds, electrical cords, etc? Yes  ?Adequate lighting in  your home to reduce risk of falls? Yes  ? ?ASSISTIVE DEVICES UTILIZED TO PREVENT FALLS: ? ?Life alert? No  ?Use of a cane, walker or w/c? No  ?Grab bars in the bathroom? No  ?Shower chair or bench in shower

## 2021-07-29 NOTE — Telephone Encounter (Signed)
I pulled 3 boxes of the 3 mg of vraylar. I will call  him to come pick them up ?

## 2021-07-29 NOTE — Telephone Encounter (Signed)
Pt called at 10:40 am and said that the vraylar 3 mg script that was sent in will cost him over 400 dollars. The coupon cards that he was given will not work because he has medicare. He would like to have samples of the 3 mg while we try a PA on the vraylar. He can pick them up today. Please call him at [phone] ?

## 2021-07-29 NOTE — Patient Instructions (Addendum)
?Mr. Wirt , ?Thank you for taking time to come for your Medicare Wellness Visit. I appreciate your ongoing commitment to your health goals. Please review the following plan we discussed and let me know if I can assist you in the future.  ? ?These are the goals we discussed: ? Goals   ? ?   Patient stated (pt-stated)   ?   I want to get healthy with depression ?  ? ?  ?  ?This is a list of the screening recommended for you and due dates:  ?Health Maintenance  ?Topic Date Due  ? COVID-19 Vaccine (4 - Booster for Pfizer series) 08/14/2021*  ? Zoster (Shingles) Vaccine (1 of 2) 10/29/2021*  ? HIV Screening  07/30/2022*  ? Flu Shot  10/19/2021  ? Colon Cancer Screening  08/30/2022  ? Tetanus Vaccine  06/06/2029  ? Hepatitis C Screening: USPSTF Recommendation to screen - Ages 25-79 yo.  Completed  ? HPV Vaccine  Aged Out  ?*Topic was postponed. The date shown is not the original due date.  ? ?Advanced directives: Yes Patient will submit copy ? ?Conditions/risks identified: None ? ?Next appointment: Follow up in one year for your annual wellness visit  ? ?Preventive Care 40-64 Years, Male ?Preventive care refers to lifestyle choices and visits with your health care provider that can promote health and wellness. ?What does preventive care include? ?A yearly physical exam. This is also called an annual well check. ?Dental exams once or twice a year. ?Routine eye exams. Ask your health care provider how often you should have your eyes checked. ?Personal lifestyle choices, including: ?Daily care of your teeth and gums. ?Regular physical activity. ?Eating a healthy diet. ?Avoiding tobacco and drug use. ?Limiting alcohol use. ?Practicing safe sex. ?Taking low-dose aspirin every day starting at age 68. ?What happens during an annual well check? ?The services and screenings done by your health care provider during your annual well check will depend on your age, overall health, lifestyle risk factors, and family history of  disease. ?Counseling  ?Your health care provider may ask you questions about your: ?Alcohol use. ?Tobacco use. ?Drug use. ?Emotional well-being. ?Home and relationship well-being. ?Sexual activity. ?Eating habits. ?Work and work Statistician. ?Screening  ?You may have the following tests or measurements: ?Height, weight, and BMI. ?Blood pressure. ?Lipid and cholesterol levels. These may be checked every 5 years, or more frequently if you are over 77 years old. ?Skin check. ?Lung cancer screening. You may have this screening every year starting at age 13 if you have a 30-pack-year history of smoking and currently smoke or have quit within the past 15 years. ?Fecal occult blood test (FOBT) of the stool. You may have this test every year starting at age 51. ?Flexible sigmoidoscopy or colonoscopy. You may have a sigmoidoscopy every 5 years or a colonoscopy every 10 years starting at age 7. ?Prostate cancer screening. Recommendations will vary depending on your family history and other risks. ?Hepatitis C blood test. ?Hepatitis B blood test. ?Sexually transmitted disease (STD) testing. ?Diabetes screening. This is done by checking your blood sugar (glucose) after you have not eaten for a while (fasting). You may have this done every 1-3 years. ?Discuss your test results, treatment options, and if necessary, the need for more tests with your health care provider. ?Vaccines  ?Your health care provider may recommend certain vaccines, such as: ?Influenza vaccine. This is recommended every year. ?Tetanus, diphtheria, and acellular pertussis (Tdap, Td) vaccine. You may need a Td  booster every 10 years. ?Zoster vaccine. You may need this after age 78. ?Pneumococcal 13-valent conjugate (PCV13) vaccine. You may need this if you have certain conditions and have not been vaccinated. ?Pneumococcal polysaccharide (PPSV23) vaccine. You may need one or two doses if you smoke cigarettes or if you have certain conditions. ?Talk to your  health care provider about which screenings and vaccines you need and how often you need them. ?This information is not intended to replace advice given to you by your health care provider. Make sure you discuss any questions you have with your health care provider. ?Document Released: 04/03/2015 Document Revised: 11/25/2015 Document Reviewed: 01/06/2015 ?Elsevier Interactive Patient Education ? 2017 [place]. ? ?Fall Prevention in the Home ?Falls can cause injuries. They can happen to people of all ages. There are many things you can do to make your home safe and to help prevent falls. ?What can I do on the outside of my home? ?Regularly fix the edges of walkways and driveways and fix any cracks. ?Remove anything that might make you trip as you walk through a door, such as a raised step or threshold. ?Trim any bushes or trees on the path to your home. ?Use bright outdoor lighting. ?Clear any walking paths of anything that might make someone trip, such as rocks or tools. ?Regularly check to see if handrails are loose or broken. Make sure that both sides of any steps have handrails. ?Any raised decks and porches should have guardrails on the edges. ?Have any leaves, snow, or ice cleared regularly. ?Use sand or salt on walking paths during winter. ?Clean up any spills in your garage right away. This includes oil or grease spills. ?What can I do in the bathroom? ?Use night lights. ?Install grab bars by the toilet and in the tub and shower. Do not use towel bars as grab bars. ?Use non-skid mats or decals in the tub or shower. ?If you need to sit down in the shower, use a plastic, non-slip stool. ?Keep the floor dry. Clean up any water that spills on the floor as soon as it happens. ?Remove soap buildup in the tub or shower regularly. ?Attach bath mats securely with double-sided non-slip rug tape. ?Do not have throw rugs and other things on the floor that can make you trip. ?What can I do in the bedroom? ?Use night  lights. ?Make sure that you have a light by your bed that is easy to reach. ?Do not use any sheets or blankets that are too big for your bed. They should not hang down onto the floor. ?Have a firm chair that has side arms. You can use this for support while you get dressed. ?Do not have throw rugs and other things on the floor that can make you trip. ?What can I do in the kitchen? ?Clean up any spills right away. ?Avoid walking on wet floors. ?Keep items that you use a lot in easy-to-reach places. ?If you need to reach something above you, use a strong step stool that has a grab bar. ?Keep electrical cords out of the way. ?Do not use floor polish or wax that makes floors slippery. If you must use wax, use non-skid floor wax. ?Do not have throw rugs and other things on the floor that can make you trip. ?What can I do with my stairs? ?Do not leave any items on the stairs. ?Make sure that there are handrails on both sides of the stairs and use them. Fix handrails that  are broken or loose. Make sure that handrails are as long as the stairways. ?Check any carpeting to make sure that it is firmly attached to the stairs. Fix any carpet that is loose or worn. ?Avoid having throw rugs at the top or bottom of the stairs. If you do have throw rugs, attach them to the floor with carpet tape. ?Make sure that you have a light switch at the top of the stairs and the bottom of the stairs. If you do not have them, ask someone to add them for you. ?What else can I do to help prevent falls? ?Wear shoes that: ?Do not have high heels. ?Have rubber bottoms. ?Are comfortable and fit you well. ?Are closed at the toe. Do not wear sandals. ?If you use a stepladder: ?Make sure that it is fully opened. Do not climb a closed stepladder. ?Make sure that both sides of the stepladder are locked into place. ?Ask someone to hold it for you, if possible. ?Clearly mark and make sure that you can see: ?Any grab bars or handrails. ?First and last  steps. ?Where the edge of each step is. ?Use tools that help you move around (mobility aids) if they are needed. These include: ?Canes. ?Walkers. ?Scooters. ?Crutches. ?Turn on the lights when you go into a dark

## 2021-08-03 ENCOUNTER — Telehealth: Payer: Medicare Other | Admitting: Adult Health

## 2021-08-03 ENCOUNTER — Encounter: Payer: Self-pay | Admitting: Adult Health

## 2021-08-04 ENCOUNTER — Ambulatory Visit: Payer: Medicare Other | Admitting: Pulmonary Disease

## 2021-08-04 ENCOUNTER — Encounter: Payer: Self-pay | Admitting: Pulmonary Disease

## 2021-08-04 VITALS — BP 124/76 | HR 77 | Temp 97.8°F | Ht 75.0 in | Wt 266.8 lb

## 2021-08-04 DIAGNOSIS — G4733 Obstructive sleep apnea (adult) (pediatric): Secondary | ICD-10-CM | POA: Diagnosis not present

## 2021-08-04 DIAGNOSIS — Z9989 Dependence on other enabling machines and devices: Secondary | ICD-10-CM | POA: Diagnosis not present

## 2021-08-04 NOTE — Patient Instructions (Addendum)
Continue using the CPAP nightly ? ?Machine is working very well with optimum control of events ? ?As we discussed you may need adjustment of your medications for your anxiety ? ?I will see you a year from now ?

## 2021-08-04 NOTE — Progress Notes (Signed)
Erroneous entry

## 2021-08-04 NOTE — Progress Notes (Signed)
? ?      ?Jonathon Walker    [id number]    [birth year] ? ?Primary Care Physician:Nafziger, Tommi Rumps, NP ? ?Referring Physician: [name], NP ?[place] ?[place],  [place] [address] ? ?Chief complaint:   ?Patient with a history of obstructive sleep apnea ?In for follow-up ? ?HPI: ? ?Has been having episodes of inability to fall asleep ?Feels breathing may not be right while on CPAP ? ?Compliance from his CPAP was reviewed with him ?-This shows excellent compliance with CPAP ?-AHI of 0.8 ? ?Symptoms overall are better apart from difficulty at sleep onset ?Feels pressure may need adjusted however current download shows that he is optimally treated ? ?Diagnosed with obstructive sleep apnea a few years ago ?Uses CPAP nightly ? ?Continues to use CPAP nightly ?No change in symptoms ? ?Machine is set between 6 and 20 ?Gets about 7 hours 50 minutes of sleep at night  ? ?Supplies obtained from [employer] ? ?He does not recall any of his medications have not changed recently-some of his medications do cause fatigue ? ?He has generalized anxiety/depression ?On multiple medications that may contribute to daytime sleepiness ? ?Usually goes to bed about 10, may take him about half an hour to fall asleep ?Several awakenings ?Final wake up time about 7 AM ? ?Weight is up about 15 pounds ? ?Is very tired during the day but not able to sleep during the day ?He sleeps fairly well at night but not continuously and this has been his norm ? ?He has hypertension, cardiac rhythm problems ? ?He smokes about 2 packs a day ? ? ?Outpatient Encounter Medications as of 08/04/2021  ?Medication Sig  ? amLODipine (NORVASC) 5 MG tablet Take daily  ? atorvastatin (LIPITOR) 40 MG tablet TAKE 1 TABLET(40 MG) BY MOUTH DAILY  ? cariprazine (VRAYLAR) 3 MG capsule Take 1 capsule (3 mg total) by mouth daily.  ? Cyanocobalamin (VITAMIN B-12 PO) Take by mouth.  ? fluvoxaMINE (LUVOX) 100 MG tablet Take 2 tablets (200 mg total) by mouth at  bedtime.  ? lisinopril (ZESTRIL) 20 MG tablet TAKE 1 TABLET(20 MG) BY MOUTH DAILY  ? lithium carbonate 300 MG capsule Take 3 capsules (900 mg total) by mouth at bedtime. 3 capsules HS  ? risperiDONE (RISPERDAL) 3 MG tablet Take 0.5 tablets (1.5 mg total) by mouth at bedtime.  ? ?No facility-administered encounter medications on file as of 08/04/2021.  ? ? ?Allergies as of 08/04/2021  ? (No Known Allergies)  ? ? ?Past Medical History:  ?Diagnosis Date  ? Anxiety   ? Bimalleolar fracture of left ankle   ? Depression   ? Heart murmur   ? Sleep apnea   ? uses CPAP nightly  ? ? ?Past Surgical History:  ?Procedure Laterality Date  ? ORIF ANKLE FRACTURE Left 09/19/2017  ? Procedure: OPEN REDUCTION INTERNAL FIXATION (ORIF) ANKLE FRACTURE;  Surgeon: Renette Butters, MD;  Location: [facility];  Service: Orthopedics;  Laterality: Left;  ? TOOTH EXTRACTION    ? ? ?Family History  ?Problem Relation Age of Onset  ? Cancer Maternal Grandfather   ? Colon cancer Neg Hx   ? Colon polyps Neg Hx   ? Esophageal cancer Neg Hx   ? Rectal cancer Neg Hx   ? Stomach cancer Neg Hx   ? ? ?Social History  ? ?Socioeconomic History  ? Marital status: Single  ?  Spouse name: Not on file  ? Number of children: Not on  file  ? Years of education: Not on file  ? Highest education level: Not on file  ?Occupational History  ? Not on file  ?Tobacco Use  ? Smoking status: Every Day  ?  Packs/day: 1.50  ?  Years: 0.00  ?  Pack years: 0.00  ?  Types: Cigarettes  ?  Start date: 03/22/1975  ? Smokeless tobacco: Never  ? Tobacco comments:  ?  He reports he has quit 3 times and recently started back in 2012. Hsm   ?Vaping Use  ? Vaping Use: Never used  ?Substance and Sexual Activity  ? Alcohol use: Not Currently  ? Drug use: No  ? Sexual activity: Not on file  ?Other Topics Concern  ? Not on file  ?Social History Narrative  ? Not on file  ? ?Social Determinants of Health  ? ?Financial Resource Strain: Low Risk   ? Difficulty of Paying Living  Expenses: Not hard at all  ?Food Insecurity: No Food Insecurity  ? Worried About Charity fundraiser in the Last Year: Never true  ? Ran Out of Food in the Last Year: Never true  ?Transportation Needs: No Transportation Needs  ? Lack of Transportation (Medical): No  ? Lack of Transportation (Non-Medical): No  ?Physical Activity: Inactive  ? Days of Exercise per Week: 0 days  ? Minutes of Exercise per Session: 0 min  ?Stress: Stress Concern Present  ? Feeling of Stress : Rather much  ?Social Connections: Moderately Isolated  ? Frequency of Communication with Friends and Family: More than three times a week  ? Frequency of Social Gatherings with Friends and Family: More than three times a week  ? Attends Religious Services: Never  ? Active Member of Clubs or Organizations: Yes  ? Attends Archivist Meetings: More than 4 times per year  ? Marital Status: Never married  ?Intimate Partner Violence: Not At Risk  ? Fear of Current or Ex-Partner: No  ? Emotionally Abused: No  ? Physically Abused: No  ? Sexually Abused: No  ? ? ?Review of Systems  ?Respiratory:  Positive for apnea. Negative for shortness of breath.   ?Psychiatric/Behavioral:  Positive for sleep disturbance.   ? ?Vitals:  ? 08/04/21 1453  ?BP: 124/76  ?Pulse: 77  ?Temp: 97.8 ?F (36.6 ?C)  ?SpO2: 98%  ? ? ? ?Physical Exam ?Constitutional:   ?   Appearance: He is obese.  ?HENT:  ?   Head: Normocephalic.  ?   Mouth/Throat:  ?   Mouth: Mucous membranes are moist.  ?   Comments: Crowded oropharynx, Mallampati 3, macroglossia ?Eyes:  ?   Pupils: Pupils are equal, round, and reactive to light.  ?Cardiovascular:  ?   Rate and Rhythm: Normal rate and regular rhythm.  ?   Heart sounds: No murmur heard. ?  No friction rub.  ?Pulmonary:  ?   Effort: No respiratory distress.  ?   Breath sounds: No stridor. No wheezing or rhonchi.  ?Musculoskeletal:  ?   Cervical back: No rigidity or tenderness.  ?Neurological:  ?   Mental Status: He is alert.  ?Psychiatric:      ?   Mood and Affect: Mood normal.  ? ? ?  02/09/2021  ?  3:00 PM  ?Results of the Epworth flowsheet  ?Sitting and reading 1  ?Watching TV 1  ?Sitting, inactive in a public place (e.g. a theatre or a meeting) 0  ?As a passenger in a car for an hour without  a break 0  ?Lying down to rest in the afternoon when circumstances permit 1  ?Sitting and talking to someone 0  ?Sitting quietly after a lunch without alcohol 0  ?In a car, while stopped for a few minutes in traffic 0  ?Total score 3  ? ? ? ?Data Reviewed: ?Compliance data  ?100% compliance set between 6 and 20 ?Residual AHI of 0.8 ?95 percentile pressure of 15.9 ? ?Assessment:  ?Obstructive sleep apnea ?-Compliance shows optimal treatment ? ?Generalized anxiety/depression ?-I believe this may not be optimally controlled at the present time and may be contributing to symptoms ? ?Symptoms of daytime sleepiness and fatigue are generally better ?-Continues to improve ? ? ?Plan/Recommendations: ? ?Continue nightly CPAP use ? ?Encourage weight loss efforts ? ?Encourage smoking cessation ? ?I believe yearly appointments is appropriate ? ?Encouraged to give [place] a call if he has any significant concerns ? ?He does have an appointment to see his primary care doctor in the next few weeks and will bring up the issue of optimizing his medications ? ?[facility] ?08/04/2021, 3:02 PM ? ?CC: [name], NP ? ? ? ?

## 2021-08-11 ENCOUNTER — Other Ambulatory Visit: Payer: Self-pay | Admitting: Psychiatry

## 2021-08-11 DIAGNOSIS — F324 Major depressive disorder, single episode, in partial remission: Secondary | ICD-10-CM

## 2021-08-11 DIAGNOSIS — F411 Generalized anxiety disorder: Secondary | ICD-10-CM

## 2021-08-11 DIAGNOSIS — F422 Mixed obsessional thoughts and acts: Secondary | ICD-10-CM

## 2021-08-20 ENCOUNTER — Telehealth: Payer: Self-pay | Admitting: Psychiatry

## 2021-08-20 NOTE — Telephone Encounter (Signed)
No med changes because it takes the Vraylar a while to clear out of his system.  However he needs to get the lithium level that I requested.  I do not see any lithium level on the chart since November.

## 2021-08-20 NOTE — Telephone Encounter (Signed)
Pt LVM at 2:07p.  He says he is extremely exhausted.  He feels like he has Mono.  He went and had blood work done a couple weeks ago and everything was fine.  He says he is taking 2 pills a day of Luvox (he thinks that's '200mg'$ ) and 3 pills a day of Lithium, (he thinks that's 900 mg). He says he stopped taking the Vraylar because it was making him agitated.  He wants to know if there is anything you can do to help him before his appt.  Next appt 6/12

## 2021-08-20 NOTE — Telephone Encounter (Signed)
Pt informed

## 2021-08-20 NOTE — Telephone Encounter (Signed)
Pt stated he stopped vraylar a week ago because its not helping with his tiredness and it makes him very agitated.Please advise if you want to make any changes before appt

## 2021-08-30 ENCOUNTER — Encounter: Payer: Self-pay | Admitting: Psychiatry

## 2021-08-30 ENCOUNTER — Ambulatory Visit (INDEPENDENT_AMBULATORY_CARE_PROVIDER_SITE_OTHER): Payer: Medicare Other | Admitting: Psychiatry

## 2021-08-30 DIAGNOSIS — F324 Major depressive disorder, single episode, in partial remission: Secondary | ICD-10-CM | POA: Diagnosis not present

## 2021-08-30 DIAGNOSIS — F1021 Alcohol dependence, in remission: Secondary | ICD-10-CM

## 2021-08-30 DIAGNOSIS — F411 Generalized anxiety disorder: Secondary | ICD-10-CM | POA: Diagnosis not present

## 2021-08-30 DIAGNOSIS — F422 Mixed obsessional thoughts and acts: Secondary | ICD-10-CM | POA: Diagnosis not present

## 2021-08-30 DIAGNOSIS — G4733 Obstructive sleep apnea (adult) (pediatric): Secondary | ICD-10-CM | POA: Diagnosis not present

## 2021-08-30 MED ORDER — FLUVOXAMINE MALEATE 100 MG PO TABS
ORAL_TABLET | ORAL | 1 refills | Status: DC
Start: 2021-08-30 — End: 2021-10-18

## 2021-08-30 NOTE — Progress Notes (Signed)
MOHAMED PORTLOCK [id number] December 07, 1965 56 y.o.  Subjective:   Patient ID:  GERVIS GABA is a 56 y.o. (DOB Jan 03, 1966) male.  Chief Complaint:  Chief Complaint  Patient presents with   Follow-up   Depression   Anxiety    Depression        Associated symptoms include fatigue.  Associated symptoms include no decreased concentration and no suicidal ideas.  Past medical history includes anxiety.   Anxiety Symptoms include nervous/anxious behavior. Patient reports no chest pain, confusion, decreased concentration or suicidal ideas.       [name] presents to the office today for follow-up of anxiety , depression, alcohol.    seen August 7.  His anxiety was unmanaged at Paxil 40 mg and olanzapine 5 mg so olanzapine was increased to 7.5 daily.  seen December 17, 2018.  His anxiety was unmanaged.  The following change was made: Trial 1-1/2 of the 7.5 mg tablets of olanzapine for anxiety.  He is had a stay at [facility] for alcohol dependence since he was last here. Stayed 28 days and DC before Thanksgiving.  Very helpful.  Was having NM nighly before and they stopped since then.  Not drinking.  Involved in AA since then.  seen March 25, 2019.  The patient was doing better requested a reduction in olanzapine to 7.5 mg nightly to improve energy.  visit April 03, 2019.  He had remained sober and his anxiety was improved with sobriety.  He was having problems with low motivation and forgetfulness.  At the next refill it was decided olanzapine would be reduced from 7.5 to 5 mg daily. He called requesting this urgent appointment because of worsening symptoms associated with returning to work.  seen May 21, 2019.  He was doing relatively well.  The following was noted: Tolerating Wellbutrin and it seems to help. He didn't tolerate the increase. Anxiety is improved off alcohol.  Ok to reduce olanzapine to 2.5 mg daily for a month and stop it.  Hopefully low motivation will  improve.   He called back March 23 stating he was more depressed and having a harder time doing routine tasks and having suicidal thoughts.  Because of worsening depression after reducing the olanzapine he was encouraged to increase olanzapine back to 5 mg nightly. He called again March 24 stating he was really struggling but was scheduled for an appointment on March 26.  As of June 14, 2019 he reports the following: Every day a real hard struggle to get through the days.  More anxious and depressed equally. Worse 2 weeks.  Last Saturday so overwhelmed by how he feels he had SI.  Everything is hard. Initial benefit paroxetine has been lost.  Awakens stressed and tired. Adequate hours of sleep.  Will be major chore just to mow grass.  Still sober. SE sexual. Getting appt with CPAP doc. Changes made include: Increasing olanzapine back to 5 mg daily a couple of days prior to the appointment due to phone call and the addition of lithium 300 mg for a few days then increase to 600 mg nightly both to augment the antidepressant and because of suicidal thoughts.  July 15, 2019 appointment, the following is noted: Doing OK with both depression and anxiety.  Kind of like a top always going in mind.  Even when I sleep, when awakens mind still going.  NM nightly for years but less than before stopped drinking.  Smaller and less severe ones that don't wake  him.  Theme can't finish things.  Both depression and anxiety 3/10.  More productive.  Paces himself.  Can live with it.  Sleep 9 hours.  Initially olanzapine stopped the ruminating but it's back some.  Overall still benefit.  No SI and can't rmember why he had them before. Residual energy not great and some ruminating. He had some concerns about sexual side effects from Paxil but agreed that no med changes were necessary despite residual symptoms of depression and anxiety.  08/14/2019 appointment, the following is noted:  He scheduled urgently. Vaccinated.  Angry easily and exhausted and everything is a chore.  Exhausted. Never had appt with CPAP company.  CPAP is 57 years old and on same settings as in the past.  CPAP machine is not working properly but when it did it was helpful for alertness and energy.  Disc need to call them to have it evaluated. To bed 10-7.  Don't feel rested in morning but used to feel rested when first started paroxetine. Plan: Increase Lithium continue to 900 mg daily for irritability  11/19/19 appt with the following noted: He increased to 900 mg and then reduced it but doesn't know why.  Reduced it to 300 nightly. Irritability much better.  Once anger since here with rage but not outward. Still on paroxetine 40, olanzapine 5, Wellbutrin 75 AM. Getting tired in afternoon and worrying about getting depressed but he's not depressed now.  Don't enjoy things like he used to.  Les interest and excitement. Appetite and sleep are normal.   Still adjusting without alcohol and socialization. Worrying about lack of sex drive with paroxetine. Can still ruminate on things until he gets some reassurance through talking with people. Plan: Continue low-dose lithium 300 mg daily Continue Paxil.  Did not feel well at 60 mg so we will continue 40 mg.  Likely to lose anxiety benefit if change it. Continue low-dose Wellbutrin 75  Tolerating Wellbutrin and it seems to help. He didn't tolerate the increase.   Continue olanzapine 5 mg  02/18/2020 appointment with following noted: Open to trying increase paroxetine again bc ruminating wears him out and gets fatigued and it wears him out though is 80% better vs before paroxetine. Doesn't remember trying higher paroxetine.  Ruminates on relationships or work.  Whenever he can talk about it with someone it helps tremendously but also realizes paroxetine helps. Anxiety affects dreams and NM too.  Up and down a little.  Anxiety drove depression before paroxetine.  Best med he's tried. SOBER 1  YEAR SE no problem.  Except gained 25# in 4 years.  No change in diet and exercise.  No increase in appetite and no food cravings.  Seeing PCP soon. Plan: Increase paroxetine trial to 60 for rumination.  Disc SE.  05/26/20 appt noted: No benefit increase paroxetine nor SE.  Wants to reduce back to 30 mg paroxetine and stop lithium bc it didn't seem to help.   4 days of Calm app has seemed to help rumination.  Does it in afternoon and before bed and it seemed to helps.  Doing a 10 min meditation and it helps.   Dep 4/10.  Anxiety 4/10. And a lot better the last few days.   Plan: Reduce lithium by 1 tablet per week.  Call if there is any suicidal thought. Starting April 1 reduce paroxetine to 40 mg daily. Continue low-dose Wellbutrin 75  Tolerating Wellbutrin and it seems to help. He didn't tolerate the increase.  If doing OK will try taper as he suggested at next visit. Continue olanzapine 5 mg nightly it was helpful for depression and anxiety.  07/01/2020 appt with following noted: Moved appt up. Now says he understated depression level when he was here the last time. Restarted lithium bc felt more depressed without it. Reduced paroxetine to 40 mg daily. Lethargic.  Mind won't calm down.  Nonrestorative sleep but 8 hours.  Doesn't think sleep is deep enough.  Tense. Goes to [employer]. Started counseling. Wants to try something different with meds. Can have mixed sx for 6 weeks with increased interest in things and motivation and want to spend money but still feels depressed.  3 times in the last year. Plan: Increase  lithium back to 3 daily to see if depression is better and less mood cycling.  He admits to feeling worse when he stopped the lithium Continue the reduce paroxetine to 45 mg daily. DC Wellbutrin Lamotrigine trial 25 mg to 100 mg daily over 4 weeks. Continue olanzapine 5 mg nightly it was helpful for depression and anxiety.  08/26/2020 appointment with the following  noted: Sad and down.  Classic depression sx and very fatigued.  Unusual to have classic depression bc usually is atypical.  No effect from lamotrigine. Wonders if atorvastatin is causing some confusion or depression bc read about it. Depressed for 3 mos. Gained 20#.  It's leveled off now. Olanzapine helped the rumination and anxiety.   Life is more depressing bc not seeing friends like it was bc stopped drinking.   No SE with lithium.  Unless dropping things. Sleep 1030 to 6 but doesn't sleep deep.  Not rested in AM Plan: Increase  lithium back to 3 daily to see if depression is better and less mood cycling.  He admits to feeling worse when he stopped the lithium Continue the reduce paroxetine to 40 mg daily bc it helped anxiety. Lamotrigine increase to 150 mg daily DT no benefit or SE Increase olanzapine10 mg nightly it was helpful for depression and anxiety.  Need more help with depression   09/11/2020 phone call: Pt stated he is foegetting things throughout the day and very unsteady.He works with tools and equptment and this is a problem.He stated he takes 6 Lamictal in the am. MD response: We made 3 med changes at the last visit including increasing lamotrigine to 150 mg daily, increasing olanzapine to 10 mg daily, and restarting lithium.  Any 1 of those changes could possibly cause some of the side effects.  We will have to make 1 change at a time to evaluate this.  Therefore reduce lamotrigine to 100 mg daily.  It may take a couple of weeks before he sees a difference.  11/13/2020 phone call: Patient lm stating he is currently taking Lamotrigine 150 mg. He has decreased it to 75 mg due to side effects per pt ( blurred vision, shaking and coordination issues). Patient stated he is discontinuing before his scheduled October appointment. MD response: Take the lamotrigine 75 mg for 2 weeks and he should be able to stop then without withdrawal.  If he gets shakey, nervous then we need to reduce more  slowly and let us know that.  Otherwise he can stop if he follows these directions.  11/30/2020 phone call: Pt called and advised he would like to taper off of the Olanzapine.  He has gained 8# since his last visit and 30# total since he started taking the med.  He feels  steady, not so much stress now, and would like to see if he can take Lorazepam instead.  Then talk to Dr. Clovis Pu in Oct when they meet. MD response: Reduce olanzapine to one half of the 10 mg tablet at night for 4 weeks and then stop it.  If he gets more depressed then get back in touch with [place] and we will try to do something about the weight gain that olanzapine can cause by using the alternative Lybalvi  12/07/2020 appointment with the following noted: Off lamotrigine .  Reduced olanzapine to 5 mg for a week.  On lithium 900 mg HS, paroxetine 40 Fatigue 10/10.  Dizziness resolved off lamotrigine.  Low fatigue and motivation.  "I want to want to do things".  Stopped drinking almost 18 mos and socially big adjustment.   Still invloved with AA and daily sponsor.  Working [employer].  [employer] helped. Thinks mild depression with little interest and motivation and no joy.  But not severe.  No SI. No anxiety and it's way better.  Life is more stable and helps.  Working 4 hours daily and occ extrea jobs. Uses new CPAP.  Stil drowsy and fatigue. Plan: Continue CPAP use. New CPAP machine. Trial modafinil 100-200 mg AM Lamotrigine off DT NR Reduced olanzapine to 5 mg daily for a week and well stop in 3 weeks DT wt gain and fatigue  01/20/2021 phone call: I called patient to see if I could get better information. He states he started modafinil a couple of weeks ago, though Rx was written for in September. He states he didn't feel well before taking the modafinil though. He just says he is agitated, irritable, and unstable feeling. When asked what he meant by unstable he stated feeling a "shell of myself".  He has been put on the cancellation  list.  MD response: It is likely the modafinil that is causing this problem.  But it may be corrected with a dose reduction.  I assume he is taking a whole tablet.  If that is the case time to reduce to 1/2 tablet in the morning.  If he is taking half a tablet tell him to reduce it to a quarter of a tablet.  And I agree we need to try to get him in as soon as possible. He reduced modafinil from 200 mg to 100 mg daily.  02/10/2021 appointment with the following noted: Current psych meds lithium 900 mg nightly, paroxetine 40 mg daily, olanzapine recently stopped, modafinil recently tried. Was really bad for awhile feeling depresssed and agitated and anxiety but better now including after reducing modafinil to 100 mg daily. Fatigue is better and in the middle now with energy, not good but not bad. Sleep is better.  CPAP doctor yesterday and they are gathering info pending. NAC is helping with memory. Still sober and committed to it. SE constipation  Plan: No med changes  03/10/2021 phone call from patient complaining of vague feelings of not feeling well and wanting the Paxil changed.  He was placed on the cancellation list.  03/18/2021 appointment urgently scheduled at his request: Remains on paroxetine 40 mg daily, lithium 1200 mg daily, modafinil 200 mg every morning. "Ruminating"  with sense of need to talk to someone.  Then feels better for awhile.  Mostly on relationships with family.  No one to talk to. Wonders need to chang ethe meds.   Don't socialize anymore since sober.  Does online AA everyday at noon. Lost  10# off olanzapine. Plan: Continue paroxetine 40 mg, lithium 1200 mg daily, Try stopping modafinil '100mg'$  AM to determine if it is helping or not. If not less anxious then start risperidone for rumination 1-2 mg HS  04/05/2021 appointment with the following noted: Stopped  modafinil and it helped reduce anxiety. Started risperidone 2 instead of 1 mg and was off balance so  reduced risperidone to 1 mg HS. Only done this for 3 nights.  No SE with it. Anxiety is not close to as bad as originally.  Can be painful but not now. Still ruminating some "my whole adult life" but varies.  Anxiety can lead to depression. 6-7/10 with 10 good on depression with some problems with ambition. Plan: Continue trial risperidone for rumination 1 mg HS  04/07/2021 TC: Several phone calls: Called 2 days after the visit complaining of itching from the lithium and wanting to come off of it.  He was warned he was more emotionally distressed when he reduced the lithium in the past but he wanted to pursue it anyway.  He was continuing risperidone 1 mg nightly but wanting to reduce that also but was warned he would almost certainly have worsening mood severity and anxiety problems based on past experience.  He was cautioned against reducing lithium below 900 mg daily and risperidone below 1 mg nightly.  He agreed on 04/09/2021.  04/09/2021 he called again and several phone calls ensued thereafter.  He insisted on stopping lithium and risperidone despite warnings that his mood stability and anxiety would worsen. He was prescribed Depakote to take the place  04/20/2021 appointment with the following noted: He stopped risperidone and did not take the Depakote.  He is taking lithium 900 mg nightly and paroxetine 40 mg daily. Stopped risperidone bc feeling agitated and unstable and blamed it.  Still feels the same off of it. Says itching 85% better with less lithium.  Itching for a couple of mos.  Doesn't think anxiety noticeably worse after reducing lithium to 900 mg daily. Not dizzy.  Clumsy with hands. Anxious but not painful like it was before Paxil but tense at the end of the day.  Initially felt normal for the first time in 20 years when first started paxil but not as good now. Not sadness and hasn't had much of it. Started therapy with doctor on demand. Ruminating on "fear of not being fully  present" and worries about little things like appts pending.   Afraid of working FT because gets overwhelmed bc "I take it home with me" Plan : Continue trial risperidone but increase to rumination 1.5 mg HS Continue paroxetine 40 and lithium 600 mg   06/01/2021 appointment with the following noted: Haven't seen benefit with risperidone 1.5 mg daily. Not in pain but feels disconnected and not myself.  Has felt like this off and on for years. Level of angst all day but not real high unless triggered.  Still has the rumination. Initially a lot of benefit from paroxetine Not markedly deprssed and not sad.  07/14/21 appt noted:  Doesn't feel much different with switch to fluvoxamine except anxiety well controlled. But still low energy, motivation and not as productive as he wants to be.  Not markedly sad.  Asks about Vraylar. Not much interest or enjoyment. Don't sleep that great and don't relax that great. On fluvoxamine 200, lithium 900, off risperidone. Plan: Anxiety controlled by fluvoxamine 200 Potentiate Vraylar 1.5 mg daily  07/27/2021 phone call complaining of ongoing depression.  Vraylar increased to 3 mg daily. 07/29/2021 phone call complaining of cost of Vraylar.  Was given samples. 08/20/2021 phone call complaining of fatigue.  Stated he stopped the Vraylar because it made him agitated.  It was recommended there be no further med changes because he is only been off the Vraylar for 1 week.  08/30/2021 appointment noted: Vraylar 1.5 mg didn't do much.  Increased to 3 mg and then felt irritable ans stopped it about 3rd week of May.  Better now. Still extremely tired and feels physically sick with normal workup. Thinks his mind wears him out.  Racing negative thoughts all the time. Asks about Ativan. Always nervous and anxious.  Trouble discerning anxiety from depression.   Seeing [name] PhD  On disability for 7-8 years.  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  Sober 2 year  01/2019  Past psych meds:  Paxil 60 "too much",  duloxetine, Wellbutrin 75 (max tolerated),  Zoloft, Viibryd 60 NR Fluvoxamine 200 Abilify, Rexulti. Vraylar irritable at 3 mg daily.  Buspar stopped DT little effect. Xanax,  Olanzapine 10 fatigue Risperdal 2 once lithium  900, worse when he stopped lithium Lamotrigine NR CO dizzy NAC 600 helped Modafinil 200 agitated Took lorazepam  in the past, Xanax less helpful  Review of Systems:  Review of Systems  Constitutional:  Positive for fatigue. Negative for unexpected weight change.  Cardiovascular:  Negative for chest pain.  Gastrointestinal:  Positive for constipation.  Neurological:  Negative for tremors.  Psychiatric/Behavioral:  Positive for dysphoric mood. Negative for agitation, behavioral problems, confusion, decreased concentration, hallucinations, self-injury, sleep disturbance and suicidal ideas. The patient is nervous/anxious. The patient is not hyperactive.     Medications: I have reviewed the patient's current medications.  Current Outpatient Medications  Medication Sig Dispense Refill   amLODipine (NORVASC) 5 MG tablet Take daily 90 tablet 3   atorvastatin (LIPITOR) 40 MG tablet TAKE 1 TABLET(40 MG) BY MOUTH DAILY 90 tablet 3   Cyanocobalamin (VITAMIN B-12 PO) Take by mouth.     lisinopril (ZESTRIL) 20 MG tablet TAKE 1 TABLET(20 MG) BY MOUTH DAILY 90 tablet 3   lithium carbonate 300 MG capsule Take 3 capsules (900 mg total) by mouth at bedtime. 3 capsules HS 270 capsule 0   fluvoxaMINE (LUVOX) 100 MG tablet 1/2 tablet in the morning and 2 tablets at night 75 tablet 1   risperiDONE (RISPERDAL) 3 MG tablet Take 0.5 tablets (1.5 mg total) by mouth at bedtime. (Patient not taking: Reported on 08/30/2021) 15 tablet 1   No current facility-administered medications for this visit.    Medication Side Effects: sexual, weight  Allergies: No Known Allergies  Past Medical History:  Diagnosis Date   Anxiety    Bimalleolar  fracture of left ankle    Depression    Heart murmur    Sleep apnea    uses CPAP nightly    Family History  Problem Relation Age of Onset   Cancer Maternal Grandfather    Colon cancer Neg Hx    Colon polyps Neg Hx    Esophageal cancer Neg Hx    Rectal cancer Neg Hx    Stomach cancer Neg Hx     Social History   Socioeconomic History   Marital status: Single    Spouse name: Not on file   Number of children: Not on file   Years of education: Not on file   Highest education level: Not on file  Occupational History   Not  on file  Tobacco Use   Smoking status: Every Day    Packs/day: 1.50    Years: 0.00    Total pack years: 0.00    Types: Cigarettes    Start date: 03/22/1975   Smokeless tobacco: Never   Tobacco comments:    He reports he has quit 3 times and recently started back in 2012. Hsm   Vaping Use   Vaping Use: Never used  Substance and Sexual Activity   Alcohol use: Not Currently   Drug use: No   Sexual activity: Not on file  Other Topics Concern   Not on file  Social History Narrative   Not on file   Social Determinants of Health   Financial Resource Strain: Low Risk  (07/29/2021)   Overall Financial Resource Strain (CARDIA)    Difficulty of Paying Living Expenses: Not hard at all  Food Insecurity: No Food Insecurity (07/29/2021)   Hunger Vital Sign    Worried About Running Out of Food in the Last Year: Never true    Ran Out of Food in the Last Year: Never true  Transportation Needs: No Transportation Needs (07/29/2021)   PRAPARE - Hydrologist (Medical): No    Lack of Transportation (Non-Medical): No  Physical Activity: Inactive (07/29/2021)   Exercise Vital Sign    Days of Exercise per Week: 0 days    Minutes of Exercise per Session: 0 min  Stress: Stress Concern Present (07/29/2021)   [place]    Feeling of Stress : Rather much  Social Connections:  Moderately Isolated (07/29/2021)   Social Connection and Isolation Panel [NHANES]    Frequency of Communication with Friends and Family: More than three times a week    Frequency of Social Gatherings with Friends and Family: More than three times a week    Attends Religious Services: Never    Marine scientist or Organizations: Yes    Attends Music therapist: More than 4 times per year    Marital Status: Never married  Intimate Partner Violence: Not At Risk (07/29/2021)   Humiliation, Afraid, Rape, and Kick questionnaire    Fear of Current or Ex-Partner: No    Emotionally Abused: No    Physically Abused: No    Sexually Abused: No    Past Medical History, Surgical history, Social history, and Family history were reviewed and updated as appropriate.   Please see review of systems for further details on the patient's review from today.   Objective:   Physical Exam:  There were no vitals taken for this visit.  Physical Exam Constitutional:      General: He is not in acute distress.    Appearance: Normal appearance. He is well-developed.     Comments: Red face  Musculoskeletal:        General: No deformity.  Neurological:     Mental Status: He is alert and oriented to person, place, and time.     Motor: No tremor.     Coordination: Coordination normal.     Gait: Gait normal.  Psychiatric:        Attention and Perception: Attention normal. He is attentive.        Mood and Affect: Mood is anxious and depressed. Affect is not labile, blunt, angry or inappropriate.        Speech: Speech normal. Speech is not slurred.        Behavior: Behavior  normal. Behavior is not agitated or slowed.        Thought Content: Thought content normal. Thought content is not delusional. Thought content does not include homicidal or suicidal ideation. Thought content does not include suicidal plan.        Cognition and Memory: Cognition normal.        Judgment: Judgment normal.      Comments: Insight  Fair. Somewhat chronic sense of urgency appears lworse but not severe. Complaining of anhedonic depression      Lab Review:     Component Value Date/Time   NA 139 06/17/2021 1350   K 4.1 06/17/2021 1350   CL 104 06/17/2021 1350   CO2 28 06/17/2021 1350   GLUCOSE 90 06/17/2021 1350   BUN 17 06/17/2021 1350   CREATININE 1.30 06/17/2021 1350   CALCIUM 9.8 06/17/2021 1350   PROT 7.6 06/17/2021 1350   ALBUMIN 4.7 06/17/2021 1350   AST 19 06/17/2021 1350   ALT 23 06/17/2021 1350   ALKPHOS 108 06/17/2021 1350   BILITOT 0.5 06/17/2021 1350       Component Value Date/Time   WBC 8.3 06/17/2021 1350   RBC 5.12 06/17/2021 1350   HGB 15.7 06/17/2021 1350   HGB 15.2 05/05/2010 1049   HCT 46.4 06/17/2021 1350   HCT 44.8 05/05/2010 1049   PLT 229.0 06/17/2021 1350   PLT 193 05/05/2010 1049   MCV 90.7 06/17/2021 1350   MCV 89.6 05/05/2010 1049   MCH 30.4 05/05/2010 1049   MCHC 33.9 06/17/2021 1350   RDW 13.2 06/17/2021 1350   RDW 13.1 05/05/2010 1049   LYMPHSABS 2.6 06/17/2021 1350   LYMPHSABS 1.8 05/05/2010 1049   MONOABS 0.6 06/17/2021 1350   MONOABS 0.4 05/05/2010 1049   EOSABS 0.2 06/17/2021 1350   EOSABS 0.2 05/05/2010 1049   BASOSABS 0.1 06/17/2021 1350   BASOSABS 0.0 05/05/2010 1049    Lithium Lvl  Date Value Ref Range Status  02/02/2021 0.6 0.6 - 1.2 mmol/L Final   02/02/21 lithium level 0.6 on 900 mg daily  No results found for: "PHENYTOIN", "PHENOBARB", "VALPROATE", "CBMZ"   .res Assessment: Plan:     Camdin was seen today for follow-up, depression and anxiety.  Diagnoses and all orders for this visit:  Major depressive disorder with single episode, in partial remission ([place]) -     fluvoxaMINE (LUVOX) 100 MG tablet; 1/2 tablet in the morning and 2 tablets at night  GAD (generalized anxiety disorder) -     fluvoxaMINE (LUVOX) 100 MG tablet; 1/2 tablet in the morning and 2 tablets at night  Mixed obsessional thoughts and acts -      fluvoxaMINE (LUVOX) 100 MG tablet; 1/2 tablet in the morning and 2 tablets at night  OSA (obstructive sleep apnea)  Alcohol dependence in remission ([facility])   Chronic recurrent rumination with anxiety and urgency and sense of need and instablity with impatience but not quite desperate were better but anhedonic depression and anxious.  But not as painful as it was.   Chronic worry and overwhelmed easily and misattributes sx as SE of meds leading to inadequate med trials.  Fear of meds.  Anxiety was controlled by fluvoxamine 200 but now he thinks it is worse. So increase 250 mg daily  Consider Auvelity  While the patient denies symptoms of grandiosity or euphoria he does describe some other manic cycling symptoms including increased interest, increased goal-directed behaviors, increased urge to spend that will last for 6 weeks or so and  then stop.  He has these cycles about 3 times a year.  This is suggestive of a bipolar predisposition but I am not yet changing his diagnosis.   Continue CPAP use. New CPAP machine.  Falsely attributes his psych sx to the meds and then stops meds AMA.  False attribution of sx as SE.  Discussed potential metabolic side effects associated with atypical antipsychotics, as well as potential risk for movement side effects. Advised pt to contact office if movement side effects occur.  He agrees.  Call if you have any problems with this.  He remains sober.  He wants to consider counseling to work through some personal issues as well as address specific symptoms like his anger and irritability. Rec local AA instead of online.  Continue lithium 900 mg daily   He admits to feeling worse when he stopped the lithium Counseled patient regarding potential benefits, risks, and side effects of lithium to include potential risk of lithium affecting thyroid and renal function.  Discussed need for periodic lab monitoring to determine drug level and to assess for potential adverse  effects.  Counseled patient regarding signs and symptoms of lithium toxicity and advised that they notify office immediately or seek urgent medical attention if experiencing these signs and symptoms.  Patient advised to contact office with any questions or concerns. 02/02/21  02/02/21 lithium level 0.6 on 900 mg daily, normal B12 and vitamin D 52 Check lithiium level.  Disc the off-label use of N-Acetylcysteine at 600 mg daily to help with mild cognitive problems.  It can be combined with a B-complex vitamin as the B-12 and folate have been shown to sometimes enhance the effect.  Partial benefit so increase NAC to 1200 mg daily.  Reads on internet about meds.  Disc this. Needs a lot of reassurance.  Constipation management 1.  Lots of water 2.  Powdered fiber supplement such as MiraLAX, Citrucel, etc. preferably with a meal 3.  2 stool softeners a day 4.  Milk of magnesia or magnesium tablets if needed  FU 8 weeks  Lynder Parents, MD, DFAPA   Please see After Visit Summary for patient specific instructions.  Future Appointments  Date Time Provider [place]  07/26/2022  2:45 PM [facility]SE HEALTH ADVISOR [facility]    No orders of the defined types were placed in this encounter.       -------------------------------

## 2021-09-10 ENCOUNTER — Other Ambulatory Visit: Payer: Self-pay | Admitting: Adult Health

## 2021-09-10 DIAGNOSIS — I10 Essential (primary) hypertension: Secondary | ICD-10-CM

## 2021-09-27 ENCOUNTER — Other Ambulatory Visit: Payer: Self-pay | Admitting: Adult Health

## 2021-09-28 ENCOUNTER — Other Ambulatory Visit: Payer: Self-pay | Admitting: Adult Health

## 2021-10-13 ENCOUNTER — Telehealth: Payer: Medicare Other

## 2021-10-18 ENCOUNTER — Ambulatory Visit: Payer: Medicare Other | Admitting: Psychiatry

## 2021-10-18 ENCOUNTER — Encounter: Payer: Self-pay | Admitting: Psychiatry

## 2021-10-18 DIAGNOSIS — F422 Mixed obsessional thoughts and acts: Secondary | ICD-10-CM

## 2021-10-18 DIAGNOSIS — G4733 Obstructive sleep apnea (adult) (pediatric): Secondary | ICD-10-CM | POA: Diagnosis not present

## 2021-10-18 DIAGNOSIS — F324 Major depressive disorder, single episode, in partial remission: Secondary | ICD-10-CM | POA: Diagnosis not present

## 2021-10-18 DIAGNOSIS — F411 Generalized anxiety disorder: Secondary | ICD-10-CM

## 2021-10-18 MED ORDER — FLUVOXAMINE MALEATE 100 MG PO TABS
ORAL_TABLET | ORAL | 0 refills | Status: DC
Start: 2021-10-18 — End: 2022-01-06

## 2021-10-18 MED ORDER — LITHIUM CARBONATE 300 MG PO CAPS: 600.0000 mg | ORAL_CAPSULE | Freq: Every evening | ORAL | 0 refills | Status: DC

## 2021-10-18 NOTE — Progress Notes (Signed)
Jonathon Walker [id number] [birth year] 56 y.o.  Subjective:   Patient ID:  WISE FEES is a 56 y.o. (DOB 1965/10/25) male.  Chief Complaint:  Chief Complaint  Patient presents with   Follow-up   Depression   Anxiety    Depression        Associated symptoms include fatigue.  Associated symptoms include no decreased concentration and no suicidal ideas.  Past medical history includes anxiety.   Anxiety Symptoms include nausea and nervous/anxious behavior. Patient reports no chest pain, confusion, decreased concentration or suicidal ideas.       Jonathon Walker presents to the office today for follow-up of anxiety , depression, alcohol.    seen August 7.  His anxiety was unmanaged at Paxil 40 mg and olanzapine 5 mg so olanzapine was increased to 7.5 daily.  seen December 17, 2018.  His anxiety was unmanaged.  The following change was made: Trial 1-1/2 of the 7.5 mg tablets of olanzapine for anxiety.  He is had a stay at [facility] for alcohol dependence since he was last here. Stayed 28 days and DC before Thanksgiving.  Very helpful.  Was having NM nighly before and they stopped since then.  Not drinking.  Involved in AA since then.  seen March 25, 2019.  The patient was doing better requested a reduction in olanzapine to 7.5 mg nightly to improve energy.  visit April 03, 2019.  He had remained sober and his anxiety was improved with sobriety.  He was having problems with low motivation and forgetfulness.  At the next refill it was decided olanzapine would be reduced from 7.5 to 5 mg daily. He called requesting this urgent appointment because of worsening symptoms associated with returning to work.  seen May 21, 2019.  He was doing relatively well.  The following was noted: Tolerating Wellbutrin and it seems to help. He didn't tolerate the increase. Anxiety is improved off alcohol.  Ok to reduce olanzapine to 2.5 mg daily for a month and stop it.  Hopefully low  motivation will improve.   He called back March 23 stating he was more depressed and having a harder time doing routine tasks and having suicidal thoughts.  Because of worsening depression after reducing the olanzapine he was encouraged to increase olanzapine back to 5 mg nightly. He called again March 24 stating he was really struggling but was scheduled for an appointment on March 26.  As of June 14, 2019 he reports the following: Every day a real hard struggle to get through the days.  More anxious and depressed equally. Worse 2 weeks.  Last Saturday so overwhelmed by how he feels he had SI.  Everything is hard. Initial benefit paroxetine has been lost.  Awakens stressed and tired. Adequate hours of sleep.  Will be major chore just to mow grass.  Still sober. SE sexual. Getting appt with CPAP doc. Changes made include: Increasing olanzapine back to 5 mg daily a couple of days prior to the appointment due to phone call and the addition of lithium 300 mg for a few days then increase to 600 mg nightly both to augment the antidepressant and because of suicidal thoughts.  July 15, 2019 appointment, the following is noted: Doing OK with both depression and anxiety.  Kind of like a top always going in mind.  Even when I sleep, when awakens mind still going.  NM nightly for years but less than before stopped drinking.  Smaller and less severe ones that  don't wake him.  Theme can't finish things.  Both depression and anxiety 3/10.  More productive.  Paces himself.  Can live with it.  Sleep 9 hours.  Initially olanzapine stopped the ruminating but it's back some.  Overall still benefit.  No SI and can't rmember why he had them before. Residual energy not great and some ruminating. He had some concerns about sexual side effects from Paxil but agreed that no med changes were necessary despite residual symptoms of depression and anxiety.  08/14/2019 appointment, the following is noted:  He scheduled  urgently. Vaccinated. Angry easily and exhausted and everything is a chore.  Exhausted. Never had appt with CPAP company.  CPAP is 57 years old and on same settings as in the past.  CPAP machine is not working properly but when it did it was helpful for alertness and energy.  Disc need to call them to have it evaluated. To bed 10-7.  Don't feel rested in morning but used to feel rested when first started paroxetine. Plan: Increase Lithium continue to 900 mg daily for irritability  11/19/19 appt with the following noted: He increased to 900 mg and then reduced it but doesn't know why.  Reduced it to 300 nightly. Irritability much better.  Once anger since here with rage but not outward. Still on paroxetine 40, olanzapine 5, Wellbutrin 75 AM. Getting tired in afternoon and worrying about getting depressed but he's not depressed now.  Don't enjoy things like he used to.  Les interest and excitement. Appetite and sleep are normal.   Still adjusting without alcohol and socialization. Worrying about lack of sex drive with paroxetine. Can still ruminate on things until he gets some reassurance through talking with people. Plan: Continue low-dose lithium 300 mg daily Continue Paxil.  Did not feel well at 60 mg so we will continue 40 mg.  Likely to lose anxiety benefit if change it. Continue low-dose Wellbutrin 75  Tolerating Wellbutrin and it seems to help. He didn't tolerate the increase.   Continue olanzapine 5 mg  02/18/2020 appointment with following noted: Open to trying increase paroxetine again bc ruminating wears him out and gets fatigued and it wears him out though is 80% better vs before paroxetine. Doesn't remember trying higher paroxetine.  Ruminates on relationships or work.  Whenever he can talk about it with someone it helps tremendously but also realizes paroxetine helps. Anxiety affects dreams and NM too.  Up and down a little.  Anxiety drove depression before paroxetine.  Best med he's  tried. SOBER 1 YEAR SE no problem.  Except gained 25# in 4 years.  No change in diet and exercise.  No increase in appetite and no food cravings.  Seeing PCP soon. Plan: Increase paroxetine trial to 60 for rumination.  Disc SE.  05/26/20 appt noted: No benefit increase paroxetine nor SE.  Wants to reduce back to 30 mg paroxetine and stop lithium bc it didn't seem to help.   4 days of Calm app has seemed to help rumination.  Does it in afternoon and before bed and it seemed to helps.  Doing a 10 min meditation and it helps.   Dep 4/10.  Anxiety 4/10. And a lot better the last few days.   Plan: Reduce lithium by 1 tablet per week.  Call if there is any suicidal thought. Starting April 1 reduce paroxetine to 40 mg daily. Continue low-dose Wellbutrin 75  Tolerating Wellbutrin and it seems to help. He didn't tolerate the increase.  If doing OK will try taper as he suggested at next visit. Continue olanzapine 5 mg nightly it was helpful for depression and anxiety.  07/01/2020 appt with following noted: Moved appt up. Now says he understated depression level when he was here the last time. Restarted lithium bc felt more depressed without it. Reduced paroxetine to 40 mg daily. Lethargic.  Mind won't calm down.  Nonrestorative sleep but 8 hours.  Doesn't think sleep is deep enough.  Tense. Goes to [employer]. Started counseling. Wants to try something different with meds. Can have mixed sx for 6 weeks with increased interest in things and motivation and want to spend money but still feels depressed.  3 times in the last year. Plan: Increase  lithium back to 3 daily to see if depression is better and less mood cycling.  He admits to feeling worse when he stopped the lithium Continue the reduce paroxetine to 45 mg daily. DC Wellbutrin Lamotrigine trial 25 mg to 100 mg daily over 4 weeks. Continue olanzapine 5 mg nightly it was helpful for depression and anxiety.  08/26/2020 appointment with  the following noted: Sad and down.  Classic depression sx and very fatigued.  Unusual to have classic depression bc usually is atypical.  No effect from lamotrigine. Wonders if atorvastatin is causing some confusion or depression bc read about it. Depressed for 3 mos. Gained 20#.  It's leveled off now. Olanzapine helped the rumination and anxiety.   Life is more depressing bc not seeing friends like it was bc stopped drinking.   No SE with lithium.  Unless dropping things. Sleep 1030 to 6 but doesn't sleep deep.  Not rested in AM Plan: Increase  lithium back to 3 daily to see if depression is better and less mood cycling.  He admits to feeling worse when he stopped the lithium Continue the reduce paroxetine to 40 mg daily bc it helped anxiety. Lamotrigine increase to 150 mg daily DT no benefit or SE Increase olanzapine10 mg nightly it was helpful for depression and anxiety.  Need more help with depression   09/11/2020 phone call: Pt stated he is foegetting things throughout the day and very unsteady.He works with tools and equptment and this is a problem.He stated he takes 6 Lamictal in the am. MD response: We made 3 med changes at the last visit including increasing lamotrigine to 150 mg daily, increasing olanzapine to 10 mg daily, and restarting lithium.  Any 1 of those changes could possibly cause some of the side effects.  We will have to make 1 change at a time to evaluate this.  Therefore reduce lamotrigine to 100 mg daily.  It may take a couple of weeks before he sees a difference.  11/13/2020 phone call: Patient lm stating he is currently taking Lamotrigine 150 mg. He has decreased it to 75 mg due to side effects per pt ( blurred vision, shaking and coordination issues). Patient stated he is discontinuing before his scheduled October appointment. MD response: Take the lamotrigine 75 mg for 2 weeks and he should be able to stop then without withdrawal.  If he gets shakey, nervous then we need  to reduce more slowly and let us know that.  Otherwise he can stop if he follows these directions.  11/30/2020 phone call: Pt called and advised he would like to taper off of the Olanzapine.  He has gained 8# since his last visit and 30# total since he started taking the med.  He feels  steady, not so much stress now, and would like to see if he can take Lorazepam instead.  Then talk to Dr. Clovis Pu in Oct when they meet. MD response: Reduce olanzapine to one half of the 10 mg tablet at night for 4 weeks and then stop it.  If he gets more depressed then get back in touch with [place] and we will try to do something about the weight gain that olanzapine can cause by using the alternative Lybalvi  12/07/2020 appointment with the following noted: Off lamotrigine .  Reduced olanzapine to 5 mg for a week.  On lithium 900 mg HS, paroxetine 40 Fatigue 10/10.  Dizziness resolved off lamotrigine.  Low fatigue and motivation.  "I want to want to do things".  Stopped drinking almost 18 mos and socially big adjustment.   Still invloved with AA and daily sponsor.  Working [employer].  [employer] helped. Thinks mild depression with little interest and motivation and no joy.  But not severe.  No SI. No anxiety and it's way better.  Life is more stable and helps.  Working 4 hours daily and occ extrea jobs. Uses new CPAP.  Stil drowsy and fatigue. Plan: Continue CPAP use. New CPAP machine. Trial modafinil 100-200 mg AM Lamotrigine off DT NR Reduced olanzapine to 5 mg daily for a week and well stop in 3 weeks DT wt gain and fatigue  01/20/2021 phone call: I called patient to see if I could get better information. He states he started modafinil a couple of weeks ago, though Rx was written for in September. He states he didn't feel well before taking the modafinil though. He just says he is agitated, irritable, and unstable feeling. When asked what he meant by unstable he stated feeling a "shell of myself".  He has been put on the  cancellation list.  MD response: It is likely the modafinil that is causing this problem.  But it may be corrected with a dose reduction.  I assume he is taking a whole tablet.  If that is the case time to reduce to 1/2 tablet in the morning.  If he is taking half a tablet tell him to reduce it to a quarter of a tablet.  And I agree we need to try to get him in as soon as possible. He reduced modafinil from 200 mg to 100 mg daily.  02/10/2021 appointment with the following noted: Current psych meds lithium 900 mg nightly, paroxetine 40 mg daily, olanzapine recently stopped, modafinil recently tried. Was really bad for awhile feeling depresssed and agitated and anxiety but better now including after reducing modafinil to 100 mg daily. Fatigue is better and in the middle now with energy, not good but not bad. Sleep is better.  CPAP doctor yesterday and they are gathering info pending. NAC is helping with memory. Still sober and committed to it. SE constipation  Plan: No med changes  03/10/2021 phone call from patient complaining of vague feelings of not feeling well and wanting the Paxil changed.  He was placed on the cancellation list.  03/18/2021 appointment urgently scheduled at his request: Remains on paroxetine 40 mg daily, lithium 1200 mg daily, modafinil 200 mg every morning. "Ruminating"  with sense of need to talk to someone.  Then feels better for awhile.  Mostly on relationships with family.  No one to talk to. Wonders need to chang ethe meds.   Don't socialize anymore since sober.  Does online AA everyday at noon. Lost  10# off olanzapine. Plan: Continue paroxetine 40 mg, lithium 1200 mg daily, Try stopping modafinil '100mg'$  AM to determine if it is helping or not. If not less anxious then start risperidone for rumination 1-2 mg HS  04/05/2021 appointment with the following noted: Stopped  modafinil and it helped reduce anxiety. Started risperidone 2 instead of 1 mg and was off  balance so reduced risperidone to 1 mg HS. Only done this for 3 nights.  No SE with it. Anxiety is not close to as bad as originally.  Can be painful but not now. Still ruminating some "my whole adult life" but varies.  Anxiety can lead to depression. 6-7/10 with 10 good on depression with some problems with ambition. Plan: Continue trial risperidone for rumination 1 mg HS  04/07/2021 TC: Several phone calls: Called 2 days after the visit complaining of itching from the lithium and wanting to come off of it.  He was warned he was more emotionally distressed when he reduced the lithium in the past but he wanted to pursue it anyway.  He was continuing risperidone 1 mg nightly but wanting to reduce that also but was warned he would almost certainly have worsening mood severity and anxiety problems based on past experience.  He was cautioned against reducing lithium below 900 mg daily and risperidone below 1 mg nightly.  He agreed on 04/09/2021.  04/09/2021 he called again and several phone calls ensued thereafter.  He insisted on stopping lithium and risperidone despite warnings that his mood stability and anxiety would worsen. He was prescribed Depakote to take the place  04/20/2021 appointment with the following noted: He stopped risperidone and did not take the Depakote.  He is taking lithium 900 mg nightly and paroxetine 40 mg daily. Stopped risperidone bc feeling agitated and unstable and blamed it.  Still feels the same off of it. Says itching 85% better with less lithium.  Itching for a couple of mos.  Doesn't think anxiety noticeably worse after reducing lithium to 900 mg daily. Not dizzy.  Clumsy with hands. Anxious but not painful like it was before Paxil but tense at the end of the day.  Initially felt normal for the first time in 20 years when first started paxil but not as good now. Not sadness and hasn't had much of it. Started therapy with doctor on demand. Ruminating on "fear of not  being fully present" and worries about little things like appts pending.   Afraid of working FT because gets overwhelmed bc "I take it home with me" Plan : Continue trial risperidone but increase to rumination 1.5 mg HS Continue paroxetine 40 and lithium 600 mg   06/01/2021 appointment with the following noted: Haven't seen benefit with risperidone 1.5 mg daily. Not in pain but feels disconnected and not myself.  Has felt like this off and on for years. Level of angst all day but not real high unless triggered.  Still has the rumination. Initially a lot of benefit from paroxetine Not markedly deprssed and not sad.  07/14/21 appt noted:  Doesn't feel much different with switch to fluvoxamine except anxiety well controlled. But still low energy, motivation and not as productive as he wants to be.  Not markedly sad.  Asks about Vraylar. Not much interest or enjoyment. Don't sleep that great and don't relax that great. On fluvoxamine 200, lithium 900, off risperidone. Plan: Anxiety controlled by fluvoxamine 200 Potentiate Vraylar 1.5 mg daily  07/27/2021 phone call complaining of ongoing depression.  Vraylar increased to 3 mg daily. 07/29/2021 phone call complaining of cost of Vraylar.  Was given samples. 08/20/2021 phone call complaining of fatigue.  Stated he stopped the Vraylar because it made him agitated.  It was recommended there be no further med changes because he is only been off the Vraylar for 1 week.  08/30/2021 appointment noted: Vraylar 1.5 mg didn't do much.  Increased to 3 mg and then felt irritable ans stopped it about 3rd week of May.  Better now. Still extremely tired and feels physically sick with normal workup. Thinks his mind wears him out.  Racing negative thoughts all the time. Asks about Ativan. Always nervous and anxious.  Trouble discerning anxiety from depression. Plan: Anxiety was controlled by fluvoxamine 200 but now he thinks it is worse. So increase 250 mg  daily  10/18/21 appt noted: No results with increase fluvoxamine.  No SE. Anxiety is still worse than depression.  Thinks it causes somatic sx like the flu.  Exhausted. GI. Poor sleep and concentration. Asks about lorazepam.   Got checked out by PCP without findings.    Seeing [name] PhD  On disability for 7-8 years.  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  Sober 2 year 01/2019  Past psych meds:  Paxil 60 "too much",  duloxetine, Wellbutrin 75 (max tolerated),  Zoloft, Viibryd 60 NR Fluvoxamine 200 Abilify, Rexulti. Vraylar irritable at 3 mg daily.  Buspar stopped DT little effect. Xanax,  Olanzapine 10 fatigue Risperdal 2 once lithium  900, worse when he stopped lithium Lamotrigine NR CO dizzy NAC 600 helped Modafinil 200 agitated Took lorazepam  in the past, Xanax less helpful  Review of Systems:  Review of Systems  Constitutional:  Positive for fatigue. Negative for unexpected weight change.  Cardiovascular:  Negative for chest pain.  Gastrointestinal:  Positive for constipation and nausea.  Neurological:  Negative for tremors.  Psychiatric/Behavioral:  Positive for dysphoric mood. Negative for agitation, behavioral problems, confusion, decreased concentration, hallucinations, self-injury, sleep disturbance and suicidal ideas. The patient is nervous/anxious. The patient is not hyperactive.     Medications: I have reviewed the patient's current medications.  Current Outpatient Medications  Medication Sig Dispense Refill   amLODipine (NORVASC) 5 MG tablet TAKE 1 TABLET BY MOUTH EVERY DAY 90 tablet 3   atorvastatin (LIPITOR) 40 MG tablet Take 1 tablet (40 mg total) by mouth daily. SCHEDULE APPT FOR REFILLS 30 tablet 0   Cyanocobalamin (VITAMIN B-12 PO) Take by mouth.     lisinopril (ZESTRIL) 20 MG tablet TAKE 1 TABLET(20 MG) BY MOUTH DAILY 90 tablet 3   fluvoxaMINE (LUVOX) 100 MG tablet 1 tablet in the morning and 2 tablets at night 270 tablet 0   lithium  carbonate 300 MG capsule Take 2 capsules (600 mg total) by mouth at bedtime. 180 capsule 0   risperiDONE (RISPERDAL) 3 MG tablet Take 0.5 tablets (1.5 mg total) by mouth at bedtime. (Patient not taking: Reported on 08/30/2021) 15 tablet 1   No current facility-administered medications for this visit.    Medication Side Effects: sexual, weight  Allergies: No Known Allergies  Past Medical History:  Diagnosis Date   Anxiety    Bimalleolar fracture of left ankle    Depression    Heart murmur    Sleep apnea    uses CPAP nightly    Family History  Problem Relation Age of Onset   Cancer Maternal Grandfather    Colon cancer Neg Hx    Colon polyps  Neg Hx    Esophageal cancer Neg Hx    Rectal cancer Neg Hx    Stomach cancer Neg Hx     Social History   Socioeconomic History   Marital status: Single    Spouse name: Not on file   Number of children: Not on file   Years of education: Not on file   Highest education level: Not on file  Occupational History   Not on file  Tobacco Use   Smoking status: Every Day    Packs/day: 1.50    Years: 0.00    Total pack years: 0.00    Types: Cigarettes    Start date: 03/22/1975   Smokeless tobacco: Never   Tobacco comments:    He reports he has quit 3 times and recently started back in 2012. Hsm   Vaping Use   Vaping Use: Never used  Substance and Sexual Activity   Alcohol use: Not Currently   Drug use: No   Sexual activity: Not on file  Other Topics Concern   Not on file  Social History Narrative   Not on file   Social Determinants of Health   Financial Resource Strain: Low Risk  (07/29/2021)   Overall Financial Resource Strain (CARDIA)    Difficulty of Paying Living Expenses: Not hard at all  Food Insecurity: No Food Insecurity (07/29/2021)   Hunger Vital Sign    Worried About Running Out of Food in the Last Year: Never true    Ran Out of Food in the Last Year: Never true  Transportation Needs: No Transportation Needs  (07/29/2021)   PRAPARE - Hydrologist (Medical): No    Lack of Transportation (Non-Medical): No  Physical Activity: Inactive (07/29/2021)   Exercise Vital Sign    Days of Exercise per Week: 0 days    Minutes of Exercise per Session: 0 min  Stress: Stress Concern Present (07/29/2021)   [place]    Feeling of Stress : Rather much  Social Connections: Moderately Isolated (07/29/2021)   Social Connection and Isolation Panel [NHANES]    Frequency of Communication with Friends and Family: More than three times a week    Frequency of Social Gatherings with Friends and Family: More than three times a week    Attends Religious Services: Never    Marine scientist or Organizations: Yes    Attends Music therapist: More than 4 times per year    Marital Status: Never married  Intimate Partner Violence: Not At Risk (07/29/2021)   Humiliation, Afraid, Rape, and Kick questionnaire    Fear of Current or Ex-Partner: No    Emotionally Abused: No    Physically Abused: No    Sexually Abused: No    Past Medical History, Surgical history, Social history, and Family history were reviewed and updated as appropriate.   Please see review of systems for further details on the patient's review from today.   Objective:   Physical Exam:  There were no vitals taken for this visit.  Physical Exam Constitutional:      General: He is not in acute distress.    Appearance: Normal appearance. He is well-developed.     Comments: Red face  Musculoskeletal:        General: No deformity.  Neurological:     Mental Status: He is alert and oriented to person, place, and time.     Motor: No tremor.  Coordination: Coordination normal.     Gait: Gait normal.  Psychiatric:        Attention and Perception: Attention normal. He is attentive.        Mood and Affect: Mood is anxious and depressed. Affect is  not labile, blunt, angry or inappropriate.        Speech: Speech normal. Speech is not slurred.        Behavior: Behavior normal. Behavior is not agitated or slowed.        Thought Content: Thought content normal. Thought content is not delusional. Thought content does not include homicidal or suicidal ideation. Thought content does not include suicidal plan.        Cognition and Memory: Cognition normal.        Judgment: Judgment normal.     Comments: Insight  Fair. Somewhat chronic sense of urgency appears lworse but not severe. Chronic anxiety Complaining of anhedonic depression      Lab Review:     Component Value Date/Time   NA 139 06/17/2021 1350   K 4.1 06/17/2021 1350   CL 104 06/17/2021 1350   CO2 28 06/17/2021 1350   GLUCOSE 90 06/17/2021 1350   BUN 17 06/17/2021 1350   CREATININE 1.30 06/17/2021 1350   CALCIUM 9.8 06/17/2021 1350   PROT 7.6 06/17/2021 1350   ALBUMIN 4.7 06/17/2021 1350   AST 19 06/17/2021 1350   ALT 23 06/17/2021 1350   ALKPHOS 108 06/17/2021 1350   BILITOT 0.5 06/17/2021 1350       Component Value Date/Time   WBC 8.3 06/17/2021 1350   RBC 5.12 06/17/2021 1350   HGB 15.7 06/17/2021 1350   HGB 15.2 05/05/2010 1049   HCT 46.4 06/17/2021 1350   HCT 44.8 05/05/2010 1049   PLT 229.0 06/17/2021 1350   PLT 193 05/05/2010 1049   MCV 90.7 06/17/2021 1350   MCV 89.6 05/05/2010 1049   MCH 30.4 05/05/2010 1049   MCHC 33.9 06/17/2021 1350   RDW 13.2 06/17/2021 1350   RDW 13.1 05/05/2010 1049   LYMPHSABS 2.6 06/17/2021 1350   LYMPHSABS 1.8 05/05/2010 1049   MONOABS 0.6 06/17/2021 1350   MONOABS 0.4 05/05/2010 1049   EOSABS 0.2 06/17/2021 1350   EOSABS 0.2 05/05/2010 1049   BASOSABS 0.1 06/17/2021 1350   BASOSABS 0.0 05/05/2010 1049    Lithium Lvl  Date Value Ref Range Status  02/02/2021 0.6 0.6 - 1.2 mmol/L Final   02/02/21 lithium level 0.6 on 900 mg daily  No results found for: "PHENYTOIN", "PHENOBARB", "VALPROATE", "CBMZ"    .res Assessment: Plan:     Jonathon Walker was seen today for follow-up, depression and anxiety.  Diagnoses and all orders for this visit:  Major depressive disorder with single episode, in partial remission (HCC) -     lithium carbonate 300 MG capsule; Take 2 capsules (600 mg total) by mouth at bedtime. -     fluvoxaMINE (LUVOX) 100 MG tablet; 1 tablet in the morning and 2 tablets at night  GAD (generalized anxiety disorder) -     fluvoxaMINE (LUVOX) 100 MG tablet; 1 tablet in the morning and 2 tablets at night  Mixed obsessional thoughts and acts -     fluvoxaMINE (LUVOX) 100 MG tablet; 1 tablet in the morning and 2 tablets at night  OSA (obstructive sleep apnea)   Chronic recurrent rumination with anxiety and urgency and sense of need and instablity with impatience but not quite desperate were better but anhedonic depression and anxious.  But not as painful as it was.   Chronic worry and overwhelmed easily and misattributes sx as SE of meds leading to inadequate med trials.  Fear of meds.  Anxiety was controlled by fluvoxamine 200 but now he thinks it is worse. So increase 300 mg daily.  Consider Auvelity  While the patient denies symptoms of grandiosity or euphoria he does describe some other manic cycling symptoms including increased interest, increased goal-directed behaviors, increased urge to spend that will last for 6 weeks or so and then stop.  He has these cycles about 3 times a year.  This is suggestive of a bipolar predisposition but I am not yet changing his diagnosis.   Continue CPAP use. New CPAP machine.  Falsely attributes his psych sx to the meds and then stops meds AMA.  False attribution of sx as SE.  Discussed potential metabolic side effects associated with atypical antipsychotics, as well as potential risk for movement side effects. Advised pt to contact office if movement side effects occur.  He agrees.  Call if you have any problems with this.  He remains  sober.  He wants to consider counseling to work through some personal issues as well as address specific symptoms like his anger and irritability. Rec local AA instead of online. Disc no BZ bc history alcoholism and high risk tolerance.  Wants out of jury duty DT anxiety.  Trial reduction of  lithium 900  to 600 mg daily to see if somatic sx are better like fatigue.   He admits to feeling worse when he stopped the lithium.  Call if sx get worse. Counseled patient regarding potential benefits, risks, and side effects of lithium to include potential risk of lithium affecting thyroid and renal function.  Discussed need for periodic lab monitoring to determine drug level and to assess for potential adverse effects.  Counseled patient regarding signs and symptoms of lithium toxicity and advised that they notify office immediately or seek urgent medical attention if experiencing these signs and symptoms.  Patient advised to contact office with any questions or concerns. 02/02/21  02/02/21 lithium level 0.6 on 900 mg daily, normal B12 and vitamin D 52 Check lithiium level.  Hasn't gotten.  Disc the off-label use of N-Acetylcysteine at 600 mg daily to help with mild cognitive problems.  It can be combined with a B-complex vitamin as the B-12 and folate have been shown to sometimes enhance the effect.  Partial benefit so increase NAC to 1200 mg daily.  Reads on internet about meds.  Disc this. Needs a lot of reassurance.  Constipation management 1.  Lots of water 2.  Powdered fiber supplement such as MiraLAX, Citrucel, etc. preferably with a meal 3.  2 stool softeners a day 4.  Milk of magnesia or magnesium tablets if needed  Reduce lithium to 2 capsules daily to see if physical symptoms like fatigue etc. are better Increase fluvoxamine to 1 tablet in the morning and 2 tablets in the evening to reduce anxiety  FU 8 weeks  Lynder Parents, MD, DFAPA   Please see After Visit Summary for patient  specific instructions.  Future Appointments  Date Time Provider [place]  07/26/2022  2:45 PM [facility] HEALTH ADVISOR [facility]    No orders of the defined types were placed in this encounter.       -------------------------------

## 2021-10-18 NOTE — Patient Instructions (Signed)
Reduce lithium to 2 capsules daily to see if physical symptoms like fatigue etc. are better Increase fluvoxamine to 1 tablet in the morning and 2 tablets in the evening to reduce anxiety

## 2021-10-22 ENCOUNTER — Telehealth: Payer: Self-pay | Admitting: Psychiatry

## 2021-10-22 NOTE — Telephone Encounter (Signed)
Mailed jury duty letter. Pt advised. Also, brought to Pt attention would not receive 10 days prior to Summons date 8/9. He understood. Texted Pt copy of summons per request.

## 2021-11-09 ENCOUNTER — Other Ambulatory Visit: Payer: Self-pay | Admitting: Adult Health

## 2021-12-02 ENCOUNTER — Other Ambulatory Visit: Payer: Self-pay | Admitting: Adult Health

## 2021-12-03 ENCOUNTER — Other Ambulatory Visit: Payer: Self-pay | Admitting: Adult Health

## 2021-12-03 DIAGNOSIS — L03032 Cellulitis of left toe: Secondary | ICD-10-CM | POA: Diagnosis not present

## 2021-12-03 DIAGNOSIS — M21622 Bunionette of left foot: Secondary | ICD-10-CM | POA: Diagnosis not present

## 2021-12-10 DIAGNOSIS — L03032 Cellulitis of left toe: Secondary | ICD-10-CM | POA: Diagnosis not present

## 2021-12-24 DIAGNOSIS — L03032 Cellulitis of left toe: Secondary | ICD-10-CM | POA: Diagnosis not present

## 2022-01-06 ENCOUNTER — Ambulatory Visit (INDEPENDENT_AMBULATORY_CARE_PROVIDER_SITE_OTHER): Payer: Medicare Other | Admitting: Psychiatry

## 2022-01-06 ENCOUNTER — Encounter: Payer: Self-pay | Admitting: Psychiatry

## 2022-01-06 DIAGNOSIS — G4733 Obstructive sleep apnea (adult) (pediatric): Secondary | ICD-10-CM | POA: Diagnosis not present

## 2022-01-06 DIAGNOSIS — F411 Generalized anxiety disorder: Secondary | ICD-10-CM

## 2022-01-06 DIAGNOSIS — F422 Mixed obsessional thoughts and acts: Secondary | ICD-10-CM | POA: Diagnosis not present

## 2022-01-06 DIAGNOSIS — R7989 Other specified abnormal findings of blood chemistry: Secondary | ICD-10-CM

## 2022-01-06 DIAGNOSIS — F324 Major depressive disorder, single episode, in partial remission: Secondary | ICD-10-CM

## 2022-01-06 DIAGNOSIS — F1021 Alcohol dependence, in remission: Secondary | ICD-10-CM

## 2022-01-06 MED ORDER — VITAMIN D (ERGOCALCIFEROL) 1.25 MG (50000 UNIT) PO CAPS: 50000.0000 [IU] | ORAL_CAPSULE | ORAL | 5 refills | Status: DC

## 2022-01-06 MED ORDER — CLOMIPRAMINE HCL 25 MG PO CAPS: ORAL_CAPSULE | ORAL | 1 refills | Status: DC

## 2022-01-06 MED ORDER — FLUVOXAMINE MALEATE 100 MG PO TABS: ORAL_TABLET | ORAL | 0 refills | Status: DC

## 2022-01-06 NOTE — Patient Instructions (Signed)
Reduce fluvoxamine to 2 daily for 1 week then reduce to 1 and 1/2 daily Start clomipramine 1 at night for 1 week then 2 at night for 1 week then 3 at night.

## 2022-01-06 NOTE — Progress Notes (Signed)
[name] [id number] [birth year] 56 y.o.  Subjective:   Patient ID:  Jonathon Walker is a 56 y.o. (DOB 15-Oct-1965) male.  Chief Complaint:  Chief Complaint  Patient presents with   Follow-up   Depression   Anxiety    Depression        Associated symptoms include fatigue.  Associated symptoms include no decreased concentration and no suicidal ideas.  Past medical history includes anxiety.   Anxiety Symptoms include nausea and nervous/anxious behavior. Patient reports no chest pain, confusion, decreased concentration or suicidal ideas.       Jonathon Walker presents to the office today for follow-up of anxiety , depression, alcohol.    seen August 7.  His anxiety was unmanaged at Paxil 40 mg and olanzapine 5 mg so olanzapine was increased to 7.5 daily.  seen December 17, 2018.  His anxiety was unmanaged.  The following change was made: Trial 1-1/2 of the 7.5 mg tablets of olanzapine for anxiety.  He is had a stay at [facility] for alcohol dependence since he was last here. Stayed 28 days and DC before Thanksgiving.  Very helpful.  Was having NM nighly before and they stopped since then.  Not drinking.  Involved in AA since then.  seen March 25, 2019.  The patient was doing better requested a reduction in olanzapine to 7.5 mg nightly to improve energy.  visit April 03, 2019.  He had remained sober and his anxiety was improved with sobriety.  He was having problems with low motivation and forgetfulness.  At the next refill it was decided olanzapine would be reduced from 7.5 to 5 mg daily. He called requesting this urgent appointment because of worsening symptoms associated with returning to work.  seen May 21, 2019.  He was doing relatively well.  The following was noted: Tolerating Wellbutrin and it seems to help. He didn't tolerate the increase. Anxiety is improved off alcohol.  Ok to reduce olanzapine to 2.5 mg daily for a month and stop it.  Hopefully low  motivation will improve.   He called back March 23 stating he was more depressed and having a harder time doing routine tasks and having suicidal thoughts.  Because of worsening depression after reducing the olanzapine he was encouraged to increase olanzapine back to 5 mg nightly. He called again March 24 stating he was really struggling but was scheduled for an appointment on March 26.  As of June 14, 2019 he reports the following: Every day a real hard struggle to get through the days.  More anxious and depressed equally. Worse 2 weeks.  Last Saturday so overwhelmed by how he feels he had SI.  Everything is hard. Initial benefit paroxetine has been lost.  Awakens stressed and tired. Adequate hours of sleep.  Will be major chore just to mow grass.  Still sober. SE sexual. Getting appt with CPAP doc. Changes made include: Increasing olanzapine back to 5 mg daily a couple of days prior to the appointment due to phone call and the addition of lithium 300 mg for a few days then increase to 600 mg nightly both to augment the antidepressant and because of suicidal thoughts.  July 15, 2019 appointment, the following is noted: Doing OK with both depression and anxiety.  Kind of like a top always going in mind.  Even when I sleep, when awakens mind still going.  NM nightly for years but less than before stopped drinking.  Smaller and less severe ones that  don't wake him.  Theme can't finish things.  Both depression and anxiety 3/10.  More productive.  Paces himself.  Can live with it.  Sleep 9 hours.  Initially olanzapine stopped the ruminating but it's back some.  Overall still benefit.  No SI and can't rmember why he had them before. Residual energy not great and some ruminating. He had some concerns about sexual side effects from Paxil but agreed that no med changes were necessary despite residual symptoms of depression and anxiety.  08/14/2019 appointment, the following is noted:  He scheduled  urgently. Vaccinated. Angry easily and exhausted and everything is a chore.  Exhausted. Never had appt with CPAP company.  CPAP is 57 years old and on same settings as in the past.  CPAP machine is not working properly but when it did it was helpful for alertness and energy.  Disc need to call them to have it evaluated. To bed 10-7.  Don't feel rested in morning but used to feel rested when first started paroxetine. Plan: Increase Lithium continue to 900 mg daily for irritability  11/19/19 appt with the following noted: He increased to 900 mg and then reduced it but doesn't know why.  Reduced it to 300 nightly. Irritability much better.  Once anger since here with rage but not outward. Still on paroxetine 40, olanzapine 5, Wellbutrin 75 AM. Getting tired in afternoon and worrying about getting depressed but he's not depressed now.  Don't enjoy things like he used to.  Les interest and excitement. Appetite and sleep are normal.   Still adjusting without alcohol and socialization. Worrying about lack of sex drive with paroxetine. Can still ruminate on things until he gets some reassurance through talking with people. Plan: Continue low-dose lithium 300 mg daily Continue Paxil.  Did not feel well at 60 mg so we will continue 40 mg.  Likely to lose anxiety benefit if change it. Continue low-dose Wellbutrin 75  Tolerating Wellbutrin and it seems to help. He didn't tolerate the increase.   Continue olanzapine 5 mg  02/18/2020 appointment with following noted: Open to trying increase paroxetine again bc ruminating wears him out and gets fatigued and it wears him out though is 80% better vs before paroxetine. Doesn't remember trying higher paroxetine.  Ruminates on relationships or work.  Whenever he can talk about it with someone it helps tremendously but also realizes paroxetine helps. Anxiety affects dreams and NM too.  Up and down a little.  Anxiety drove depression before paroxetine.  Best med he's  tried. SOBER 1 YEAR SE no problem.  Except gained 25# in 4 years.  No change in diet and exercise.  No increase in appetite and no food cravings.  Seeing PCP soon. Plan: Increase paroxetine trial to 60 for rumination.  Disc SE.  05/26/20 appt noted: No benefit increase paroxetine nor SE.  Wants to reduce back to 30 mg paroxetine and stop lithium bc it didn't seem to help.   4 days of Calm app has seemed to help rumination.  Does it in afternoon and before bed and it seemed to helps.  Doing a 10 min meditation and it helps.   Dep 4/10.  Anxiety 4/10. And a lot better the last few days.   Plan: Reduce lithium by 1 tablet per week.  Call if there is any suicidal thought. Starting April 1 reduce paroxetine to 40 mg daily. Continue low-dose Wellbutrin 75  Tolerating Wellbutrin and it seems to help. He didn't tolerate the increase.  If doing OK will try taper as he suggested at next visit. Continue olanzapine 5 mg nightly it was helpful for depression and anxiety.  07/01/2020 appt with following noted: Moved appt up. Now says he understated depression level when he was here the last time. Restarted lithium bc felt more depressed without it. Reduced paroxetine to 40 mg daily. Lethargic.  Mind won't calm down.  Nonrestorative sleep but 8 hours.  Doesn't think sleep is deep enough.  Tense. Goes to [employer]. Started counseling. Wants to try something different with meds. Can have mixed sx for 6 weeks with increased interest in things and motivation and want to spend money but still feels depressed.  3 times in the last year. Plan: Increase  lithium back to 3 daily to see if depression is better and less mood cycling.  He admits to feeling worse when he stopped the lithium Continue the reduce paroxetine to 45 mg daily. DC Wellbutrin Lamotrigine trial 25 mg to 100 mg daily over 4 weeks. Continue olanzapine 5 mg nightly it was helpful for depression and anxiety.  08/26/2020 appointment with  the following noted: Sad and down.  Classic depression sx and very fatigued.  Unusual to have classic depression bc usually is atypical.  No effect from lamotrigine. Wonders if atorvastatin is causing some confusion or depression bc read about it. Depressed for 3 mos. Gained 20#.  It's leveled off now. Olanzapine helped the rumination and anxiety.   Life is more depressing bc not seeing friends like it was bc stopped drinking.   No SE with lithium.  Unless dropping things. Sleep 1030 to 6 but doesn't sleep deep.  Not rested in AM Plan: Increase  lithium back to 3 daily to see if depression is better and less mood cycling.  He admits to feeling worse when he stopped the lithium Continue the reduce paroxetine to 40 mg daily bc it helped anxiety. Lamotrigine increase to 150 mg daily DT no benefit or SE Increase olanzapine10 mg nightly it was helpful for depression and anxiety.  Need more help with depression   09/11/2020 phone call: Pt stated he is foegetting things throughout the day and very unsteady.He works with tools and equptment and this is a problem.He stated he takes 6 Lamictal in the am. MD response: We made 3 med changes at the last visit including increasing lamotrigine to 150 mg daily, increasing olanzapine to 10 mg daily, and restarting lithium.  Any 1 of those changes could possibly cause some of the side effects.  We will have to make 1 change at a time to evaluate this.  Therefore reduce lamotrigine to 100 mg daily.  It may take a couple of weeks before he sees a difference.  11/13/2020 phone call: Patient lm stating he is currently taking Lamotrigine 150 mg. He has decreased it to 75 mg due to side effects per pt ( blurred vision, shaking and coordination issues). Patient stated he is discontinuing before his scheduled October appointment. MD response: Take the lamotrigine 75 mg for 2 weeks and he should be able to stop then without withdrawal.  If he gets shakey, nervous then we need  to reduce more slowly and let us know that.  Otherwise he can stop if he follows these directions.  11/30/2020 phone call: Pt called and advised he would like to taper off of the Olanzapine.  He has gained 8# since his last visit and 30# total since he started taking the med.  He feels  steady, not so much stress now, and would like to see if he can take Lorazepam instead.  Then talk to Dr. Clovis Pu in Oct when they meet. MD response: Reduce olanzapine to one half of the 10 mg tablet at night for 4 weeks and then stop it.  If he gets more depressed then get back in touch with [place] and we will try to do something about the weight gain that olanzapine can cause by using the alternative Lybalvi  12/07/2020 appointment with the following noted: Off lamotrigine .  Reduced olanzapine to 5 mg for a week.  On lithium 900 mg HS, paroxetine 40 Fatigue 10/10.  Dizziness resolved off lamotrigine.  Low fatigue and motivation.  "I want to want to do things".  Stopped drinking almost 18 mos and socially big adjustment.   Still invloved with AA and daily sponsor.  Working [employer].  [employer] helped. Thinks mild depression with little interest and motivation and no joy.  But not severe.  No SI. No anxiety and it's way better.  Life is more stable and helps.  Working 4 hours daily and occ extrea jobs. Uses new CPAP.  Stil drowsy and fatigue. Plan: Continue CPAP use. New CPAP machine. Trial modafinil 100-200 mg AM Lamotrigine off DT NR Reduced olanzapine to 5 mg daily for a week and well stop in 3 weeks DT wt gain and fatigue  01/20/2021 phone call: I called patient to see if I could get better information. He states he started modafinil a couple of weeks ago, though Rx was written for in September. He states he didn't feel well before taking the modafinil though. He just says he is agitated, irritable, and unstable feeling. When asked what he meant by unstable he stated feeling a "shell of myself".  He has been put on the  cancellation list.  MD response: It is likely the modafinil that is causing this problem.  But it may be corrected with a dose reduction.  I assume he is taking a whole tablet.  If that is the case time to reduce to 1/2 tablet in the morning.  If he is taking half a tablet tell him to reduce it to a quarter of a tablet.  And I agree we need to try to get him in as soon as possible. He reduced modafinil from 200 mg to 100 mg daily.  02/10/2021 appointment with the following noted: Current psych meds lithium 900 mg nightly, paroxetine 40 mg daily, olanzapine recently stopped, modafinil recently tried. Was really bad for awhile feeling depresssed and agitated and anxiety but better now including after reducing modafinil to 100 mg daily. Fatigue is better and in the middle now with energy, not good but not bad. Sleep is better.  CPAP doctor yesterday and they are gathering info pending. NAC is helping with memory. Still sober and committed to it. SE constipation  Plan: No med changes  03/10/2021 phone call from patient complaining of vague feelings of not feeling well and wanting the Paxil changed.  He was placed on the cancellation list.  03/18/2021 appointment urgently scheduled at his request: Remains on paroxetine 40 mg daily, lithium 1200 mg daily, modafinil 200 mg every morning. "Ruminating"  with sense of need to talk to someone.  Then feels better for awhile.  Mostly on relationships with family.  No one to talk to. Wonders need to chang ethe meds.   Don't socialize anymore since sober.  Does online AA everyday at noon. Lost  10# off olanzapine. Plan: Continue paroxetine 40 mg, lithium 1200 mg daily, Try stopping modafinil '100mg'$  AM to determine if it is helping or not. If not less anxious then start risperidone for rumination 1-2 mg HS  04/05/2021 appointment with the following noted: Stopped  modafinil and it helped reduce anxiety. Started risperidone 2 instead of 1 mg and was off  balance so reduced risperidone to 1 mg HS. Only done this for 3 nights.  No SE with it. Anxiety is not close to as bad as originally.  Can be painful but not now. Still ruminating some "my whole adult life" but varies.  Anxiety can lead to depression. 6-7/10 with 10 good on depression with some problems with ambition. Plan: Continue trial risperidone for rumination 1 mg HS  04/07/2021 TC: Several phone calls: Called 2 days after the visit complaining of itching from the lithium and wanting to come off of it.  He was warned he was more emotionally distressed when he reduced the lithium in the past but he wanted to pursue it anyway.  He was continuing risperidone 1 mg nightly but wanting to reduce that also but was warned he would almost certainly have worsening mood severity and anxiety problems based on past experience.  He was cautioned against reducing lithium below 900 mg daily and risperidone below 1 mg nightly.  He agreed on 04/09/2021.  04/09/2021 he called again and several phone calls ensued thereafter.  He insisted on stopping lithium and risperidone despite warnings that his mood stability and anxiety would worsen. He was prescribed Depakote to take the place  04/20/2021 appointment with the following noted: He stopped risperidone and did not take the Depakote.  He is taking lithium 900 mg nightly and paroxetine 40 mg daily. Stopped risperidone bc feeling agitated and unstable and blamed it.  Still feels the same off of it. Says itching 85% better with less lithium.  Itching for a couple of mos.  Doesn't think anxiety noticeably worse after reducing lithium to 900 mg daily. Not dizzy.  Clumsy with hands. Anxious but not painful like it was before Paxil but tense at the end of the day.  Initially felt normal for the first time in 20 years when first started paxil but not as good now. Not sadness and hasn't had much of it. Started therapy with doctor on demand. Ruminating on "fear of not  being fully present" and worries about little things like appts pending.   Afraid of working FT because gets overwhelmed bc "I take it home with me" Plan : Continue trial risperidone but increase to rumination 1.5 mg HS Continue paroxetine 40 and lithium 600 mg   06/01/2021 appointment with the following noted: Haven't seen benefit with risperidone 1.5 mg daily. Not in pain but feels disconnected and not myself.  Has felt like this off and on for years. Level of angst all day but not real high unless triggered.  Still has the rumination. Initially a lot of benefit from paroxetine Not markedly deprssed and not sad.  07/14/21 appt noted:  Doesn't feel much different with switch to fluvoxamine except anxiety well controlled. But still low energy, motivation and not as productive as he wants to be.  Not markedly sad.  Asks about Vraylar. Not much interest or enjoyment. Don't sleep that great and don't relax that great. On fluvoxamine 200, lithium 900, off risperidone. Plan: Anxiety controlled by fluvoxamine 200 Potentiate Vraylar 1.5 mg daily  07/27/2021 phone call complaining of ongoing depression.  Vraylar increased to 3 mg daily. 07/29/2021 phone call complaining of cost of Vraylar.  Was given samples. 08/20/2021 phone call complaining of fatigue.  Stated he stopped the Vraylar because it made him agitated.  It was recommended there be no further med changes because he is only been off the Vraylar for 1 week.  08/30/2021 appointment noted: Vraylar 1.5 mg didn't do much.  Increased to 3 mg and then felt irritable ans stopped it about 3rd week of May.  Better now. Still extremely tired and feels physically sick with normal workup. Thinks his mind wears him out.  Racing negative thoughts all the time. Asks about Ativan. Always nervous and anxious.  Trouble discerning anxiety from depression. Plan: Anxiety was controlled by fluvoxamine 200 but now he thinks it is worse. So increase 250 mg  daily  10/18/21 appt noted: No results with increase fluvoxamine.  No SE. Anxiety is still worse than depression.  Thinks it causes somatic sx like the flu.  Exhausted. GI. Poor sleep and concentration. Asks about lorazepam.   Got checked out by PCP without findings. Plan: Reduce lithium to 2 capsules daily to see if physical symptoms like fatigue etc. are better Increase fluvoxamine to 1 tablet in the morning and 2 tablets in the evening to reduce anxiety Plan Trial reduction of  lithium 900  to 600 mg daily to see if somatic sx are better like fatigue.   01/06/22 appt noted" Everything is about the same with med changes. CC anxiety and fatigue.  Sx started with severe depression in 20s and got better but never resolved. Thinks anxiety is causing fatigue.  Has to nap about 1 pm.  Easily fatigued.  No particular worry other than how he feels and himself..   Seeing [name] PhD  On disability for 7-8 years.  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  Sober 2 year 01/2019  Past psych meds:  Paxil 60 "too much",  duloxetine, Wellbutrin 75 (max tolerated),  Zoloft, Viibryd 60 NR,  Fluvoxamine 300 no better than 200 Abilify, Rexulti. Vraylar irritable at 3 mg daily.  Buspar stopped DT little effect. Xanax,  Olanzapine 10 fatigue Risperdal 2 once lithium  900, worse when he stopped lithium Lamotrigine NR CO dizzy NAC 600 helped Modafinil 200 agitated Took lorazepam  in the past, Xanax less helpful  Review of Systems:  Review of Systems  Constitutional:  Positive for fatigue. Negative for unexpected weight change.  Cardiovascular:  Negative for chest pain.  Gastrointestinal:  Positive for constipation and nausea.  Genitourinary:  Negative for urgency.  Neurological:  Negative for tremors.  Psychiatric/Behavioral:  Positive for dysphoric mood. Negative for agitation, behavioral problems, confusion, decreased concentration, hallucinations, self-injury, sleep disturbance and  suicidal ideas. The patient is nervous/anxious. The patient is not hyperactive.     Medications: I have reviewed the patient's current medications.  Current Outpatient Medications  Medication Sig Dispense Refill   amLODipine (NORVASC) 5 MG tablet TAKE 1 TABLET BY MOUTH EVERY DAY 90 tablet 3   atorvastatin (LIPITOR) 40 MG tablet TAKE 1 TABLET(40 MG) BY MOUTH DAILY 30 tablet 0   clomiPRAMINE (ANAFRANIL) 25 MG capsule 1 at night for 1 week, then 2 at night for 1 week then 3 at night 90 capsule 1   Cyanocobalamin (VITAMIN B-12 PO) Take by mouth.     lisinopril (ZESTRIL) 20 MG tablet TAKE 1 TABLET(20 MG) BY MOUTH DAILY 90 tablet 3   lithium carbonate 300 MG capsule Take 2 capsules (600  mg total) by mouth at bedtime. 180 capsule 0   fluvoxaMINE (LUVOX) 100 MG tablet 1/2 tablet in the AM and 1 tablet at night 135 tablet 0   risperiDONE (RISPERDAL) 3 MG tablet Take 0.5 tablets (1.5 mg total) by mouth at bedtime. (Patient not taking: Reported on 08/30/2021) 15 tablet 1   No current facility-administered medications for this visit.    Medication Side Effects: sexual, weight  Allergies: No Known Allergies  Past Medical History:  Diagnosis Date   Anxiety    Bimalleolar fracture of left ankle    Depression    Heart murmur    Sleep apnea    uses CPAP nightly    Family History  Problem Relation Age of Onset   Cancer Maternal Grandfather    Colon cancer Neg Hx    Colon polyps Neg Hx    Esophageal cancer Neg Hx    Rectal cancer Neg Hx    Stomach cancer Neg Hx     Social History   Socioeconomic History   Marital status: Single    Spouse name: Not on file   Number of children: Not on file   Years of education: Not on file   Highest education level: Not on file  Occupational History   Not on file  Tobacco Use   Smoking status: Every Day    Packs/day: 1.50    Years: 0.00    Total pack years: 0.00    Types: Cigarettes    Start date: 03/22/1975   Smokeless tobacco: Never   Tobacco  comments:    He reports he has quit 3 times and recently started back in 2012. Hsm   Vaping Use   Vaping Use: Never used  Substance and Sexual Activity   Alcohol use: Not Currently   Drug use: No   Sexual activity: Not on file  Other Topics Concern   Not on file  Social History Narrative   Not on file   Social Determinants of Health   Financial Resource Strain: Low Risk  (07/29/2021)   Overall Financial Resource Strain (CARDIA)    Difficulty of Paying Living Expenses: Not hard at all  Food Insecurity: No Food Insecurity (07/29/2021)   Hunger Vital Sign    Worried About Running Out of Food in the Last Year: Never true    Ran Out of Food in the Last Year: Never true  Transportation Needs: No Transportation Needs (07/29/2021)   PRAPARE - Hydrologist (Medical): No    Lack of Transportation (Non-Medical): No  Physical Activity: Inactive (07/29/2021)   Exercise Vital Sign    Days of Exercise per Week: 0 days    Minutes of Exercise per Session: 0 min  Stress: Stress Concern Present (07/29/2021)   [place]    Feeling of Stress : Rather much  Social Connections: Moderately Isolated (07/29/2021)   Social Connection and Isolation Panel [NHANES]    Frequency of Communication with Friends and Family: More than three times a week    Frequency of Social Gatherings with Friends and Family: More than three times a week    Attends Religious Services: Never    Marine scientist or Organizations: Yes    Attends Archivist Meetings: More than 4 times per year    Marital Status: Never married  Intimate Partner Violence: Not At Risk (07/29/2021)   Humiliation, Afraid, Rape, and Kick questionnaire    Fear of  Current or Ex-Partner: No    Emotionally Abused: No    Physically Abused: No    Sexually Abused: No    Past Medical History, Surgical history, Social history, and Family history were  reviewed and updated as appropriate.   Please see review of systems for further details on the patient's review from today.   Objective:   Physical Exam:  There were no vitals taken for this visit.  Physical Exam Constitutional:      General: He is not in acute distress.    Appearance: Normal appearance. He is well-developed.     Comments: Red face  Musculoskeletal:        General: No deformity.  Neurological:     Mental Status: He is alert and oriented to person, place, and time.     Motor: No tremor.     Coordination: Coordination normal.     Gait: Gait normal.  Psychiatric:        Attention and Perception: Attention normal. He is attentive.        Mood and Affect: Mood is anxious and depressed. Affect is not labile, blunt, angry or inappropriate.        Speech: Speech normal. Speech is not slurred.        Behavior: Behavior normal. Behavior is not agitated or slowed.        Thought Content: Thought content normal. Thought content is not delusional. Thought content does not include homicidal or suicidal ideation. Thought content does not include suicidal plan.        Cognition and Memory: Cognition normal.        Judgment: Judgment normal.     Comments: Insight  Fair. Somewhat chronic sense of urgency appears lworse but not severe. Chronic anxiety Complaining of anhedonic depression but anxiety is worse.      Lab Review:     Component Value Date/Time   NA 139 06/17/2021 1350   K 4.1 06/17/2021 1350   CL 104 06/17/2021 1350   CO2 28 06/17/2021 1350   GLUCOSE 90 06/17/2021 1350   BUN 17 06/17/2021 1350   CREATININE 1.30 06/17/2021 1350   CALCIUM 9.8 06/17/2021 1350   PROT 7.6 06/17/2021 1350   ALBUMIN 4.7 06/17/2021 1350   AST 19 06/17/2021 1350   ALT 23 06/17/2021 1350   ALKPHOS 108 06/17/2021 1350   BILITOT 0.5 06/17/2021 1350       Component Value Date/Time   WBC 8.3 06/17/2021 1350   RBC 5.12 06/17/2021 1350   HGB 15.7 06/17/2021 1350   HGB 15.2  05/05/2010 1049   HCT 46.4 06/17/2021 1350   HCT 44.8 05/05/2010 1049   PLT 229.0 06/17/2021 1350   PLT 193 05/05/2010 1049   MCV 90.7 06/17/2021 1350   MCV 89.6 05/05/2010 1049   MCH 30.4 05/05/2010 1049   MCHC 33.9 06/17/2021 1350   RDW 13.2 06/17/2021 1350   RDW 13.1 05/05/2010 1049   LYMPHSABS 2.6 06/17/2021 1350   LYMPHSABS 1.8 05/05/2010 1049   MONOABS 0.6 06/17/2021 1350   MONOABS 0.4 05/05/2010 1049   EOSABS 0.2 06/17/2021 1350   EOSABS 0.2 05/05/2010 1049   BASOSABS 0.1 06/17/2021 1350   BASOSABS 0.0 05/05/2010 1049    Lithium Lvl  Date Value Ref Range Status  02/02/2021 0.6 0.6 - 1.2 mmol/L Final   02/02/21 lithium level 0.6 on 900 mg daily  No results found for: "PHENYTOIN", "PHENOBARB", "VALPROATE", "CBMZ"   .res Assessment: Plan:     Harve was seen  today for follow-up, depression and anxiety.  Diagnoses and all orders for this visit:  Mixed obsessional thoughts and acts -     clomiPRAMINE (ANAFRANIL) 25 MG capsule; 1 at night for 1 week, then 2 at night for 1 week then 3 at night -     fluvoxaMINE (LUVOX) 100 MG tablet; 1/2 tablet in the AM and 1 tablet at night  Major depressive disorder with single episode, in partial remission (HCC) -     clomiPRAMINE (ANAFRANIL) 25 MG capsule; 1 at night for 1 week, then 2 at night for 1 week then 3 at night -     fluvoxaMINE (LUVOX) 100 MG tablet; 1/2 tablet in the AM and 1 tablet at night  GAD (generalized anxiety disorder) -     fluvoxaMINE (LUVOX) 100 MG tablet; 1/2 tablet in the AM and 1 tablet at night  OSA (obstructive sleep apnea)  Alcohol dependence in remission ([place])  Low vitamin D level   Chronic recurrent rumination with anxiety and urgency and sense of need and instablity with impatience but not quite desperate were better but anhedonic depression and anxious.  But not as painful as it was.   Chronic worry and overwhelmed easily and misattributes sx as SE of meds leading to inadequate med trials.   Fear of meds.  Anxiety was controlled by fluvoxamine 200 but now he thinks it is worse. So increase 300 mg daily.  Consider Auvelity  While the patient denies symptoms of grandiosity or euphoria he does describe some other manic cycling symptoms including increased interest, increased goal-directed behaviors, increased urge to spend that will last for 6 weeks or so and then stop.  He has these cycles about 3 times a year.  This is suggestive of a bipolar predisposition but I am not yet changing his diagnosis.   Continue CPAP use. New CPAP machine.  Falsely attributes his psych sx to the meds and then stops meds AMA.  False attribution of sx as SE.  Discussed potential metabolic side effects associated with atypical antipsychotics, as well as potential risk for movement side effects. Advised pt to contact office if movement side effects occur.  He agrees.  Call if you have any problems with this.  He remains sober.  He wants to consider counseling to work through some personal issues as well as address specific symptoms like his anger and irritability. Rec local AA instead of online. Disc no BZ bc history alcoholism and high risk tolerance.  Wants out of jury duty DT anxiety.  Trial reduction of  lithium 900  to 600 mg daily to see if somatic sx are better like fatigue.   He admits to feeling worse when he stopped the lithium.  Call if sx get worse. Counseled patient regarding potential benefits, risks, and side effects of lithium to include potential risk of lithium affecting thyroid and renal function.  Discussed need for periodic lab monitoring to determine drug level and to assess for potential adverse effects.  Counseled patient regarding signs and symptoms of lithium toxicity and advised that they notify office immediately or seek urgent medical attention if experiencing these signs and symptoms.  Patient advised to contact office with any questions or concerns. 02/02/21  02/02/21 lithium  level 0.6 on 900 mg daily, normal B12 and vitamin D 52 Check lithiium level.  Hasn't gotten.  Disc the off-label use of N-Acetylcysteine at 600 mg daily to help with mild cognitive problems.  It can be combined with a B-complex  vitamin as the B-12 and folate have been shown to sometimes enhance the effect.  Partial benefit so continue NAC 1200 mg daily.  Reads on internet about meds.  Disc this. Needs a lot of reassurance.  Constipation management 1.  Lots of water 2.  Powdered fiber supplement such as MiraLAX, Citrucel, etc. preferably with a meal 3.  2 stool softeners a day 4.  Milk of magnesia or magnesium tablets if needed  Reduce fluvoxamine gradually to 150 mg daily Start clomipramine 25 mg nightly for 5 days and increase to 75 mg HS  Continue vitamin D DT history level 30  FU 8 weeks  Lynder Parents, MD, DFAPA   Please see After Visit Summary for patient specific instructions.  Future Appointments  Date Time Provider [place]  07/26/2022  2:45 PM [facility] ADVISOR [facility]    No orders of the defined types were placed in this encounter.       -------------------------------

## 2022-01-07 ENCOUNTER — Other Ambulatory Visit: Payer: Self-pay | Admitting: Adult Health

## 2022-02-07 ENCOUNTER — Telehealth: Payer: Self-pay | Admitting: Psychiatry

## 2022-02-07 DIAGNOSIS — F422 Mixed obsessional thoughts and acts: Secondary | ICD-10-CM

## 2022-02-07 DIAGNOSIS — F411 Generalized anxiety disorder: Secondary | ICD-10-CM

## 2022-02-07 DIAGNOSIS — F324 Major depressive disorder, single episode, in partial remission: Secondary | ICD-10-CM

## 2022-02-07 NOTE — Telephone Encounter (Signed)
Increase the lithium back to 3 daily.  Stop the clomipramine.  Resume the risperidone until these symptoms clear up.  Call us back next week and let us know how it is going.

## 2022-02-07 NOTE — Telephone Encounter (Signed)
Patient said he has been on the 3 capsules of clomipramine 2-3 weeks.   He is fluvoxamine 1/2 tab AM, 1 PM Lithium 600 mg Risperdal on his med list from January, but is not taking that Vitamin D  He said he is "all over the place". He is thinking up inventions and trying to call people to help him get patents. He also reports overspending, but denies decreased sleep.   His last lithium level was 0.6 02/02/21.

## 2022-02-07 NOTE — Telephone Encounter (Signed)
Pt lvm that the clomiprame 25 mg has been taking the full dose for two weeks now. He is experiencing mania. Overspending etc. Please  call him at [phone]

## 2022-02-08 MED ORDER — RISPERIDONE 3 MG PO TABS: 1.5000 mg | ORAL_TABLET | Freq: Every day | ORAL | 1 refills | Status: DC

## 2022-02-08 NOTE — Telephone Encounter (Signed)
Notified of recommendations. Sent in Rx for Risperdal. Patient asked if he should still be taking 1.5 Luvox. I told him that Dr. Clovis Pu did not mention reducing that, only increasing lithium, stopping clomipramine, and restarting Risperdal.

## 2022-02-09 ENCOUNTER — Encounter: Payer: Self-pay | Admitting: Podiatry

## 2022-02-09 ENCOUNTER — Ambulatory Visit (INDEPENDENT_AMBULATORY_CARE_PROVIDER_SITE_OTHER): Payer: Medicare Other

## 2022-02-09 ENCOUNTER — Ambulatory Visit: Payer: Medicare Other | Admitting: Podiatry

## 2022-02-09 DIAGNOSIS — M778 Other enthesopathies, not elsewhere classified: Secondary | ICD-10-CM

## 2022-02-09 MED ORDER — TRIAMCINOLONE ACETONIDE 10 MG/ML IJ SUSP
10.0000 mg | Freq: Once | INTRAMUSCULAR | Status: AC
  Administered 2022-02-09: 10 mg

## 2022-02-12 NOTE — Progress Notes (Signed)
Subjective:   Patient ID: Jonathon Walker, male   DOB: 56 y.o.   MRN: [id number]   HPI Patient presents with chronic pain fifth metatarsal head left stating that he did well with that at last visit and it has been a year and a half since he has been here    ROS      Objective:  Physical Exam  Neurovascular status found to be intact muscle strength found to be adequate range of motion adequate with inflammation pain around the fifth MPJ left with fluid buildup around the area with moderate prominence of the bone structure     Assessment:  Chronic capsulitis of the fifth MPJ left related to bone structure creating pain     Plan:  H&P reviewed condition and went ahead today and gave sterile prep and injected the fifth MPJ and the plantar capsule 2 mg dexamethasone 5 mg Xylocaine advised on debridement padding reappoint to recheck as needed  X-rays indicate probability for prominence around the fifth metatarsal head no indications of arthritis around the joint surface

## 2022-02-14 ENCOUNTER — Telehealth: Payer: Self-pay | Admitting: Psychiatry

## 2022-02-14 NOTE — Telephone Encounter (Signed)
Jonathon Walker called today at 3: 30 and LM to report that since he has started to titrate off the medication he is feeling better and more clear headed.  He has about 1/2 of the bottle left.  He knows it may take more time for the mania to stop but wanted to report that to this point he does feel better.

## 2022-02-14 NOTE — Telephone Encounter (Signed)
He LVM @ 3:16p.  He stated what Leda Gauze shows, but also said he felt dizzy, which isn't like him.  He wants to know what Dr Clovis Pu wants him to do since he doesn't have an appt till Feb (actually it's Jan)

## 2022-02-15 NOTE — Telephone Encounter (Signed)
Please see messages.Pt is doing better but having some dizziness.Can this be from coming off med?

## 2022-02-15 NOTE — Telephone Encounter (Signed)
Clarify what medications he is taking currently.  Also what was the most recent medicine change and when was it?

## 2022-02-16 ENCOUNTER — Other Ambulatory Visit: Payer: Self-pay | Admitting: Podiatry

## 2022-02-16 ENCOUNTER — Other Ambulatory Visit: Payer: Self-pay | Admitting: Psychiatry

## 2022-02-16 DIAGNOSIS — F324 Major depressive disorder, single episode, in partial remission: Secondary | ICD-10-CM

## 2022-02-16 DIAGNOSIS — Z79899 Other long term (current) drug therapy: Secondary | ICD-10-CM

## 2022-02-16 DIAGNOSIS — M778 Other enthesopathies, not elsewhere classified: Secondary | ICD-10-CM

## 2022-02-16 NOTE — Telephone Encounter (Signed)
Pt informed

## 2022-02-16 NOTE — Telephone Encounter (Signed)
Addendum: on 11/20 You told him to stop clomipramine and increase lithium back to 3 tabs

## 2022-02-16 NOTE — Telephone Encounter (Signed)
I need him to go get a lithium level.  Ask him to do it in the morning after taking the lithium at night.  I sent the lab order to [facility]

## 2022-02-16 NOTE — Telephone Encounter (Signed)
Pt was just calling to give you an update.He stated he is still 50 percent manic but feels better.He is currently taking   lithium carbonate 300 MG 3 daily risperiDONE 3 mg 1/2 tab daily fluvoxamine 100 MG tablet 1/2 tablet in the AM and 1 tablet at night On 11/20 changes were made,you had him stop clomipramine

## 2022-02-21 ENCOUNTER — Encounter: Payer: Self-pay | Admitting: Podiatry

## 2022-02-21 ENCOUNTER — Ambulatory Visit: Payer: Medicare Other | Admitting: Podiatry

## 2022-02-21 DIAGNOSIS — L84 Corns and callosities: Secondary | ICD-10-CM | POA: Diagnosis not present

## 2022-02-21 NOTE — Progress Notes (Signed)
Subjective:   Patient ID: Jonathon Walker, male   DOB: 56 y.o.   MRN: [id number]   HPI Patient states it still bothering me underneath the left foot and I do have lesions underneath there   ROS      Objective:  Physical Exam  Neuro vascular status intact with severe foot structural issues with keratotic lesion subfirst metatarsal left subfifth with the first being worse     Assessment:  Chronic keratotic lesion with inflammatory component secondary to severe foot structure issues     Plan:  Sterile sharp debridement of several different lesions left no angiogenic bleeding reappoint to see back if needed applied padding to the first MPJ patient will be seen back as needed

## 2022-02-24 ENCOUNTER — Telehealth: Payer: Self-pay | Admitting: Psychiatry

## 2022-02-24 NOTE — Telephone Encounter (Signed)
Jonathon Walker called and and said that Dr. Clovis Pu wants him to have a lithium test done. Wants to inform that his lab appt is 03/01/22.

## 2022-02-25 ENCOUNTER — Telehealth: Payer: Self-pay | Admitting: Psychiatry

## 2022-02-25 NOTE — Telephone Encounter (Signed)
Patient lvm stating he is having a difficult time (living). He is inquiring if his Luvox can be adjusted. Appointment scheduled for 03/29/22  Contact information # [phone]

## 2022-02-25 NOTE — Telephone Encounter (Signed)
Pt stated he is having a hard time with depression.He denied suicidal thoughts.Tried to get more info as to the depressive symptoms and he said to just let you know that he was doing ok for a month with the luvox decrease and now he thinks it needs to be adjusted.He stated he is just in bed all day with no motivation.

## 2022-02-28 NOTE — Telephone Encounter (Signed)
Pt called again today LVM. Stated has not heard from anybody. Talked to somebody Friday to get back with me about CC idea. RTC [phone].  Having hard time.

## 2022-02-28 NOTE — Telephone Encounter (Signed)
Patient called 2 weeks ago complaining of being manic and now is complaining of being depressed.  I cannot make another med change until I get the lithium level result.  I think he is going for the test tomorrow.  We have put him on the cancellation list.  I need to have him in an appointment in order to make the best decision about how to proceed forward

## 2022-02-28 NOTE — Telephone Encounter (Signed)
Please review

## 2022-02-28 NOTE — Telephone Encounter (Signed)
Put him on cancellation list.   

## 2022-03-01 DIAGNOSIS — Z79899 Other long term (current) drug therapy: Secondary | ICD-10-CM | POA: Diagnosis not present

## 2022-03-01 NOTE — Telephone Encounter (Signed)
Pt stated he is now taking 2 of the luvox and all other meds are the same

## 2022-03-01 NOTE — Telephone Encounter (Signed)
Pt informed

## 2022-03-02 LAB — BASIC METABOLIC PANEL
BUN: 18 mg/dL (ref 7–25)
CO2: 27 mmol/L (ref 20–32)
Calcium: 9.6 mg/dL (ref 8.6–10.3)
Chloride: 107 mmol/L (ref 98–110)
Creat: 1.01 mg/dL (ref 0.70–1.30)
Glucose, Bld: 81 mg/dL (ref 65–99)
Potassium: 4 mmol/L (ref 3.5–5.3)
Sodium: 141 mmol/L (ref 135–146)

## 2022-03-02 LAB — LITHIUM LEVEL: Lithium Lvl: 0.5 mmol/L — ABNORMAL LOW (ref 0.6–1.2)

## 2022-03-02 LAB — TSH: TSH: 1.95 mIU/L (ref 0.40–4.50)

## 2022-03-03 NOTE — Telephone Encounter (Signed)
Pt stated he is already taking 300 mg 3 tabs daily

## 2022-03-03 NOTE — Telephone Encounter (Signed)
He has had recent mood swings from manic to depressed by his report.  Lithium level is low at 0.5 on 600 mg daily.  I think the most helpful thing for his mood would be to increase lithium back to 900 mg daily.

## 2022-03-07 ENCOUNTER — Telehealth: Payer: Self-pay | Admitting: Psychiatry

## 2022-03-07 ENCOUNTER — Other Ambulatory Visit: Payer: Self-pay | Admitting: Psychiatry

## 2022-03-07 DIAGNOSIS — F324 Major depressive disorder, single episode, in partial remission: Secondary | ICD-10-CM

## 2022-03-07 DIAGNOSIS — F422 Mixed obsessional thoughts and acts: Secondary | ICD-10-CM

## 2022-03-07 DIAGNOSIS — F411 Generalized anxiety disorder: Secondary | ICD-10-CM

## 2022-03-07 NOTE — Telephone Encounter (Signed)
Rx sent 

## 2022-03-07 NOTE — Telephone Encounter (Signed)
Next visit is 03/29/22. Jonathon Walker called requesting a refill on his Luvox. He has been taking 1 a day but has gone up to two a day. He said the two is helping him. Requesting refill on Luvox.   [id number] [address], [address] [place]   Phone: [phone]  Fax: [phone]

## 2022-03-08 ENCOUNTER — Ambulatory Visit (INDEPENDENT_AMBULATORY_CARE_PROVIDER_SITE_OTHER): Payer: Medicare Other | Admitting: Psychiatry

## 2022-03-08 ENCOUNTER — Ambulatory Visit: Payer: Medicare Other | Admitting: Psychiatry

## 2022-03-08 ENCOUNTER — Encounter: Payer: Self-pay | Admitting: Psychiatry

## 2022-03-08 DIAGNOSIS — Z79899 Other long term (current) drug therapy: Secondary | ICD-10-CM

## 2022-03-08 DIAGNOSIS — F411 Generalized anxiety disorder: Secondary | ICD-10-CM | POA: Diagnosis not present

## 2022-03-08 DIAGNOSIS — R7989 Other specified abnormal findings of blood chemistry: Secondary | ICD-10-CM

## 2022-03-08 DIAGNOSIS — F3181 Bipolar II disorder: Secondary | ICD-10-CM | POA: Diagnosis not present

## 2022-03-08 DIAGNOSIS — F1021 Alcohol dependence, in remission: Secondary | ICD-10-CM

## 2022-03-08 DIAGNOSIS — F422 Mixed obsessional thoughts and acts: Secondary | ICD-10-CM

## 2022-03-08 DIAGNOSIS — G4733 Obstructive sleep apnea (adult) (pediatric): Secondary | ICD-10-CM

## 2022-03-08 DIAGNOSIS — E538 Deficiency of other specified B group vitamins: Secondary | ICD-10-CM

## 2022-03-08 DIAGNOSIS — F324 Major depressive disorder, single episode, in partial remission: Secondary | ICD-10-CM

## 2022-03-08 MED ORDER — LITHIUM CARBONATE 300 MG PO CAPS: 1200.0000 mg | ORAL_CAPSULE | Freq: Every evening | ORAL | 0 refills | Status: DC

## 2022-03-08 MED ORDER — FLUVOXAMINE MALEATE 100 MG PO TABS: 200.0000 mg | ORAL_TABLET | Freq: Every day | ORAL | 0 refills | Status: DC

## 2022-03-08 NOTE — Progress Notes (Signed)
Jonathon Walker [id number] 19-Sep-1965 56 y.o.  Subjective:   Patient ID:  Jonathon Walker is a 56 y.o. (DOB 04-18-1965) male.  Chief Complaint:  Chief Complaint  Patient presents with   Follow-up   Depression   Anxiety   Manic Behavior    Depression        Associated symptoms include fatigue.  Associated symptoms include no decreased concentration and no suicidal ideas.  Past medical history includes anxiety.   Anxiety Symptoms include nausea and nervous/anxious behavior. Patient reports no chest pain, confusion, decreased concentration or suicidal ideas.       Jonathon Walker presents to the office today for follow-up of anxiety , depression, alcohol.    seen August 7.  His anxiety was unmanaged at Paxil 40 mg and olanzapine 5 mg so olanzapine was increased to 7.5 daily.  seen December 17, 2018.  His anxiety was unmanaged.  The following change was made: Trial 1-1/2 of the 7.5 mg tablets of olanzapine for anxiety.  He is had a stay at [facility] for alcohol dependence since he was last here. Stayed 28 days and DC before Thanksgiving.  Very helpful.  Was having NM nighly before and they stopped since then.  Not drinking.  Involved in AA since then.  seen March 25, 2019.  The patient was doing better requested a reduction in olanzapine to 7.5 mg nightly to improve energy.  visit April 03, 2019.  He had remained sober and his anxiety was improved with sobriety.  He was having problems with low motivation and forgetfulness.  At the next refill it was decided olanzapine would be reduced from 7.5 to 5 mg daily. He called requesting this urgent appointment because of worsening symptoms associated with returning to work.  seen May 21, 2019.  He was doing relatively well.  The following was noted: Tolerating Wellbutrin and it seems to help. He didn't tolerate the increase. Anxiety is improved off alcohol.  Ok to reduce olanzapine to 2.5 mg daily for a month and stop it.   Hopefully low motivation will improve.   He called back March 23 stating he was more depressed and having a harder time doing routine tasks and having suicidal thoughts.  Because of worsening depression after reducing the olanzapine he was encouraged to increase olanzapine back to 5 mg nightly. He called again March 24 stating he was really struggling but was scheduled for an appointment on March 26.  As of June 14, 2019 he reports the following: Every day a real hard struggle to get through the days.  More anxious and depressed equally. Worse 2 weeks.  Last Saturday so overwhelmed by how he feels he had SI.  Everything is hard. Initial benefit paroxetine has been lost.  Awakens stressed and tired. Adequate hours of sleep.  Will be major chore just to mow grass.  Still sober. SE sexual. Getting appt with CPAP doc. Changes made include: Increasing olanzapine back to 5 mg daily a couple of days prior to the appointment due to phone call and the addition of lithium 300 mg for a few days then increase to 600 mg nightly both to augment the antidepressant and because of suicidal thoughts.  July 15, 2019 appointment, the following is noted: Doing OK with both depression and anxiety.  Kind of like a top always going in mind.  Even when I sleep, when awakens mind still going.  NM nightly for years but less than before stopped drinking.  Smaller and  less severe ones that don't wake him.  Theme can't finish things.  Both depression and anxiety 3/10.  More productive.  Paces himself.  Can live with it.  Sleep 9 hours.  Initially olanzapine stopped the ruminating but it's back some.  Overall still benefit.  No SI and can't rmember why he had them before. Residual energy not great and some ruminating. He had some concerns about sexual side effects from Paxil but agreed that no med changes were necessary despite residual symptoms of depression and anxiety.  08/14/2019 appointment, the following is noted:  He  scheduled urgently. Vaccinated. Angry easily and exhausted and everything is a chore.  Exhausted. Never had appt with CPAP company.  CPAP is 56 years old and on same settings as in the past.  CPAP machine is not working properly but when it did it was helpful for alertness and energy.  Disc need to call them to have it evaluated. To bed 10-7.  Don't feel rested in morning but used to feel rested when first started paroxetine. Plan: Increase Lithium continue to 900 mg daily for irritability  11/19/19 appt with the following noted: He increased to 900 mg and then reduced it but doesn't know why.  Reduced it to 300 nightly. Irritability much better.  Once anger since here with rage but not outward. Still on paroxetine 40, olanzapine 5, Wellbutrin 75 AM. Getting tired in afternoon and worrying about getting depressed but he's not depressed now.  Don't enjoy things like he used to.  Les interest and excitement. Appetite and sleep are normal.   Still adjusting without alcohol and socialization. Worrying about lack of sex drive with paroxetine. Can still ruminate on things until he gets some reassurance through talking with people. Plan: Continue low-dose lithium 300 mg daily Continue Paxil.  Did not feel well at 60 mg so we will continue 40 mg.  Likely to lose anxiety benefit if change it. Continue low-dose Wellbutrin 75  Tolerating Wellbutrin and it seems to help. He didn't tolerate the increase.   Continue olanzapine 5 mg  02/18/2020 appointment with following noted: Open to trying increase paroxetine again bc ruminating wears him out and gets fatigued and it wears him out though is 80% better vs before paroxetine. Doesn't remember trying higher paroxetine.  Ruminates on relationships or work.  Whenever he can talk about it with someone it helps tremendously but also realizes paroxetine helps. Anxiety affects dreams and NM too.  Up and down a little.  Anxiety drove depression before paroxetine.   Best med he's tried. SOBER 1 YEAR SE no problem.  Except gained 25# in 4 years.  No change in diet and exercise.  No increase in appetite and no food cravings.  Seeing PCP soon. Plan: Increase paroxetine trial to 60 for rumination.  Disc SE.  05/26/20 appt noted: No benefit increase paroxetine nor SE.  Wants to reduce back to 30 mg paroxetine and stop lithium bc it didn't seem to help.   4 days of Calm app has seemed to help rumination.  Does it in afternoon and before bed and it seemed to helps.  Doing a 10 min meditation and it helps.   Dep 4/10.  Anxiety 4/10. And a lot better the last few days.   Plan: Reduce lithium by 1 tablet per week.  Call if there is any suicidal thought. Starting April 1 reduce paroxetine to 40 mg daily. Continue low-dose Wellbutrin 75  Tolerating Wellbutrin and it seems to help. He  didn't tolerate the increase.   If doing OK will try taper as he suggested at next visit. Continue olanzapine 5 mg nightly it was helpful for depression and anxiety.  07/01/2020 appt with following noted: Moved appt up. Now says he understated depression level when he was here the last time. Restarted lithium bc felt more depressed without it. Reduced paroxetine to 40 mg daily. Lethargic.  Mind won't calm down.  Nonrestorative sleep but 8 hours.  Doesn't think sleep is deep enough.  Tense. Goes to [employer]. Started counseling. Wants to try something different with meds. Can have mixed sx for 6 weeks with increased interest in things and motivation and want to spend money but still feels depressed.  3 times in the last year. Plan: Increase  lithium back to 3 daily to see if depression is better and less mood cycling.  He admits to feeling worse when he stopped the lithium Continue the reduce paroxetine to 45 mg daily. DC Wellbutrin Lamotrigine trial 25 mg to 100 mg daily over 4 weeks. Continue olanzapine 5 mg nightly it was helpful for depression and anxiety.  08/26/2020  appointment with the following noted: Sad and down.  Classic depression sx and very fatigued.  Unusual to have classic depression bc usually is atypical.  No effect from lamotrigine. Wonders if atorvastatin is causing some confusion or depression bc read about it. Depressed for 3 mos. Gained 20#.  It's leveled off now. Olanzapine helped the rumination and anxiety.   Life is more depressing bc not seeing friends like it was bc stopped drinking.   No SE with lithium.  Unless dropping things. Sleep 1030 to 6 but doesn't sleep deep.  Not rested in AM Plan: Increase  lithium back to 3 daily to see if depression is better and less mood cycling.  He admits to feeling worse when he stopped the lithium Continue the reduce paroxetine to 40 mg daily bc it helped anxiety. Lamotrigine increase to 150 mg daily DT no benefit or SE Increase olanzapine10 mg nightly it was helpful for depression and anxiety.  Need more help with depression   09/11/2020 phone call: Pt stated he is foegetting things throughout the day and very unsteady.He works with tools and equptment and this is a problem.He stated he takes 6 Lamictal in the am. MD response: We made 3 med changes at the last visit including increasing lamotrigine to 150 mg daily, increasing olanzapine to 10 mg daily, and restarting lithium.  Any 1 of those changes could possibly cause some of the side effects.  We will have to make 1 change at a time to evaluate this.  Therefore reduce lamotrigine to 100 mg daily.  It may take a couple of weeks before he sees a difference.  11/13/2020 phone call: Patient lm stating he is currently taking Lamotrigine 150 mg. He has decreased it to 75 mg due to side effects per pt ( blurred vision, shaking and coordination issues). Patient stated he is discontinuing before his scheduled October appointment. MD response: Take the lamotrigine 75 mg for 2 weeks and he should be able to stop then without withdrawal.  If he gets shakey,  nervous then we need to reduce more slowly and let us know that.  Otherwise he can stop if he follows these directions.  11/30/2020 phone call: Pt called and advised he would like to taper off of the Olanzapine.  He has gained 8# since his last visit and 30# total since he started  taking the med.  He feels steady, not so much stress now, and would like to see if he can take Lorazepam instead.  Then talk to Dr. Clovis Pu in Oct when they meet. MD response: Reduce olanzapine to one half of the 10 mg tablet at night for 4 weeks and then stop it.  If he gets more depressed then get back in touch with [place] and we will try to do something about the weight gain that olanzapine can cause by using the alternative Lybalvi  12/07/2020 appointment with the following noted: Off lamotrigine .  Reduced olanzapine to 5 mg for a week.  On lithium 900 mg HS, paroxetine 40 Fatigue 10/10.  Dizziness resolved off lamotrigine.  Low fatigue and motivation.  "I want to want to do things".  Stopped drinking almost 18 mos and socially big adjustment.   Still invloved with AA and daily sponsor.  Working [employer].  Jonathon Walker helped. Thinks mild depression with little interest and motivation and no joy.  But not severe.  No SI. No anxiety and it's way better.  Life is more stable and helps.  Working 4 hours daily and occ extrea jobs. Uses new CPAP.  Stil drowsy and fatigue. Plan: Continue CPAP use. New CPAP machine. Trial modafinil 100-200 mg AM Lamotrigine off DT NR Reduced olanzapine to 5 mg daily for a week and well stop in 3 weeks DT wt gain and fatigue  01/20/2021 phone call: I called patient to see if I could get better information. He states he started modafinil a couple of weeks ago, though Rx was written for in September. He states he didn't feel well before taking the modafinil though. He just says he is agitated, irritable, and unstable feeling. When asked what he meant by unstable he stated feeling a "shell of myself".  He  has been put on the cancellation list.  MD response: It is likely the modafinil that is causing this problem.  But it may be corrected with a dose reduction.  I assume he is taking a whole tablet.  If that is the case time to reduce to 1/2 tablet in the morning.  If he is taking half a tablet tell him to reduce it to a quarter of a tablet.  And I agree we need to try to get him in as soon as possible. He reduced modafinil from 200 mg to 100 mg daily.  02/10/2021 appointment with the following noted: Current psych meds lithium 900 mg nightly, paroxetine 40 mg daily, olanzapine recently stopped, modafinil recently tried. Was really bad for awhile feeling depresssed and agitated and anxiety but better now including after reducing modafinil to 100 mg daily. Fatigue is better and in the middle now with energy, not good but not bad. Sleep is better.  CPAP doctor yesterday and they are gathering info pending. NAC is helping with memory. Still sober and committed to it. SE constipation  Plan: No med changes  03/10/2021 phone call from patient complaining of vague feelings of not feeling well and wanting the Paxil changed.  He was placed on the cancellation list.  03/18/2021 appointment urgently scheduled at his request: Remains on paroxetine 40 mg daily, lithium 1200 mg daily, modafinil 200 mg every morning. "Ruminating"  with sense of need to talk to someone.  Then feels better for awhile.  Mostly on relationships with family.  No one to talk to. Wonders need to chang ethe meds.   Don't socialize anymore since sober.  Does  online AA everyday at noon. Lost 10# off olanzapine. Plan: Continue paroxetine 40 mg, lithium 1200 mg daily, Try stopping modafinil '100mg'$  AM to determine if it is helping or not. If not less anxious then start risperidone for rumination 1-2 mg HS  04/05/2021 appointment with the following noted: Stopped  modafinil and it helped reduce anxiety. Started risperidone 2 instead of  1 mg and was off balance so reduced risperidone to 1 mg HS. Only done this for 3 nights.  No SE with it. Anxiety is not close to as bad as originally.  Can be painful but not now. Still ruminating some "my whole adult life" but varies.  Anxiety can lead to depression. 6-7/10 with 10 good on depression with some problems with ambition. Plan: Continue trial risperidone for rumination 1 mg HS  04/07/2021 TC: Several phone calls: Called 2 days after the visit complaining of itching from the lithium and wanting to come off of it.  He was warned he was more emotionally distressed when he reduced the lithium in the past but he wanted to pursue it anyway.  He was continuing risperidone 1 mg nightly but wanting to reduce that also but was warned he would almost certainly have worsening mood severity and anxiety problems based on past experience.  He was cautioned against reducing lithium below 900 mg daily and risperidone below 1 mg nightly.  He agreed on 04/09/2021.  04/09/2021 he called again and several phone calls ensued thereafter.  He insisted on stopping lithium and risperidone despite warnings that his mood stability and anxiety would worsen. He was prescribed Depakote to take the place  04/20/2021 appointment with the following noted: He stopped risperidone and did not take the Depakote.  He is taking lithium 900 mg nightly and paroxetine 40 mg daily. Stopped risperidone bc feeling agitated and unstable and blamed it.  Still feels the same off of it. Says itching 85% better with less lithium.  Itching for a couple of mos.  Doesn't think anxiety noticeably worse after reducing lithium to 900 mg daily. Not dizzy.  Clumsy with hands. Anxious but not painful like it was before Paxil but tense at the end of the day.  Initially felt normal for the first time in 20 years when first started paxil but not as good now. Not sadness and hasn't had much of it. Started therapy with doctor on demand. Ruminating on  "fear of not being fully present" and worries about little things like appts pending.   Afraid of working FT because gets overwhelmed bc "I take it home with me" Plan : Continue trial risperidone but increase to rumination 1.5 mg HS Continue paroxetine 40 and lithium 600 mg   06/01/2021 appointment with the following noted: Haven't seen benefit with risperidone 1.5 mg daily. Not in pain but feels disconnected and not myself.  Has felt like this off and on for years. Level of angst all day but not real high unless triggered.  Still has the rumination. Initially a lot of benefit from paroxetine Not markedly deprssed and not sad.  07/14/21 appt noted:  Doesn't feel much different with switch to fluvoxamine except anxiety well controlled. But still low energy, motivation and not as productive as he wants to be.  Not markedly sad.  Asks about Vraylar. Not much interest or enjoyment. Don't sleep that great and don't relax that great. On fluvoxamine 200, lithium 900, off risperidone. Plan: Anxiety controlled by fluvoxamine 200 Potentiate Vraylar 1.5 mg daily  07/27/2021 phone  call complaining of ongoing depression.  Vraylar increased to 3 mg daily. 07/29/2021 phone call complaining of cost of Vraylar.  Was given samples. 08/20/2021 phone call complaining of fatigue.  Stated he stopped the Vraylar because it made him agitated.  It was recommended there be no further med changes because he is only been off the Vraylar for 1 week.  08/30/2021 appointment noted: Vraylar 1.5 mg didn't do much.  Increased to 3 mg and then felt irritable ans stopped it about 3rd week of May.  Better now. Still extremely tired and feels physically sick with normal workup. Thinks his mind wears him out.  Racing negative thoughts all the time. Asks about Ativan. Always nervous and anxious.  Trouble discerning anxiety from depression. Plan: Anxiety was controlled by fluvoxamine 200 but now he thinks it is worse. So increase 250  mg daily  10/18/21 appt noted: No results with increase fluvoxamine.  No SE. Anxiety is still worse than depression.  Thinks it causes somatic sx like the flu.  Exhausted. GI. Poor sleep and concentration. Asks about lorazepam.   Got checked out by PCP without findings. Plan: Reduce lithium to 2 capsules daily to see if physical symptoms like fatigue etc. are better Increase fluvoxamine to 1 tablet in the morning and 2 tablets in the evening to reduce anxiety Plan Trial reduction of  lithium 900  to 600 mg daily to see if somatic sx are better like fatigue.   01/06/22 appt noted" Everything is about the same with med changes. CC anxiety and fatigue.  Sx started with severe depression in 20s and got better but never resolved. Thinks anxiety is causing fatigue.  Has to nap about 1 pm.  Easily fatigued.  No particular worry other than how he feels and himself.. Plan: Reduce fluvoxamine gradually to 150 mg daily Start clomipramine 25 mg nightly for 5 days and increase to 75 mg HS  02/07/22 TC:  Pt lvm that the clomiprame 25 mg has been taking the full dose for two weeks now. He is experiencing mania. Overspending etc    MD resp:  Increase the lithium back to 3 daily.  Stop the clomipramine.  Resume the risperidone until these symptoms clear up.  Call us back next week and let us know how it is going.     02/16/22 TC:   Pt was just calling to give you an update.He stated he is still 50 percent manic but feels better.He is currently taking   lithium carbonate 300 MG 3 daily risperiDONE 3 mg 1/2 tab daily fluvoxamine 100 MG tablet 1/2 tablet in the AM and 1 tablet at night On 11/20 changes were made,you had him stop clomipramine                     and increase lithium to 900 mg daily.  02/16/22 MD: check lithium level.  02/24/22 tC complaining of depression. MD :  He has had recent mood swings from manic to depressed by his report.  Lithium level is low at 0.5 on 600 mg daily.  I think  the most helpful thing for his mood would be to increase lithium back to 900 mg daily.      03/07/22 TC: pt reported increasing his Luvox to 200 mg daily.  03/08/22 appt noted: Multiple phone calls since ehre.   Clomipramine triggered mania like he's never had before.  Increased over the top looking for a house, wanting to invent things, bought a shed  and fish tank abruptly.  Left his job which was a good thing.  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  I feel a lot better about it.  Has disability and will do PT work.  I'll be OK. Exhausted and can't be on his feet all the time.   Sleep is different with EMA and ready to go but watches TV awhile and then goes back to sleep.  Always a late sleeper all his life.   Still feeling some depression and he increased the luvox to 200 and it helped. Thinks parents are Autistic and don't respond emotionally.  M cannot understand feelings and he doesn't get any support.    Seeing Jonathon Moore PhD  On disability for 7-8 years.  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  Sober 2 year 01/2019  Past psych meds:  Paxil 60 "too much",  duloxetine, Wellbutrin 75 (max tolerated),  Zoloft, Viibryd 60 NR,  Fluvoxamine 300 no better than 200 Abilify, Rexulti. Vraylar irritable at 3 mg daily.  Buspar stopped DT little effect. Xanax,  Olanzapine 10 fatigue Risperdal 2 once lithium  900, worse when he stopped lithium Lamotrigine NR CO dizzy NAC 600 helped Modafinil 200 agitated Took lorazepam  in the past, Xanax less helpful  Clomipramine mania  Review of Systems:  Review of Systems  Constitutional:  Positive for fatigue. Negative for unexpected weight change.  Cardiovascular:  Negative for chest pain.  Gastrointestinal:  Positive for constipation and nausea.  Genitourinary:  Negative for urgency.  Neurological:  Negative for tremors.  Psychiatric/Behavioral:  Positive for dysphoric mood. Negative for agitation, behavioral problems, confusion,  decreased concentration, hallucinations, self-injury, sleep disturbance and suicidal ideas. The patient is nervous/anxious. The patient is not hyperactive.     Medications: I have reviewed the patient's current medications.  Current Outpatient Medications  Medication Sig Dispense Refill   amLODipine (NORVASC) 5 MG tablet TAKE 1 TABLET BY MOUTH EVERY DAY 90 tablet 3   atorvastatin (LIPITOR) 40 MG tablet TAKE 1 TABLET(40 MG) BY MOUTH DAILY 90 tablet 1   lisinopril (ZESTRIL) 20 MG tablet TAKE 1 TABLET(20 MG) BY MOUTH DAILY 90 tablet 3   risperiDONE (RISPERDAL) 3 MG tablet Take 0.5 tablets (1.5 mg total) by mouth at bedtime. 15 tablet 1   Vitamin D, Ergocalciferol, (DRISDOL) 1.25 MG (50000 UNIT) CAPS capsule Take 1 capsule (50,000 Units total) by mouth every 7 (seven) days. 5 capsule 5   fluvoxaMINE (LUVOX) 100 MG tablet Take 2 tablets (200 mg total) by mouth daily. 180 tablet 0   lithium carbonate 300 MG capsule Take 4 capsules (1,200 mg total) by mouth at bedtime. 360 capsule 0   No current facility-administered medications for this visit.    Medication Side Effects: sexual, weight  Allergies: No Known Allergies  Past Medical History:  Diagnosis Date   Anxiety    Bimalleolar fracture of left ankle    Depression    Heart murmur    Sleep apnea    uses CPAP nightly    Family History  Problem Relation Age of Onset   Cancer Maternal Grandfather    Colon cancer Neg Hx    Colon polyps Neg Hx    Esophageal cancer Neg Hx    Rectal cancer Neg Hx    Stomach cancer Neg Hx     Social History   Socioeconomic History   Marital status: Single    Spouse name: Not on file   Number of children: Not  on file   Years of education: Not on file   Highest education level: Not on file  Occupational History   Not on file  Tobacco Use   Smoking status: Every Day    Packs/day: 1.50    Years: 0.00    Total pack years: 0.00    Types: Cigarettes    Start date: 03/22/1975   Smokeless tobacco:  Never   Tobacco comments:    He reports he has quit 3 times and recently started back in 2012. Hsm   Vaping Use   Vaping Use: Never used  Substance and Sexual Activity   Alcohol use: Not Currently   Drug use: No   Sexual activity: Not on file  Other Topics Concern   Not on file  Social History Narrative   Not on file   Social Determinants of Health   Financial Resource Strain: Low Risk  (07/29/2021)   Overall Financial Resource Strain (CARDIA)    Difficulty of Paying Living Expenses: Not hard at all  Food Insecurity: No Food Insecurity (07/29/2021)   Hunger Vital Sign    Worried About Running Out of Food in the Last Year: Never true    Ran Out of Food in the Last Year: Never true  Transportation Needs: No Transportation Needs (07/29/2021)   PRAPARE - Hydrologist (Medical): No    Lack of Transportation (Non-Medical): No  Physical Activity: Inactive (07/29/2021)   Exercise Vital Sign    Days of Exercise per Week: 0 days    Minutes of Exercise per Session: 0 min  Stress: Stress Concern Present (07/29/2021)   [place]    Feeling of Stress : Rather much  Social Connections: Moderately Isolated (07/29/2021)   Social Connection and Isolation Panel [NHANES]    Frequency of Communication with Friends and Family: More than three times a week    Frequency of Social Gatherings with Friends and Family: More than three times a week    Attends Religious Services: Never    Marine scientist or Organizations: Yes    Attends Music therapist: More than 4 times per year    Marital Status: Never married  Intimate Partner Violence: Not At Risk (07/29/2021)   Humiliation, Afraid, Rape, and Kick questionnaire    Fear of Current or Ex-Partner: No    Emotionally Abused: No    Physically Abused: No    Sexually Abused: No    Past Medical History, Surgical history, Social history, and  Family history were reviewed and updated as appropriate.   Please see review of systems for further details on the patient's review from today.   Objective:   Physical Exam:  There were no vitals taken for this visit.  Physical Exam Constitutional:      General: He is not in acute distress.    Appearance: Normal appearance. He is well-developed.     Comments: Red face  Musculoskeletal:        General: No deformity.  Neurological:     Mental Status: He is alert and oriented to person, place, and time.     Motor: No tremor.     Coordination: Coordination normal.     Gait: Gait normal.  Psychiatric:        Attention and Perception: Attention normal. He is attentive.        Mood and Affect: Mood is anxious and depressed. Affect is not labile, blunt, angry  or inappropriate.        Speech: Speech normal. Speech is not slurred.        Behavior: Behavior normal. Behavior is not agitated or slowed.        Thought Content: Thought content normal. Thought content is not delusional. Thought content does not include homicidal or suicidal ideation. Thought content does not include suicidal plan.        Cognition and Memory: Cognition normal.        Judgment: Judgment normal.     Comments: Insight  Fair. Somewhat chronic sense of urgency appears lworse but not severe. Chronic anxiety Complaining of anhedonic depression but anxiety is worse.      Lab Review:     Component Value Date/Time   NA 141 03/01/2022 0904   K 4.0 03/01/2022 0904   CL 107 03/01/2022 0904   CO2 27 03/01/2022 0904   GLUCOSE 81 03/01/2022 0904   BUN 18 03/01/2022 0904   CREATININE 1.01 03/01/2022 0904   CALCIUM 9.6 03/01/2022 0904   PROT 7.6 06/17/2021 1350   ALBUMIN 4.7 06/17/2021 1350   AST 19 06/17/2021 1350   ALT 23 06/17/2021 1350   ALKPHOS 108 06/17/2021 1350   BILITOT 0.5 06/17/2021 1350       Component Value Date/Time   WBC 8.3 06/17/2021 1350   RBC 5.12 06/17/2021 1350   HGB 15.7 06/17/2021  1350   HGB 15.2 05/05/2010 1049   HCT 46.4 06/17/2021 1350   HCT 44.8 05/05/2010 1049   PLT 229.0 06/17/2021 1350   PLT 193 05/05/2010 1049   MCV 90.7 06/17/2021 1350   MCV 89.6 05/05/2010 1049   MCH 30.4 05/05/2010 1049   MCHC 33.9 06/17/2021 1350   RDW 13.2 06/17/2021 1350   RDW 13.1 05/05/2010 1049   LYMPHSABS 2.6 06/17/2021 1350   LYMPHSABS 1.8 05/05/2010 1049   MONOABS 0.6 06/17/2021 1350   MONOABS 0.4 05/05/2010 1049   EOSABS 0.2 06/17/2021 1350   EOSABS 0.2 05/05/2010 1049   BASOSABS 0.1 06/17/2021 1350   BASOSABS 0.0 05/05/2010 1049    Lithium Lvl  Date Value Ref Range Status  03/01/2022 0.5 (L) 0.6 - 1.2 mmol/L Final  03/01/22 lithium 0.5 on 9-00 mg daily   02/02/21 lithium level 0.6 on 900 mg daily  No results found for: "PHENYTOIN", "PHENOBARB", "VALPROATE", "CBMZ"   .res Assessment: Plan:     Teddrick was seen today for follow-up, depression, anxiety and manic behavior.  Diagnoses and all orders for this visit:  Bipolar II disorder ([place])  GAD (generalized anxiety disorder) -     fluvoxaMINE (LUVOX) 100 MG tablet; Take 2 tablets (200 mg total) by mouth daily.  Mixed obsessional thoughts and acts -     fluvoxaMINE (LUVOX) 100 MG tablet; Take 2 tablets (200 mg total) by mouth daily.  OSA (obstructive sleep apnea)  Lithium use  Alcohol dependence in remission (HCC)  Low vitamin D level  Low serum vitamin B12  Major depressive disorder with single episode, in partial remission (HCC) -     lithium carbonate 300 MG capsule; Take 4 capsules (1,200 mg total) by mouth at bedtime. -     fluvoxaMINE (LUVOX) 100 MG tablet; Take 2 tablets (200 mg total) by mouth daily.   Chronic recurrent rumination with anxiety and urgency and sense of need and instablity with impatience but not quite desperate were better but anhedonic depression and anxious.  But not as painful as it was.   Chronic worry and overwhelmed  easily and misattributes sx as SE of meds leading to  inadequate med trials.  Fear of meds.  Anxiety was controlled by fluvoxamine 200 . No change  Consider [place]   he describe some other manic cycling symptoms including increased interest, increased goal-directed behaviors, increased urge to spend that will last for 6 weeks or so and then stop.  He has these cycles about 3 times a year.  This is suggestive of a bipolar predisposition which was confirmed by recent cycle into mania from clomipramine.  Continue CPAP use. New CPAP machine.  Falsely attributes his psych sx to the meds and then stops meds AMA.  False attribution of sx as SE.  Discussed potential metabolic side effects associated with atypical antipsychotics, as well as potential risk for movement side effects. Advised pt to contact office if movement side effects occur.  He agrees.  Call if you have any problems with this.  He remains sober.  He wants to consider counseling to work through some personal issues as well as address specific symptoms like his anger and irritability. Rec local AA instead of online. Disc no BZ bc history alcoholism and high risk tolerance.  Bc recent mania with clomipramine increase lithium 1200  mg daily .    He admits to feeling worse when he stopped the lithium.  Call if sx get worse. Counseled patient regarding potential benefits, risks, and side effects of lithium to include potential risk of lithium affecting thyroid and renal function.  Discussed need for periodic lab monitoring to determine drug level and to assess for potential adverse effects.  Counseled patient regarding signs and symptoms of lithium toxicity and advised that they notify office immediately or seek urgent medical attention if experiencing these signs and symptoms.  Patient advised to contact office with any questions or concerns. 02/02/21  02/02/21 lithium level 0.6 on 900 mg daily, normal B12 and vitamin D 52  Disc the off-label use of N-Acetylcysteine at 600 mg daily to help  with mild cognitive problems.  It can be combined with a B-complex vitamin as the B-12 and folate have been shown to sometimes enhance the effect.  Partial benefit so continue NAC 1200 mg daily.  Reads on internet about meds.  Disc this. Needs a lot of reassurance.  Constipation management 1.  Lots of water 2.  Powdered fiber supplement such as MiraLAX, Citrucel, etc. preferably with a meal 3.  2 stool softeners a day 4.  Milk of magnesia or magnesium tablets if needed  Continue vitamin D DT history level 30  FU 8 weeks  Lynder Parents, MD, DFAPA   Please see After Visit Summary for patient specific instructions.  Future Appointments  Date Time Provider [place]  03/29/2022  3:30 PM Cottle, Billey Co., MD [facility] None  07/26/2022  2:45 PM [facility]-NURSE HEALTH ADVISOR [facility]    No orders of the defined types were placed in this encounter.       -------------------------------

## 2022-03-29 ENCOUNTER — Ambulatory Visit (INDEPENDENT_AMBULATORY_CARE_PROVIDER_SITE_OTHER): Payer: Medicare Other | Admitting: Psychiatry

## 2022-03-29 ENCOUNTER — Encounter: Payer: Self-pay | Admitting: Psychiatry

## 2022-03-29 DIAGNOSIS — F411 Generalized anxiety disorder: Secondary | ICD-10-CM

## 2022-03-29 DIAGNOSIS — Z79899 Other long term (current) drug therapy: Secondary | ICD-10-CM

## 2022-03-29 DIAGNOSIS — F3181 Bipolar II disorder: Secondary | ICD-10-CM

## 2022-03-29 DIAGNOSIS — F422 Mixed obsessional thoughts and acts: Secondary | ICD-10-CM

## 2022-03-29 DIAGNOSIS — G4733 Obstructive sleep apnea (adult) (pediatric): Secondary | ICD-10-CM | POA: Diagnosis not present

## 2022-03-29 DIAGNOSIS — F1021 Alcohol dependence, in remission: Secondary | ICD-10-CM

## 2022-03-29 MED ORDER — RISPERIDONE 1 MG PO TABS: 1.0000 mg | ORAL_TABLET | Freq: Every day | ORAL | 1 refills | Status: DC

## 2022-03-29 NOTE — Progress Notes (Signed)
Jonathon Walker [id number] Aug 07, 1965 57 y.o.  Subjective:   Patient ID:  Jonathon Walker is a 57 y.o. (DOB Nov 08, 1965) male.  Chief Complaint:  Chief Complaint  Patient presents with   Follow-up    Bipolar II disorder ([place])   Anxiety   Depression    Depression        Associated symptoms include fatigue.  Associated symptoms include no decreased concentration and no suicidal ideas.  Past medical history includes anxiety.   Anxiety Symptoms include nervous/anxious behavior. Patient reports no chest pain, confusion, decreased concentration, nausea or suicidal ideas.       Jonathon Walker presents to the office today for follow-up of anxiety , depression, alcohol.    seen August 7.  His anxiety was unmanaged at Paxil 40 mg and olanzapine 5 mg so olanzapine was increased to 7.5 daily.  seen December 17, 2018.  His anxiety was unmanaged.  The following change was made: Trial 1-1/2 of the 7.5 mg tablets of olanzapine for anxiety.  He is had a stay at [facility] for alcohol dependence since he was last here. Stayed 28 days and DC before Thanksgiving.  Very helpful.  Was having NM nighly before and they stopped since then.  Not drinking.  Involved in AA since then.  seen March 25, 2019.  The patient was doing better requested a reduction in olanzapine to 7.5 mg nightly to improve energy.  visit April 03, 2019.  He had remained sober and his anxiety was improved with sobriety.  He was having problems with low motivation and forgetfulness.  At the next refill it was decided olanzapine would be reduced from 7.5 to 5 mg daily. He called requesting this urgent appointment because of worsening symptoms associated with returning to work.  seen May 21, 2019.  He was doing relatively well.  The following was noted: Tolerating Wellbutrin and it seems to help. He didn't tolerate the increase. Anxiety is improved off alcohol.  Ok to reduce olanzapine to 2.5 mg daily for a month and stop  it.  Hopefully low motivation will improve.   He called back March 23 stating he was more depressed and having a harder time doing routine tasks and having suicidal thoughts.  Because of worsening depression after reducing the olanzapine he was encouraged to increase olanzapine back to 5 mg nightly. He called again March 24 stating he was really struggling but was scheduled for an appointment on March 26.  As of June 14, 2019 he reports the following: Every day a real hard struggle to get through the days.  More anxious and depressed equally. Worse 2 weeks.  Last Saturday so overwhelmed by how he feels he had SI.  Everything is hard. Initial benefit paroxetine has been lost.  Awakens stressed and tired. Adequate hours of sleep.  Will be major chore just to mow grass.  Still sober. SE sexual. Getting appt with CPAP doc. Changes made include: Increasing olanzapine back to 5 mg daily a couple of days prior to the appointment due to phone call and the addition of lithium 300 mg for a few days then increase to 600 mg nightly both to augment the antidepressant and because of suicidal thoughts.  July 15, 2019 appointment, the following is noted: Doing OK with both depression and anxiety.  Kind of like a top always going in mind.  Even when I sleep, when awakens mind still going.  NM nightly for years but less than before stopped drinking.  Smaller and less severe ones that don't wake him.  Theme can't finish things.  Both depression and anxiety 3/10.  More productive.  Paces himself.  Can live with it.  Sleep 9 hours.  Initially olanzapine stopped the ruminating but it's back some.  Overall still benefit.  No SI and can't rmember why he had them before. Residual energy not great and some ruminating. He had some concerns about sexual side effects from Paxil but agreed that no med changes were necessary despite residual symptoms of depression and anxiety.  08/14/2019 appointment, the following is noted:  He  scheduled urgently. Vaccinated. Angry easily and exhausted and everything is a chore.  Exhausted. Never had appt with CPAP company.  CPAP is 58 years old and on same settings as in the past.  CPAP machine is not working properly but when it did it was helpful for alertness and energy.  Disc need to call them to have it evaluated. To bed 10-7.  Don't feel rested in morning but used to feel rested when first started paroxetine. Plan: Increase Lithium continue to 900 mg daily for irritability  11/19/19 appt with the following noted: He increased to 900 mg and then reduced it but doesn't know why.  Reduced it to 300 nightly. Irritability much better.  Once anger since here with rage but not outward. Still on paroxetine 40, olanzapine 5, Wellbutrin 75 AM. Getting tired in afternoon and worrying about getting depressed but he's not depressed now.  Don't enjoy things like he used to.  Les interest and excitement. Appetite and sleep are normal.   Still adjusting without alcohol and socialization. Worrying about lack of sex drive with paroxetine. Can still ruminate on things until he gets some reassurance through talking with people. Plan: Continue low-dose lithium 300 mg daily Continue Paxil.  Did not feel well at 60 mg so we will continue 40 mg.  Likely to lose anxiety benefit if change it. Continue low-dose Wellbutrin 75  Tolerating Wellbutrin and it seems to help. He didn't tolerate the increase.   Continue olanzapine 5 mg  02/18/2020 appointment with following noted: Open to trying increase paroxetine again bc ruminating wears him out and gets fatigued and it wears him out though is 80% better vs before paroxetine. Doesn't remember trying higher paroxetine.  Ruminates on relationships or work.  Whenever he can talk about it with someone it helps tremendously but also realizes paroxetine helps. Anxiety affects dreams and NM too.  Up and down a little.  Anxiety drove depression before paroxetine.   Best med he's tried. SOBER 1 YEAR SE no problem.  Except gained 25# in 4 years.  No change in diet and exercise.  No increase in appetite and no food cravings.  Seeing PCP soon. Plan: Increase paroxetine trial to 60 for rumination.  Disc SE.  05/26/20 appt noted: No benefit increase paroxetine nor SE.  Wants to reduce back to 30 mg paroxetine and stop lithium bc it didn't seem to help.   4 days of Calm app has seemed to help rumination.  Does it in afternoon and before bed and it seemed to helps.  Doing a 10 min meditation and it helps.   Dep 4/10.  Anxiety 4/10. And a lot better the last few days.   Plan: Reduce lithium by 1 tablet per week.  Call if there is any suicidal thought. Starting April 1 reduce paroxetine to 40 mg daily. Continue low-dose Wellbutrin 75  Tolerating Wellbutrin and it seems to  help. He didn't tolerate the increase.   If doing OK will try taper as he suggested at next visit. Continue olanzapine 5 mg nightly it was helpful for depression and anxiety.  07/01/2020 appt with following noted: Moved appt up. Now says he understated depression level when he was here the last time. Restarted lithium bc felt more depressed without it. Reduced paroxetine to 40 mg daily. Lethargic.  Mind won't calm down.  Nonrestorative sleep but 8 hours.  Doesn't think sleep is deep enough.  Tense. Goes to [employer]. Started counseling. Wants to try something different with meds. Can have mixed sx for 6 weeks with increased interest in things and motivation and want to spend money but still feels depressed.  3 times in the last year. Plan: Increase  lithium back to 3 daily to see if depression is better and less mood cycling.  He admits to feeling worse when he stopped the lithium Continue the reduce paroxetine to 45 mg daily. DC Wellbutrin Lamotrigine trial 25 mg to 100 mg daily over 4 weeks. Continue olanzapine 5 mg nightly it was helpful for depression and anxiety.  08/26/2020  appointment with the following noted: Sad and down.  Classic depression sx and very fatigued.  Unusual to have classic depression bc usually is atypical.  No effect from lamotrigine. Wonders if atorvastatin is causing some confusion or depression bc read about it. Depressed for 3 mos. Gained 20#.  It's leveled off now. Olanzapine helped the rumination and anxiety.   Life is more depressing bc not seeing friends like it was bc stopped drinking.   No SE with lithium.  Unless dropping things. Sleep 1030 to 6 but doesn't sleep deep.  Not rested in AM Plan: Increase  lithium back to 3 daily to see if depression is better and less mood cycling.  He admits to feeling worse when he stopped the lithium Continue the reduce paroxetine to 40 mg daily bc it helped anxiety. Lamotrigine increase to 150 mg daily DT no benefit or SE Increase olanzapine10 mg nightly it was helpful for depression and anxiety.  Need more help with depression   09/11/2020 phone call: Pt stated he is foegetting things throughout the day and very unsteady.He works with tools and equptment and this is a problem.He stated he takes 6 Lamictal in the am. MD response: We made 3 med changes at the last visit including increasing lamotrigine to 150 mg daily, increasing olanzapine to 10 mg daily, and restarting lithium.  Any 1 of those changes could possibly cause some of the side effects.  We will have to make 1 change at a time to evaluate this.  Therefore reduce lamotrigine to 100 mg daily.  It may take a couple of weeks before he sees a difference.  11/13/2020 phone call: Patient lm stating he is currently taking Lamotrigine 150 mg. He has decreased it to 75 mg due to side effects per pt ( blurred vision, shaking and coordination issues). Patient stated he is discontinuing before his scheduled October appointment. MD response: Take the lamotrigine 75 mg for 2 weeks and he should be able to stop then without withdrawal.  If he gets shakey,  nervous then we need to reduce more slowly and let us know that.  Otherwise he can stop if he follows these directions.  11/30/2020 phone call: Pt called and advised he would like to taper off of the Olanzapine.  He has gained 8# since his last visit and 30# total since  he started taking the med.  He feels steady, not so much stress now, and would like to see if he can take Lorazepam instead.  Then talk to Dr. Clovis Pu in Oct when they meet. MD response: Reduce olanzapine to one half of the 10 mg tablet at night for 4 weeks and then stop it.  If he gets more depressed then get back in touch with [place] and we will try to do something about the weight gain that olanzapine can cause by using the alternative Lybalvi  12/07/2020 appointment with the following noted: Off lamotrigine .  Reduced olanzapine to 5 mg for a week.  On lithium 900 mg HS, paroxetine 40 Fatigue 10/10.  Dizziness resolved off lamotrigine.  Low fatigue and motivation.  "I want to want to do things".  Stopped drinking almost 18 mos and socially big adjustment.   Still invloved with AA and daily sponsor.  Working [employer].  [employer] helped. Thinks mild depression with little interest and motivation and no joy.  But not severe.  No SI. No anxiety and it's way better.  Life is more stable and helps.  Working 4 hours daily and occ extrea jobs. Uses new CPAP.  Stil drowsy and fatigue. Plan: Continue CPAP use. New CPAP machine. Trial modafinil 100-200 mg AM Lamotrigine off DT NR Reduced olanzapine to 5 mg daily for a week and well stop in 3 weeks DT wt gain and fatigue  01/20/2021 phone call: I called patient to see if I could get better information. He states he started modafinil a couple of weeks ago, though Rx was written for in September. He states he didn't feel well before taking the modafinil though. He just says he is agitated, irritable, and unstable feeling. When asked what he meant by unstable he stated feeling a "shell of myself".  He  has been put on the cancellation list.  MD response: It is likely the modafinil that is causing this problem.  But it may be corrected with a dose reduction.  I assume he is taking a whole tablet.  If that is the case time to reduce to 1/2 tablet in the morning.  If he is taking half a tablet tell him to reduce it to a quarter of a tablet.  And I agree we need to try to get him in as soon as possible. He reduced modafinil from 200 mg to 100 mg daily.  02/10/2021 appointment with the following noted: Current psych meds lithium 900 mg nightly, paroxetine 40 mg daily, olanzapine recently stopped, modafinil recently tried. Was really bad for awhile feeling depresssed and agitated and anxiety but better now including after reducing modafinil to 100 mg daily. Fatigue is better and in the middle now with energy, not good but not bad. Sleep is better.  CPAP doctor yesterday and they are gathering info pending. NAC is helping with memory. Still sober and committed to it. SE constipation  Plan: No med changes  03/10/2021 phone call from patient complaining of vague feelings of not feeling well and wanting the Paxil changed.  He was placed on the cancellation list.  03/18/2021 appointment urgently scheduled at his request: Remains on paroxetine 40 mg daily, lithium 1200 mg daily, modafinil 200 mg every morning. "Ruminating"  with sense of need to talk to someone.  Then feels better for awhile.  Mostly on relationships with family.  No one to talk to. Wonders need to chang ethe meds.   Don't socialize anymore since sober.  Does online AA everyday at noon. Lost 10# off olanzapine. Plan: Continue paroxetine 40 mg, lithium 1200 mg daily, Try stopping modafinil '100mg'$  AM to determine if it is helping or not. If not less anxious then start risperidone for rumination 1-2 mg HS  04/05/2021 appointment with the following noted: Stopped  modafinil and it helped reduce anxiety. Started risperidone 2 instead of  1 mg and was off balance so reduced risperidone to 1 mg HS. Only done this for 3 nights.  No SE with it. Anxiety is not close to as bad as originally.  Can be painful but not now. Still ruminating some "my whole adult life" but varies.  Anxiety can lead to depression. 6-7/10 with 10 good on depression with some problems with ambition. Plan: Continue trial risperidone for rumination 1 mg HS  04/07/2021 TC: Several phone calls: Called 2 days after the visit complaining of itching from the lithium and wanting to come off of it.  He was warned he was more emotionally distressed when he reduced the lithium in the past but he wanted to pursue it anyway.  He was continuing risperidone 1 mg nightly but wanting to reduce that also but was warned he would almost certainly have worsening mood severity and anxiety problems based on past experience.  He was cautioned against reducing lithium below 900 mg daily and risperidone below 1 mg nightly.  He agreed on 04/09/2021.  04/09/2021 he called again and several phone calls ensued thereafter.  He insisted on stopping lithium and risperidone despite warnings that his mood stability and anxiety would worsen. He was prescribed Depakote to take the place  04/20/2021 appointment with the following noted: He stopped risperidone and did not take the Depakote.  He is taking lithium 900 mg nightly and paroxetine 40 mg daily. Stopped risperidone bc feeling agitated and unstable and blamed it.  Still feels the same off of it. Says itching 85% better with less lithium.  Itching for a couple of mos.  Doesn't think anxiety noticeably worse after reducing lithium to 900 mg daily. Not dizzy.  Clumsy with hands. Anxious but not painful like it was before Paxil but tense at the end of the day.  Initially felt normal for the first time in 20 years when first started paxil but not as good now. Not sadness and hasn't had much of it. Started therapy with doctor on demand. Ruminating on  "fear of not being fully present" and worries about little things like appts pending.   Afraid of working FT because gets overwhelmed bc "I take it home with me" Plan : Continue trial risperidone but increase to rumination 1.5 mg HS Continue paroxetine 40 and lithium 600 mg   06/01/2021 appointment with the following noted: Haven't seen benefit with risperidone 1.5 mg daily. Not in pain but feels disconnected and not myself.  Has felt like this off and on for years. Level of angst all day but not real high unless triggered.  Still has the rumination. Initially a lot of benefit from paroxetine Not markedly deprssed and not sad.  07/14/21 appt noted:  Doesn't feel much different with switch to fluvoxamine except anxiety well controlled. But still low energy, motivation and not as productive as he wants to be.  Not markedly sad.  Asks about Vraylar. Not much interest or enjoyment. Don't sleep that great and don't relax that great. On fluvoxamine 200, lithium 900, off risperidone. Plan: Anxiety controlled by fluvoxamine 200 Potentiate Vraylar 1.5 mg daily  07/27/2021  phone call complaining of ongoing depression.  Vraylar increased to 3 mg daily. 07/29/2021 phone call complaining of cost of Vraylar.  Was given samples. 08/20/2021 phone call complaining of fatigue.  Stated he stopped the Vraylar because it made him agitated.  It was recommended there be no further med changes because he is only been off the Vraylar for 1 week.  08/30/2021 appointment noted: Vraylar 1.5 mg didn't do much.  Increased to 3 mg and then felt irritable ans stopped it about 3rd week of May.  Better now. Still extremely tired and feels physically sick with normal workup. Thinks his mind wears him out.  Racing negative thoughts all the time. Asks about Ativan. Always nervous and anxious.  Trouble discerning anxiety from depression. Plan: Anxiety was controlled by fluvoxamine 200 but now he thinks it is worse. So increase 250  mg daily  10/18/21 appt noted: No results with increase fluvoxamine.  No SE. Anxiety is still worse than depression.  Thinks it causes somatic sx like the flu.  Exhausted. GI. Poor sleep and concentration. Asks about lorazepam.   Got checked out by PCP without findings. Plan: Reduce lithium to 2 capsules daily to see if physical symptoms like fatigue etc. are better Increase fluvoxamine to 1 tablet in the morning and 2 tablets in the evening to reduce anxiety Plan Trial reduction of  lithium 900  to 600 mg daily to see if somatic sx are better like fatigue.   01/06/22 appt noted" Everything is about the same with med changes. CC anxiety and fatigue.  Sx started with severe depression in 20s and got better but never resolved. Thinks anxiety is causing fatigue.  Has to nap about 1 pm.  Easily fatigued.  No particular worry other than how he feels and himself.. Plan: Reduce fluvoxamine gradually to 150 mg daily Start clomipramine 25 mg nightly for 5 days and increase to 75 mg HS  02/07/22 TC:  Pt lvm that the clomiprame 25 mg has been taking the full dose for two weeks now. He is experiencing mania. Overspending etc    MD resp:  Increase the lithium back to 3 daily.  Stop the clomipramine.  Resume the risperidone until these symptoms clear up.  Call us back next week and let us know how it is going.     02/16/22 TC:   Pt was just calling to give you an update.He stated he is still 50 percent manic but feels better.He is currently taking   lithium carbonate 300 MG 3 daily risperiDONE 3 mg 1/2 tab daily fluvoxamine 100 MG tablet 1/2 tablet in the AM and 1 tablet at night On 11/20 changes were made,you had him stop clomipramine                     and increase lithium to 900 mg daily.  02/16/22 MD: check lithium level.  02/24/22 tC complaining of depression. MD :  He has had recent mood swings from manic to depressed by his report.  Lithium level is low at 0.5 on 600 mg daily.  I think  the most helpful thing for his mood would be to increase lithium back to 900 mg daily.      03/07/22 TC: pt reported increasing his Luvox to 200 mg daily.  03/08/22 appt noted: Multiple phone calls since ehre.   Clomipramine triggered mania like he's never had before.  Increased over the top looking for a house, wanting to invent things, bought a  shed and fish tank abruptly.  Left his job which was a good thing.  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  I feel a lot better about it.  Has disability and will do PT work.  I'll be OK. Exhausted and can't be on his feet all the time.   Sleep is different with EMA and ready to go but watches TV awhile and then goes back to sleep.  Always a late sleeper all his life.   Still feeling some depression and he increased the luvox to 200 and it helped. Thinks parents are Autistic and don't respond emotionally.  M cannot understand feelings and he doesn't get any support.   Plan: Bc recent mania with clomipramine increase lithium 1200  mg daily .    03/29/2022 appointment urgently due to recent mania: Psych meds: fluvoxamine 200, lithium 1200, risperidone 1.5 mg HS Exhausted until the last couple of days. SE tremor Has not had any water today and is lightheaded.   Mood a little better and anxiety better than it was.  No mania. Some underlying weight in mood that only responded to paroxetine and clomipramine. Asks about Wellbutrin for energy. Sleep normal 8-9 hours now.  Except last night. Goes to [facility] groups and support group and AA. Sober  Seeing Jonathon Moore PhD  On disability for 7-8 years.  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  Sober 2 year 01/2019  Past psych meds:  Paxil 60 "too much",  duloxetine, Wellbutrin 75 (max tolerated),  Zoloft, Viibryd 60 NR,  Fluvoxamine 300 no better than 200 Clomipramine mania  Abilify, Rexulti. Vraylar irritable at 3 mg daily.  Buspar stopped DT little effect. Xanax,  Olanzapine 10  fatigue Risperdal 2 once  lithium  900, worse when he stopped lithium Lamotrigine NR CO dizzy  NAC 600 helped Modafinil 200 agitated Took lorazepam  in the past, Xanax less helpful  Clomipramine mania  Review of Systems:  Review of Systems  Constitutional:  Positive for fatigue. Negative for unexpected weight change.  Cardiovascular:  Negative for chest pain.  Gastrointestinal:  Positive for constipation. Negative for nausea.  Genitourinary:  Negative for urgency.  Neurological:  Negative for tremors.  Psychiatric/Behavioral:  Positive for dysphoric mood. Negative for agitation, behavioral problems, confusion, decreased concentration, hallucinations, self-injury, sleep disturbance and suicidal ideas. The patient is nervous/anxious. The patient is not hyperactive.     Medications: I have reviewed the patient's current medications.  Current Outpatient Medications  Medication Sig Dispense Refill   amLODipine (NORVASC) 5 MG tablet TAKE 1 TABLET BY MOUTH EVERY DAY 90 tablet 3   atorvastatin (LIPITOR) 40 MG tablet TAKE 1 TABLET(40 MG) BY MOUTH DAILY 90 tablet 1   fluvoxaMINE (LUVOX) 100 MG tablet Take 2 tablets (200 mg total) by mouth daily. 180 tablet 0   lisinopril (ZESTRIL) 20 MG tablet TAKE 1 TABLET(20 MG) BY MOUTH DAILY 90 tablet 3   lithium carbonate 300 MG capsule Take 4 capsules (1,200 mg total) by mouth at bedtime. 360 capsule 0   risperiDONE (RISPERDAL) 1 MG tablet Take 1 tablet (1 mg total) by mouth at bedtime. 30 tablet 1   Vitamin D, Ergocalciferol, (DRISDOL) 1.25 MG (50000 UNIT) CAPS capsule Take 1 capsule (50,000 Units total) by mouth every 7 (seven) days. 5 capsule 5   No current facility-administered medications for this visit.    Medication Side Effects: sexual, weight  Allergies: No Known Allergies  Past Medical History:  Diagnosis Date  Anxiety    Bimalleolar fracture of left ankle    Depression    Heart murmur    Sleep apnea    uses CPAP nightly     Family History  Problem Relation Age of Onset   Cancer Maternal Grandfather    Colon cancer Neg Hx    Colon polyps Neg Hx    Esophageal cancer Neg Hx    Rectal cancer Neg Hx    Stomach cancer Neg Hx     Social History   Socioeconomic History   Marital status: Single    Spouse name: Not on file   Number of children: Not on file   Years of education: Not on file   Highest education level: Not on file  Occupational History   Not on file  Tobacco Use   Smoking status: Every Day    Packs/day: 1.50    Years: 0.00    Total pack years: 0.00    Types: Cigarettes    Start date: 03/22/1975   Smokeless tobacco: Never   Tobacco comments:    He reports he has quit 3 times and recently started back in 2012. Hsm   Vaping Use   Vaping Use: Never used  Substance and Sexual Activity   Alcohol use: Not Currently   Drug use: No   Sexual activity: Not on file  Other Topics Concern   Not on file  Social History Narrative   Not on file   Social Determinants of Health   Financial Resource Strain: Low Risk  (07/29/2021)   Overall Financial Resource Strain (CARDIA)    Difficulty of Paying Living Expenses: Not hard at all  Food Insecurity: No Food Insecurity (07/29/2021)   Hunger Vital Sign    Worried About Running Out of Food in the Last Year: Never true    Ran Out of Food in the Last Year: Never true  Transportation Needs: No Transportation Needs (07/29/2021)   PRAPARE - Hydrologist (Medical): No    Lack of Transportation (Non-Medical): No  Physical Activity: Inactive (07/29/2021)   Exercise Vital Sign    Days of Exercise per Week: 0 days    Minutes of Exercise per Session: 0 min  Stress: Stress Concern Present (07/29/2021)   [place]    Feeling of Stress : Rather much  Social Connections: Moderately Isolated (07/29/2021)   Social Connection and Isolation Panel [NHANES]    Frequency of  Communication with Friends and Family: More than three times a week    Frequency of Social Gatherings with Friends and Family: More than three times a week    Attends Religious Services: Never    Marine scientist or Organizations: Yes    Attends Music therapist: More than 4 times per year    Marital Status: Never married  Intimate Partner Violence: Not At Risk (07/29/2021)   Humiliation, Afraid, Rape, and Kick questionnaire    Fear of Current or Ex-Partner: No    Emotionally Abused: No    Physically Abused: No    Sexually Abused: No    Past Medical History, Surgical history, Social history, and Family history were reviewed and updated as appropriate.   Please see review of systems for further details on the patient's review from today.   Objective:   Physical Exam:  There were no vitals taken for this visit.  Physical Exam Constitutional:      General: He is  not in acute distress.    Appearance: Normal appearance. He is well-developed.     Comments: Red face  Musculoskeletal:        General: No deformity.  Neurological:     Mental Status: He is alert and oriented to person, place, and time.     Motor: No tremor.     Coordination: Coordination normal.     Gait: Gait normal.  Psychiatric:        Attention and Perception: Attention normal. He is attentive.        Mood and Affect: Mood is anxious and depressed. Affect is not labile, blunt, angry or inappropriate.        Speech: Speech normal. Speech is not slurred.        Behavior: Behavior normal. Behavior is not agitated or slowed.        Thought Content: Thought content normal. Thought content is not delusional. Thought content does not include homicidal or suicidal ideation. Thought content does not include suicidal plan.        Cognition and Memory: Cognition normal.        Judgment: Judgment normal.     Comments: Insight  Fair. Less anxiety and deprerssion with more lithium      Lab Review:      Component Value Date/Time   NA 141 03/01/2022 0904   K 4.0 03/01/2022 0904   CL 107 03/01/2022 0904   CO2 27 03/01/2022 0904   GLUCOSE 81 03/01/2022 0904   BUN 18 03/01/2022 0904   CREATININE 1.01 03/01/2022 0904   CALCIUM 9.6 03/01/2022 0904   PROT 7.6 06/17/2021 1350   ALBUMIN 4.7 06/17/2021 1350   AST 19 06/17/2021 1350   ALT 23 06/17/2021 1350   ALKPHOS 108 06/17/2021 1350   BILITOT 0.5 06/17/2021 1350       Component Value Date/Time   WBC 8.3 06/17/2021 1350   RBC 5.12 06/17/2021 1350   HGB 15.7 06/17/2021 1350   HGB 15.2 05/05/2010 1049   HCT 46.4 06/17/2021 1350   HCT 44.8 05/05/2010 1049   PLT 229.0 06/17/2021 1350   PLT 193 05/05/2010 1049   MCV 90.7 06/17/2021 1350   MCV 89.6 05/05/2010 1049   MCH 30.4 05/05/2010 1049   MCHC 33.9 06/17/2021 1350   RDW 13.2 06/17/2021 1350   RDW 13.1 05/05/2010 1049   LYMPHSABS 2.6 06/17/2021 1350   LYMPHSABS 1.8 05/05/2010 1049   MONOABS 0.6 06/17/2021 1350   MONOABS 0.4 05/05/2010 1049   EOSABS 0.2 06/17/2021 1350   EOSABS 0.2 05/05/2010 1049   BASOSABS 0.1 06/17/2021 1350   BASOSABS 0.0 05/05/2010 1049    Lithium Lvl  Date Value Ref Range Status  03/01/2022 0.5 (L) 0.6 - 1.2 mmol/L Final  03/01/22 lithium 0.5 on 9-00 mg daily   02/02/21 lithium level 0.6 on 900 mg daily  No results found for: "PHENYTOIN", "PHENOBARB", "VALPROATE", "CBMZ"   .res Assessment: Plan:     Fabrizio was seen today for follow-up, anxiety and depression.  Diagnoses and all orders for this visit:  Bipolar II disorder ([place]) -     Lithium level -     risperiDONE (RISPERDAL) 1 MG tablet; Take 1 tablet (1 mg total) by mouth at bedtime.  GAD (generalized anxiety disorder) -     risperiDONE (RISPERDAL) 1 MG tablet; Take 1 tablet (1 mg total) by mouth at bedtime.  Mixed obsessional thoughts and acts  OSA (obstructive sleep apnea)  Lithium use  Alcohol dependence in remission (  [place])   Chronic recurrent rumination with anxiety and  urgency and sense of need and instablity with impatience but not quite desperate were better but anhedonic depression and anxious.  But not as painful as it was.   Chronic worry and overwhelmed easily and misattributes sx as SE of meds leading to inadequate med trials.  Fear of meds.  Anxiety was controlled by fluvoxamine 200 . No change   he describe some other manic cycling symptoms including increased interest, increased goal-directed behaviors, increased urge to spend that will last for 6 weeks or so and then stop.  He has these cycles about 3 times a year.  This is suggestive of a bipolar predisposition which was confirmed by recent cycle into mania from clomipramine.  Continue CPAP use. New CPAP machine.  Falsely attributes his psych sx to the meds and then stops meds AMA.  False attribution of sx as SE.  Discussed potential metabolic side effects associated with atypical antipsychotics, as well as potential risk for movement side effects. Advised pt to contact office if movement side effects occur.  He agrees.  Call if you have any problems with this.  He remains sober.  He wants to consider counseling to work through some personal issues as well as address specific symptoms like his anger and irritability. Rec local AA instead of online. Disc no BZ bc history alcoholism and high risk tolerance.  Bc recent mania with clomipramine increased lithium 1200  mg daily .    He admits to feeling worse when he stopped the lithium.  Call if sx get worse. Counseled patient regarding potential benefits, risks, and side effects of lithium to include potential risk of lithium affecting thyroid and renal function.  Discussed need for periodic lab monitoring to determine drug level and to assess for potential adverse effects.  Counseled patient regarding signs and symptoms of lithium toxicity and advised that they notify office immediately or seek urgent medical attention if experiencing these signs and  symptoms.  Patient advised to contact office with any questions or concerns. 02/02/21  02/02/21 lithium level 0.6 on 900 mg daily, normal B12 and vitamin D 52 Check lithium level.   Disc the off-label use of N-Acetylcysteine at 600 mg daily to help with mild cognitive problems.  It can be combined with a B-complex vitamin as the B-12 and folate have been shown to sometimes enhance the effect.  Partial benefit so continue NAC 1200 mg daily.  To help energy reduce risperidone from 1.5 to 1 mg HS  Reads on internet about meds.  Disc this. Needs a lot of reassurance.  Constipation management 1.  Lots of water 2.  Powdered fiber supplement such as MiraLAX, Citrucel, etc. preferably with a meal 3.  2 stool softeners a day 4.  Milk of magnesia or magnesium tablets if needed  Continue vitamin D DT history level 30  FU 8 weeks  Lynder Parents, MD, DFAPA   Please see After Visit Summary for patient specific instructions.  Future Appointments  Date Time Provider [place]  07/26/2022  2:45 PM [facility]NURSE HEALTH ADVISOR [facility]    Orders Placed This Encounter  Procedures   Lithium level        -------------------------------

## 2022-04-02 LAB — LITHIUM LEVEL: Lithium Lvl: 0.9 mmol/L (ref 0.6–1.2)

## 2022-04-03 NOTE — Progress Notes (Signed)
Lithium level better 0.9 on 1200 mg daily after increase for mania.  No change indicated

## 2022-04-04 ENCOUNTER — Ambulatory Visit: Payer: Medicare Other | Admitting: Psychiatry

## 2022-05-11 ENCOUNTER — Ambulatory Visit: Payer: Medicare Other | Admitting: Psychiatry

## 2022-05-11 ENCOUNTER — Telehealth: Payer: Self-pay | Admitting: Psychiatry

## 2022-05-11 DIAGNOSIS — Z91199 Patient's noncompliance with other medical treatment and regimen due to unspecified reason: Secondary | ICD-10-CM

## 2022-05-11 NOTE — Progress Notes (Signed)
Pt left before being seen .  DT wt 30+ mins

## 2022-05-11 NOTE — Telephone Encounter (Signed)
Pt lvm wanting dr. Clovis Pu to know that he is still manic., He is in the bed all the time only gets up for an hour or two in the am and afternoon. Then he goes back to bed. His next  appt is march 6th.He said he was struggling. Please call him at [phone]

## 2022-05-25 ENCOUNTER — Ambulatory Visit (INDEPENDENT_AMBULATORY_CARE_PROVIDER_SITE_OTHER): Payer: Medicare Other | Admitting: Psychiatry

## 2022-05-25 ENCOUNTER — Encounter: Payer: Self-pay | Admitting: Psychiatry

## 2022-05-25 DIAGNOSIS — F3181 Bipolar II disorder: Secondary | ICD-10-CM | POA: Diagnosis not present

## 2022-05-25 DIAGNOSIS — E538 Deficiency of other specified B group vitamins: Secondary | ICD-10-CM

## 2022-05-25 DIAGNOSIS — G4733 Obstructive sleep apnea (adult) (pediatric): Secondary | ICD-10-CM

## 2022-05-25 DIAGNOSIS — F411 Generalized anxiety disorder: Secondary | ICD-10-CM | POA: Diagnosis not present

## 2022-05-25 DIAGNOSIS — R7989 Other specified abnormal findings of blood chemistry: Secondary | ICD-10-CM

## 2022-05-25 DIAGNOSIS — F422 Mixed obsessional thoughts and acts: Secondary | ICD-10-CM

## 2022-05-25 DIAGNOSIS — F1021 Alcohol dependence, in remission: Secondary | ICD-10-CM

## 2022-05-25 DIAGNOSIS — Z79899 Other long term (current) drug therapy: Secondary | ICD-10-CM

## 2022-05-25 MED ORDER — LURASIDONE HCL 40 MG PO TABS: ORAL_TABLET | ORAL | 1 refills | Status: DC

## 2022-05-25 NOTE — Progress Notes (Signed)
Jonathon Walker [id number] 06/20/1965 57 y.o.  Virtual Visit via Telephone Note  I connected with pt by telephone and verified that I am speaking with the correct person using two identifiers.   I discussed the limitations, risks, security and privacy concerns of performing an evaluation and management service by telephone and the availability of in person appointments. I also discussed with the patient that there may be a patient responsible charge related to this service. The patient expressed understanding and agreed to proceed.  I discussed the assessment and treatment plan with the patient. The patient was provided an opportunity to ask questions and all were answered. The patient agreed with the plan and demonstrated an understanding of the instructions.   The patient was advised to call back or seek an in-person evaluation if the symptoms worsen or if the condition fails to improve as anticipated.  I provided 30 minutes of non-face-to-face time during this encounter. The call started at 1130 and ended at 1200. The patient was located at home and the provider was located office. Sick with virus  Subjective:   Patient ID:  Jonathon Walker is a 57 y.o. (DOB November 17, 1965) male.  Chief Complaint:  Chief Complaint  Patient presents with   Follow-up   Depression   Anxiety    Depression        Associated symptoms include fatigue.  Associated symptoms include no decreased concentration and no suicidal ideas.  Past medical history includes anxiety.   Anxiety Symptoms include nervous/anxious behavior. Patient reports no chest pain, confusion, decreased concentration, nausea or suicidal ideas.       Jonathon Walker presents to the office today for follow-up of anxiety , depression, alcohol.    seen August 7.  His anxiety was unmanaged at Paxil 40 mg and olanzapine 5 mg so olanzapine was increased to 7.5 daily.  seen December 17, 2018.  His anxiety was unmanaged.  The following  change was made: Trial 1-1/2 of the 7.5 mg tablets of olanzapine for anxiety.  He is had a stay at [facility] for alcohol dependence since he was last here. Stayed 28 days and DC before Thanksgiving.  Very helpful.  Was having NM nighly before and they stopped since then.  Not drinking.  Involved in AA since then.  seen March 25, 2019.  The patient was doing better requested a reduction in olanzapine to 7.5 mg nightly to improve energy.  visit April 03, 2019.  He had remained sober and his anxiety was improved with sobriety.  He was having problems with low motivation and forgetfulness.  At the next refill it was decided olanzapine would be reduced from 7.5 to 5 mg daily. He called requesting this urgent appointment because of worsening symptoms associated with returning to work.  seen May 21, 2019.  He was doing relatively well.  The following was noted: Tolerating Wellbutrin and it seems to help. He didn't tolerate the increase. Anxiety is improved off alcohol.  Ok to reduce olanzapine to 2.5 mg daily for a month and stop it.  Hopefully low motivation will improve.   He called back March 23 stating he was more depressed and having a harder time doing routine tasks and having suicidal thoughts.  Because of worsening depression after reducing the olanzapine he was encouraged to increase olanzapine back to 5 mg nightly. He called again March 24 stating he was really struggling but was scheduled for an appointment on March 26.  As of June 14, 2019  he reports the following: Every day a real hard struggle to get through the days.  More anxious and depressed equally. Worse 2 weeks.  Last Saturday so overwhelmed by how he feels he had SI.  Everything is hard. Initial benefit paroxetine has been lost.  Awakens stressed and tired. Adequate hours of sleep.  Will be major chore just to mow grass.  Still sober. SE sexual. Getting appt with CPAP doc. Changes made include: Increasing olanzapine back  to 5 mg daily a couple of days prior to the appointment due to phone call and the addition of lithium 300 mg for a few days then increase to 600 mg nightly both to augment the antidepressant and because of suicidal thoughts.  July 15, 2019 appointment, the following is noted: Doing OK with both depression and anxiety.  Kind of like a top always going in mind.  Even when I sleep, when awakens mind still going.  NM nightly for years but less than before stopped drinking.  Smaller and less severe ones that don't wake him.  Theme can't finish things.  Both depression and anxiety 3/10.  More productive.  Paces himself.  Can live with it.  Sleep 9 hours.  Initially olanzapine stopped the ruminating but it's back some.  Overall still benefit.  No SI and can't rmember why he had them before. Residual energy not great and some ruminating. He had some concerns about sexual side effects from Paxil but agreed that no med changes were necessary despite residual symptoms of depression and anxiety.  08/14/2019 appointment, the following is noted:  He scheduled urgently. Vaccinated. Angry easily and exhausted and everything is a chore.  Exhausted. Never had appt with CPAP company.  CPAP is 58 years old and on same settings as in the past.  CPAP machine is not working properly but when it did it was helpful for alertness and energy.  Disc need to call them to have it evaluated. To bed 10-7.  Don't feel rested in morning but used to feel rested when first started paroxetine. Plan: Increase Lithium continue to 900 mg daily for irritability  11/19/19 appt with the following noted: He increased to 900 mg and then reduced it but doesn't know why.  Reduced it to 300 nightly. Irritability much better.  Once anger since here with rage but not outward. Still on paroxetine 40, olanzapine 5, Wellbutrin 75 AM. Getting tired in afternoon and worrying about getting depressed but he's not depressed now.  Don't enjoy things like he  used to.  Les interest and excitement. Appetite and sleep are normal.   Still adjusting without alcohol and socialization. Worrying about lack of sex drive with paroxetine. Can still ruminate on things until he gets some reassurance through talking with people. Plan: Continue low-dose lithium 300 mg daily Continue Paxil.  Did not feel well at 60 mg so we will continue 40 mg.  Likely to lose anxiety benefit if change it. Continue low-dose Wellbutrin 75  Tolerating Wellbutrin and it seems to help. He didn't tolerate the increase.   Continue olanzapine 5 mg  02/18/2020 appointment with following noted: Open to trying increase paroxetine again bc ruminating wears him out and gets fatigued and it wears him out though is 80% better vs before paroxetine. Doesn't remember trying higher paroxetine.  Ruminates on relationships or work.  Whenever he can talk about it with someone it helps tremendously but also realizes paroxetine helps. Anxiety affects dreams and NM too.  Up and down a  little.  Anxiety drove depression before paroxetine.  Best med he's tried. SOBER 1 YEAR SE no problem.  Except gained 25# in 4 years.  No change in diet and exercise.  No increase in appetite and no food cravings.  Seeing PCP soon. Plan: Increase paroxetine trial to 60 for rumination.  Disc SE.  05/26/20 appt noted: No benefit increase paroxetine nor SE.  Wants to reduce back to 30 mg paroxetine and stop lithium bc it didn't seem to help.   4 days of Calm app has seemed to help rumination.  Does it in afternoon and before bed and it seemed to helps.  Doing a 10 min meditation and it helps.   Dep 4/10.  Anxiety 4/10. And a lot better the last few days.   Plan: Reduce lithium by 1 tablet per week.  Call if there is any suicidal thought. Starting April 1 reduce paroxetine to 40 mg daily. Continue low-dose Wellbutrin 75  Tolerating Wellbutrin and it seems to help. He didn't tolerate the increase.   If doing OK will try taper  as he suggested at next visit. Continue olanzapine 5 mg nightly it was helpful for depression and anxiety.  07/01/2020 appt with following noted: Moved appt up. Now says he understated depression level when he was here the last time. Restarted lithium bc felt more depressed without it. Reduced paroxetine to 40 mg daily. Lethargic.  Mind won't calm down.  Nonrestorative sleep but 8 hours.  Doesn't think sleep is deep enough.  Tense. Goes to [employer]. Started counseling. Wants to try something different with meds. Can have mixed sx for 6 weeks with increased interest in things and motivation and want to spend money but still feels depressed.  3 times in the last year. Plan: Increase  lithium back to 3 daily to see if depression is better and less mood cycling.  He admits to feeling worse when he stopped the lithium Continue the reduce paroxetine to 45 mg daily. DC Wellbutrin Lamotrigine trial 25 mg to 100 mg daily over 4 weeks. Continue olanzapine 5 mg nightly it was helpful for depression and anxiety.  08/26/2020 appointment with the following noted: Sad and down.  Classic depression sx and very fatigued.  Unusual to have classic depression bc usually is atypical.  No effect from lamotrigine. Wonders if atorvastatin is causing some confusion or depression bc read about it. Depressed for 3 mos. Gained 20#.  It's leveled off now. Olanzapine helped the rumination and anxiety.   Life is more depressing bc not seeing friends like it was bc stopped drinking.   No SE with lithium.  Unless dropping things. Sleep 1030 to 6 but doesn't sleep deep.  Not rested in AM Plan: Increase  lithium back to 3 daily to see if depression is better and less mood cycling.  He admits to feeling worse when he stopped the lithium Continue the reduce paroxetine to 40 mg daily bc it helped anxiety. Lamotrigine increase to 150 mg daily DT no benefit or SE Increase olanzapine10 mg nightly it was helpful for  depression and anxiety.  Need more help with depression   09/11/2020 phone call: Pt stated he is foegetting things throughout the day and very unsteady.He works with tools and equptment and this is a problem.He stated he takes 6 Lamictal in the am. MD response: We made 3 med changes at the last visit including increasing lamotrigine to 150 mg daily, increasing olanzapine to 10 mg daily, and restarting lithium.  Any 1 of those changes could possibly cause some of the side effects.  We will have to make 1 change at a time to evaluate this.  Therefore reduce lamotrigine to 100 mg daily.  It may take a couple of weeks before he sees a difference.  11/13/2020 phone call: Patient lm stating he is currently taking Lamotrigine 150 mg. He has decreased it to 75 mg due to side effects per pt ( blurred vision, shaking and coordination issues). Patient stated he is discontinuing before his scheduled October appointment. MD response: Take the lamotrigine 75 mg for 2 weeks and he should be able to stop then without withdrawal.  If he gets shakey, nervous then we need to reduce more slowly and let us know that.  Otherwise he can stop if he follows these directions.  11/30/2020 phone call: Pt called and advised he would like to taper off of the Olanzapine.  He has gained 8# since his last visit and 30# total since he started taking the med.  He feels steady, not so much stress now, and would like to see if he can take Lorazepam instead.  Then talk to Dr. Clovis Pu in Oct when they meet. MD response: Reduce olanzapine to one half of the 10 mg tablet at night for 4 weeks and then stop it.  If he gets more depressed then get back in touch with [place] and we will try to do something about the weight gain that olanzapine can cause by using the alternative Lybalvi  12/07/2020 appointment with the following noted: Off lamotrigine .  Reduced olanzapine to 5 mg for a week.  On lithium 900 mg HS, paroxetine 40 Fatigue 10/10.  Dizziness  resolved off lamotrigine.  Low fatigue and motivation.  "I want to want to do things".  Stopped drinking almost 18 mos and socially big adjustment.   Still invloved with AA and daily sponsor.  Working [employer].  [employer] helped. Thinks mild depression with little interest and motivation and no joy.  But not severe.  No SI. No anxiety and it's way better.  Life is more stable and helps.  Working 4 hours daily and occ extrea jobs. Uses new CPAP.  Stil drowsy and fatigue. Plan: Continue CPAP use. New CPAP machine. Trial modafinil 100-200 mg AM Lamotrigine off DT NR Reduced olanzapine to 5 mg daily for a week and well stop in 3 weeks DT wt gain and fatigue  01/20/2021 phone call: I called patient to see if I could get better information. He states he started modafinil a couple of weeks ago, though Rx was written for in September. He states he didn't feel well before taking the modafinil though. He just says he is agitated, irritable, and unstable feeling. When asked what he meant by unstable he stated feeling a "shell of myself".  He has been put on the cancellation list.  MD response: It is likely the modafinil that is causing this problem.  But it may be corrected with a dose reduction.  I assume he is taking a whole tablet.  If that is the case time to reduce to 1/2 tablet in the morning.  If he is taking half a tablet tell him to reduce it to a quarter of a tablet.  And I agree we need to try to get him in as soon as possible. He reduced modafinil from 200 mg to 100 mg daily.  02/10/2021 appointment with the following noted: Current psych meds lithium 900 mg  nightly, paroxetine 40 mg daily, olanzapine recently stopped, modafinil recently tried. Was really bad for awhile feeling depresssed and agitated and anxiety but better now including after reducing modafinil to 100 mg daily. Fatigue is better and in the middle now with energy, not good but not bad. Sleep is better.  CPAP doctor yesterday and  they are gathering info pending. NAC is helping with memory. Still sober and committed to it. SE constipation  Plan: No med changes  03/10/2021 phone call from patient complaining of vague feelings of not feeling well and wanting the Paxil changed.  He was placed on the cancellation list.  03/18/2021 appointment urgently scheduled at his request: Remains on paroxetine 40 mg daily, lithium 1200 mg daily, modafinil 200 mg every morning. "Ruminating"  with sense of need to talk to someone.  Then feels better for awhile.  Mostly on relationships with family.  No one to talk to. Wonders need to chang ethe meds.   Don't socialize anymore since sober.  Does online AA everyday at noon. Lost 10# off olanzapine. Plan: Continue paroxetine 40 mg, lithium 1200 mg daily, Try stopping modafinil '100mg'$  AM to determine if it is helping or not. If not less anxious then start risperidone for rumination 1-2 mg HS  04/05/2021 appointment with the following noted: Stopped  modafinil and it helped reduce anxiety. Started risperidone 2 instead of 1 mg and was off balance so reduced risperidone to 1 mg HS. Only done this for 3 nights.  No SE with it. Anxiety is not close to as bad as originally.  Can be painful but not now. Still ruminating some "my whole adult life" but varies.  Anxiety can lead to depression. 6-7/10 with 10 good on depression with some problems with ambition. Plan: Continue trial risperidone for rumination 1 mg HS  04/07/2021 TC: Several phone calls: Called 2 days after the visit complaining of itching from the lithium and wanting to come off of it.  He was warned he was more emotionally distressed when he reduced the lithium in the past but he wanted to pursue it anyway.  He was continuing risperidone 1 mg nightly but wanting to reduce that also but was warned he would almost certainly have worsening mood severity and anxiety problems based on past experience.  He was cautioned against reducing  lithium below 900 mg daily and risperidone below 1 mg nightly.  He agreed on 04/09/2021.  04/09/2021 he called again and several phone calls ensued thereafter.  He insisted on stopping lithium and risperidone despite warnings that his mood stability and anxiety would worsen. He was prescribed Depakote to take the place  04/20/2021 appointment with the following noted: He stopped risperidone and did not take the Depakote.  He is taking lithium 900 mg nightly and paroxetine 40 mg daily. Stopped risperidone bc feeling agitated and unstable and blamed it.  Still feels the same off of it. Says itching 85% better with less lithium.  Itching for a couple of mos.  Doesn't think anxiety noticeably worse after reducing lithium to 900 mg daily. Not dizzy.  Clumsy with hands. Anxious but not painful like it was before Paxil but tense at the end of the day.  Initially felt normal for the first time in 20 years when first started paxil but not as good now. Not sadness and hasn't had much of it. Started therapy with doctor on demand. Ruminating on "fear of not being fully present" and worries about little things like appts pending.  Afraid of working FT because gets overwhelmed bc "I take it home with me" Plan : Continue trial risperidone but increase to rumination 1.5 mg HS Continue paroxetine 40 and lithium 600 mg   06/01/2021 appointment with the following noted: Haven't seen benefit with risperidone 1.5 mg daily. Not in pain but feels disconnected and not myself.  Has felt like this off and on for years. Level of angst all day but not real high unless triggered.  Still has the rumination. Initially a lot of benefit from paroxetine Not markedly deprssed and not sad.  07/14/21 appt noted:  Doesn't feel much different with switch to fluvoxamine except anxiety well controlled. But still low energy, motivation and not as productive as he wants to be.  Not markedly sad.  Asks about Vraylar. Not much interest  or enjoyment. Don't sleep that great and don't relax that great. On fluvoxamine 200, lithium 900, off risperidone. Plan: Anxiety controlled by fluvoxamine 200 Potentiate Vraylar 1.5 mg daily  07/27/2021 phone call complaining of ongoing depression.  Vraylar increased to 3 mg daily. 07/29/2021 phone call complaining of cost of Vraylar.  Was given samples. 08/20/2021 phone call complaining of fatigue.  Stated he stopped the Vraylar because it made him agitated.  It was recommended there be no further med changes because he is only been off the Vraylar for 1 week.  08/30/2021 appointment noted: Vraylar 1.5 mg didn't do much.  Increased to 3 mg and then felt irritable ans stopped it about 3rd week of May.  Better now. Still extremely tired and feels physically sick with normal workup. Thinks his mind wears him out.  Racing negative thoughts all the time. Asks about Ativan. Always nervous and anxious.  Trouble discerning anxiety from depression. Plan: Anxiety was controlled by fluvoxamine 200 but now he thinks it is worse. So increase 250 mg daily  10/18/21 appt noted: No results with increase fluvoxamine.  No SE. Anxiety is still worse than depression.  Thinks it causes somatic sx like the flu.  Exhausted. GI. Poor sleep and concentration. Asks about lorazepam.   Got checked out by PCP without findings. Plan: Reduce lithium to 2 capsules daily to see if physical symptoms like fatigue etc. are better Increase fluvoxamine to 1 tablet in the morning and 2 tablets in the evening to reduce anxiety Plan Trial reduction of  lithium 900  to 600 mg daily to see if somatic sx are better like fatigue.   01/06/22 appt noted" Everything is about the same with med changes. CC anxiety and fatigue.  Sx started with severe depression in 20s and got better but never resolved. Thinks anxiety is causing fatigue.  Has to nap about 1 pm.  Easily fatigued.  No particular worry other than how he feels and  himself.. Plan: Reduce fluvoxamine gradually to 150 mg daily Start clomipramine 25 mg nightly for 5 days and increase to 75 mg HS  02/07/22 TC:  Pt lvm that the clomiprame 25 mg has been taking the full dose for two weeks now. He is experiencing mania. Overspending etc    MD resp:  Increase the lithium back to 3 daily.  Stop the clomipramine.  Resume the risperidone until these symptoms clear up.  Call us back next week and let us know how it is going.     02/16/22 TC:   Pt was just calling to give you an update.He stated he is still 50 percent manic but feels better.He is currently taking   lithium  carbonate 300 MG 3 daily risperiDONE 3 mg 1/2 tab daily fluvoxamine 100 MG tablet 1/2 tablet in the AM and 1 tablet at night On 11/20 changes were made,you had him stop clomipramine                     and increase lithium to 900 mg daily.  02/16/22 MD: check lithium level.  02/24/22 tC complaining of depression. MD :  He has had recent mood swings from manic to depressed by his report.  Lithium level is low at 0.5 on 600 mg daily.  I think the most helpful thing for his mood would be to increase lithium back to 900 mg daily.      03/07/22 TC: pt reported increasing his Luvox to 200 mg daily.  03/08/22 appt noted: Multiple phone calls since here.   Clomipramine triggered mania like he's never had before.  Increased over the top looking for a house, wanting to invent things, bought a shed and fish tank abruptly.  Left his job which was a good thing.  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  I feel a lot better about it.  Has disability and will do PT work.  I'll be OK. Exhausted and can't be on his feet all the time.   Sleep is different with EMA and ready to go but watches TV awhile and then goes back to sleep.  Always a late sleeper all his life.   Still feeling some depression and he increased the luvox to 200 and it helped. Thinks parents are Autistic and don't respond  emotionally.  M cannot understand feelings and he doesn't get any support.   Plan: Bc recent mania with clomipramine increase lithium 1200  mg daily .    03/29/2022 appointment urgently due to recent mania: Psych meds: fluvoxamine 200, lithium 1200, risperidone 1.5 mg HS Exhausted until the last couple of days. SE tremor Has not had any water today and is lightheaded.   Mood a little better and anxiety better than it was.  No mania. Some underlying weight in mood that only responded to paroxetine and clomipramine. Asks about Wellbutrin for energy. Sleep normal 8-9 hours now.  Except last night. Goes to [facility] groups and support group and AA. Sober Reduce risperidone to 1 mg to see if energy better  05/25/22 appt noted: Reduced lithium about 10 days ago bc thought he might be getting too much bc read on the internet.  No changes since reducing it. More dep for a couple of mos which is unusual.  In bed a lot and more isolated and not normally what I have.  Not real sad but unmotivated and no energy to do things.    Start therapist Friday. No sig sadness.  Irritable and short with people over little things.  Kind of started after stopped drinking.  Worse.  Sleep on and off.  Erratic.  Looks for ward to night so can escape into sleep.   Anxious underlying for 30 years.  Hard to sit and relax and just watch TV.  Tends to ruminate. Asks to try Gaba. Now lethargy is worse than chronic anxiety Reducing risperidone made no changes good or bad.   On disability for 7-8 years.  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  Sober 2 year 01/2019  Past psych meds:  Paxil 60 "too much" + wt gain,   duloxetine, Zoloft, Viibryd 60 NR,  Wellbutrin 75 (max tolerated),  Fluvoxamine 300 no better than 200 which seems to take edge off anxiety Clomipramine mania  Abilify, Rexulti. Vraylar irritable at 3 mg daily.  Buspar stopped DT little effect. Xanax,  Olanzapine 10 fatigue Risperdal 2 once  lithium   900, worse when he stopped lithium Lamotrigine NR CO dizzy  NAC 600 helped Modafinil 200 agitated Took lorazepam  in the past, Xanax less helpful  Clomipramine mania  Review of Systems:  Review of Systems  Constitutional:  Positive for fatigue. Negative for unexpected weight change.  Cardiovascular:  Negative for chest pain.  Gastrointestinal:  Positive for constipation. Negative for nausea.  Genitourinary:  Negative for urgency.  Neurological:  Negative for tremors.  Psychiatric/Behavioral:  Positive for dysphoric mood. Negative for agitation, behavioral problems, confusion, decreased concentration, hallucinations, self-injury, sleep disturbance and suicidal ideas. The patient is nervous/anxious. The patient is not hyperactive.     Medications: I have reviewed the patient's current medications.  Current Outpatient Medications  Medication Sig Dispense Refill   amLODipine (NORVASC) 5 MG tablet TAKE 1 TABLET BY MOUTH EVERY DAY 90 tablet 3   atorvastatin (LIPITOR) 40 MG tablet TAKE 1 TABLET(40 MG) BY MOUTH DAILY 90 tablet 1   fluvoxaMINE (LUVOX) 100 MG tablet Take 2 tablets (200 mg total) by mouth daily. 180 tablet 0   lisinopril (ZESTRIL) 20 MG tablet TAKE 1 TABLET(20 MG) BY MOUTH DAILY 90 tablet 3   lithium carbonate 300 MG capsule Take 4 capsules (1,200 mg total) by mouth at bedtime. (Patient taking differently: Take 900 mg by mouth at bedtime.) 360 capsule 0   lurasidone (LATUDA) 40 MG TABS tablet 1/2 tablet daily for 1 week,  then 1 daily 30 tablet 1   Vitamin D, Ergocalciferol, (DRISDOL) 1.25 MG (50000 UNIT) CAPS capsule Take 1 capsule (50,000 Units total) by mouth every 7 (seven) days. 5 capsule 5   No current facility-administered medications for this visit.    Medication Side Effects: sexual, weight  Allergies: No Known Allergies  Past Medical History:  Diagnosis Date   Anxiety    Bimalleolar fracture of left ankle    Depression    Heart murmur    Sleep apnea     uses CPAP nightly    Family History  Problem Relation Age of Onset   Cancer Maternal Grandfather    Colon cancer Neg Hx    Colon polyps Neg Hx    Esophageal cancer Neg Hx    Rectal cancer Neg Hx    Stomach cancer Neg Hx     Social History   Socioeconomic History   Marital status: Single    Spouse name: Not on file   Number of children: Not on file   Years of education: Not on file   Highest education level: Not on file  Occupational History   Not on file  Tobacco Use   Smoking status: Every Day    Packs/day: 1.50    Years: 0.00    Total pack years: 0.00    Types: Cigarettes    Start date: 03/22/1975   Smokeless tobacco: Never   Tobacco comments:    He reports he has quit 3 times and recently started back in 2012. Hsm   Vaping Use   Vaping Use: Never used  Substance and Sexual Activity   Alcohol use: Not Currently   Drug use: No   Sexual activity: Not on file  Other Topics Concern   Not on file  Social History Narrative   Not on file  Social Determinants of Health   Financial Resource Strain: Low Risk  (07/29/2021)   Overall Financial Resource Strain (CARDIA)    Difficulty of Paying Living Expenses: Not hard at all  Food Insecurity: No Food Insecurity (07/29/2021)   Hunger Vital Sign    Worried About Running Out of Food in the Last Year: Never true    Ran Out of Food in the Last Year: Never true  Transportation Needs: No Transportation Needs (07/29/2021)   PRAPARE - Hydrologist (Medical): No    Lack of Transportation (Non-Medical): No  Physical Activity: Inactive (07/29/2021)   Exercise Vital Sign    Days of Exercise per Week: 0 days    Minutes of Exercise per Session: 0 min  Stress: Stress Concern Present (07/29/2021)   Green    Feeling of Stress : Rather much  Social Connections: Moderately Isolated (07/29/2021)   Social Connection and Isolation Panel [NHANES]     Frequency of Communication with Friends and Family: More than three times a week    Frequency of Social Gatherings with Friends and Family: More than three times a week    Attends Religious Services: Never    Marine scientist or Organizations: Yes    Attends Music therapist: More than 4 times per year    Marital Status: Never married  Intimate Partner Violence: Not At Risk (07/29/2021)   Humiliation, Afraid, Rape, and Kick questionnaire    Fear of Current or Ex-Partner: No    Emotionally Abused: No    Physically Abused: No    Sexually Abused: No    Past Medical History, Surgical history, Social history, and Family history were reviewed and updated as appropriate.   Please see review of systems for further details on the patient's review from today.   Objective:   Physical Exam:  There were no vitals taken for this visit.  Physical Exam Constitutional:      General: He is not in acute distress.    Appearance: Normal appearance. He is well-developed.     Comments: Red face  Musculoskeletal:        General: No deformity.  Neurological:     Mental Status: He is alert and oriented to person, place, and time.     Motor: No tremor.     Coordination: Coordination normal.     Gait: Gait normal.  Psychiatric:        Attention and Perception: Attention normal. He is attentive.        Mood and Affect: Mood is anxious and depressed. Affect is not labile, blunt, angry or inappropriate.        Speech: Speech normal. Speech is not slurred.        Behavior: Behavior normal. Behavior is not agitated or slowed.        Thought Content: Thought content normal. Thought content is not delusional. Thought content does not include homicidal or suicidal ideation. Thought content does not include suicidal plan.        Cognition and Memory: Cognition normal.        Judgment: Judgment normal.     Comments: Insight  Fair. Less anxiety and deprerssion with more lithium       Lab Review:     Component Value Date/Time   NA 141 03/01/2022 0904   K 4.0 03/01/2022 0904   CL 107 03/01/2022 0904   CO2 27 03/01/2022 0904  GLUCOSE 81 03/01/2022 0904   BUN 18 03/01/2022 0904   CREATININE 1.01 03/01/2022 0904   CALCIUM 9.6 03/01/2022 0904   PROT 7.6 06/17/2021 1350   ALBUMIN 4.7 06/17/2021 1350   AST 19 06/17/2021 1350   ALT 23 06/17/2021 1350   ALKPHOS 108 06/17/2021 1350   BILITOT 0.5 06/17/2021 1350       Component Value Date/Time   WBC 8.3 06/17/2021 1350   RBC 5.12 06/17/2021 1350   HGB 15.7 06/17/2021 1350   HGB 15.2 05/05/2010 1049   HCT 46.4 06/17/2021 1350   HCT 44.8 05/05/2010 1049   PLT 229.0 06/17/2021 1350   PLT 193 05/05/2010 1049   MCV 90.7 06/17/2021 1350   MCV 89.6 05/05/2010 1049   MCH 30.4 05/05/2010 1049   MCHC 33.9 06/17/2021 1350   RDW 13.2 06/17/2021 1350   RDW 13.1 05/05/2010 1049   LYMPHSABS 2.6 06/17/2021 1350   LYMPHSABS 1.8 05/05/2010 1049   MONOABS 0.6 06/17/2021 1350   MONOABS 0.4 05/05/2010 1049   EOSABS 0.2 06/17/2021 1350   EOSABS 0.2 05/05/2010 1049   BASOSABS 0.1 06/17/2021 1350   BASOSABS 0.0 05/05/2010 1049    Lithium Lvl  Date Value Ref Range Status  04/01/2022 0.9 0.6 - 1.2 mmol/L Final  04/01/22 lithium level 0.9 on 1200 mg daily  02/02/21 lithium level 0.6 on 900 mg daily  No results found for: "PHENYTOIN", "PHENOBARB", "VALPROATE", "CBMZ"   .res Assessment: Plan:     Kristo was seen today for follow-up, depression and anxiety.  Diagnoses and all orders for this visit:  Bipolar II disorder ([place]) -     lurasidone (LATUDA) 40 MG TABS tablet; 1/2 tablet daily for 1 week,  then 1 daily  GAD (generalized anxiety disorder)  Mixed obsessional thoughts and acts  OSA (obstructive sleep apnea)  Lithium use  Alcohol dependence in remission ([place])  Low vitamin D level  Low serum vitamin B12   Chronic recurrent rumination with anxiety and urgency and sense of need and instablity with  impatience but  but anhedonic depression and anxious.  But not as painful as it was.   Chronic worry and overwhelmed easily and misattributes sx as SE of meds leading to inadequate med trials.  Fear of meds. Now more dep than anxious  Anxiety was controlled by fluvoxamine 200 . No change   he describe some other manic cycling symptoms including increased interest, increased goal-directed behaviors, increased urge to spend that will last for 6 weeks or so and then stop.  He has these cycles about 3 times a year.  This is suggestive of a bipolar predisposition which was confirmed by recent cycle into mania from clomipramine.  Continue CPAP use. New CPAP machine.  Falsely attributes his psych sx to the meds and then stops meds AMA.  False attribution of sx as SE.  Discussed potential metabolic side effects associated with atypical antipsychotics, as well as potential risk for movement side effects. Advised pt to contact office if movement side effects occur.  He agrees.  Call if you have any problems with this.  He remains sober.  He wants to consider counseling to work through some personal issues as well as address specific symptoms like his anger and irritability. Rec local AA instead of online. Disc no BZ bc history alcoholism and high risk tolerance.  Bc recent mania with clomipramine increased lithium 1200  mg daily .   He cut it back to 900 mg daily.  He admits to  feeling worse when he stopped the lithium.  Call if sx get worse. Counseled patient regarding potential benefits, risks, and side effects of lithium to include potential risk of lithium affecting thyroid and renal function.  Discussed need for periodic lab monitoring to determine drug level and to assess for potential adverse effects.  Counseled patient regarding signs and symptoms of lithium toxicity and advised that they notify office immediately or seek urgent medical attention if experiencing these signs and symptoms.  Patient  advised to contact office with any questions or concerns. 02/02/21  02/02/21 lithium level 0.6 on 900 mg daily, normal B12 and vitamin D 52 04/01/22 lithium level 0.9 on 1200 mg daily  02/02/21 lithium level 0.6 on 900 mg daily  Disc the off-label use of N-Acetylcysteine at 600 mg daily to help with mild cognitive problems.  It can be combined with a B-complex vitamin as the B-12 and folate have been shown to sometimes enhance the effect.  Partial benefit so continue NAC 1200 mg daily.  DC risperidone 1 mg HS Add Latuda 1/2 tablet for 1 week then 1 tablet in the the evening  Reads on internet about meds.  Disc this. Needs a lot of reassurance.  Constipation management 1.  Lots of water 2.  Powdered fiber supplement such as MiraLAX, Citrucel, etc. preferably with a meal 3.  2 stool softeners a day 4.  Milk of magnesia or magnesium tablets if needed  Continue vitamin D DT history level 30  FU 8 weeks  Lynder Parents, MD, DFAPA   Please see After Visit Summary for patient specific instructions.  Future Appointments  Date Time Provider [place]  07/26/2022  2:45 PM [facility] HEALTH ADVISOR [facility]    No orders of the defined types were placed in this encounter.       -------------------------------

## 2022-06-15 ENCOUNTER — Telehealth: Payer: Self-pay | Admitting: Psychiatry

## 2022-06-15 NOTE — Telephone Encounter (Signed)
LVM to RC 

## 2022-06-15 NOTE — Telephone Encounter (Signed)
Pt LVM @1 :25p.  He said he saw Dr Clovis Pu recently and he started on a new medicine.  He said it is not helping.  He is just having angst.  He feels like a 5# weight is on him.  Is there something else he can do.  Next appt 5/2

## 2022-06-16 NOTE — Telephone Encounter (Signed)
RTC:  Anxiety is really bad.  Since November hasn't felt himself. So much angst wears him out.   Increase Latuda 80 mg pm He agrees  Lynder Parents, MD, DFAPA

## 2022-06-16 NOTE — Telephone Encounter (Signed)
Patient said he has so much angst, feels like he has 100,000 things to do, but in reality has nothing to do. He is staying in bed due to anxiety rated as 10/10, but he is not sleeping due to worry. He rates depression as 8/10.  He is taking fluvoxamine 200 mg Latuda 40 mg qd. It was prescribed 3/6 to take 1/2 tab x7 days and then a full tab. He said he had been taking a full tab for "several weeks". Lithium 900 mg  Vitamin D 50,000 plus OTC  He is not taking NAC but is taking Prevagen, says it has a lot of vitamin D   I know he has a habit of self-adjusting meds and I asked him if he had been faithful in taking meds as prescribed and he said he had.   From last note 3.6:  Bc recent mania with clomipramine increased lithium 1200 mg daily .   He cut it back to 900 mg daily.  Also was noted that anxiety was controlled with fluvoxamine 200 mg.

## 2022-06-21 ENCOUNTER — Other Ambulatory Visit: Payer: Self-pay | Admitting: Psychiatry

## 2022-06-21 DIAGNOSIS — F422 Mixed obsessional thoughts and acts: Secondary | ICD-10-CM

## 2022-06-21 DIAGNOSIS — F324 Major depressive disorder, single episode, in partial remission: Secondary | ICD-10-CM

## 2022-06-21 DIAGNOSIS — F411 Generalized anxiety disorder: Secondary | ICD-10-CM

## 2022-06-24 ENCOUNTER — Telehealth: Payer: Self-pay | Admitting: Psychiatry

## 2022-06-24 NOTE — Telephone Encounter (Signed)
If he has 40 mg Latuda then increase to 100 mg daily.  If he has 80 mg tabs, then increase to 1 and 1/2 tablets=120 mg daily

## 2022-06-24 NOTE — Telephone Encounter (Signed)
Called patient and he said he has seen no benefit in the lurasidone increase to 80 mg on 3/27. He feels disconnected and anxious. He started a part-time job mid-day but said he stays in bed the majority of the rest of the time. He doesn't sleep well due to the anxiety/worry. He said he is temper is short and angry. He said he is taking all other medications as prescribed.

## 2022-06-24 NOTE — Telephone Encounter (Signed)
Patient has 40 mg and notified him of recommendation to take 100 mg. He said he will let us know in a week how he is doing.

## 2022-06-24 NOTE — Telephone Encounter (Signed)
Pt Lvm @ 2:46p.  He said Dr Jennelle Human increased his Lurasidone and he was told to call back and let him know how it was working.  Pt reports that he does not see much improvement.  He wanted to know if Dr Jennelle Human wanted him to increase from 2 pills to 3 pills over the weekend.  Next appt 5/2

## 2022-07-01 ENCOUNTER — Telehealth: Payer: Self-pay | Admitting: Psychiatry

## 2022-07-01 NOTE — Telephone Encounter (Signed)
Will have to taper Latuda.  Reduce it to about 40 mg daily depending on the size of tablets he has.  Call back about Tuesday and let us know if fatigue resolved.  Would like to see him before we consider what to use in place of Latuda.  Put him on cancellation list.

## 2022-07-01 NOTE — Telephone Encounter (Signed)
Pt called reporting, no improvement with Latuda 100 mg daily. Very exhausted. Pt contact [phone]

## 2022-07-01 NOTE — Telephone Encounter (Signed)
LVM with info per DPR.  

## 2022-07-01 NOTE — Telephone Encounter (Signed)
Patient reporting no improvement with increase in Latuda to 100 mg.  Confirmed he was taking in addition to the Latuda:   Lithium 900 mg  Fluvoxamine 200 mg Vitamin D

## 2022-07-04 ENCOUNTER — Telehealth: Payer: Self-pay | Admitting: Psychiatry

## 2022-07-04 NOTE — Telephone Encounter (Signed)
Error- encounter not needed 

## 2022-07-06 ENCOUNTER — Encounter: Payer: Self-pay | Admitting: Psychiatry

## 2022-07-06 ENCOUNTER — Ambulatory Visit (INDEPENDENT_AMBULATORY_CARE_PROVIDER_SITE_OTHER): Payer: Medicare Other | Admitting: Psychiatry

## 2022-07-06 DIAGNOSIS — E538 Deficiency of other specified B group vitamins: Secondary | ICD-10-CM

## 2022-07-06 DIAGNOSIS — G4733 Obstructive sleep apnea (adult) (pediatric): Secondary | ICD-10-CM | POA: Diagnosis not present

## 2022-07-06 DIAGNOSIS — F422 Mixed obsessional thoughts and acts: Secondary | ICD-10-CM | POA: Diagnosis not present

## 2022-07-06 DIAGNOSIS — R7989 Other specified abnormal findings of blood chemistry: Secondary | ICD-10-CM

## 2022-07-06 DIAGNOSIS — F411 Generalized anxiety disorder: Secondary | ICD-10-CM

## 2022-07-06 DIAGNOSIS — F1021 Alcohol dependence, in remission: Secondary | ICD-10-CM

## 2022-07-06 DIAGNOSIS — F3181 Bipolar II disorder: Secondary | ICD-10-CM | POA: Diagnosis not present

## 2022-07-06 DIAGNOSIS — Z79899 Other long term (current) drug therapy: Secondary | ICD-10-CM

## 2022-07-06 MED ORDER — LURASIDONE HCL 120 MG PO TABS: 120.0000 mg | ORAL_TABLET | Freq: Every day | ORAL | 1 refills | Status: DC

## 2022-07-06 NOTE — Progress Notes (Signed)
Jonathon Walker [id number] 06-25-1965 57 y.o.   Subjective:   Patient ID:  Jonathon Walker is a 57 y.o. (DOB 09-09-1965) male.  Chief Complaint:  Chief Complaint  Patient presents with   Follow-up   Depression   Anxiety   Fatigue    Depression        Associated symptoms include fatigue.  Associated symptoms include no decreased concentration and no suicidal ideas.  Past medical history includes anxiety.   Anxiety Symptoms include nervous/anxious behavior. Patient reports no chest pain, confusion, decreased concentration, nausea or suicidal ideas.       Jonathon Walker presents to the office today for follow-up of anxiety , depression, alcohol.    seen August 7.  His anxiety was unmanaged at Paxil 40 mg and olanzapine 5 mg so olanzapine was increased to 7.5 daily.  seen December 17, 2018.  His anxiety was unmanaged.  The following change was made: Trial 1-1/2 of the 7.5 mg tablets of olanzapine for anxiety.  He is had a stay at [facility] for alcohol dependence since he was last here. Stayed 28 days and DC before Thanksgiving.  Very helpful.  Was having NM nighly before and they stopped since then.  Not drinking.  Involved in AA since then.  seen March 25, 2019.  The patient was doing better requested a reduction in olanzapine to 7.5 mg nightly to improve energy.  visit April 03, 2019.  He had remained sober and his anxiety was improved with sobriety.  He was having problems with low motivation and forgetfulness.  At the next refill it was decided olanzapine would be reduced from 7.5 to 5 mg daily. He called requesting this urgent appointment because of worsening symptoms associated with returning to work.  seen May 21, 2019.  He was doing relatively well.  The following was noted: Tolerating Wellbutrin and it seems to help. He didn't tolerate the increase. Anxiety is improved off alcohol.  Ok to reduce olanzapine to 2.5 mg daily for a month and stop it.  Hopefully  low motivation will improve.   He called back March 23 stating he was more depressed and having a harder time doing routine tasks and having suicidal thoughts.  Because of worsening depression after reducing the olanzapine he was encouraged to increase olanzapine back to 5 mg nightly. He called again March 24 stating he was really struggling but was scheduled for an appointment on March 26.  As of June 14, 2019 he reports the following: Every day a real hard struggle to get through the days.  More anxious and depressed equally. Worse 2 weeks.  Last Saturday so overwhelmed by how he feels he had SI.  Everything is hard. Initial benefit paroxetine has been lost.  Awakens stressed and tired. Adequate hours of sleep.  Will be major chore just to mow grass.  Still sober. SE sexual. Getting appt with CPAP doc. Changes made include: Increasing olanzapine back to 5 mg daily a couple of days prior to the appointment due to phone call and the addition of lithium 300 mg for a few days then increase to 600 mg nightly both to augment the antidepressant and because of suicidal thoughts.  July 15, 2019 appointment, the following is noted: Doing OK with both depression and anxiety.  Kind of like a top always going in mind.  Even when I sleep, when awakens mind still going.  NM nightly for years but less than before stopped drinking.  Smaller and less  severe ones that don't wake him.  Theme can't finish things.  Both depression and anxiety 3/10.  More productive.  Paces himself.  Can live with it.  Sleep 9 hours.  Initially olanzapine stopped the ruminating but it's back some.  Overall still benefit.  No SI and can't rmember why he had them before. Residual energy not great and some ruminating. He had some concerns about sexual side effects from Paxil but agreed that no med changes were necessary despite residual symptoms of depression and anxiety.  08/14/2019 appointment, the following is noted:  He scheduled  urgently. Vaccinated. Angry easily and exhausted and everything is a chore.  Exhausted. Never had appt with CPAP company.  CPAP is 58 years old and on same settings as in the past.  CPAP machine is not working properly but when it did it was helpful for alertness and energy.  Disc need to call them to have it evaluated. To bed 10-7.  Don't feel rested in morning but used to feel rested when first started paroxetine. Plan: Increase Lithium continue to 900 mg daily for irritability  11/19/19 appt with the following noted: He increased to 900 mg and then reduced it but doesn't know why.  Reduced it to 300 nightly. Irritability much better.  Once anger since here with rage but not outward. Still on paroxetine 40, olanzapine 5, Wellbutrin 75 AM. Getting tired in afternoon and worrying about getting depressed but he's not depressed now.  Don't enjoy things like he used to.  Les interest and excitement. Appetite and sleep are normal.   Still adjusting without alcohol and socialization. Worrying about lack of sex drive with paroxetine. Can still ruminate on things until he gets some reassurance through talking with people. Plan: Continue low-dose lithium 300 mg daily Continue Paxil.  Did not feel well at 60 mg so we will continue 40 mg.  Likely to lose anxiety benefit if change it. Continue low-dose Wellbutrin 75  Tolerating Wellbutrin and it seems to help. He didn't tolerate the increase.   Continue olanzapine 5 mg  02/18/2020 appointment with following noted: Open to trying increase paroxetine again bc ruminating wears him out and gets fatigued and it wears him out though is 80% better vs before paroxetine. Doesn't remember trying higher paroxetine.  Ruminates on relationships or work.  Whenever he can talk about it with someone it helps tremendously but also realizes paroxetine helps. Anxiety affects dreams and NM too.  Up and down a little.  Anxiety drove depression before paroxetine.  Best med he's  tried. SOBER 1 YEAR SE no problem.  Except gained 25# in 4 years.  No change in diet and exercise.  No increase in appetite and no food cravings.  Seeing PCP soon. Plan: Increase paroxetine trial to 60 for rumination.  Disc SE.  05/26/20 appt noted: No benefit increase paroxetine nor SE.  Wants to reduce back to 30 mg paroxetine and stop lithium bc it didn't seem to help.   4 days of Calm app has seemed to help rumination.  Does it in afternoon and before bed and it seemed to helps.  Doing a 10 min meditation and it helps.   Dep 4/10.  Anxiety 4/10. And a lot better the last few days.   Plan: Reduce lithium by 1 tablet per week.  Call if there is any suicidal thought. Starting April 1 reduce paroxetine to 40 mg daily. Continue low-dose Wellbutrin 75  Tolerating Wellbutrin and it seems to help. He didn't  tolerate the increase.   If doing OK will try taper as he suggested at next visit. Continue olanzapine 5 mg nightly it was helpful for depression and anxiety.  07/01/2020 appt with following noted: Moved appt up. Now says he understated depression level when he was here the last time. Restarted lithium bc felt more depressed without it. Reduced paroxetine to 40 mg daily. Lethargic.  Mind won't calm down.  Nonrestorative sleep but 8 hours.  Doesn't think sleep is deep enough.  Tense. Goes to [employer]. Started counseling. Wants to try something different with meds. Can have mixed sx for 6 weeks with increased interest in things and motivation and want to spend money but still feels depressed.  3 times in the last year. Plan: Increase  lithium back to 3 daily to see if depression is better and less mood cycling.  He admits to feeling worse when he stopped the lithium Continue the reduce paroxetine to 45 mg daily. DC Wellbutrin Lamotrigine trial 25 mg to 100 mg daily over 4 weeks. Continue olanzapine 5 mg nightly it was helpful for depression and anxiety.  08/26/2020 appointment with  the following noted: Sad and down.  Classic depression sx and very fatigued.  Unusual to have classic depression bc usually is atypical.  No effect from lamotrigine. Wonders if atorvastatin is causing some confusion or depression bc read about it. Depressed for 3 mos. Gained 20#.  It's leveled off now. Olanzapine helped the rumination and anxiety.   Life is more depressing bc not seeing friends like it was bc stopped drinking.   No SE with lithium.  Unless dropping things. Sleep 1030 to 6 but doesn't sleep deep.  Not rested in AM Plan: Increase  lithium back to 3 daily to see if depression is better and less mood cycling.  He admits to feeling worse when he stopped the lithium Continue the reduce paroxetine to 40 mg daily bc it helped anxiety. Lamotrigine increase to 150 mg daily DT no benefit or SE Increase olanzapine10 mg nightly it was helpful for depression and anxiety.  Need more help with depression   09/11/2020 phone call: Pt stated he is foegetting things throughout the day and very unsteady.He works with tools and equptment and this is a problem.He stated he takes 6 Lamictal in the am. MD response: We made 3 med changes at the last visit including increasing lamotrigine to 150 mg daily, increasing olanzapine to 10 mg daily, and restarting lithium.  Any 1 of those changes could possibly cause some of the side effects.  We will have to make 1 change at a time to evaluate this.  Therefore reduce lamotrigine to 100 mg daily.  It may take a couple of weeks before he sees a difference.  11/13/2020 phone call: Patient lm stating he is currently taking Lamotrigine 150 mg. He has decreased it to 75 mg due to side effects per pt ( blurred vision, shaking and coordination issues). Patient stated he is discontinuing before his scheduled October appointment. MD response: Take the lamotrigine 75 mg for 2 weeks and he should be able to stop then without withdrawal.  If he gets shakey, nervous then we need  to reduce more slowly and let us know that.  Otherwise he can stop if he follows these directions.  11/30/2020 phone call: Pt called and advised he would like to taper off of the Olanzapine.  He has gained 8# since his last visit and 30# total since he started taking  the med.  He feels steady, not so much stress now, and would like to see if he can take Lorazepam instead.  Then talk to Dr. Jennelle Human in Oct when they meet. MD response: Reduce olanzapine to one half of the 10 mg tablet at night for 4 weeks and then stop it.  If he gets more depressed then get back in touch with [place] and we will try to do something about the weight gain that olanzapine can cause by using the alternative Lybalvi  12/07/2020 appointment with the following noted: Off lamotrigine .  Reduced olanzapine to 5 mg for a week.  On lithium 900 mg HS, paroxetine 40 Fatigue 10/10.  Dizziness resolved off lamotrigine.  Low fatigue and motivation.  "I want to want to do things".  Stopped drinking almost 18 mos and socially big adjustment.   Still invloved with AA and daily sponsor.  Working [employer].  Reece Levy Young helped. Thinks mild depression with little interest and motivation and no joy.  But not severe.  No SI. No anxiety and it's way better.  Life is more stable and helps.  Working 4 hours daily and occ extrea jobs. Uses new CPAP.  Stil drowsy and fatigue. Plan: Continue CPAP use. New CPAP machine. Trial modafinil 100-200 mg AM Lamotrigine off DT NR Reduced olanzapine to 5 mg daily for a week and well stop in 3 weeks DT wt gain and fatigue  01/20/2021 phone call: I called patient to see if I could get better information. He states he started modafinil a couple of weeks ago, though Rx was written for in September. He states he didn't feel well before taking the modafinil though. He just says he is agitated, irritable, and unstable feeling. When asked what he meant by unstable he stated feeling a "shell of myself".  He has been put on the  cancellation list.  MD response: It is likely the modafinil that is causing this problem.  But it may be corrected with a dose reduction.  I assume he is taking a whole tablet.  If that is the case time to reduce to 1/2 tablet in the morning.  If he is taking half a tablet tell him to reduce it to a quarter of a tablet.  And I agree we need to try to get him in as soon as possible. He reduced modafinil from 200 mg to 100 mg daily.  02/10/2021 appointment with the following noted: Current psych meds lithium 900 mg nightly, paroxetine 40 mg daily, olanzapine recently stopped, modafinil recently tried. Was really bad for awhile feeling depresssed and agitated and anxiety but better now including after reducing modafinil to 100 mg daily. Fatigue is better and in the middle now with energy, not good but not bad. Sleep is better.  CPAP doctor yesterday and they are gathering info pending. NAC is helping with memory. Still sober and committed to it. SE constipation  Plan: No med changes  03/10/2021 phone call from patient complaining of vague feelings of not feeling well and wanting the Paxil changed.  He was placed on the cancellation list.  03/18/2021 appointment urgently scheduled at his request: Remains on paroxetine 40 mg daily, lithium 1200 mg daily, modafinil 200 mg every morning. "Ruminating"  with sense of need to talk to someone.  Then feels better for awhile.  Mostly on relationships with family.  No one to talk to. Wonders need to chang ethe meds.   Don't socialize anymore since sober.  Does online  AA everyday at noon. Lost 10# off olanzapine. Plan: Continue paroxetine 40 mg, lithium 1200 mg daily, Try stopping modafinil 100mg  AM to determine if it is helping or not. If not less anxious then start risperidone for rumination 1-2 mg HS  04/05/2021 appointment with the following noted: Stopped  modafinil and it helped reduce anxiety. Started risperidone 2 instead of 1 mg and was off  balance so reduced risperidone to 1 mg HS. Only done this for 3 nights.  No SE with it. Anxiety is not close to as bad as originally.  Can be painful but not now. Still ruminating some "my whole adult life" but varies.  Anxiety can lead to depression. 6-7/10 with 10 good on depression with some problems with ambition. Plan: Continue trial risperidone for rumination 1 mg HS  04/07/2021 TC: Several phone calls: Called 2 days after the visit complaining of itching from the lithium and wanting to come off of it.  He was warned he was more emotionally distressed when he reduced the lithium in the past but he wanted to pursue it anyway.  He was continuing risperidone 1 mg nightly but wanting to reduce that also but was warned he would almost certainly have worsening mood severity and anxiety problems based on past experience.  He was cautioned against reducing lithium below 900 mg daily and risperidone below 1 mg nightly.  He agreed on 04/09/2021.  04/09/2021 he called again and several phone calls ensued thereafter.  He insisted on stopping lithium and risperidone despite warnings that his mood stability and anxiety would worsen. He was prescribed Depakote to take the place  04/20/2021 appointment with the following noted: He stopped risperidone and did not take the Depakote.  He is taking lithium 900 mg nightly and paroxetine 40 mg daily. Stopped risperidone bc feeling agitated and unstable and blamed it.  Still feels the same off of it. Says itching 85% better with less lithium.  Itching for a couple of mos.  Doesn't think anxiety noticeably worse after reducing lithium to 900 mg daily. Not dizzy.  Clumsy with hands. Anxious but not painful like it was before Paxil but tense at the end of the day.  Initially felt normal for the first time in 20 years when first started paxil but not as good now. Not sadness and hasn't had much of it. Started therapy with doctor on demand. Ruminating on "fear of not  being fully present" and worries about little things like appts pending.   Afraid of working FT because gets overwhelmed bc "I take it home with me" Plan : Continue trial risperidone but increase to rumination 1.5 mg HS Continue paroxetine 40 and lithium 600 mg   06/01/2021 appointment with the following noted: Haven't seen benefit with risperidone 1.5 mg daily. Not in pain but feels disconnected and not myself.  Has felt like this off and on for years. Level of angst all day but not real high unless triggered.  Still has the rumination. Initially a lot of benefit from paroxetine Not markedly deprssed and not sad.  07/14/21 appt noted:  Doesn't feel much different with switch to fluvoxamine except anxiety well controlled. But still low energy, motivation and not as productive as he wants to be.  Not markedly sad.  Asks about Vraylar. Not much interest or enjoyment. Don't sleep that great and don't relax that great. On fluvoxamine 200, lithium 900, off risperidone. Plan: Anxiety controlled by fluvoxamine 200 Potentiate Vraylar 1.5 mg daily  07/27/2021 phone call  complaining of ongoing depression.  Vraylar increased to 3 mg daily. 07/29/2021 phone call complaining of cost of Vraylar.  Was given samples. 08/20/2021 phone call complaining of fatigue.  Stated he stopped the Vraylar because it made him agitated.  It was recommended there be no further med changes because he is only been off the Vraylar for 1 week.  08/30/2021 appointment noted: Vraylar 1.5 mg didn't do much.  Increased to 3 mg and then felt irritable ans stopped it about 3rd week of May.  Better now. Still extremely tired and feels physically sick with normal workup. Thinks his mind wears him out.  Racing negative thoughts all the time. Asks about Ativan. Always nervous and anxious.  Trouble discerning anxiety from depression. Plan: Anxiety was controlled by fluvoxamine 200 but now he thinks it is worse. So increase 250 mg  daily  10/18/21 appt noted: No results with increase fluvoxamine.  No SE. Anxiety is still worse than depression.  Thinks it causes somatic sx like the flu.  Exhausted. GI. Poor sleep and concentration. Asks about lorazepam.   Got checked out by PCP without findings. Plan: Reduce lithium to 2 capsules daily to see if physical symptoms like fatigue etc. are better Increase fluvoxamine to 1 tablet in the morning and 2 tablets in the evening to reduce anxiety Plan Trial reduction of  lithium 900  to 600 mg daily to see if somatic sx are better like fatigue.   01/06/22 appt noted" Everything is about the same with med changes. CC anxiety and fatigue.  Sx started with severe depression in 20s and got better but never resolved. Thinks anxiety is causing fatigue.  Has to nap about 1 pm.  Easily fatigued.  No particular worry other than how he feels and himself.. Plan: Reduce fluvoxamine gradually to 150 mg daily Start clomipramine 25 mg nightly for 5 days and increase to 75 mg HS  02/07/22 TC:  Pt lvm that the clomiprame 25 mg has been taking the full dose for two weeks now. He is experiencing mania. Overspending etc    MD resp:  Increase the lithium back to 3 daily.  Stop the clomipramine.  Resume the risperidone until these symptoms clear up.  Call us back next week and let us know how it is going.     02/16/22 TC:   Pt was just calling to give you an update.He stated he is still 50 percent manic but feels better.He is currently taking   lithium carbonate 300 MG 3 daily risperiDONE 3 mg 1/2 tab daily fluvoxamine 100 MG tablet 1/2 tablet in the AM and 1 tablet at night On 11/20 changes were made,you had him stop clomipramine                     and increase lithium to 900 mg daily.  02/16/22 MD: check lithium level.  02/24/22 tC complaining of depression. MD :  He has had recent mood swings from manic to depressed by his report.  Lithium level is low at 0.5 on 600 mg daily.  I think the  most helpful thing for his mood would be to increase lithium back to 900 mg daily.      03/07/22 TC: pt reported increasing his Luvox to 200 mg daily.  03/08/22 appt noted: Multiple phone calls since here.   Clomipramine triggered mania like he's never had before.  Increased over the top looking for a house, wanting to invent things, bought a shed and  fish tank abruptly.  Left his job which was a good thing.  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  I feel a lot better about it.  Has disability and will do PT work.  I'll be OK. Exhausted and can't be on his feet all the time.   Sleep is different with EMA and ready to go but watches TV awhile and then goes back to sleep.  Always a late sleeper all his life.   Still feeling some depression and he increased the luvox to 200 and it helped. Thinks parents are Autistic and don't respond emotionally.  M cannot understand feelings and he doesn't get any support.   Plan: Bc recent mania with clomipramine increase lithium 1200  mg daily .    03/29/2022 appointment urgently due to recent mania: Psych meds: fluvoxamine 200, lithium 1200, risperidone 1.5 mg HS Exhausted until the last couple of days. SE tremor Has not had any water today and is lightheaded.   Mood a little better and anxiety better than it was.  No mania. Some underlying weight in mood that only responded to paroxetine and clomipramine. Asks about Wellbutrin for energy. Sleep normal 8-9 hours now.  Except last night. Goes to [facility] groups and support group and AA. Sober Reduce risperidone to 1 mg to see if energy better  05/25/22 appt noted: Reduced lithium about 10 days ago bc thought he might be getting too much bc read on the internet.  No changes since reducing it. More dep for a couple of mos which is unusual.  In bed a lot and more isolated and not normally what I have.  Not real sad but unmotivated and no energy to do things.    Start therapist Friday. No sig sadness.   Irritable and short with people over little things.  Kind of started after stopped drinking.  Worse.  Sleep on and off.  Erratic.  Looks for ward to night so can escape into sleep.   Anxious underlying for 30 years.  Hard to sit and relax and just watch TV.  Tends to ruminate. Asks to try Gaba. Now lethargy is worse than chronic anxiety Reducing risperidone made no changes good or bad. DC risperidone 1 mg HS Add Latuda 1/2 tablet for 1 week then 1 tablet in the the evening  07/06/22 appt :  Increased Latuda up to 100 mg .  No clear SE. Exhausted mentally and it makes his body tired.  Working 10-2 and then comes home and lays in bed a couple of hours.  Not a deep sleep.  Never feels fully rested.  Sleep HS 10-7.   Normal appetitie.   Still some sadness and irritability.  Anxiety doesn't seem better but is having less mood swings and mania.  Anxiety wears him down.     On disability for 7-8 years.  Working PT [employer] for alcohol.  Sober 2 year 01/2019  Past psych meds:  Paxil 60 "too much" + wt gain,   duloxetine, Zoloft, Viibryd 60 NR,  Wellbutrin 75 (max tolerated),   Fluvoxamine 300 no better than 200 which seems to take edge off anxiety Clomipramine mania  Abilify, Rexulti. Vraylar irritable at 3 mg daily.  Buspar stopped DT little effect. Xanax,  Olanzapine 10 fatigue Risperdal 2 once  lithium  900, worse when he stopped lithium Lamotrigine NR CO dizzy  NAC 600 helped Modafinil 200 agitated Took lorazepam  in the past, Xanax less helpful  Clomipramine mania  Review of Systems:  Review of Systems  Constitutional:  Positive for fatigue. Negative for unexpected weight change.  Cardiovascular:  Negative for chest pain.  Gastrointestinal:  Positive for constipation. Negative for nausea.  Genitourinary:  Negative for urgency.  Neurological:  Negative for tremors.  Psychiatric/Behavioral:  Positive for dysphoric mood. Negative for agitation, behavioral problems,  confusion, decreased concentration, hallucinations, self-injury, sleep disturbance and suicidal ideas. The patient is nervous/anxious. The patient is not hyperactive.     Medications: I have reviewed the patient's current medications.  Current Outpatient Medications  Medication Sig Dispense Refill   amLODipine (NORVASC) 5 MG tablet TAKE 1 TABLET BY MOUTH EVERY DAY 90 tablet 3   atorvastatin (LIPITOR) 40 MG tablet TAKE 1 TABLET(40 MG) BY MOUTH DAILY 90 tablet 1   fluvoxaMINE (LUVOX) 100 MG tablet TAKE 2 TABLETS(200 MG) BY MOUTH DAILY 120 tablet 0   lisinopril (ZESTRIL) 20 MG tablet TAKE 1 TABLET(20 MG) BY MOUTH DAILY 90 tablet 3   lithium carbonate 300 MG capsule Take 4 capsules (1,200 mg total) by mouth at bedtime. (Patient taking differently: Take 900 mg by mouth at bedtime.) 360 capsule 0   Vitamin D, Ergocalciferol, (DRISDOL) 1.25 MG (50000 UNIT) CAPS capsule Take 1 capsule (50,000 Units total) by mouth every 7 (seven) days. 5 capsule 5   Lurasidone HCl (LATUDA) 120 MG TABS Take 1 tablet (120 mg total) by mouth daily after supper. 30 tablet 1   No current facility-administered medications for this visit.    Medication Side Effects: sexual, weight  Allergies: No Known Allergies  Past Medical History:  Diagnosis Date   Anxiety    Bimalleolar fracture of left ankle    Depression    Heart murmur    Sleep apnea    uses CPAP nightly    Family History  Problem Relation Age of Onset   Cancer Maternal Grandfather    Colon cancer Neg Hx    Colon polyps Neg Hx    Esophageal cancer Neg Hx    Rectal cancer Neg Hx    Stomach cancer Neg Hx     Social History   Socioeconomic History   Marital status: Single    Spouse name: Not on file   Number of children: Not on file   Years of education: Not on file   Highest education level: Not on file  Occupational History   Not on file  Tobacco Use   Smoking status: Every Day    Packs/day: 1.50    Years: 0.00    Additional pack years:  0.00    Total pack years: 0.00    Types: Cigarettes    Start date: 03/22/1975   Smokeless tobacco: Never   Tobacco comments:    He reports he has quit 3 times and recently started back in 2012. Hsm   Vaping Use   Vaping Use: Never used  Substance and Sexual Activity   Alcohol use: Not Currently   Drug use: No   Sexual activity: Not on file  Other Topics Concern   Not on file  Social History Narrative   Not on file   Social Determinants of Health   Financial Resource Strain: Low Risk  (07/29/2021)   Overall Financial Resource Strain (CARDIA)    Difficulty of Paying Living Expenses: Not hard at all  Food Insecurity: No Food Insecurity (07/29/2021)   Hunger Vital Sign    Worried About Running Out of Food in the Last Year: Never true  Ran Out of Food in the Last Year: Never true  Transportation Needs: No Transportation Needs (07/29/2021)   PRAPARE - Administrator, Civil Service (Medical): No    Lack of Transportation (Non-Medical): No  Physical Activity: Inactive (07/29/2021)   Exercise Vital Sign    Days of Exercise per Week: 0 days    Minutes of Exercise per Session: 0 min  Stress: Stress Concern Present (07/29/2021)   [employer] of Occupational Health - Occupational Stress Questionnaire    Feeling of Stress : Rather much  Social Connections: Moderately Isolated (07/29/2021)   Social Connection and Isolation Panel [NHANES]    Frequency of Communication with Friends and Family: More than three times a week    Frequency of Social Gatherings with Friends and Family: More than three times a week    Attends Religious Services: Never    Database administrator or Organizations: Yes    Attends Engineer, structural: More than 4 times per year    Marital Status: Never married  Intimate Partner Violence: Not At Risk (07/29/2021)   Humiliation, Afraid, Rape, and Kick questionnaire    Fear of Current or Ex-Partner: No    Emotionally Abused: No    Physically  Abused: No    Sexually Abused: No    Past Medical History, Surgical history, Social history, and Family history were reviewed and updated as appropriate.   Please see review of systems for further details on the patient's review from today.   Objective:   Physical Exam:  There were no vitals taken for this visit.  Physical Exam Constitutional:      General: He is not in acute distress.    Appearance: Normal appearance. He is well-developed.     Comments: Red face  Musculoskeletal:        General: No deformity.  Neurological:     Mental Status: He is alert and oriented to person, place, and time.     Motor: No tremor.     Coordination: Coordination normal.     Gait: Gait normal.  Psychiatric:        Attention and Perception: Attention normal. He is attentive.        Mood and Affect: Mood is anxious and depressed. Affect is not labile, blunt, angry or inappropriate.        Speech: Speech normal. Speech is not slurred.        Behavior: Behavior normal. Behavior is not agitated or slowed.        Thought Content: Thought content normal. Thought content is not delusional. Thought content does not include homicidal or suicidal ideation. Thought content does not include suicidal plan.        Cognition and Memory: Cognition normal.        Judgment: Judgment normal.     Comments: Insight  Fair. Continued mood problems without much mania but irritable      Lab Review:     Component Value Date/Time   NA 141 03/01/2022 0904   K 4.0 03/01/2022 0904   CL 107 03/01/2022 0904   CO2 27 03/01/2022 0904   GLUCOSE 81 03/01/2022 0904   BUN 18 03/01/2022 0904   CREATININE 1.01 03/01/2022 0904   CALCIUM 9.6 03/01/2022 0904   PROT 7.6 06/17/2021 1350   ALBUMIN 4.7 06/17/2021 1350   AST 19 06/17/2021 1350   ALT 23 06/17/2021 1350   ALKPHOS 108 06/17/2021 1350   BILITOT 0.5 06/17/2021 1350  Component Value Date/Time   WBC 8.3 06/17/2021 1350   RBC 5.12 06/17/2021 1350   HGB  15.7 06/17/2021 1350   HGB 15.2 05/05/2010 1049   HCT 46.4 06/17/2021 1350   HCT 44.8 05/05/2010 1049   PLT 229.0 06/17/2021 1350   PLT 193 05/05/2010 1049   MCV 90.7 06/17/2021 1350   MCV 89.6 05/05/2010 1049   MCH 30.4 05/05/2010 1049   MCHC 33.9 06/17/2021 1350   RDW 13.2 06/17/2021 1350   RDW 13.1 05/05/2010 1049   LYMPHSABS 2.6 06/17/2021 1350   LYMPHSABS 1.8 05/05/2010 1049   MONOABS 0.6 06/17/2021 1350   MONOABS 0.4 05/05/2010 1049   EOSABS 0.2 06/17/2021 1350   EOSABS 0.2 05/05/2010 1049   BASOSABS 0.1 06/17/2021 1350   BASOSABS 0.0 05/05/2010 1049    Lithium Lvl  Date Value Ref Range Status  04/01/2022 0.9 0.6 - 1.2 mmol/L Final  04/01/22 lithium level 0.9 on 1200 mg daily  02/02/21 lithium level 0.6 on 900 mg daily  No results found for: "PHENYTOIN", "PHENOBARB", "VALPROATE", "CBMZ"   .res Assessment: Plan:     Cullan was seen today for follow-up, depression, anxiety and fatigue.  Diagnoses and all orders for this visit:  Bipolar II disorder -     Lurasidone HCl (LATUDA) 120 MG TABS; Take 1 tablet (120 mg total) by mouth daily after supper.  GAD (generalized anxiety disorder)  Mixed obsessional thoughts and acts  OSA (obstructive sleep apnea)  Lithium use  Low vitamin D level  Low serum vitamin B12  Alcohol dependence in remission   Chronic recurrent rumination with anxiety and urgency and sense of need and instablity with impatience but  but anhedonic depression and anxious.  Needs a lot of reassurance. Chronic worry and overwhelmed easily and misattributes sx as SE of meds leading to inadequate med trials.  Fear of meds.Falsely attributes his psych sx to the meds and then stops meds AMA.  False attribution of sx as SE. Discussed the use of a mood chart which may help address some of this part attribution and better delineate mood cycling.  Gave him a copy of the chart reviewed in detail with him as to how to keep it.  After talking about it today  he was able to identify that his ongoing symptoms of depression and anxiety and irritability and fatigue predate Latuda and is not having any side effects with Latuda.  He thinks he is having less mania on Latuda.  Anxiety was better but not gone on fluvoxamine 200 . No change   he describe history other manic cycling symptoms including increased interest, increased goal-directed behaviors, increased urge to spend that will last for 6 weeks or so and then stop.  He has these cycles about 3 times a year.  This is suggestive of a bipolar predisposition which was confirmed by recent cycle into mania from clomipramine.  Continue CPAP use. New CPAP machine.  Discussed potential metabolic side effects associated with atypical antipsychotics, as well as potential risk for movement side effects. Advised pt to contact office if movement side effects occur.  He agrees.  Call if you have any problems with this.  He remains sober.  He wants to consider counseling to work through some personal issues as well as address specific symptoms like his anger and irritability. Rec local AA instead of online. Disc no BZ bc history alcoholism and high risk tolerance.  Bc recent mania with clomipramine increased lithium 1200  mg daily .  He cut it back to 900 mg daily.  He admits to feeling worse when he stopped the lithium.  Call if sx get worse. Counseled patient regarding potential benefits, risks, and side effects of lithium to include potential risk of lithium affecting thyroid and renal function.  Discussed need for periodic lab monitoring to determine drug level and to assess for potential adverse effects.  Counseled patient regarding signs and symptoms of lithium toxicity and advised that they notify office immediately or seek urgent medical attention if experiencing these signs and symptoms.  Patient advised to contact office with any questions or concerns. 02/02/21  02/02/21 lithium level 0.6 on 900 mg daily,  normal B12 and vitamin D 52 04/01/22 lithium level 0.9 on 1200 mg daily  02/02/21 lithium level 0.6 on 900 mg daily  Disc the off-label use of N-Acetylcysteine at 600 mg daily to help with mild cognitive problems.  It can be combined with a B-complex vitamin as the B-12 and folate have been shown to sometimes enhance the effect.  Partial benefit so continue NAC 1200 mg daily.  Increase  Latuda 120 mg in the evening with food  Reads on internet about meds.  Disc this. Needs a lot of reassurance. Reviewed mood chart BEAM in detail. Keep it.  Constipation management 1.  Lots of water 2.  Powdered fiber supplement such as MiraLAX, Citrucel, etc. preferably with a meal 3.  2 stool softeners a day 4.  Milk of magnesia or magnesium tablets if needed  Continue vitamin D DT history level 30 Counseling 20 minutes of session on the items noted above.  FU 8 weeks  Meredith Staggers, MD, DFAPA   Please see After Visit Summary for patient specific instructions.  Future Appointments  Date Time Provider Department Center  07/21/2022  2:30 PM Cottle, Steva Ready., MD [facility] None  07/26/2022  2:45 PM [facility]-ANNUAL WELLNESS VISIT [facility]  09/01/2022 10:30 AM Cottle, Steva Ready., MD [facility] None  10/13/2022 10:30 AM Cottle, Steva Ready., MD [facility] None    No orders of the defined types were placed in this encounter.       -------------------------------

## 2022-07-09 ENCOUNTER — Other Ambulatory Visit: Payer: Self-pay | Admitting: Psychiatry

## 2022-07-09 DIAGNOSIS — F324 Major depressive disorder, single episode, in partial remission: Secondary | ICD-10-CM

## 2022-07-10 NOTE — Telephone Encounter (Signed)
1200 mg prescribed, patient reduced to 900. What is he taking?

## 2022-07-19 ENCOUNTER — Telehealth: Payer: Self-pay | Admitting: Adult Health

## 2022-07-19 NOTE — Telephone Encounter (Signed)
Contacted Jonathon Walker to schedule their annual wellness visit. Appointment made for 07/26/22.  Jonathon Walker AW[facility] direct phone # [phone]  Due to schedule moved to Jonathon Walker schedule 5/7 @ 420  pt aware of time change

## 2022-07-21 ENCOUNTER — Ambulatory Visit: Payer: Medicare Other | Admitting: Adult Health

## 2022-07-21 ENCOUNTER — Ambulatory Visit (INDEPENDENT_AMBULATORY_CARE_PROVIDER_SITE_OTHER): Payer: Medicare Other | Admitting: Psychiatry

## 2022-07-21 VITALS — BP 130/82 | HR 90 | Temp 97.9°F | Ht 75.0 in | Wt 267.0 lb

## 2022-07-21 DIAGNOSIS — E538 Deficiency of other specified B group vitamins: Secondary | ICD-10-CM

## 2022-07-21 DIAGNOSIS — R7989 Other specified abnormal findings of blood chemistry: Secondary | ICD-10-CM

## 2022-07-21 DIAGNOSIS — F411 Generalized anxiety disorder: Secondary | ICD-10-CM

## 2022-07-21 DIAGNOSIS — F32A Depression, unspecified: Secondary | ICD-10-CM | POA: Diagnosis not present

## 2022-07-21 DIAGNOSIS — E559 Vitamin D deficiency, unspecified: Secondary | ICD-10-CM

## 2022-07-21 DIAGNOSIS — G4733 Obstructive sleep apnea (adult) (pediatric): Secondary | ICD-10-CM | POA: Diagnosis not present

## 2022-07-21 DIAGNOSIS — F1021 Alcohol dependence, in remission: Secondary | ICD-10-CM

## 2022-07-21 DIAGNOSIS — R5383 Other fatigue: Secondary | ICD-10-CM | POA: Diagnosis not present

## 2022-07-21 DIAGNOSIS — F3181 Bipolar II disorder: Secondary | ICD-10-CM | POA: Diagnosis not present

## 2022-07-21 DIAGNOSIS — F422 Mixed obsessional thoughts and acts: Secondary | ICD-10-CM

## 2022-07-21 DIAGNOSIS — Z79899 Other long term (current) drug therapy: Secondary | ICD-10-CM

## 2022-07-21 LAB — CBC WITH DIFFERENTIAL/PLATELET
Basophils Absolute: 0.1 10*3/uL (ref 0.0–0.1)
Basophils Relative: 1 % (ref 0.0–3.0)
Eosinophils Absolute: 0.6 10*3/uL (ref 0.0–0.7)
Eosinophils Relative: 6 % — ABNORMAL HIGH (ref 0.0–5.0)
HCT: 45.7 % (ref 39.0–52.0)
Hemoglobin: 15.9 g/dL (ref 13.0–17.0)
Lymphocytes Relative: 26 % (ref 12.0–46.0)
Lymphs Abs: 2.5 10*3/uL (ref 0.7–4.0)
MCHC: 34.8 g/dL (ref 30.0–36.0)
MCV: 89.7 fl (ref 78.0–100.0)
Monocytes Absolute: 0.8 10*3/uL (ref 0.1–1.0)
Monocytes Relative: 9 % (ref 3.0–12.0)
Neutro Abs: 5.5 10*3/uL (ref 1.4–7.7)
Neutrophils Relative %: 58 % (ref 43.0–77.0)
Platelets: 218 10*3/uL (ref 150.0–400.0)
RBC: 5.1 Mil/uL (ref 4.22–5.81)
RDW: 13.4 % (ref 11.5–15.5)
WBC: 9.5 10*3/uL (ref 4.0–10.5)

## 2022-07-21 LAB — VITAMIN D 25 HYDROXY (VIT D DEFICIENCY, FRACTURES): VITD: 38.7 ng/mL (ref 30.00–100.00)

## 2022-07-21 NOTE — Progress Notes (Signed)
Subjective:    Patient ID: Jonathon Walker, male    DOB: September 17, 1965, 57 y.o.   MRN: [id number]  HPI  57 year old male who  has a past medical history of Anxiety, Bimalleolar fracture of left ankle, Depression, Heart murmur, and Sleep apnea.  He presents to the office today for chronic fatigue.  He was last seen in for this roughly a year ago.  Cup was unrevealing except for mildly low vitamin D level, his psychiatrist did put him on a few months of MND 50,000 units weekly back in October 2023.  He does have a history of sleep apnea and wears a CPAP nightly.  He follows up with pulmonary on a regular basis.  He does have a significant history of anxiety and depression with multiple medication changes over the last year.  He does feel as though since the last time I saw him his anxiety improved for short period of time but over the last 6 months the fatigue has come back and has been worse over the last 2 weeks.  He feels as though the fatigue may be due to his depression and anxiety but wants to make sure that he is still not vitamin D deficient.  He is sleeping a lot, comes home from work at 2:00 and will sleep for few hours and then sleep throughout the night.   TSH and BMP were checked roughly 4 months ago and were within normal range.  Zaidi and depression are currently managed with Luvox 200 mg daily and Latuda 120 mg at dinner.   Review of Systems See HPI   Past Medical History:  Diagnosis Date   Anxiety    Bimalleolar fracture of left ankle    Depression    Heart murmur    Sleep apnea    uses CPAP nightly    Social History   Socioeconomic History   Marital status: Single    Spouse name: Not on file   Number of children: Not on file   Years of education: Not on file   Highest education level: Associate degree: occupational, Scientist, product/process development, or vocational program  Occupational History   Not on file  Tobacco Use   Smoking status: Every Day    Packs/day: 1.50    Years:  0.00    Additional pack years: 0.00    Total pack years: 0.00    Types: Cigarettes    Start date: 03/22/1975   Smokeless tobacco: Never   Tobacco comments:    He reports he has quit 3 times and recently started back in 2012. Hsm   Vaping Use   Vaping Use: Never used  Substance and Sexual Activity   Alcohol use: Not Currently   Drug use: No   Sexual activity: Not on file  Other Topics Concern   Not on file  Social History Narrative   Not on file   Social Determinants of Health   Financial Resource Strain: Low Risk  (07/21/2022)   Overall Financial Resource Strain (CARDIA)    Difficulty of Paying Living Expenses: Not hard at all  Food Insecurity: No Food Insecurity (07/21/2022)   Hunger Vital Sign    Worried About Running Out of Food in the Last Year: Never true    Ran Out of Food in the Last Year: Never true  Transportation Needs: No Transportation Needs (07/21/2022)   PRAPARE - Administrator, Civil Service (Medical): No    Lack of Transportation (Non-Medical): No  Physical  Activity: Inactive (07/21/2022)   Exercise Vital Sign    Days of Exercise per Week: 0 days    Minutes of Exercise per Session: 0 min  Stress: Stress Concern Present (07/21/2022)   [employer] of Occupational Health - Occupational Stress Questionnaire    Feeling of Stress : Very much  Social Connections: Moderately Isolated (07/21/2022)   Social Connection and Isolation Panel [NHANES]    Frequency of Communication with Friends and Family: Three times a week    Frequency of Social Gatherings with Friends and Family: Twice a week    Attends Religious Services: Never    Database administrator or Organizations: No    Attends Engineer, structural: More than 4 times per year    Marital Status: Never married  Intimate Partner Violence: Not At Risk (07/29/2021)   Humiliation, Afraid, Rape, and Kick questionnaire    Fear of Current or Ex-Partner: No    Emotionally Abused: No    Physically  Abused: No    Sexually Abused: No    Past Surgical History:  Procedure Laterality Date   ORIF ANKLE FRACTURE Left 09/19/2017   Procedure: OPEN REDUCTION INTERNAL FIXATION (ORIF) ANKLE FRACTURE;  Surgeon: Sheral Apley, MD;  Location: [facility];  Service: Orthopedics;  Laterality: Left;   TOOTH EXTRACTION      Family History  Problem Relation Age of Onset   Cancer Maternal Grandfather    Colon cancer Neg Hx    Colon polyps Neg Hx    Esophageal cancer Neg Hx    Rectal cancer Neg Hx    Stomach cancer Neg Hx     No Known Allergies  Current Outpatient Medications on File Prior to Visit  Medication Sig Dispense Refill   amLODipine (NORVASC) 5 MG tablet TAKE 1 TABLET BY MOUTH EVERY DAY 90 tablet 3   atorvastatin (LIPITOR) 40 MG tablet TAKE 1 TABLET(40 MG) BY MOUTH DAILY 90 tablet 1   fluvoxaMINE (LUVOX) 100 MG tablet TAKE 2 TABLETS(200 MG) BY MOUTH DAILY 120 tablet 0   lithium carbonate 300 MG capsule TAKE 4 CAPSULES(1200 MG) BY MOUTH AT BEDTIME 360 capsule 0   Lurasidone HCl (LATUDA) 120 MG TABS Take 1 tablet (120 mg total) by mouth daily after supper. 30 tablet 1   No current facility-administered medications on file prior to visit.    BP 130/82   Pulse 90   Temp 97.9 F (36.6 C) (Oral)   Ht 6\' 3"  (1.905 m)   Wt 267 lb (121.1 kg)   SpO2 95%   BMI 33.37 kg/m       Objective:   Physical Exam Vitals and nursing note reviewed.  Constitutional:      Appearance: Normal appearance.  Cardiovascular:     Rate and Rhythm: Normal rate and regular rhythm.     Heart sounds: Normal heart sounds.  Pulmonary:     Effort: Pulmonary effort is normal.     Breath sounds: Normal breath sounds.  Musculoskeletal:        General: Normal range of motion.  Skin:    General: Skin is warm and dry.  Neurological:     General: No focal deficit present.     Mental Status: He is alert and oriented to person, place, and time.  Psychiatric:        Mood and Affect: Mood  normal.        Behavior: Behavior normal.        Thought Content: Thought content  normal.        Judgment: Judgment normal.       Assessment & Plan:  1. Fatigue due to depression - PHQ 9 = 17  GAD 7 = 19 - Likely depression/medication induced.  - He has a follow up with psychiatry later today  - CBC with Differential/Platelet; Future - CBC with Differential/Platelet  2. Vitamin D deficiency  - VITAMIN D 25 Hydroxy (Vit-D Deficiency, Fractures); Future - VITAMIN D 25 Hydroxy (Vit-D Deficiency, Fractures)  Shirline Frees, NP

## 2022-07-21 NOTE — Progress Notes (Addendum)
Jonathon Walker [id number] 01/19/1966 57 y.o.   Subjective:   Patient ID:  Jonathon Walker is a 57 y.o. (DOB 03/24/1965) male.  Chief Complaint:  Chief Complaint  Patient presents with   Follow-up   Depression   Anxiety    Depression        Associated symptoms include fatigue.  Associated symptoms include no decreased concentration and no suicidal ideas.  Past medical history includes anxiety.   Anxiety Symptoms include nervous/anxious behavior. Patient reports no chest pain, confusion, decreased concentration, nausea, palpitations or suicidal ideas.       Chriss Walker presents to the office today for follow-up of anxiety , depression, alcohol.    seen August 7.  His anxiety was unmanaged at Paxil 40 mg and olanzapine 5 mg so olanzapine was increased to 7.5 daily.  seen December 17, 2018.  His anxiety was unmanaged.  The following change was made: Trial 1-1/2 of the 7.5 mg tablets of olanzapine for anxiety.  He is had a stay at [facility] for alcohol dependence since he was last here. Stayed 28 days and DC before Thanksgiving.  Very helpful.  Was having NM nighly before and they stopped since then.  Not drinking.  Involved in AA since then.  seen March 25, 2019.  The patient was doing better requested a reduction in olanzapine to 7.5 mg nightly to improve energy.  visit April 03, 2019.  He had remained sober and his anxiety was improved with sobriety.  He was having problems with low motivation and forgetfulness.  At the next refill it was decided olanzapine would be reduced from 7.5 to 5 mg daily. He called requesting this urgent appointment because of worsening symptoms associated with returning to work.  seen May 21, 2019.  He was doing relatively well.  The following was noted: Tolerating Wellbutrin and it seems to help. He didn't tolerate the increase. Anxiety is improved off alcohol.  Ok to reduce olanzapine to 2.5 mg daily for a month and stop it.  Hopefully  low motivation will improve.   He called back March 23 stating he was more depressed and having a harder time doing routine tasks and having suicidal thoughts.  Because of worsening depression after reducing the olanzapine he was encouraged to increase olanzapine back to 5 mg nightly. He called again March 24 stating he was really struggling but was scheduled for an appointment on March 26.  As of June 14, 2019 he reports the following: Every day a real hard struggle to get through the days.  More anxious and depressed equally. Worse 2 weeks.  Last Saturday so overwhelmed by how he feels he had SI.  Everything is hard. Initial benefit paroxetine has been lost.  Awakens stressed and tired. Adequate hours of sleep.  Will be major chore just to mow grass.  Still sober. SE sexual. Getting appt with CPAP doc. Changes made include: Increasing olanzapine back to 5 mg daily a couple of days prior to the appointment due to phone call and the addition of lithium 300 mg for a few days then increase to 600 mg nightly both to augment the antidepressant and because of suicidal thoughts.  July 15, 2019 appointment, the following is noted: Doing OK with both depression and anxiety.  Kind of like a top always going in mind.  Even when I sleep, when awakens mind still going.  NM nightly for years but less than before stopped drinking.  Smaller and less severe ones  that don't wake him.  Theme can't finish things.  Both depression and anxiety 3/10.  More productive.  Paces himself.  Can live with it.  Sleep 9 hours.  Initially olanzapine stopped the ruminating but it's back some.  Overall still benefit.  No SI and can't rmember why he had them before. Residual energy not great and some ruminating. He had some concerns about sexual side effects from Paxil but agreed that no med changes were necessary despite residual symptoms of depression and anxiety.  08/14/2019 appointment, the following is noted:  He scheduled  urgently. Vaccinated. Angry easily and exhausted and everything is a chore.  Exhausted. Never had appt with CPAP company.  CPAP is 58 years old and on same settings as in the past.  CPAP machine is not working properly but when it did it was helpful for alertness and energy.  Disc need to call them to have it evaluated. To bed 10-7.  Don't feel rested in morning but used to feel rested when first started paroxetine. Plan: Increase Lithium continue to 900 mg daily for irritability  11/19/19 appt with the following noted: He increased to 900 mg and then reduced it but doesn't know why.  Reduced it to 300 nightly. Irritability much better.  Once anger since here with rage but not outward. Still on paroxetine 40, olanzapine 5, Wellbutrin 75 AM. Getting tired in afternoon and worrying about getting depressed but he's not depressed now.  Don't enjoy things like he used to.  Les interest and excitement. Appetite and sleep are normal.   Still adjusting without alcohol and socialization. Worrying about lack of sex drive with paroxetine. Can still ruminate on things until he gets some reassurance through talking with people. Plan: Continue low-dose lithium 300 mg daily Continue Paxil.  Did not feel well at 60 mg so we will continue 40 mg.  Likely to lose anxiety benefit if change it. Continue low-dose Wellbutrin 75  Tolerating Wellbutrin and it seems to help. He didn't tolerate the increase.   Continue olanzapine 5 mg  02/18/2020 appointment with following noted: Open to trying increase paroxetine again bc ruminating wears him out and gets fatigued and it wears him out though is 80% better vs before paroxetine. Doesn't remember trying higher paroxetine.  Ruminates on relationships or work.  Whenever he can talk about it with someone it helps tremendously but also realizes paroxetine helps. Anxiety affects dreams and NM too.  Up and down a little.  Anxiety drove depression before paroxetine.  Best med he's  tried. SOBER 1 YEAR SE no problem.  Except gained 25# in 4 years.  No change in diet and exercise.  No increase in appetite and no food cravings.  Seeing PCP soon. Plan: Increase paroxetine trial to 60 for rumination.  Disc SE.  05/26/20 appt noted: No benefit increase paroxetine nor SE.  Wants to reduce back to 30 mg paroxetine and stop lithium bc it didn't seem to help.   4 days of Calm app has seemed to help rumination.  Does it in afternoon and before bed and it seemed to helps.  Doing a 10 min meditation and it helps.   Dep 4/10.  Anxiety 4/10. And a lot better the last few days.   Plan: Reduce lithium by 1 tablet per week.  Call if there is any suicidal thought. Starting April 1 reduce paroxetine to 40 mg daily. Continue low-dose Wellbutrin 75  Tolerating Wellbutrin and it seems to help. He didn't tolerate the  increase.   If doing OK will try taper as he suggested at next visit. Continue olanzapine 5 mg nightly it was helpful for depression and anxiety.  07/01/2020 appt with following noted: Moved appt up. Now says he understated depression level when he was here the last time. Restarted lithium bc felt more depressed without it. Reduced paroxetine to 40 mg daily. Lethargic.  Mind won't calm down.  Nonrestorative sleep but 8 hours.  Doesn't think sleep is deep enough.  Tense. Goes to [employer]. Started counseling. Wants to try something different with meds. Can have mixed sx for 6 weeks with increased interest in things and motivation and want to spend money but still feels depressed.  3 times in the last year. Plan: Increase  lithium back to 3 daily to see if depression is better and less mood cycling.  He admits to feeling worse when he stopped the lithium Continue the reduce paroxetine to 45 mg daily. DC Wellbutrin Lamotrigine trial 25 mg to 100 mg daily over 4 weeks. Continue olanzapine 5 mg nightly it was helpful for depression and anxiety.  08/26/2020 appointment with  the following noted: Sad and down.  Classic depression sx and very fatigued.  Unusual to have classic depression bc usually is atypical.  No effect from lamotrigine. Wonders if atorvastatin is causing some confusion or depression bc read about it. Depressed for 3 mos. Gained 20#.  It's leveled off now. Olanzapine helped the rumination and anxiety.   Life is more depressing bc not seeing friends like it was bc stopped drinking.   No SE with lithium.  Unless dropping things. Sleep 1030 to 6 but doesn't sleep deep.  Not rested in AM Plan: Increase  lithium back to 3 daily to see if depression is better and less mood cycling.  He admits to feeling worse when he stopped the lithium Continue the reduce paroxetine to 40 mg daily bc it helped anxiety. Lamotrigine increase to 150 mg daily DT no benefit or SE Increase olanzapine10 mg nightly it was helpful for depression and anxiety.  Need more help with depression   09/11/2020 phone call: Pt stated he is foegetting things throughout the day and very unsteady.He works with tools and equptment and this is a problem.He stated he takes 6 Lamictal in the am. MD response: We made 3 med changes at the last visit including increasing lamotrigine to 150 mg daily, increasing olanzapine to 10 mg daily, and restarting lithium.  Any 1 of those changes could possibly cause some of the side effects.  We will have to make 1 change at a time to evaluate this.  Therefore reduce lamotrigine to 100 mg daily.  It may take a couple of weeks before he sees a difference.  11/13/2020 phone call: Patient lm stating he is currently taking Lamotrigine 150 mg. He has decreased it to 75 mg due to side effects per pt ( blurred vision, shaking and coordination issues). Patient stated he is discontinuing before his scheduled October appointment. MD response: Take the lamotrigine 75 mg for 2 weeks and he should be able to stop then without withdrawal.  If he gets shakey, nervous then we need  to reduce more slowly and let us know that.  Otherwise he can stop if he follows these directions.  11/30/2020 phone call: Pt called and advised he would like to taper off of the Olanzapine.  He has gained 8# since his last visit and 30# total since he started taking the med.  He feels steady, not so much stress now, and would like to see if he can take Lorazepam instead.  Then talk to Dr. Jennelle Human in Oct when they meet. MD response: Reduce olanzapine to one half of the 10 mg tablet at night for 4 weeks and then stop it.  If he gets more depressed then get back in touch with [place] and we will try to do something about the weight gain that olanzapine can cause by using the alternative Lybalvi  12/07/2020 appointment with the following noted: Off lamotrigine .  Reduced olanzapine to 5 mg for a week.  On lithium 900 mg HS, paroxetine 40 Fatigue 10/10.  Dizziness resolved off lamotrigine.  Low fatigue and motivation.  "I want to want to do things".  Stopped drinking almost 18 mos and socially big adjustment.   Still invloved with AA and daily sponsor.  Working [employer].  Reece Levy Young helped. Thinks mild depression with little interest and motivation and no joy.  But not severe.  No SI. No anxiety and it's way better.  Life is more stable and helps.  Working 4 hours daily and occ extrea jobs. Uses new CPAP.  Stil drowsy and fatigue. Plan: Continue CPAP use. New CPAP machine. Trial modafinil 100-200 mg AM Lamotrigine off DT NR Reduced olanzapine to 5 mg daily for a week and well stop in 3 weeks DT wt gain and fatigue  01/20/2021 phone call: I called patient to see if I could get better information. He states he started modafinil a couple of weeks ago, though Rx was written for in September. He states he didn't feel well before taking the modafinil though. He just says he is agitated, irritable, and unstable feeling. When asked what he meant by unstable he stated feeling a "shell of myself".  He has been put on the  cancellation list.  MD response: It is likely the modafinil that is causing this problem.  But it may be corrected with a dose reduction.  I assume he is taking a whole tablet.  If that is the case time to reduce to 1/2 tablet in the morning.  If he is taking half a tablet tell him to reduce it to a quarter of a tablet.  And I agree we need to try to get him in as soon as possible. He reduced modafinil from 200 mg to 100 mg daily.  02/10/2021 appointment with the following noted: Current psych meds lithium 900 mg nightly, paroxetine 40 mg daily, olanzapine recently stopped, modafinil recently tried. Was really bad for awhile feeling depresssed and agitated and anxiety but better now including after reducing modafinil to 100 mg daily. Fatigue is better and in the middle now with energy, not good but not bad. Sleep is better.  CPAP doctor yesterday and they are gathering info pending. NAC is helping with memory. Still sober and committed to it. SE constipation  Plan: No med changes  03/10/2021 phone call from patient complaining of vague feelings of not feeling well and wanting the Paxil changed.  He was placed on the cancellation list.  03/18/2021 appointment urgently scheduled at his request: Remains on paroxetine 40 mg daily, lithium 1200 mg daily, modafinil 200 mg every morning. "Ruminating"  with sense of need to talk to someone.  Then feels better for awhile.  Mostly on relationships with family.  No one to talk to. Wonders need to chang ethe meds.   Don't socialize anymore since sober.  Does online AA everyday at  noon. Lost 10# off olanzapine. Plan: Continue paroxetine 40 mg, lithium 1200 mg daily, Try stopping modafinil 100mg  AM to determine if it is helping or not. If not less anxious then start risperidone for rumination 1-2 mg HS  04/05/2021 appointment with the following noted: Stopped  modafinil and it helped reduce anxiety. Started risperidone 2 instead of 1 mg and was off  balance so reduced risperidone to 1 mg HS. Only done this for 3 nights.  No SE with it. Anxiety is not close to as bad as originally.  Can be painful but not now. Still ruminating some "my whole adult life" but varies.  Anxiety can lead to depression. 6-7/10 with 10 good on depression with some problems with ambition. Plan: Continue trial risperidone for rumination 1 mg HS  04/07/2021 TC: Several phone calls: Called 2 days after the visit complaining of itching from the lithium and wanting to come off of it.  He was warned he was more emotionally distressed when he reduced the lithium in the past but he wanted to pursue it anyway.  He was continuing risperidone 1 mg nightly but wanting to reduce that also but was warned he would almost certainly have worsening mood severity and anxiety problems based on past experience.  He was cautioned against reducing lithium below 900 mg daily and risperidone below 1 mg nightly.  He agreed on 04/09/2021.  04/09/2021 he called again and several phone calls ensued thereafter.  He insisted on stopping lithium and risperidone despite warnings that his mood stability and anxiety would worsen. He was prescribed Depakote to take the place  04/20/2021 appointment with the following noted: He stopped risperidone and did not take the Depakote.  He is taking lithium 900 mg nightly and paroxetine 40 mg daily. Stopped risperidone bc feeling agitated and unstable and blamed it.  Still feels the same off of it. Says itching 85% better with less lithium.  Itching for a couple of mos.  Doesn't think anxiety noticeably worse after reducing lithium to 900 mg daily. Not dizzy.  Clumsy with hands. Anxious but not painful like it was before Paxil but tense at the end of the day.  Initially felt normal for the first time in 20 years when first started paxil but not as good now. Not sadness and hasn't had much of it. Started therapy with doctor on demand. Ruminating on "fear of not  being fully present" and worries about little things like appts pending.   Afraid of working FT because gets overwhelmed bc "I take it home with me" Plan : Continue trial risperidone but increase to rumination 1.5 mg HS Continue paroxetine 40 and lithium 600 mg   06/01/2021 appointment with the following noted: Haven't seen benefit with risperidone 1.5 mg daily. Not in pain but feels disconnected and not myself.  Has felt like this off and on for years. Level of angst all day but not real high unless triggered.  Still has the rumination. Initially a lot of benefit from paroxetine Not markedly deprssed and not sad.  07/14/21 appt noted:  Doesn't feel much different with switch to fluvoxamine except anxiety well controlled. But still low energy, motivation and not as productive as he wants to be.  Not markedly sad.  Asks about Vraylar. Not much interest or enjoyment. Don't sleep that great and don't relax that great. On fluvoxamine 200, lithium 900, off risperidone. Plan: Anxiety controlled by fluvoxamine 200 Potentiate Vraylar 1.5 mg daily  07/27/2021 phone call complaining of ongoing  depression.  Vraylar increased to 3 mg daily. 07/29/2021 phone call complaining of cost of Vraylar.  Was given samples. 08/20/2021 phone call complaining of fatigue.  Stated he stopped the Vraylar because it made him agitated.  It was recommended there be no further med changes because he is only been off the Vraylar for 1 week.  08/30/2021 appointment noted: Vraylar 1.5 mg didn't do much.  Increased to 3 mg and then felt irritable ans stopped it about 3rd week of May.  Better now. Still extremely tired and feels physically sick with normal workup. Thinks his mind wears him out.  Racing negative thoughts all the time. Asks about Ativan. Always nervous and anxious.  Trouble discerning anxiety from depression. Plan: Anxiety was controlled by fluvoxamine 200 but now he thinks it is worse. So increase 250 mg  daily  10/18/21 appt noted: No results with increase fluvoxamine.  No SE. Anxiety is still worse than depression.  Thinks it causes somatic sx like the flu.  Exhausted. GI. Poor sleep and concentration. Asks about lorazepam.   Got checked out by PCP without findings. Plan: Reduce lithium to 2 capsules daily to see if physical symptoms like fatigue etc. are better Increase fluvoxamine to 1 tablet in the morning and 2 tablets in the evening to reduce anxiety Plan Trial reduction of  lithium 900  to 600 mg daily to see if somatic sx are better like fatigue.   01/06/22 appt noted" Everything is about the same with med changes. CC anxiety and fatigue.  Sx started with severe depression in 20s and got better but never resolved. Thinks anxiety is causing fatigue.  Has to nap about 1 pm.  Easily fatigued.  No particular worry other than how he feels and himself.. Plan: Reduce fluvoxamine gradually to 150 mg daily Start clomipramine 25 mg nightly for 5 days and increase to 75 mg HS  02/07/22 TC:  Pt lvm that the clomiprame 25 mg has been taking the full dose for two weeks now. He is experiencing mania. Overspending etc    MD resp:  Increase the lithium back to 3 daily.  Stop the clomipramine.  Resume the risperidone until these symptoms clear up.  Call us back next week and let us know how it is going.     02/16/22 TC:   Pt was just calling to give you an update.He stated he is still 50 percent manic but feels better.He is currently taking   lithium carbonate 300 MG 3 daily risperiDONE 3 mg 1/2 tab daily fluvoxamine 100 MG tablet 1/2 tablet in the AM and 1 tablet at night On 11/20 changes were made,you had him stop clomipramine                     and increase lithium to 900 mg daily.  02/16/22 MD: check lithium level.  02/24/22 tC complaining of depression. MD :  He has had recent mood swings from manic to depressed by his report.  Lithium level is low at 0.5 on 600 mg daily.  I think the  most helpful thing for his mood would be to increase lithium back to 900 mg daily.      03/07/22 TC: pt reported increasing his Luvox to 200 mg daily.  03/08/22 appt noted: Multiple phone calls since here.   Clomipramine triggered mania like he's never had before.  Increased over the top looking for a house, wanting to invent things, bought a shed and fish tank abruptly.  Left his job which was a good thing.  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  I feel a lot better about it.  Has disability and will do PT work.  I'll be OK. Exhausted and can't be on his feet all the time.   Sleep is different with EMA and ready to go but watches TV awhile and then goes back to sleep.  Always a late sleeper all his life.   Still feeling some depression and he increased the luvox to 200 and it helped. Thinks parents are Autistic and don't respond emotionally.  M cannot understand feelings and he doesn't get any support.   Plan: Bc recent mania with clomipramine increase lithium 1200  mg daily .    03/29/2022 appointment urgently due to recent mania: Psych meds: fluvoxamine 200, lithium 1200, risperidone 1.5 mg HS Exhausted until the last couple of days. SE tremor Has not had any water today and is lightheaded.   Mood a little better and anxiety better than it was.  No mania. Some underlying weight in mood that only responded to paroxetine and clomipramine. Asks about Wellbutrin for energy. Sleep normal 8-9 hours now.  Except last night. Goes to [facility] groups and support group and AA. Sober Reduce risperidone to 1 mg to see if energy better  05/25/22 appt noted: Reduced lithium about 10 days ago bc thought he might be getting too much bc read on the internet.  No changes since reducing it. More dep for a couple of mos which is unusual.  In bed a lot and more isolated and not normally what I have.  Not real sad but unmotivated and no energy to do things.    Start therapist Friday. No sig sadness.   Irritable and short with people over little things.  Kind of started after stopped drinking.  Worse.  Sleep on and off.  Erratic.  Looks for ward to night so can escape into sleep.   Anxious underlying for 30 years.  Hard to sit and relax and just watch TV.  Tends to ruminate. Asks to try Gaba. Now lethargy is worse than chronic anxiety Reducing risperidone made no changes good or bad. DC risperidone 1 mg HS Add Latuda 1/2 tablet for 1 week then 1 tablet in the the evening  07/06/22 appt :  Increased Latuda up to 100 mg .  No clear SE. Exhausted mentally and it makes his body tired.  Working 10-2 and then comes home and lays in bed a couple of hours.  Not a deep sleep.  Never feels fully rested.  Sleep HS 10-7.   Normal appetitie.   Still some sadness and irritability.  Anxiety doesn't seem better but is having less mood swings and mania.  Anxiety wears him down.   Plan: Increase  Latuda 120 mg in the evening with food  07/21/22 appt noted: Trouble keeping mood chart.  If I talk and I'm heard then feels better for hours but then damn stopped up.  Almost like an obsessive thing to talk for relief. No clear effect of increase Latuda except reduced som cognitive anxiety but not physical anxiety. Says there was med he took before paxil that gave him instant relief and wonders about retrying it.  Doesn't know which med. Paxil had best effect when it worked for a period of time. Exhausted but fidget when sits.   Dep and anxious moderate.  On disability for 7-8 years.  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  Sober 2 year 01/2019  Past psych meds:  Paxil 60 "too much" + wt gain,   duloxetine, Zoloft,  Viibryd 60 partial resp (trial after paroxetine),  Trintellix 10 agitated Wellbutrin 75 (max tolerated),   Fluvoxamine 300 no better than 200 which seems to take edge off anxiety Clomipramine mania  Abilify, Rexulti 2. Vraylar irritable at 3 mg daily.  Latuda 120 fidgety  Buspar 30 BID  stopped DT partial effect.  Xanax craving,  Olanzapine 10 fatigue Risperdal 2 once  lithium  900, worse when he stopped lithium Lamotrigine NR CO dizzy  NAC 600 helped Modafinil 200 agitated Took lorazepam  in the past, Xanax less helpful  B severe OCD obsessions psych tx.  Review of Systems:  Review of Systems  Constitutional:  Positive for fatigue. Negative for unexpected weight change.  Cardiovascular:  Negative for chest pain and palpitations.  Gastrointestinal:  Positive for constipation. Negative for nausea.  Genitourinary:  Negative for urgency.  Neurological:  Negative for tremors.  Psychiatric/Behavioral:  Positive for dysphoric mood. Negative for agitation, behavioral problems, confusion, decreased concentration, hallucinations, self-injury, sleep disturbance and suicidal ideas. The patient is nervous/anxious. The patient is not hyperactive.     Medications: I have reviewed the patient's current medications.  Current Outpatient Medications  Medication Sig Dispense Refill   amLODipine (NORVASC) 5 MG tablet TAKE 1 TABLET BY MOUTH EVERY DAY 90 tablet 3   atorvastatin (LIPITOR) 40 MG tablet TAKE 1 TABLET(40 MG) BY MOUTH DAILY 90 tablet 1   fluvoxaMINE (LUVOX) 100 MG tablet TAKE 2 TABLETS(200 MG) BY MOUTH DAILY 120 tablet 0   lithium carbonate 300 MG capsule TAKE 4 CAPSULES(1200 MG) BY MOUTH AT BEDTIME (Patient taking differently: Take 900 mg by mouth at bedtime.) 360 capsule 0   Lurasidone HCl (LATUDA) 120 MG TABS Take 1 tablet (120 mg total) by mouth daily after supper. 30 tablet 1   No current facility-administered medications for this visit.    Medication Side Effects: sexual, weight  Allergies: No Known Allergies  Past Medical History:  Diagnosis Date   Anxiety    Bimalleolar fracture of left ankle    Depression    Heart murmur    Sleep apnea    uses CPAP nightly    Family History  Problem Relation Age of Onset   Cancer Maternal Grandfather    Colon  cancer Neg Hx    Colon polyps Neg Hx    Esophageal cancer Neg Hx    Rectal cancer Neg Hx    Stomach cancer Neg Hx     Social History   Socioeconomic History   Marital status: Single    Spouse name: Not on file   Number of children: Not on file   Years of education: Not on file   Highest education level: Associate degree: occupational, Scientist, product/process development, or vocational program  Occupational History   Not on file  Tobacco Use   Smoking status: Every Day    Packs/day: 1.50    Years: 0.00    Additional pack years: 0.00    Total pack years: 0.00    Types: Cigarettes    Start date: 03/22/1975   Smokeless tobacco: Never   Tobacco comments:    He reports he has quit 3 times and recently started back in 2012. Hsm   Vaping Use   Vaping Use: Never used  Substance and Sexual Activity   Alcohol use: Not Currently   Drug use: No   Sexual activity: Not  on file  Other Topics Concern   Not on file  Social History Narrative   Not on file   Social Determinants of Health   Financial Resource Strain: Low Risk  (07/21/2022)   Overall Financial Resource Strain (CARDIA)    Difficulty of Paying Living Expenses: Not hard at all  Food Insecurity: No Food Insecurity (07/21/2022)   Hunger Vital Sign    Worried About Running Out of Food in the Last Year: Never true    Ran Out of Food in the Last Year: Never true  Transportation Needs: No Transportation Needs (07/21/2022)   PRAPARE - Administrator, Civil Service (Medical): No    Lack of Transportation (Non-Medical): No  Physical Activity: Inactive (07/21/2022)   Exercise Vital Sign    Days of Exercise per Week: 0 days    Minutes of Exercise per Session: 0 min  Stress: Stress Concern Present (07/21/2022)   [employer] of Occupational Health - Occupational Stress Questionnaire    Feeling of Stress : Very much  Social Connections: Moderately Isolated (07/21/2022)   Social Connection and Isolation Panel [NHANES]    Frequency of Communication  with Friends and Family: Three times a week    Frequency of Social Gatherings with Friends and Family: Twice a week    Attends Religious Services: Never    Database administrator or Organizations: No    Attends Engineer, structural: More than 4 times per year    Marital Status: Never married  Intimate Partner Violence: Not At Risk (07/29/2021)   Humiliation, Afraid, Rape, and Kick questionnaire    Fear of Current or Ex-Partner: No    Emotionally Abused: No    Physically Abused: No    Sexually Abused: No    Past Medical History, Surgical history, Social history, and Family history were reviewed and updated as appropriate.   Please see review of systems for further details on the patient's review from today.   Objective:   Physical Exam:  There were no vitals taken for this visit.  Physical Exam Constitutional:      General: He is not in acute distress.    Appearance: Normal appearance. He is well-developed.     Comments: Red face  Musculoskeletal:        General: No deformity.  Neurological:     Mental Status: He is alert and oriented to person, place, and time.     Motor: No tremor.     Coordination: Coordination normal.     Gait: Gait normal.  Psychiatric:        Attention and Perception: Attention normal. He is attentive.        Mood and Affect: Mood is anxious and depressed. Affect is not labile, blunt, angry or inappropriate.        Speech: Speech normal. Speech is not slurred.        Behavior: Behavior normal. Behavior is not agitated or slowed.        Thought Content: Thought content normal. Thought content is not delusional. Thought content does not include homicidal or suicidal ideation. Thought content does not include suicidal plan.        Cognition and Memory: Cognition normal.        Judgment: Judgment normal.     Comments: Insight  Fair. Continued mood problems without much mania but irritable and anxious      Lab Review:     Component Value  Date/Time   NA 141 03/01/2022 0904  K 4.0 03/01/2022 0904   CL 107 03/01/2022 0904   CO2 27 03/01/2022 0904   GLUCOSE 81 03/01/2022 0904   BUN 18 03/01/2022 0904   CREATININE 1.01 03/01/2022 0904   CALCIUM 9.6 03/01/2022 0904   PROT 7.6 06/17/2021 1350   ALBUMIN 4.7 06/17/2021 1350   AST 19 06/17/2021 1350   ALT 23 06/17/2021 1350   ALKPHOS 108 06/17/2021 1350   BILITOT 0.5 06/17/2021 1350       Component Value Date/Time   WBC 8.3 06/17/2021 1350   RBC 5.12 06/17/2021 1350   HGB 15.7 06/17/2021 1350   HGB 15.2 05/05/2010 1049   HCT 46.4 06/17/2021 1350   HCT 44.8 05/05/2010 1049   PLT 229.0 06/17/2021 1350   PLT 193 05/05/2010 1049   MCV 90.7 06/17/2021 1350   MCV 89.6 05/05/2010 1049   MCH 30.4 05/05/2010 1049   MCHC 33.9 06/17/2021 1350   RDW 13.2 06/17/2021 1350   RDW 13.1 05/05/2010 1049   LYMPHSABS 2.6 06/17/2021 1350   LYMPHSABS 1.8 05/05/2010 1049   MONOABS 0.6 06/17/2021 1350   MONOABS 0.4 05/05/2010 1049   EOSABS 0.2 06/17/2021 1350   EOSABS 0.2 05/05/2010 1049   BASOSABS 0.1 06/17/2021 1350   BASOSABS 0.0 05/05/2010 1049    Lithium Lvl  Date Value Ref Range Status  04/01/2022 0.9 0.6 - 1.2 mmol/L Final  04/01/22 lithium level 0.9 on 1200 mg daily  02/02/21 lithium level 0.6 on 900 mg daily  No results found for: "PHENYTOIN", "PHENOBARB", "VALPROATE", "CBMZ"   .res Assessment: Plan:     Filmore was seen today for follow-up, depression and anxiety.  Diagnoses and all orders for this visit:  Bipolar II disorder (HCC)  GAD (generalized anxiety disorder)  Mixed obsessional thoughts and acts  OSA (obstructive sleep apnea)  Lithium use  Low vitamin D level  Low serum vitamin B12  Alcohol dependence in remission (HCC)   Chronic recurrent rumination with anxiety and urgency and sense of need and instablity with impatience but  but anhedonic depression and anxious.  Needs a lot of reassurance. Chronic worry and overwhelmed easily and  misattributes sx as SE of meds leading to inadequate med trials.  Fear of meds.Falsely attributes his psych sx to the meds and then stops meds AMA.  False attribution of sx as SE. Discussed the use of a mood chart which may help address some of this part attribution and better delineate mood cycling.  Gave him a copy of the chart reviewed in detail with him as to how to keep it.  After talking about it today he was able to identify that his ongoing symptoms of depression and anxiety and irritability and fatigue predate Latuda and is not having any side effects with Latuda.  He thinks he is having less mania on Latuda.  Anxiety was better but not gone on fluvoxamine 200 . No change   he describe history other manic cycling symptoms including increased interest, increased goal-directed behaviors, increased urge to spend that will last for 6 weeks or so and then stop.  He has these cycles about 3 times a year.  This is suggestive of a bipolar predisposition which was confirmed by recent cycle into mania from clomipramine.  Continue CPAP use. New CPAP machine.  Discussed potential metabolic side effects associated with atypical antipsychotics, as well as potential risk for movement side effects. Advised pt to contact office if movement side effects occur.  He agrees.  Call if you have any  problems with this.  He remains sober.  He wants to consider counseling to work through some personal issues as well as address specific symptoms like his anger and irritability. Rec local AA instead of online. Consider clonazepam DT TR status.   Bc recent mania with clomipramine increased lithium 1200  mg daily .   He cut it back to 900 mg daily.  He admits to feeling worse when he stopped the lithium.  Call if sx get worse. Counseled patient regarding potential benefits, risks, and side effects of lithium to include potential risk of lithium affecting thyroid and renal function.  Discussed need for periodic lab  monitoring to determine drug level and to assess for potential adverse effects.  Counseled patient regarding signs and symptoms of lithium toxicity and advised that they notify office immediately or seek urgent medical attention if experiencing these signs and symptoms.  Patient advised to contact office with any questions or concerns. 02/02/21  02/02/21 lithium level 0.6 on 900 mg daily, normal B12 and vitamin D 52 04/01/22 lithium level 0.9 on 1200 mg daily  02/02/21 lithium level 0.6 on 900 mg daily  Disc the off-label use of N-Acetylcysteine at 600 mg daily to help with mild cognitive problems.  It can be combined with a B-complex vitamin as the B-12 and folate have been shown to sometimes enhance the effect.  Partial benefit so continue NAC 1200 mg daily.  continue  Latuda 120 mg in the evening with food.  Consider increase but fidgety  Reads on internet about meds.  Disc this. Needs a lot of reassurance. Reviewed mood chart BEAM in detail. Keep it.  Constipation management 1.  Lots of water 2.  Powdered fiber supplement such as MiraLAX, Citrucel, etc. preferably with a meal 3.  2 stool softeners a day 4.  Milk of magnesia or magnesium tablets if needed  Continue vitamin D DT history level 30 Counseling 20 minutes of session on the items noted above.  Check old chart and see wht he took before paroxetine that he says worked so well for worry; buspirone or Viibryd  FU 8 weeks  Meredith Staggers, MD, DFAPA   Please see After Visit Summary for patient specific instructions.  Future Appointments  Date Time Provider Department Center  07/26/2022  4:20 PM Terressa Koyanagi, DO [facility]  09/01/2022 10:30 AM Cottle, Steva Ready., MD [facility] None  10/13/2022 10:30 AM Cottle, Steva Ready., MD [facility] None    No orders of the defined types were placed in this encounter.       -------------------------------

## 2022-07-26 ENCOUNTER — Ambulatory Visit (INDEPENDENT_AMBULATORY_CARE_PROVIDER_SITE_OTHER): Payer: Medicare Other | Admitting: Family Medicine

## 2022-07-26 DIAGNOSIS — Z91199 Patient's noncompliance with other medical treatment and regimen due to unspecified reason: Secondary | ICD-10-CM

## 2022-07-26 NOTE — Progress Notes (Unsigned)
No show for virtual AWV. Both nurse and I called multiple times at multiple numbers. LM to call office.

## 2022-07-27 ENCOUNTER — Telehealth: Payer: Self-pay | Admitting: Psychiatry

## 2022-07-27 NOTE — Telephone Encounter (Signed)
I've sent a request for the chart again.

## 2022-07-27 NOTE — Telephone Encounter (Signed)
Please see message from patient. I called him and he said you are aware of how he is doing and he just wants to find out about a medication taken previously.

## 2022-07-27 NOTE — Telephone Encounter (Signed)
error 

## 2022-07-27 NOTE — Telephone Encounter (Signed)
Pt called at 9:26.  He said he was seen last week by Dr Jennelle Human.  Dr Jennelle Human was going to request files from storage and then decided course of medication for pt between Klonopin or something else.  Pt said he is in "pretty bad shape".  He wants to know what Dr Jennelle Human found out.  Next appt 6/13

## 2022-07-29 ENCOUNTER — Other Ambulatory Visit: Payer: Self-pay | Admitting: Psychiatry

## 2022-07-29 ENCOUNTER — Telehealth: Payer: Self-pay | Admitting: Psychiatry

## 2022-07-29 MED ORDER — CLONAZEPAM 0.5 MG PO TABS: 0.5000 mg | ORAL_TABLET | Freq: Two times a day (BID) | ORAL | 0 refills | Status: DC | PRN

## 2022-07-29 NOTE — Telephone Encounter (Signed)
Nevermind previous message.  I had overlooked he's still taking fluvoxamine which he can't take with Viibryd.  I'll send in clonazepam for him to try.  No Viibryd until his appt with me and we will discuss then.

## 2022-07-29 NOTE — Telephone Encounter (Signed)
Reviewed old chart and meds he took prior to paroxetine he thinks might have helped. old chart and see wht he took before paroxetine that he says worked so well for worry; buspirone or Viibryd   He can retry one or the other.  Buspirone is anxiety med.  Viibryd is antidepressant with anxiety benefit.

## 2022-07-29 NOTE — Telephone Encounter (Signed)
Patient had called again today to ask for change in medication. He said he thought he took the medication at night, but didn't think it was Viibryd. He said he is pretty much bedridden. He mentions Klonopin. He wants you to send in something today.  He feels it is depression, but said you feel like it is anxiety.   Pharmacy - [facility] [address]

## 2022-07-29 NOTE — Telephone Encounter (Signed)
This was addressed in another message.

## 2022-07-29 NOTE — Telephone Encounter (Signed)
Pt lvm another message stating that he wants a med adjustment. He is not able to go work. Please call him at [phone]

## 2022-07-29 NOTE — Telephone Encounter (Signed)
I think he should go back on the Viibryd bc it hleps both anxiety and depression.  I will send in RX.   I will also agree for him to try clonazepam for anxiety as needed  0.5 mg BID prn.  Will send RX

## 2022-08-01 NOTE — Telephone Encounter (Signed)
LVM with info per DPR.  

## 2022-08-17 ENCOUNTER — Telehealth: Payer: Self-pay | Admitting: Adult Health

## 2022-08-17 ENCOUNTER — Telehealth: Payer: Self-pay | Admitting: Psychiatry

## 2022-08-17 NOTE — Telephone Encounter (Signed)
Spoke with the patient and informed him the forms will be returned to the front desk for pick up as below.

## 2022-08-17 NOTE — Telephone Encounter (Signed)
Certification of Disability for Property Tax Exclusion form to be filled out--placed in dr's folder.  Patient will pick up once complete.

## 2022-08-17 NOTE — Telephone Encounter (Signed)
Received disability form from [employer]. Placed in Traci's box to complete.

## 2022-08-18 ENCOUNTER — Other Ambulatory Visit: Payer: Self-pay | Admitting: Psychiatry

## 2022-08-19 DIAGNOSIS — Z0289 Encounter for other administrative examinations: Secondary | ICD-10-CM

## 2022-08-19 NOTE — Telephone Encounter (Signed)
Paper work given to Dr. Jennelle Human to sign

## 2022-09-01 ENCOUNTER — Ambulatory Visit: Payer: Medicare Other | Admitting: Psychiatry

## 2022-09-01 ENCOUNTER — Encounter: Payer: Self-pay | Admitting: Psychiatry

## 2022-09-01 DIAGNOSIS — F3181 Bipolar II disorder: Secondary | ICD-10-CM | POA: Diagnosis not present

## 2022-09-01 DIAGNOSIS — E538 Deficiency of other specified B group vitamins: Secondary | ICD-10-CM

## 2022-09-01 DIAGNOSIS — F411 Generalized anxiety disorder: Secondary | ICD-10-CM | POA: Diagnosis not present

## 2022-09-01 DIAGNOSIS — T43505A Adverse effect of unspecified antipsychotics and neuroleptics, initial encounter: Secondary | ICD-10-CM

## 2022-09-01 DIAGNOSIS — F422 Mixed obsessional thoughts and acts: Secondary | ICD-10-CM | POA: Diagnosis not present

## 2022-09-01 DIAGNOSIS — Z79899 Other long term (current) drug therapy: Secondary | ICD-10-CM

## 2022-09-01 DIAGNOSIS — R7989 Other specified abnormal findings of blood chemistry: Secondary | ICD-10-CM

## 2022-09-01 DIAGNOSIS — G2571 Drug induced akathisia: Secondary | ICD-10-CM | POA: Diagnosis not present

## 2022-09-01 DIAGNOSIS — F1021 Alcohol dependence, in remission: Secondary | ICD-10-CM

## 2022-09-01 DIAGNOSIS — G4733 Obstructive sleep apnea (adult) (pediatric): Secondary | ICD-10-CM

## 2022-09-01 MED ORDER — LURASIDONE HCL 60 MG PO TABS
60.0000 mg | ORAL_TABLET | Freq: Every evening | ORAL | 0 refills | Status: DC
Start: 2022-09-01 — End: 2022-11-14

## 2022-09-01 NOTE — Patient Instructions (Signed)
Reduce Latuda to 60 mg daily. Start samples of Auvelity 1 in the AM

## 2022-09-01 NOTE — Progress Notes (Signed)
Jonathon Walker [id number] 01/19/1966 57 y.o.   Subjective:   Patient ID:  Jonathon Walker is a 57 y.o. (DOB 03/24/1965) male.  Chief Complaint:  Chief Complaint  Patient presents with   Follow-up   Depression   Anxiety    Depression        Associated symptoms include fatigue.  Associated symptoms include no decreased concentration and no suicidal ideas.  Past medical history includes anxiety.   Anxiety Symptoms include nervous/anxious behavior. Patient reports no chest pain, confusion, decreased concentration, nausea, palpitations or suicidal ideas.       Jonathon Walker presents to the office today for follow-up of anxiety , depression, alcohol.    seen August 7.  His anxiety was unmanaged at Paxil 40 mg and olanzapine 5 mg so olanzapine was increased to 7.5 daily.  seen December 17, 2018.  His anxiety was unmanaged.  The following change was made: Trial 1-1/2 of the 7.5 mg tablets of olanzapine for anxiety.  He is had a stay at [facility] for alcohol dependence since he was last here. Stayed 28 days and DC before Thanksgiving.  Very helpful.  Was having NM nighly before and they stopped since then.  Not drinking.  Involved in AA since then.  seen March 25, 2019.  The patient was doing better requested a reduction in olanzapine to 7.5 mg nightly to improve energy.  visit April 03, 2019.  He had remained sober and his anxiety was improved with sobriety.  He was having problems with low motivation and forgetfulness.  At the next refill it was decided olanzapine would be reduced from 7.5 to 5 mg daily. He called requesting this urgent appointment because of worsening symptoms associated with returning to work.  seen May 21, 2019.  He was doing relatively well.  The following was noted: Tolerating Wellbutrin and it seems to help. He didn't tolerate the increase. Anxiety is improved off alcohol.  Ok to reduce olanzapine to 2.5 mg daily for a month and stop it.  Hopefully  low motivation will improve.   He called back March 23 stating he was more depressed and having a harder time doing routine tasks and having suicidal thoughts.  Because of worsening depression after reducing the olanzapine he was encouraged to increase olanzapine back to 5 mg nightly. He called again March 24 stating he was really struggling but was scheduled for an appointment on March 26.  As of June 14, 2019 he reports the following: Every day a real hard struggle to get through the days.  More anxious and depressed equally. Worse 2 weeks.  Last Saturday so overwhelmed by how he feels he had SI.  Everything is hard. Initial benefit paroxetine has been lost.  Awakens stressed and tired. Adequate hours of sleep.  Will be major chore just to mow grass.  Still sober. SE sexual. Getting appt with CPAP doc. Changes made include: Increasing olanzapine back to 5 mg daily a couple of days prior to the appointment due to phone call and the addition of lithium 300 mg for a few days then increase to 600 mg nightly both to augment the antidepressant and because of suicidal thoughts.  July 15, 2019 appointment, the following is noted: Doing OK with both depression and anxiety.  Kind of like a top always going in mind.  Even when I sleep, when awakens mind still going.  NM nightly for years but less than before stopped drinking.  Smaller and less severe ones  that don't wake him.  Theme can't finish things.  Both depression and anxiety 3/10.  More productive.  Paces himself.  Can live with it.  Sleep 9 hours.  Initially olanzapine stopped the ruminating but it's back some.  Overall still benefit.  No SI and can't rmember why he had them before. Residual energy not great and some ruminating. He had some concerns about sexual side effects from Paxil but agreed that no med changes were necessary despite residual symptoms of depression and anxiety.  08/14/2019 appointment, the following is noted:  He scheduled  urgently. Vaccinated. Angry easily and exhausted and everything is a chore.  Exhausted. Never had appt with CPAP company.  CPAP is 58 years old and on same settings as in the past.  CPAP machine is not working properly but when it did it was helpful for alertness and energy.  Disc need to call them to have it evaluated. To bed 10-7.  Don't feel rested in morning but used to feel rested when first started paroxetine. Plan: Increase Lithium continue to 900 mg daily for irritability  11/19/19 appt with the following noted: He increased to 900 mg and then reduced it but doesn't know why.  Reduced it to 300 nightly. Irritability much better.  Once anger since here with rage but not outward. Still on paroxetine 40, olanzapine 5, Wellbutrin 75 AM. Getting tired in afternoon and worrying about getting depressed but he's not depressed now.  Don't enjoy things like he used to.  Les interest and excitement. Appetite and sleep are normal.   Still adjusting without alcohol and socialization. Worrying about lack of sex drive with paroxetine. Can still ruminate on things until he gets some reassurance through talking with people. Plan: Continue low-dose lithium 300 mg daily Continue Paxil.  Did not feel well at 60 mg so we will continue 40 mg.  Likely to lose anxiety benefit if change it. Continue low-dose Wellbutrin 75  Tolerating Wellbutrin and it seems to help. He didn't tolerate the increase.   Continue olanzapine 5 mg  02/18/2020 appointment with following noted: Open to trying increase paroxetine again bc ruminating wears him out and gets fatigued and it wears him out though is 80% better vs before paroxetine. Doesn't remember trying higher paroxetine.  Ruminates on relationships or work.  Whenever he can talk about it with someone it helps tremendously but also realizes paroxetine helps. Anxiety affects dreams and NM too.  Up and down a little.  Anxiety drove depression before paroxetine.  Best med he's  tried. SOBER 1 YEAR SE no problem.  Except gained 25# in 4 years.  No change in diet and exercise.  No increase in appetite and no food cravings.  Seeing PCP soon. Plan: Increase paroxetine trial to 60 for rumination.  Disc SE.  05/26/20 appt noted: No benefit increase paroxetine nor SE.  Wants to reduce back to 30 mg paroxetine and stop lithium bc it didn't seem to help.   4 days of Calm app has seemed to help rumination.  Does it in afternoon and before bed and it seemed to helps.  Doing a 10 min meditation and it helps.   Dep 4/10.  Anxiety 4/10. And a lot better the last few days.   Plan: Reduce lithium by 1 tablet per week.  Call if there is any suicidal thought. Starting April 1 reduce paroxetine to 40 mg daily. Continue low-dose Wellbutrin 75  Tolerating Wellbutrin and it seems to help. He didn't tolerate the  increase.   If doing OK will try taper as he suggested at next visit. Continue olanzapine 5 mg nightly it was helpful for depression and anxiety.  07/01/2020 appt with following noted: Moved appt up. Now says he understated depression level when he was here the last time. Restarted lithium bc felt more depressed without it. Reduced paroxetine to 40 mg daily. Lethargic.  Mind won't calm down.  Nonrestorative sleep but 8 hours.  Doesn't think sleep is deep enough.  Tense. Goes to [employer]. Started counseling. Wants to try something different with meds. Can have mixed sx for 6 weeks with increased interest in things and motivation and want to spend money but still feels depressed.  3 times in the last year. Plan: Increase  lithium back to 3 daily to see if depression is better and less mood cycling.  He admits to feeling worse when he stopped the lithium Continue the reduce paroxetine to 45 mg daily. DC Wellbutrin Lamotrigine trial 25 mg to 100 mg daily over 4 weeks. Continue olanzapine 5 mg nightly it was helpful for depression and anxiety.  08/26/2020 appointment with  the following noted: Sad and down.  Classic depression sx and very fatigued.  Unusual to have classic depression bc usually is atypical.  No effect from lamotrigine. Wonders if atorvastatin is causing some confusion or depression bc read about it. Depressed for 3 mos. Gained 20#.  It's leveled off now. Olanzapine helped the rumination and anxiety.   Life is more depressing bc not seeing friends like it was bc stopped drinking.   No SE with lithium.  Unless dropping things. Sleep 1030 to 6 but doesn't sleep deep.  Not rested in AM Plan: Increase  lithium back to 3 daily to see if depression is better and less mood cycling.  He admits to feeling worse when he stopped the lithium Continue the reduce paroxetine to 40 mg daily bc it helped anxiety. Lamotrigine increase to 150 mg daily DT no benefit or SE Increase olanzapine10 mg nightly it was helpful for depression and anxiety.  Need more help with depression   09/11/2020 phone call: Pt stated he is foegetting things throughout the day and very unsteady.He works with tools and equptment and this is a problem.He stated he takes 6 Lamictal in the am. MD response: We made 3 med changes at the last visit including increasing lamotrigine to 150 mg daily, increasing olanzapine to 10 mg daily, and restarting lithium.  Any 1 of those changes could possibly cause some of the side effects.  We will have to make 1 change at a time to evaluate this.  Therefore reduce lamotrigine to 100 mg daily.  It may take a couple of weeks before he sees a difference.  11/13/2020 phone call: Patient lm stating he is currently taking Lamotrigine 150 mg. He has decreased it to 75 mg due to side effects per pt ( blurred vision, shaking and coordination issues). Patient stated he is discontinuing before his scheduled October appointment. MD response: Take the lamotrigine 75 mg for 2 weeks and he should be able to stop then without withdrawal.  If he gets shakey, nervous then we need  to reduce more slowly and let us know that.  Otherwise he can stop if he follows these directions.  11/30/2020 phone call: Pt called and advised he would like to taper off of the Olanzapine.  He has gained 8# since his last visit and 30# total since he started taking the med.  He feels steady, not so much stress now, and would like to see if he can take Lorazepam instead.  Then talk to Dr. Jennelle Human in Oct when they meet. MD response: Reduce olanzapine to one half of the 10 mg tablet at night for 4 weeks and then stop it.  If he gets more depressed then get back in touch with [place] and we will try to do something about the weight gain that olanzapine can cause by using the alternative Lybalvi  12/07/2020 appointment with the following noted: Off lamotrigine .  Reduced olanzapine to 5 mg for a week.  On lithium 900 mg HS, paroxetine 40 Fatigue 10/10.  Dizziness resolved off lamotrigine.  Low fatigue and motivation.  "I want to want to do things".  Stopped drinking almost 18 mos and socially big adjustment.   Still invloved with AA and daily sponsor.  Working [employer].  Reece Levy Young helped. Thinks mild depression with little interest and motivation and no joy.  But not severe.  No SI. No anxiety and it's way better.  Life is more stable and helps.  Working 4 hours daily and occ extrea jobs. Uses new CPAP.  Stil drowsy and fatigue. Plan: Continue CPAP use. New CPAP machine. Trial modafinil 100-200 mg AM Lamotrigine off DT NR Reduced olanzapine to 5 mg daily for a week and well stop in 3 weeks DT wt gain and fatigue  01/20/2021 phone call: I called patient to see if I could get better information. He states he started modafinil a couple of weeks ago, though Rx was written for in September. He states he didn't feel well before taking the modafinil though. He just says he is agitated, irritable, and unstable feeling. When asked what he meant by unstable he stated feeling a "shell of myself".  He has been put on the  cancellation list.  MD response: It is likely the modafinil that is causing this problem.  But it may be corrected with a dose reduction.  I assume he is taking a whole tablet.  If that is the case time to reduce to 1/2 tablet in the morning.  If he is taking half a tablet tell him to reduce it to a quarter of a tablet.  And I agree we need to try to get him in as soon as possible. He reduced modafinil from 200 mg to 100 mg daily.  02/10/2021 appointment with the following noted: Current psych meds lithium 900 mg nightly, paroxetine 40 mg daily, olanzapine recently stopped, modafinil recently tried. Was really bad for awhile feeling depresssed and agitated and anxiety but better now including after reducing modafinil to 100 mg daily. Fatigue is better and in the middle now with energy, not good but not bad. Sleep is better.  CPAP doctor yesterday and they are gathering info pending. NAC is helping with memory. Still sober and committed to it. SE constipation  Plan: No med changes  03/10/2021 phone call from patient complaining of vague feelings of not feeling well and wanting the Paxil changed.  He was placed on the cancellation list.  03/18/2021 appointment urgently scheduled at his request: Remains on paroxetine 40 mg daily, lithium 1200 mg daily, modafinil 200 mg every morning. "Ruminating"  with sense of need to talk to someone.  Then feels better for awhile.  Mostly on relationships with family.  No one to talk to. Wonders need to chang ethe meds.   Don't socialize anymore since sober.  Does online AA everyday at  noon. Lost 10# off olanzapine. Plan: Continue paroxetine 40 mg, lithium 1200 mg daily, Try stopping modafinil 100mg  AM to determine if it is helping or not. If not less anxious then start risperidone for rumination 1-2 mg HS  04/05/2021 appointment with the following noted: Stopped  modafinil and it helped reduce anxiety. Started risperidone 2 instead of 1 mg and was off  balance so reduced risperidone to 1 mg HS. Only done this for 3 nights.  No SE with it. Anxiety is not close to as bad as originally.  Can be painful but not now. Still ruminating some "my whole adult life" but varies.  Anxiety can lead to depression. 6-7/10 with 10 good on depression with some problems with ambition. Plan: Continue trial risperidone for rumination 1 mg HS  04/07/2021 TC: Several phone calls: Called 2 days after the visit complaining of itching from the lithium and wanting to come off of it.  He was warned he was more emotionally distressed when he reduced the lithium in the past but he wanted to pursue it anyway.  He was continuing risperidone 1 mg nightly but wanting to reduce that also but was warned he would almost certainly have worsening mood severity and anxiety problems based on past experience.  He was cautioned against reducing lithium below 900 mg daily and risperidone below 1 mg nightly.  He agreed on 04/09/2021.  04/09/2021 he called again and several phone calls ensued thereafter.  He insisted on stopping lithium and risperidone despite warnings that his mood stability and anxiety would worsen. He was prescribed Depakote to take the place  04/20/2021 appointment with the following noted: He stopped risperidone and did not take the Depakote.  He is taking lithium 900 mg nightly and paroxetine 40 mg daily. Stopped risperidone bc feeling agitated and unstable and blamed it.  Still feels the same off of it. Says itching 85% better with less lithium.  Itching for a couple of mos.  Doesn't think anxiety noticeably worse after reducing lithium to 900 mg daily. Not dizzy.  Clumsy with hands. Anxious but not painful like it was before Paxil but tense at the end of the day.  Initially felt normal for the first time in 20 years when first started paxil but not as good now. Not sadness and hasn't had much of it. Started therapy with doctor on demand. Ruminating on "fear of not  being fully present" and worries about little things like appts pending.   Afraid of working FT because gets overwhelmed bc "I take it home with me" Plan : Continue trial risperidone but increase to rumination 1.5 mg HS Continue paroxetine 40 and lithium 600 mg   06/01/2021 appointment with the following noted: Haven't seen benefit with risperidone 1.5 mg daily. Not in pain but feels disconnected and not myself.  Has felt like this off and on for years. Level of angst all day but not real high unless triggered.  Still has the rumination. Initially a lot of benefit from paroxetine Not markedly deprssed and not sad.  07/14/21 appt noted:  Doesn't feel much different with switch to fluvoxamine except anxiety well controlled. But still low energy, motivation and not as productive as he wants to be.  Not markedly sad.  Asks about Vraylar. Not much interest or enjoyment. Don't sleep that great and don't relax that great. On fluvoxamine 200, lithium 900, off risperidone. Plan: Anxiety controlled by fluvoxamine 200 Potentiate Vraylar 1.5 mg daily  07/27/2021 phone call complaining of ongoing  depression.  Vraylar increased to 3 mg daily. 07/29/2021 phone call complaining of cost of Vraylar.  Was given samples. 08/20/2021 phone call complaining of fatigue.  Stated he stopped the Vraylar because it made him agitated.  It was recommended there be no further med changes because he is only been off the Vraylar for 1 week.  08/30/2021 appointment noted: Vraylar 1.5 mg didn't do much.  Increased to 3 mg and then felt irritable ans stopped it about 3rd week of May.  Better now. Still extremely tired and feels physically sick with normal workup. Thinks his mind wears him out.  Racing negative thoughts all the time. Asks about Ativan. Always nervous and anxious.  Trouble discerning anxiety from depression. Plan: Anxiety was controlled by fluvoxamine 200 but now he thinks it is worse. So increase 250 mg  daily  10/18/21 appt noted: No results with increase fluvoxamine.  No SE. Anxiety is still worse than depression.  Thinks it causes somatic sx like the flu.  Exhausted. GI. Poor sleep and concentration. Asks about lorazepam.   Got checked out by PCP without findings. Plan: Reduce lithium to 2 capsules daily to see if physical symptoms like fatigue etc. are better Increase fluvoxamine to 1 tablet in the morning and 2 tablets in the evening to reduce anxiety Plan Trial reduction of  lithium 900  to 600 mg daily to see if somatic sx are better like fatigue.   01/06/22 appt noted" Everything is about the same with med changes. CC anxiety and fatigue.  Sx started with severe depression in 20s and got better but never resolved. Thinks anxiety is causing fatigue.  Has to nap about 1 pm.  Easily fatigued.  No particular worry other than how he feels and himself.. Plan: Reduce fluvoxamine gradually to 150 mg daily Start clomipramine 25 mg nightly for 5 days and increase to 75 mg HS  02/07/22 TC:  Pt lvm that the clomiprame 25 mg has been taking the full dose for two weeks now. He is experiencing mania. Overspending etc    MD resp:  Increase the lithium back to 3 daily.  Stop the clomipramine.  Resume the risperidone until these symptoms clear up.  Call us back next week and let us know how it is going.     02/16/22 TC:   Pt was just calling to give you an update.He stated he is still 50 percent manic but feels better.He is currently taking   lithium carbonate 300 MG 3 daily risperiDONE 3 mg 1/2 tab daily fluvoxamine 100 MG tablet 1/2 tablet in the AM and 1 tablet at night On 11/20 changes were made,you had him stop clomipramine                     and increase lithium to 900 mg daily.  02/16/22 MD: check lithium level.  02/24/22 tC complaining of depression. MD :  He has had recent mood swings from manic to depressed by his report.  Lithium level is low at 0.5 on 600 mg daily.  I think the  most helpful thing for his mood would be to increase lithium back to 900 mg daily.      03/07/22 TC: pt reported increasing his Luvox to 200 mg daily.  03/08/22 appt noted: Multiple phone calls since here.   Clomipramine triggered mania like he's never had before.  Increased over the top looking for a house, wanting to invent things, bought a shed and fish tank abruptly.  Left his job which was a good thing.  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  I feel a lot better about it.  Has disability and will do PT work.  I'll be OK. Exhausted and can't be on his feet all the time.   Sleep is different with EMA and ready to go but watches TV awhile and then goes back to sleep.  Always a late sleeper all his life.   Still feeling some depression and he increased the luvox to 200 and it helped. Thinks parents are Autistic and don't respond emotionally.  M cannot understand feelings and he doesn't get any support.   Plan: Bc recent mania with clomipramine increase lithium 1200  mg daily .    03/29/2022 appointment urgently due to recent mania: Psych meds: fluvoxamine 200, lithium 1200, risperidone 1.5 mg HS Exhausted until the last couple of days. SE tremor Has not had any water today and is lightheaded.   Mood a little better and anxiety better than it was.  No mania. Some underlying weight in mood that only responded to paroxetine and clomipramine. Asks about Wellbutrin for energy. Sleep normal 8-9 hours now.  Except last night. Goes to [facility] groups and support group and AA. Sober Reduce risperidone to 1 mg to see if energy better  05/25/22 appt noted: Reduced lithium about 10 days ago bc thought he might be getting too much bc read on the internet.  No changes since reducing it. More dep for a couple of mos which is unusual.  In bed a lot and more isolated and not normally what I have.  Not real sad but unmotivated and no energy to do things.    Start therapist Friday. No sig sadness.   Irritable and short with people over little things.  Kind of started after stopped drinking.  Worse.  Sleep on and off.  Erratic.  Looks for ward to night so can escape into sleep.   Anxious underlying for 30 years.  Hard to sit and relax and just watch TV.  Tends to ruminate. Asks to try Gaba. Now lethargy is worse than chronic anxiety Reducing risperidone made no changes good or bad. DC risperidone 1 mg HS Add Latuda 1/2 tablet for 1 week then 1 tablet in the the evening  07/06/22 appt :  Increased Latuda up to 100 mg .  No clear SE. Exhausted mentally and it makes his body tired.  Working 10-2 and then comes home and lays in bed a couple of hours.  Not a deep sleep.  Never feels fully rested.  Sleep HS 10-7.   Normal appetitie.   Still some sadness and irritability.  Anxiety doesn't seem better but is having less mood swings and mania.  Anxiety wears him down.   Plan: Increase  Latuda 120 mg in the evening with food  07/21/22 appt noted: Trouble keeping mood chart.  If I talk and I'm heard then feels better for hours but then damn stopped up.  Almost like an obsessive thing to talk for relief. No clear effect of increase Latuda except reduced som cognitive anxiety but not physical anxiety. Says there was med he took before paxil that gave him instant relief and wonders about retrying it.  Doesn't know which med. Paxil had best effect when it worked for a period of time. Exhausted but fidget when sits.   Dep and anxious moderate.  09/01/22 appt noted: Meds clonazepam 0.5 mg BID prn, fluvoxamine  200 mg daily, lithium 900 HS, latuda 120 mg daily, vit D3 This type of dep since November after mania is low energy, anhedonia, low appetite, a lot of time in bed, "textbook" depresssion, lack of interest but not debilitating sadness.   Tired of it.  Dep is worse than anxiety and used to be the other way around.  Not as irritable. In past dep was anxious and disconnected and wasn't as tired.  Had to  leave work bc can't get himself out of bed.   Toss and turn in bed fidgety in bed, feels like has to move but no energy to walk.  Fidgety for 3 mos. Clonazepam takes the edge off but feels a higher dose is needed.    On disability for 7-8 years.  Working PT [employer] for alcohol.  Sober 2 year 01/2019  Past psych meds:  Paxil 60 "too much" + wt gain,   duloxetine, Zoloft,  Viibryd 60 partial resp (trial after paroxetine) SE diarrhea. Trintellix 10 agitated Wellbutrin 75 (max tolerated),   Fluvoxamine 300 no better than 200 which seems to take edge off anxiety Clomipramine mania  Abilify, Rexulti 2. Vraylar irritable at 3 mg daily.  Latuda 120 fidgety  Buspar 30 BID stopped DT partial effect.  Xanax craving,   Olanzapine 10 fatigue Risperdal 2 once Latuda 120 NR dep likely akathisia  lithium  900, worse when he stopped lithium Lamotrigine NR CO dizzy  NAC 600 helped Modafinil 200 agitated Took lorazepam  in the past, Xanax less helpful  B severe OCD obsessions psych tx.  Review of Systems:  Review of Systems  Constitutional:  Positive for fatigue. Negative for unexpected weight change.  Cardiovascular:  Negative for chest pain and palpitations.  Gastrointestinal:  Positive for constipation. Negative for nausea.  Genitourinary:  Negative for urgency.  Neurological:  Negative for tremors.  Psychiatric/Behavioral:  Positive for dysphoric mood. Negative for agitation, behavioral problems, confusion, decreased concentration, hallucinations, self-injury, sleep disturbance and suicidal ideas. The patient is nervous/anxious. The patient is not hyperactive.     Medications: I have reviewed the patient's current medications.  Current Outpatient Medications  Medication Sig Dispense Refill   amLODipine (NORVASC) 5 MG tablet TAKE 1 TABLET BY MOUTH EVERY DAY 90 tablet 3   atorvastatin (LIPITOR) 40 MG tablet TAKE 1 TABLET(40 MG) BY MOUTH DAILY 90 tablet 1   clonazePAM  (KLONOPIN) 0.5 MG tablet TAKE 1 TABLET(0.5 MG) BY MOUTH TWICE DAILY AS NEEDED FOR ANXIETY 30 tablet 0   fluvoxaMINE (LUVOX) 100 MG tablet TAKE 2 TABLETS(200 MG) BY MOUTH DAILY 120 tablet 0   lithium carbonate 300 MG capsule TAKE 4 CAPSULES(1200 MG) BY MOUTH AT BEDTIME (Patient taking differently: Take 900 mg by mouth at bedtime.) 360 capsule 0   Lurasidone HCl (LATUDA) 120 MG TABS Take 1 tablet (120 mg total) by mouth daily after supper. 30 tablet 1   Lurasidone HCl 60 MG TABS Take 1 tablet (60 mg total) by mouth every evening. 30 tablet 0   No current facility-administered medications for this visit.    Medication Side Effects: sexual, weight  Allergies: No Known Allergies  Past Medical History:  Diagnosis Date   Anxiety    Bimalleolar fracture of left ankle    Depression    Heart murmur    Sleep apnea    uses CPAP nightly    Family History  Problem Relation Age of Onset   Cancer Maternal Grandfather    Colon cancer Neg Hx  Colon polyps Neg Hx    Esophageal cancer Neg Hx    Rectal cancer Neg Hx    Stomach cancer Neg Hx     Social History   Socioeconomic History   Marital status: Single    Spouse name: Not on file   Number of children: Not on file   Years of education: Not on file   Highest education level: Associate degree: occupational, Scientist, product/process development, or vocational program  Occupational History   Not on file  Tobacco Use   Smoking status: Every Day    Packs/day: 1.50    Years: 0.00    Additional pack years: 0.00    Total pack years: 0.00    Types: Cigarettes    Start date: 03/22/1975   Smokeless tobacco: Never   Tobacco comments:    He reports he has quit 3 times and recently started back in 2012. Hsm   Vaping Use   Vaping Use: Never used  Substance and Sexual Activity   Alcohol use: Not Currently   Drug use: No   Sexual activity: Not on file  Other Topics Concern   Not on file  Social History Narrative   Not on file   Social Determinants of Health    Financial Resource Strain: Low Risk  (07/21/2022)   Overall Financial Resource Strain (CARDIA)    Difficulty of Paying Living Expenses: Not hard at all  Food Insecurity: No Food Insecurity (07/21/2022)   Hunger Vital Sign    Worried About Running Out of Food in the Last Year: Never true    Ran Out of Food in the Last Year: Never true  Transportation Needs: No Transportation Needs (07/21/2022)   PRAPARE - Administrator, Civil Service (Medical): No    Lack of Transportation (Non-Medical): No  Physical Activity: Unknown (07/21/2022)   Exercise Vital Sign    Days of Exercise per Week: 0 days    Minutes of Exercise per Session: Not on file  Recent Concern: Physical Activity - Inactive (07/21/2022)   Exercise Vital Sign    Days of Exercise per Week: 0 days    Minutes of Exercise per Session: 0 min  Stress: Stress Concern Present (07/21/2022)   [employer] of Occupational Health - Occupational Stress Questionnaire    Feeling of Stress : Very much  Social Connections: Socially Isolated (07/21/2022)   Social Connection and Isolation Panel [NHANES]    Frequency of Communication with Friends and Family: Three times a week    Frequency of Social Gatherings with Friends and Family: Twice a week    Attends Religious Services: Never    Database administrator or Organizations: No    Attends Engineer, structural: Not on file    Marital Status: Never married  Intimate Partner Violence: Not At Risk (07/29/2021)   Humiliation, Afraid, Rape, and Kick questionnaire    Fear of Current or Ex-Partner: No    Emotionally Abused: No    Physically Abused: No    Sexually Abused: No    Past Medical History, Surgical history, Social history, and Family history were reviewed and updated as appropriate.   Please see review of systems for further details on the patient's review from today.   Objective:   Physical Exam:  There were no vitals taken for this visit.  Physical  Exam Constitutional:      General: He is not in acute distress.    Appearance: Normal appearance. He is well-developed.     Comments:  Red face  Musculoskeletal:        General: No deformity.  Neurological:     Mental Status: He is alert and oriented to person, place, and time.     Motor: No tremor.     Coordination: Coordination normal.     Gait: Gait normal.  Psychiatric:        Attention and Perception: Attention normal. He is attentive.        Mood and Affect: Mood is anxious and depressed. Affect is not labile, blunt, angry or inappropriate.        Speech: Speech normal. Speech is not slurred.        Behavior: Behavior normal. Behavior is not agitated or slowed.        Thought Content: Thought content normal. Thought content is not delusional. Thought content does not include homicidal or suicidal ideation. Thought content does not include suicidal plan.        Cognition and Memory: Cognition normal.        Judgment: Judgment normal.     Comments: Insight  Fair. Continued mood problems without much mania but irritable and anxious      Lab Review:     Component Value Date/Time   NA 141 03/01/2022 0904   K 4.0 03/01/2022 0904   CL 107 03/01/2022 0904   CO2 27 03/01/2022 0904   GLUCOSE 81 03/01/2022 0904   BUN 18 03/01/2022 0904   CREATININE 1.01 03/01/2022 0904   CALCIUM 9.6 03/01/2022 0904   PROT 7.6 06/17/2021 1350   ALBUMIN 4.7 06/17/2021 1350   AST 19 06/17/2021 1350   ALT 23 06/17/2021 1350   ALKPHOS 108 06/17/2021 1350   BILITOT 0.5 06/17/2021 1350       Component Value Date/Time   WBC 9.5 07/21/2022 1143   RBC 5.10 07/21/2022 1143   HGB 15.9 07/21/2022 1143   HGB 15.2 05/05/2010 1049   HCT 45.7 07/21/2022 1143   HCT 44.8 05/05/2010 1049   PLT 218.0 07/21/2022 1143   PLT 193 05/05/2010 1049   MCV 89.7 07/21/2022 1143   MCV 89.6 05/05/2010 1049   MCH 30.4 05/05/2010 1049   MCHC 34.8 07/21/2022 1143   RDW 13.4 07/21/2022 1143   RDW 13.1 05/05/2010  1049   LYMPHSABS 2.5 07/21/2022 1143   LYMPHSABS 1.8 05/05/2010 1049   MONOABS 0.8 07/21/2022 1143   MONOABS 0.4 05/05/2010 1049   EOSABS 0.6 07/21/2022 1143   EOSABS 0.2 05/05/2010 1049   BASOSABS 0.1 07/21/2022 1143   BASOSABS 0.0 05/05/2010 1049    Lithium Lvl  Date Value Ref Range Status  04/01/2022 0.9 0.6 - 1.2 mmol/L Final  04/01/22 lithium level 0.9 on 1200 mg daily  02/02/21 lithium level 0.6 on 900 mg daily  No results found for: "PHENYTOIN", "PHENOBARB", "VALPROATE", "CBMZ"   .res Assessment: Plan:     Wilber was seen today for follow-up, depression and anxiety.  Diagnoses and all orders for this visit:  Bipolar II disorder (HCC) -     Lurasidone HCl 60 MG TABS; Take 1 tablet (60 mg total) by mouth every evening.  GAD (generalized anxiety disorder)  Mixed obsessional thoughts and acts  Antipsychotic-induced akathisia  OSA (obstructive sleep apnea)  Low vitamin D level  Lithium use  Low serum vitamin B12  Alcohol dependence in remission (HCC)   Chronic recurrent rumination with anxiety and urgency and sense of need and instablity with impatience ongoing with frequent phone calls between appts. but  but anhedonic depression  and anxious.  Needs a lot of reassurance. Chronic worry and overwhelmed easily and misattributes sx as SE of meds leading to inadequate med trials.  Fear of meds.Falsely attributes his psych sx to the meds and then stops meds AMA.  False attribution of sx as SE. Discussed the use of a mood chart which may help address some of this part attribution and better delineate mood cycling.  Gave him a copy of the chart reviewed in detail with him as to how to keep it.  Anhedonic dep and anxiety and akathisia problems todayu  Anxiety was better but not gone on fluvoxamine 200 . No change   he describe history other manic cycling symptoms including increased interest, increased goal-directed behaviors, increased urge to spend that will last for 6  weeks or so and then stop.  He has these cycles about 3 times a year.  This is suggestive of a bipolar predisposition which was confirmed by recent cycle into mania from clomipramine.  Continue CPAP use. New CPAP machine.  Discussed potential metabolic side effects associated with atypical antipsychotics, as well as potential risk for movement side effects. Advised pt to contact office if movement side effects occur.  He agrees.  Call if you have any problems with this.  He remains sober.  He wants to consider counseling to work through some personal issues as well as address specific symptoms like his anger and irritability. Rec local AA instead of online. clonazepam DT TR status 0.5 mg BIDprn No evidence of abuse.  Bc late 2023 mania with clomipramine increased lithium 1200  mg daily .   He cut it back to 900 mg daily.  He admits to feeling worse when he stopped the lithium.  Call if sx get worse. Counseled patient regarding potential benefits, risks, and side effects of lithium to include potential risk of lithium affecting thyroid and renal function.  Discussed need for periodic lab monitoring to determine drug level and to assess for potential adverse effects.  Counseled patient regarding signs and symptoms of lithium toxicity and advised that they notify office immediately or seek urgent medical attention if experiencing these signs and symptoms.  Patient advised to contact office with any questions or concerns. 02/02/21  02/02/21 lithium level 0.6 on 900 mg daily, normal B12 and vitamin D 52 04/01/22 lithium level 0.9 on 1200 mg daily  02/02/21 lithium level 0.6 on 900 mg daily  Disc the off-label use of N-Acetylcysteine at 600 mg daily to help with mild cognitive problems.  It can be combined with a B-complex vitamin as the B-12 and folate have been shown to sometimes enhance the effect.  Partial benefit so continue NAC 1200 mg daily.  Reduce  Latuda from 120 to 60 mg in the evening with  food.  DC other doses of Latuda DT akathisia  Reads on internet about meds.  Disc this. Needs a lot of reassurance. Reviewed mood chart BEAM in detail. Keep it.  Constipation management 1.  Lots of water 2.  Powdered fiber supplement such as MiraLAX, Citrucel, etc. preferably with a meal 3.  2 stool softeners a day 4.  Milk of magnesia or magnesium tablets if needed  Continue vitamin D DT history level 30  Please see After Visit Summary for patient specific instructions. Changes: Reduce Latuda to 60 mg daily. Start samples of [place] 1 in the AM for dep that is TRD  FU 8 weeks  Meredith Staggers, MD, DFAPA     Future Appointments  Date Time Provider Department Center  10/13/2022 10:30 AM Cottle, Steva Ready., MD [facility] None  11/14/2022 10:30 AM Cottle, Steva Ready., MD [facility] None    No orders of the defined types were placed in this encounter.       -------------------------------

## 2022-09-16 ENCOUNTER — Other Ambulatory Visit: Payer: Self-pay | Admitting: Psychiatry

## 2022-09-21 ENCOUNTER — Other Ambulatory Visit: Payer: Self-pay | Admitting: Adult Health

## 2022-09-23 ENCOUNTER — Other Ambulatory Visit: Payer: Self-pay | Admitting: Psychiatry

## 2022-09-23 DIAGNOSIS — F3181 Bipolar II disorder: Secondary | ICD-10-CM

## 2022-09-30 ENCOUNTER — Telehealth: Payer: Self-pay | Admitting: Psychiatry

## 2022-09-30 MED ORDER — AUVELITY 45-105 MG PO TBCR: 1.0000 | EXTENDED_RELEASE_TABLET | Freq: Two times a day (BID) | ORAL | 1 refills | Status: DC

## 2022-09-30 NOTE — Telephone Encounter (Signed)
Next visit is 10/13/22. Mylon was given samples here for [facility] but now has given out of them. Can he have a prescription called in to:  [id number] [place], [place] - [address] AT [facility] [address]    Phone: [phone]  Fax: [phone]

## 2022-09-30 NOTE — Telephone Encounter (Signed)
Rx sent to [facility]. Patient has one pill left. Will pick up samples on Monday. He is aware it will likely require PA.

## 2022-10-03 ENCOUNTER — Other Ambulatory Visit: Payer: Self-pay

## 2022-10-03 MED ORDER — AUVELITY 45-105 MG PO TBCR: 1.0000 | EXTENDED_RELEASE_TABLET | Freq: Two times a day (BID) | ORAL | 1 refills | Status: DC

## 2022-10-13 ENCOUNTER — Ambulatory Visit: Payer: Medicare Other | Admitting: Psychiatry

## 2022-10-20 ENCOUNTER — Ambulatory Visit: Payer: Medicare Other | Admitting: Psychiatry

## 2022-10-20 ENCOUNTER — Encounter: Payer: Self-pay | Admitting: Psychiatry

## 2022-10-20 DIAGNOSIS — F3181 Bipolar II disorder: Secondary | ICD-10-CM | POA: Diagnosis not present

## 2022-10-20 DIAGNOSIS — F422 Mixed obsessional thoughts and acts: Secondary | ICD-10-CM

## 2022-10-20 DIAGNOSIS — G2571 Drug induced akathisia: Secondary | ICD-10-CM | POA: Diagnosis not present

## 2022-10-20 DIAGNOSIS — R7989 Other specified abnormal findings of blood chemistry: Secondary | ICD-10-CM

## 2022-10-20 DIAGNOSIS — F411 Generalized anxiety disorder: Secondary | ICD-10-CM | POA: Diagnosis not present

## 2022-10-20 DIAGNOSIS — F1021 Alcohol dependence, in remission: Secondary | ICD-10-CM

## 2022-10-20 DIAGNOSIS — G4733 Obstructive sleep apnea (adult) (pediatric): Secondary | ICD-10-CM

## 2022-10-20 DIAGNOSIS — E538 Deficiency of other specified B group vitamins: Secondary | ICD-10-CM

## 2022-10-20 DIAGNOSIS — Z79899 Other long term (current) drug therapy: Secondary | ICD-10-CM

## 2022-10-20 MED ORDER — FLUVOXAMINE MALEATE 100 MG PO TABS
150.0000 mg | ORAL_TABLET | Freq: Every day | ORAL | 0 refills | Status: DC
Start: 2022-10-20 — End: 2023-02-08

## 2022-10-20 MED ORDER — LURASIDONE HCL 40 MG PO TABS
40.0000 mg | ORAL_TABLET | Freq: Every day | ORAL | 1 refills | Status: DC
Start: 2022-10-20 — End: 2022-11-14

## 2022-10-20 NOTE — Progress Notes (Signed)
Jonathon Walker [id number] 1965/09/09 57 y.o.   Subjective:   Patient ID:  Jonathon Walker is a 57 y.o. (DOB [birth year]) male.  Chief Complaint:  Chief Complaint  Patient presents with   Follow-up   Depression   Anxiety    Depression        Associated symptoms include fatigue.  Associated symptoms include no decreased concentration and no suicidal ideas.  Past medical history includes anxiety.   Anxiety Symptoms include nervous/anxious behavior. Patient reports no chest pain, confusion, decreased concentration, nausea, palpitations or suicidal ideas.       Jonathon Walker presents to the office today for follow-up of anxiety , depression, alcohol.    seen August 7.  His anxiety was unmanaged at Paxil 40 mg and olanzapine 5 mg so olanzapine was increased to 7.5 daily.  seen December 17, 2018.  His anxiety was unmanaged.  The following change was made: Trial 1-1/2 of the 7.5 mg tablets of olanzapine for anxiety.  He is had a stay at [facility] for alcohol dependence since he was last here. Stayed 28 days and DC before Thanksgiving.  Very helpful.  Was having NM nighly before and they stopped since then.  Not drinking.  Involved in AA since then.  seen March 25, 2019.  The patient was doing better requested a reduction in olanzapine to 7.5 mg nightly to improve energy.  visit April 03, 2019.  He had remained sober and his anxiety was improved with sobriety.  He was having problems with low motivation and forgetfulness.  At the next refill it was decided olanzapine would be reduced from 7.5 to 5 mg daily. He called requesting this urgent appointment because of worsening symptoms associated with returning to work.  seen May 21, 2019.  He was doing relatively well.  The following was noted: Tolerating Wellbutrin and it seems to help. He didn't tolerate the increase. Anxiety is improved off alcohol.  Ok to reduce olanzapine to 2.5 mg daily for a month and stop it.  Hopefully  low motivation will improve.   He called back March 23 stating he was more depressed and having a harder time doing routine tasks and having suicidal thoughts.  Because of worsening depression after reducing the olanzapine he was encouraged to increase olanzapine back to 5 mg nightly. He called again March 24 stating he was really struggling but was scheduled for an appointment on March 26.  As of June 14, 2019 he reports the following: Every day a real hard struggle to get through the days.  More anxious and depressed equally. Worse 2 weeks.  Last Saturday so overwhelmed by how he feels he had SI.  Everything is hard. Initial benefit paroxetine has been lost.  Awakens stressed and tired. Adequate hours of sleep.  Will be major chore just to mow grass.  Still sober. SE sexual. Getting appt with CPAP doc. Changes made include: Increasing olanzapine back to 5 mg daily a couple of days prior to the appointment due to phone call and the addition of lithium 300 mg for a few days then increase to 600 mg nightly both to augment the antidepressant and because of suicidal thoughts.  July 15, 2019 appointment, the following is noted: Doing OK with both depression and anxiety.  Kind of like a top always going in mind.  Even when I sleep, when awakens mind still going.  NM nightly for years but less than before stopped drinking.  Smaller and less severe ones  that don't wake him.  Theme can't finish things.  Both depression and anxiety 3/10.  More productive.  Paces himself.  Can live with it.  Sleep 9 hours.  Initially olanzapine stopped the ruminating but it's back some.  Overall still benefit.  No SI and can't rmember why he had them before. Residual energy not great and some ruminating. He had some concerns about sexual side effects from Paxil but agreed that no med changes were necessary despite residual symptoms of depression and anxiety.  08/14/2019 appointment, the following is noted:  He scheduled  urgently. Vaccinated. Angry easily and exhausted and everything is a chore.  Exhausted. Never had appt with CPAP company.  CPAP is 58 years old and on same settings as in the past.  CPAP machine is not working properly but when it did it was helpful for alertness and energy.  Disc need to call them to have it evaluated. To bed 10-7.  Don't feel rested in morning but used to feel rested when first started paroxetine. Plan: Increase Lithium continue to 900 mg daily for irritability  11/19/19 appt with the following noted: He increased to 900 mg and then reduced it but doesn't know why.  Reduced it to 300 nightly. Irritability much better.  Once anger since here with rage but not outward. Still on paroxetine 40, olanzapine 5, Wellbutrin 75 AM. Getting tired in afternoon and worrying about getting depressed but he's not depressed now.  Don't enjoy things like he used to.  Les interest and excitement. Appetite and sleep are normal.   Still adjusting without alcohol and socialization. Worrying about lack of sex drive with paroxetine. Can still ruminate on things until he gets some reassurance through talking with people. Plan: Continue low-dose lithium 300 mg daily Continue Paxil.  Did not feel well at 60 mg so we will continue 40 mg.  Likely to lose anxiety benefit if change it. Continue low-dose Wellbutrin 75  Tolerating Wellbutrin and it seems to help. He didn't tolerate the increase.   Continue olanzapine 5 mg  02/18/2020 appointment with following noted: Open to trying increase paroxetine again bc ruminating wears him out and gets fatigued and it wears him out though is 80% better vs before paroxetine. Doesn't remember trying higher paroxetine.  Ruminates on relationships or work.  Whenever he can talk about it with someone it helps tremendously but also realizes paroxetine helps. Anxiety affects dreams and NM too.  Up and down a little.  Anxiety drove depression before paroxetine.  Best med he's  tried. SOBER 1 YEAR SE no problem.  Except gained 25# in 4 years.  No change in diet and exercise.  No increase in appetite and no food cravings.  Seeing PCP soon. Plan: Increase paroxetine trial to 60 for rumination.  Disc SE.  05/26/20 appt noted: No benefit increase paroxetine nor SE.  Wants to reduce back to 30 mg paroxetine and stop lithium bc it didn't seem to help.   4 days of Calm app has seemed to help rumination.  Does it in afternoon and before bed and it seemed to helps.  Doing a 10 min meditation and it helps.   Dep 4/10.  Anxiety 4/10. And a lot better the last few days.   Plan: Reduce lithium by 1 tablet per week.  Call if there is any suicidal thought. Starting April 1 reduce paroxetine to 40 mg daily. Continue low-dose Wellbutrin 75  Tolerating Wellbutrin and it seems to help. He didn't tolerate the  increase.   If doing OK will try taper as he suggested at next visit. Continue olanzapine 5 mg nightly it was helpful for depression and anxiety.  07/01/2020 appt with following noted: Moved appt up. Now says he understated depression level when he was here the last time. Restarted lithium bc felt more depressed without it. Reduced paroxetine to 40 mg daily. Lethargic.  Mind won't calm down.  Nonrestorative sleep but 8 hours.  Doesn't think sleep is deep enough.  Tense. Goes to [employer]. Started counseling. Wants to try something different with meds. Can have mixed sx for 6 weeks with increased interest in things and motivation and want to spend money but still feels depressed.  3 times in the last year. Plan: Increase  lithium back to 3 daily to see if depression is better and less mood cycling.  He admits to feeling worse when he stopped the lithium Continue the reduce paroxetine to 45 mg daily. DC Wellbutrin Lamotrigine trial 25 mg to 100 mg daily over 4 weeks. Continue olanzapine 5 mg nightly it was helpful for depression and anxiety.  08/26/2020 appointment with  the following noted: Sad and down.  Classic depression sx and very fatigued.  Unusual to have classic depression bc usually is atypical.  No effect from lamotrigine. Wonders if atorvastatin is causing some confusion or depression bc read about it. Depressed for 3 mos. Gained 20#.  It's leveled off now. Olanzapine helped the rumination and anxiety.   Life is more depressing bc not seeing friends like it was bc stopped drinking.   No SE with lithium.  Unless dropping things. Sleep 1030 to 6 but doesn't sleep deep.  Not rested in AM Plan: Increase  lithium back to 3 daily to see if depression is better and less mood cycling.  He admits to feeling worse when he stopped the lithium Continue the reduce paroxetine to 40 mg daily bc it helped anxiety. Lamotrigine increase to 150 mg daily DT no benefit or SE Increase olanzapine10 mg nightly it was helpful for depression and anxiety.  Need more help with depression   09/11/2020 phone call: Pt stated he is foegetting things throughout the day and very unsteady.He works with tools and equptment and this is a problem.He stated he takes 6 Lamictal in the am. MD response: We made 3 med changes at the last visit including increasing lamotrigine to 150 mg daily, increasing olanzapine to 10 mg daily, and restarting lithium.  Any 1 of those changes could possibly cause some of the side effects.  We will have to make 1 change at a time to evaluate this.  Therefore reduce lamotrigine to 100 mg daily.  It may take a couple of weeks before he sees a difference.  11/13/2020 phone call: Patient lm stating he is currently taking Lamotrigine 150 mg. He has decreased it to 75 mg due to side effects per pt ( blurred vision, shaking and coordination issues). Patient stated he is discontinuing before his scheduled October appointment. MD response: Take the lamotrigine 75 mg for 2 weeks and he should be able to stop then without withdrawal.  If he gets shakey, nervous then we need  to reduce more slowly and let us know that.  Otherwise he can stop if he follows these directions.  11/30/2020 phone call: Pt called and advised he would like to taper off of the Olanzapine.  He has gained 8# since his last visit and 30# total since he started taking the med.  He feels steady, not so much stress now, and would like to see if he can take Lorazepam instead.  Then talk to Dr. Jennelle Human in Oct when they meet. MD response: Reduce olanzapine to one half of the 10 mg tablet at night for 4 weeks and then stop it.  If he gets more depressed then get back in touch with [place] and we will try to do something about the weight gain that olanzapine can cause by using the alternative Lybalvi  12/07/2020 appointment with the following noted: Off lamotrigine .  Reduced olanzapine to 5 mg for a week.  On lithium 900 mg HS, paroxetine 40 Fatigue 10/10.  Dizziness resolved off lamotrigine.  Low fatigue and motivation.  "I want to want to do things".  Stopped drinking almost 18 mos and socially big adjustment.   Still invloved with AA and daily sponsor.  Working [employer].  Reece Levy Young helped. Thinks mild depression with little interest and motivation and no joy.  But not severe.  No SI. No anxiety and it's way better.  Life is more stable and helps.  Working 4 hours daily and occ extrea jobs. Uses new CPAP.  Stil drowsy and fatigue. Plan: Continue CPAP use. New CPAP machine. Trial modafinil 100-200 mg AM Lamotrigine off DT NR Reduced olanzapine to 5 mg daily for a week and well stop in 3 weeks DT wt gain and fatigue  01/20/2021 phone call: I called patient to see if I could get better information. He states he started modafinil a couple of weeks ago, though Rx was written for in September. He states he didn't feel well before taking the modafinil though. He just says he is agitated, irritable, and unstable feeling. When asked what he meant by unstable he stated feeling a "shell of myself".  He has been put on the  cancellation list.  MD response: It is likely the modafinil that is causing this problem.  But it may be corrected with a dose reduction.  I assume he is taking a whole tablet.  If that is the case time to reduce to 1/2 tablet in the morning.  If he is taking half a tablet tell him to reduce it to a quarter of a tablet.  And I agree we need to try to get him in as soon as possible. He reduced modafinil from 200 mg to 100 mg daily.  02/10/2021 appointment with the following noted: Current psych meds lithium 900 mg nightly, paroxetine 40 mg daily, olanzapine recently stopped, modafinil recently tried. Was really bad for awhile feeling depresssed and agitated and anxiety but better now including after reducing modafinil to 100 mg daily. Fatigue is better and in the middle now with energy, not good but not bad. Sleep is better.  CPAP doctor yesterday and they are gathering info pending. NAC is helping with memory. Still sober and committed to it. SE constipation  Plan: No med changes  03/10/2021 phone call from patient complaining of vague feelings of not feeling well and wanting the Paxil changed.  He was placed on the cancellation list.  03/18/2021 appointment urgently scheduled at his request: Remains on paroxetine 40 mg daily, lithium 1200 mg daily, modafinil 200 mg every morning. "Ruminating"  with sense of need to talk to someone.  Then feels better for awhile.  Mostly on relationships with family.  No one to talk to. Wonders need to chang ethe meds.   Don't socialize anymore since sober.  Does online AA everyday at  noon. Lost 10# off olanzapine. Plan: Continue paroxetine 40 mg, lithium 1200 mg daily, Try stopping modafinil 100mg  AM to determine if it is helping or not. If not less anxious then start risperidone for rumination 1-2 mg HS  04/05/2021 appointment with the following noted: Stopped  modafinil and it helped reduce anxiety. Started risperidone 2 instead of 1 mg and was off  balance so reduced risperidone to 1 mg HS. Only done this for 3 nights.  No SE with it. Anxiety is not close to as bad as originally.  Can be painful but not now. Still ruminating some "my whole adult life" but varies.  Anxiety can lead to depression. 6-7/10 with 10 good on depression with some problems with ambition. Plan: Continue trial risperidone for rumination 1 mg HS  04/07/2021 TC: Several phone calls: Called 2 days after the visit complaining of itching from the lithium and wanting to come off of it.  He was warned he was more emotionally distressed when he reduced the lithium in the past but he wanted to pursue it anyway.  He was continuing risperidone 1 mg nightly but wanting to reduce that also but was warned he would almost certainly have worsening mood severity and anxiety problems based on past experience.  He was cautioned against reducing lithium below 900 mg daily and risperidone below 1 mg nightly.  He agreed on 04/09/2021.  04/09/2021 he called again and several phone calls ensued thereafter.  He insisted on stopping lithium and risperidone despite warnings that his mood stability and anxiety would worsen. He was prescribed Depakote to take the place  04/20/2021 appointment with the following noted: He stopped risperidone and did not take the Depakote.  He is taking lithium 900 mg nightly and paroxetine 40 mg daily. Stopped risperidone bc feeling agitated and unstable and blamed it.  Still feels the same off of it. Says itching 85% better with less lithium.  Itching for a couple of mos.  Doesn't think anxiety noticeably worse after reducing lithium to 900 mg daily. Not dizzy.  Clumsy with hands. Anxious but not painful like it was before Paxil but tense at the end of the day.  Initially felt normal for the first time in 20 years when first started paxil but not as good now. Not sadness and hasn't had much of it. Started therapy with doctor on demand. Ruminating on "fear of not  being fully present" and worries about little things like appts pending.   Afraid of working FT because gets overwhelmed bc "I take it home with me" Plan : Continue trial risperidone but increase to rumination 1.5 mg HS Continue paroxetine 40 and lithium 600 mg   06/01/2021 appointment with the following noted: Haven't seen benefit with risperidone 1.5 mg daily. Not in pain but feels disconnected and not myself.  Has felt like this off and on for years. Level of angst all day but not real high unless triggered.  Still has the rumination. Initially a lot of benefit from paroxetine Not markedly deprssed and not sad.  07/14/21 appt noted:  Doesn't feel much different with switch to fluvoxamine except anxiety well controlled. But still low energy, motivation and not as productive as he wants to be.  Not markedly sad.  Asks about Vraylar. Not much interest or enjoyment. Don't sleep that great and don't relax that great. On fluvoxamine 200, lithium 900, off risperidone. Plan: Anxiety controlled by fluvoxamine 200 Potentiate Vraylar 1.5 mg daily  07/27/2021 phone call complaining of ongoing  depression.  Vraylar increased to 3 mg daily. 07/29/2021 phone call complaining of cost of Vraylar.  Was given samples. 08/20/2021 phone call complaining of fatigue.  Stated he stopped the Vraylar because it made him agitated.  It was recommended there be no further med changes because he is only been off the Vraylar for 1 week.  08/30/2021 appointment noted: Vraylar 1.5 mg didn't do much.  Increased to 3 mg and then felt irritable ans stopped it about 3rd week of May.  Better now. Still extremely tired and feels physically sick with normal workup. Thinks his mind wears him out.  Racing negative thoughts all the time. Asks about Ativan. Always nervous and anxious.  Trouble discerning anxiety from depression. Plan: Anxiety was controlled by fluvoxamine 200 but now he thinks it is worse. So increase 250 mg  daily  10/18/21 appt noted: No results with increase fluvoxamine.  No SE. Anxiety is still worse than depression.  Thinks it causes somatic sx like the flu.  Exhausted. GI. Poor sleep and concentration. Asks about lorazepam.   Got checked out by PCP without findings. Plan: Reduce lithium to 2 capsules daily to see if physical symptoms like fatigue etc. are better Increase fluvoxamine to 1 tablet in the morning and 2 tablets in the evening to reduce anxiety Plan Trial reduction of  lithium 900  to 600 mg daily to see if somatic sx are better like fatigue.   01/06/22 appt noted" Everything is about the same with med changes. CC anxiety and fatigue.  Sx started with severe depression in 20s and got better but never resolved. Thinks anxiety is causing fatigue.  Has to nap about 1 pm.  Easily fatigued.  No particular worry other than how he feels and himself.. Plan: Reduce fluvoxamine gradually to 150 mg daily Start clomipramine 25 mg nightly for 5 days and increase to 75 mg HS  02/07/22 TC:  Pt lvm that the clomiprame 25 mg has been taking the full dose for two weeks now. He is experiencing mania. Overspending etc    MD resp:  Increase the lithium back to 3 daily.  Stop the clomipramine.  Resume the risperidone until these symptoms clear up.  Call us back next week and let us know how it is going.     02/16/22 TC:   Pt was just calling to give you an update.He stated he is still 50 percent manic but feels better.He is currently taking   lithium carbonate 300 MG 3 daily risperiDONE 3 mg 1/2 tab daily fluvoxamine 100 MG tablet 1/2 tablet in the AM and 1 tablet at night On 11/20 changes were made,you had him stop clomipramine                     and increase lithium to 900 mg daily.  02/16/22 MD: check lithium level.  02/24/22 tC complaining of depression. MD :  He has had recent mood swings from manic to depressed by his report.  Lithium level is low at 0.5 on 600 mg daily.  I think the  most helpful thing for his mood would be to increase lithium back to 900 mg daily.      03/07/22 TC: pt reported increasing his Luvox to 200 mg daily.  03/08/22 appt noted: Multiple phone calls since here.   Clomipramine triggered mania like he's never had before.  Increased over the top looking for a house, wanting to invent things, bought a shed and fish tank abruptly.  Left his job which was a good thing.  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  I feel a lot better about it.  Has disability and will do PT work.  I'll be OK. Exhausted and can't be on his feet all the time.   Sleep is different with EMA and ready to go but watches TV awhile and then goes back to sleep.  Always a late sleeper all his life.   Still feeling some depression and he increased the luvox to 200 and it helped. Thinks parents are Autistic and don't respond emotionally.  M cannot understand feelings and he doesn't get any support.   Plan: Bc recent mania with clomipramine increase lithium 1200  mg daily .    03/29/2022 appointment urgently due to recent mania: Psych meds: fluvoxamine 200, lithium 1200, risperidone 1.5 mg HS Exhausted until the last couple of days. SE tremor Has not had any water today and is lightheaded.   Mood a little better and anxiety better than it was.  No mania. Some underlying weight in mood that only responded to paroxetine and clomipramine. Asks about Wellbutrin for energy. Sleep normal 8-9 hours now.  Except last night. Goes to [facility] groups and support group and AA. Sober Reduce risperidone to 1 mg to see if energy better  05/25/22 appt noted: Reduced lithium about 10 days ago bc thought he might be getting too much bc read on the internet.  No changes since reducing it. More dep for a couple of mos which is unusual.  In bed a lot and more isolated and not normally what I have.  Not real sad but unmotivated and no energy to do things.    Start therapist Friday. No sig sadness.   Irritable and short with people over little things.  Kind of started after stopped drinking.  Worse.  Sleep on and off.  Erratic.  Looks for ward to night so can escape into sleep.   Anxious underlying for 30 years.  Hard to sit and relax and just watch TV.  Tends to ruminate. Asks to try Gaba. Now lethargy is worse than chronic anxiety Reducing risperidone made no changes good or bad. DC risperidone 1 mg HS Add Latuda 1/2 tablet for 1 week then 1 tablet in the the evening  07/06/22 appt :  Increased Latuda up to 100 mg .  No clear SE. Exhausted mentally and it makes his body tired.  Working 10-2 and then comes home and lays in bed a couple of hours.  Not a deep sleep.  Never feels fully rested.  Sleep HS 10-7.   Normal appetitie.   Still some sadness and irritability.  Anxiety doesn't seem better but is having less mood swings and mania.  Anxiety wears him down.   Plan: Increase  Latuda 120 mg in the evening with food  07/21/22 appt noted: Trouble keeping mood chart.  If I talk and I'm heard then feels better for hours but then damn stopped up.  Almost like an obsessive thing to talk for relief. No clear effect of increase Latuda except reduced som cognitive anxiety but not physical anxiety. Says there was med he took before paxil that gave him instant relief and wonders about retrying it.  Doesn't know which med. Paxil had best effect when it worked for a period of time. Exhausted but fidget when sits.   Dep and anxious moderate.  09/01/22 appt noted: Meds clonazepam 0.5 mg BID prn, fluvoxamine  200 mg daily, lithium 900 HS, latuda 120 mg daily, vit D3 This type of dep since November after mania is low energy, anhedonia, low appetite, a lot of time in bed, "textbook" depresssion, lack of interest but not debilitating sadness.   Tired of it.  Dep is worse than anxiety and used to be the other way around.  Not as irritable. In past dep was anxious and disconnected and wasn't as tired.  Had to  leave work bc can't get himself out of bed.   Toss and turn in bed fidgety in bed, feels like has to move but no energy to walk.  Fidgety for 3 mos. Clonazepam takes the edge off but feels a higher dose is needed.  Plan: Changes: Reduce Latuda to 60 mg daily. Start samples of Auvelity 1 in the AM for dep that is TRD  10/20/22 appt noted: Meds clonazepam 0.5 mg BID prn, fluvoxamine 200 mg daily, lithium 900 HS, latuda 60 mg daily, vit D3, out of Auvelity after 2 weeks. Less restless but still has it 6/10 after reduced Latuda.  Thinks he had it before eLatuda but not sure. Takes an hour to get to sleep.   No change in dep from this visit to last visit. Did have positive effects from Hanna but ? SE conc.  Did help energy Lately more dep than anxiety.  Noted ASA helps anxiety. Couple times situational anger but no mood swings.  On disability for 7-8 years.  Working PT [employer] for [employer].  Sober 2 year 01/2019  Past psych meds:  Paxil 60 "too much" + wt gain,   duloxetine, Zoloft,  Viibryd 60 partial resp (trial after paroxetine) SE diarrhea. Trintellix 10 agitated Wellbutrin 75 (max tolerated),   Fluvoxamine 300 no better than 200 which seems to take edge off anxiety Clomipramine mania  Abilify, Rexulti 2. Vraylar irritable at 3 mg daily.  Latuda 120 fidgety  Buspar 30 BID stopped DT partial effect.  Xanax craving,   Olanzapine 10 fatigue Risperdal 2 once Latuda 120 NR dep likely akathisia  lithium  900, worse when he stopped lithium Lamotrigine NR CO dizzy  NAC 600 helped Modafinil 200 agitated Took lorazepam  in the past, Xanax less helpful  B severe OCD obsessions psych tx.  Review of Systems:  Review of Systems  Constitutional:  Positive for fatigue. Negative for unexpected weight change.  Cardiovascular:  Negative for chest pain and palpitations.  Gastrointestinal:  Positive for constipation. Negative for nausea.  Genitourinary:  Negative for urgency.   Neurological:  Negative for tremors.  Psychiatric/Behavioral:  Positive for dysphoric mood. Negative for behavioral problems, confusion, decreased concentration, hallucinations, self-injury, sleep disturbance and suicidal ideas. The patient is nervous/anxious. The patient is not hyperactive.     Medications: I have reviewed the patient's current medications.  Current Outpatient Medications  Medication Sig Dispense Refill   amLODipine (NORVASC) 5 MG tablet TAKE 1 TABLET BY MOUTH EVERY DAY 90 tablet 3   atorvastatin (LIPITOR) 40 MG tablet TAKE 1 TABLET(40 MG) BY MOUTH DAILY 90 tablet 1   clonazePAM (KLONOPIN) 0.5 MG tablet TAKE 1 TABLET(0.5 MG) BY MOUTH TWICE DAILY AS NEEDED FOR ANXIETY 30 tablet 0   lithium carbonate 300 MG capsule TAKE 4 CAPSULES(1200 MG) BY MOUTH AT BEDTIME (Patient taking differently: Take 900 mg by mouth at bedtime.) 360 capsule 0   lurasidone (LATUDA) 40 MG TABS tablet Take 1 tablet (40 mg total) by mouth daily with breakfast. 30 tablet 1  Lurasidone HCl 60 MG TABS Take 1 tablet (60 mg total) by mouth every evening. 30 tablet 0   Dextromethorphan-buPROPion ER (AUVELITY) 45-105 MG TBCR Take 1 tablet by mouth 2 (two) times daily. (Patient not taking: Reported on 10/20/2022) 60 tablet 1   fluvoxaMINE (LUVOX) 100 MG tablet Take 1.5 tablets (150 mg total) by mouth at bedtime. 135 tablet 0   No current facility-administered medications for this visit.    Medication Side Effects: sexual, weight  Allergies: No Known Allergies  Past Medical History:  Diagnosis Date   Anxiety    Bimalleolar fracture of left ankle    Depression    Heart murmur    Sleep apnea    uses CPAP nightly    Family History  Problem Relation Age of Onset   Cancer Maternal Grandfather    Colon cancer Neg Hx    Colon polyps Neg Hx    Esophageal cancer Neg Hx    Rectal cancer Neg Hx    Stomach cancer Neg Hx     Social History   Socioeconomic History   Marital status: Single    Spouse  name: Not on file   Number of children: Not on file   Years of education: Not on file   Highest education level: Associate degree: occupational, Scientist, product/process development, or vocational program  Occupational History   Not on file  Tobacco Use   Smoking status: Every Day    Current packs/day: 1.50    Average packs/day: 1.5 packs/day for 47.6 years (71.4 ttl pk-yrs)    Types: Cigarettes    Start date: 03/22/1975   Smokeless tobacco: Never   Tobacco comments:    He reports he has quit 3 times and recently started back in 2012. Hsm   Vaping Use   Vaping status: Never Used  Substance and Sexual Activity   Alcohol use: Not Currently   Drug use: No   Sexual activity: Not on file  Other Topics Concern   Not on file  Social History Narrative   Not on file   Social Determinants of Health   Financial Resource Strain: Low Risk  (07/21/2022)   Overall Financial Resource Strain (CARDIA)    Difficulty of Paying Living Expenses: Not hard at all  Food Insecurity: No Food Insecurity (07/21/2022)   Hunger Vital Sign    Worried About Running Out of Food in the Last Year: Never true    Ran Out of Food in the Last Year: Never true  Transportation Needs: No Transportation Needs (07/21/2022)   PRAPARE - Administrator, Civil Service (Medical): No    Lack of Transportation (Non-Medical): No  Physical Activity: Inactive (07/21/2022)   Exercise Vital Sign    Days of Exercise per Week: 0 days    Minutes of Exercise per Session: 0 min  Stress: Stress Concern Present (07/21/2022)   [employer] of Occupational Health - Occupational Stress Questionnaire    Feeling of Stress : Very much  Social Connections: Socially Isolated (07/21/2022)   Social Connection and Isolation Panel [NHANES]    Frequency of Communication with Friends and Family: Three times a week    Frequency of Social Gatherings with Friends and Family: Twice a week    Attends Religious Services: Never    Database administrator or Organizations: No     Attends Banker Meetings: Not on file    Marital Status: Never married  Intimate Partner Violence: Not At Risk (07/29/2021)   Humiliation, Afraid, Rape,  and Kick questionnaire    Fear of Current or Ex-Partner: No    Emotionally Abused: No    Physically Abused: No    Sexually Abused: No    Past Medical History, Surgical history, Social history, and Family history were reviewed and updated as appropriate.   Please see review of systems for further details on the patient's review from today.   Objective:   Physical Exam:  There were no vitals taken for this visit.  Physical Exam Constitutional:      General: He is not in acute distress.    Appearance: Normal appearance. He is well-developed.     Comments: Red face  Musculoskeletal:        General: No deformity.  Neurological:     Mental Status: He is alert and oriented to person, place, and time.     Motor: No tremor.     Coordination: Coordination normal.     Gait: Gait normal.  Psychiatric:        Attention and Perception: Attention normal. He is attentive.        Mood and Affect: Mood is anxious and depressed. Affect is not labile, blunt, angry or inappropriate.        Speech: Speech normal. Speech is not slurred.        Behavior: Behavior normal. Behavior is not agitated or slowed.        Thought Content: Thought content normal. Thought content is not delusional. Thought content does not include homicidal or suicidal ideation. Thought content does not include suicidal plan.        Cognition and Memory: Cognition normal.        Judgment: Judgment normal.     Comments: Insight  Fair. Continued mood problems without much mania but irritable and anxious.  Dep worse than anxiety      Lab Review:     Component Value Date/Time   NA 141 03/01/2022 0904   K 4.0 03/01/2022 0904   CL 107 03/01/2022 0904   CO2 27 03/01/2022 0904   GLUCOSE 81 03/01/2022 0904   BUN 18 03/01/2022 0904   CREATININE 1.01  03/01/2022 0904   CALCIUM 9.6 03/01/2022 0904   PROT 7.6 06/17/2021 1350   ALBUMIN 4.7 06/17/2021 1350   AST 19 06/17/2021 1350   ALT 23 06/17/2021 1350   ALKPHOS 108 06/17/2021 1350   BILITOT 0.5 06/17/2021 1350       Component Value Date/Time   WBC 9.5 07/21/2022 1143   RBC 5.10 07/21/2022 1143   HGB 15.9 07/21/2022 1143   HGB 15.2 05/05/2010 1049   HCT 45.7 07/21/2022 1143   HCT 44.8 05/05/2010 1049   PLT 218.0 07/21/2022 1143   PLT 193 05/05/2010 1049   MCV 89.7 07/21/2022 1143   MCV 89.6 05/05/2010 1049   MCH 30.4 05/05/2010 1049   MCHC 34.8 07/21/2022 1143   RDW 13.4 07/21/2022 1143   RDW 13.1 05/05/2010 1049   LYMPHSABS 2.5 07/21/2022 1143   LYMPHSABS 1.8 05/05/2010 1049   MONOABS 0.8 07/21/2022 1143   MONOABS 0.4 05/05/2010 1049   EOSABS 0.6 07/21/2022 1143   EOSABS 0.2 05/05/2010 1049   BASOSABS 0.1 07/21/2022 1143   BASOSABS 0.0 05/05/2010 1049    Lithium Lvl  Date Value Ref Range Status  04/01/2022 0.9 0.6 - 1.2 mmol/L Final  04/01/22 lithium level 0.9 on 1200 mg daily  02/02/21 lithium level 0.6 on 900 mg daily  No results found for: "PHENYTOIN", "PHENOBARB", "VALPROATE", "CBMZ"   .  res Assessment: Plan:     Claris was seen today for follow-up, depression and anxiety.  Diagnoses and all orders for this visit:  Bipolar II disorder (HCC) -     lurasidone (LATUDA) 40 MG TABS tablet; Take 1 tablet (40 mg total) by mouth daily with breakfast.  GAD (generalized anxiety disorder) -     fluvoxaMINE (LUVOX) 100 MG tablet; Take 1.5 tablets (150 mg total) by mouth at bedtime.  Mixed obsessional thoughts and acts -     fluvoxaMINE (LUVOX) 100 MG tablet; Take 1.5 tablets (150 mg total) by mouth at bedtime.  Antipsychotic-induced akathisia  OSA (obstructive sleep apnea)  Low vitamin D level  Lithium use  Low serum vitamin B12  Alcohol dependence in remission (HCC)    Chronic recurrent rumination with anxiety and urgency and sense of need and  instablity with impatience ongoing with frequent phone calls between appts. but  but anhedonic depression and anxious.  Needs a lot of reassurance. Chronic worry and overwhelmed easily and misattributes sx as SE of meds leading to inadequate med trials.  Fear of meds.Falsely attributes his psych sx to the meds and then stops meds AMA.  False attribution of sx as SE. Discussed the use of a mood chart which may help address some of this part attribution and better delineate mood cycling.  Gave him a copy of the chart reviewed in detail with him as to how to keep it.  Anhedonic dep and anxiety and akathisia problems todayu  Anxiety was better but not gone on fluvoxamine 200 . ? Contributing to restlessness Reduce to 150 mg .   he describe history other manic cycling symptoms including increased interest, increased goal-directed behaviors, increased urge to spend that will last for 6 weeks or so and then stop.  He has these cycles about 3 times a year.  This is suggestive of a bipolar predisposition which was confirmed by recent cycle into mania from clomipramine.  Continue CPAP use. New CPAP machine.  Discussed potential metabolic side effects associated with atypical antipsychotics, as well as potential risk for movement side effects. Advised pt to contact office if movement side effects occur.  He agrees.  Call if you have any problems with this.  He remains sober.  He wants to consider counseling to work through some personal issues as well as address specific symptoms like his anger and irritability. Rec local AA instead of online. clonazepam DT TR status 0.5 mg BIDprn No evidence of abuse.  Bc late 2023 mania with clomipramine increased lithium 1200  mg daily .   He cut it back to 900 mg daily.  He admits to feeling worse when he stopped the lithium.  Call if sx get worse. Counseled patient regarding potential benefits, risks, and side effects of lithium to include potential risk of lithium  affecting thyroid and renal function.  Discussed need for periodic lab monitoring to determine drug level and to assess for potential adverse effects.  Counseled patient regarding signs and symptoms of lithium toxicity and advised that they notify office immediately or seek urgent medical attention if experiencing these signs and symptoms.  Patient advised to contact office with any questions or concerns. 02/02/21  02/02/21 lithium level 0.6 on 900 mg daily, normal B12 and vitamin D 52 04/01/22 lithium level 0.9 on 1200 mg daily  02/02/21 lithium level 0.6 on 900 mg daily  Disc the off-label use of N-Acetylcysteine at 600 mg daily to help with mild cognitive problems.  It  can be combined with a B-complex vitamin as the B-12 and folate have been shown to sometimes enhance the effect.  Partial benefit so continue NAC 1200 mg daily.  Reduce  Latuda from 120 to 40 mg . DC other doses of Latuda DT akathisia.    Continue vitamin D DT history level 30  Please see After Visit Summary for patient specific instructions. Changes: Reduce fluvoxamine to 1 and 1/2 tablet daily to see if restlessness better Reduce Latuda from 60 to 40 mg daily to see if restlessness better Retry Auvelity 1 in the AM for 7 days then 1 twice daily  FU 8 weeks  Meredith Staggers, MD, DFAPA     Future Appointments  Date Time Provider Department Center  11/14/2022 10:30 AM Cottle, Steva Ready., MD [facility] None    No orders of the defined types were placed in this encounter.       -------------------------------

## 2022-10-20 NOTE — Patient Instructions (Addendum)
Reduce fluvoxamine to 1 and 1/2 tablet daily to see if restlessness better Reduce Latuda from 60 to 40 mg daily to see if restlessness better Retry Auvelity 1 in the AM for 7 days then 1 twice daily

## 2022-10-31 ENCOUNTER — Other Ambulatory Visit: Payer: Self-pay | Admitting: Psychiatry

## 2022-10-31 DIAGNOSIS — F324 Major depressive disorder, single episode, in partial remission: Secondary | ICD-10-CM

## 2022-11-14 ENCOUNTER — Encounter: Payer: Self-pay | Admitting: Psychiatry

## 2022-11-14 ENCOUNTER — Ambulatory Visit: Payer: Medicare Other | Admitting: Psychiatry

## 2022-11-14 DIAGNOSIS — Z79899 Other long term (current) drug therapy: Secondary | ICD-10-CM

## 2022-11-14 DIAGNOSIS — F411 Generalized anxiety disorder: Secondary | ICD-10-CM | POA: Diagnosis not present

## 2022-11-14 DIAGNOSIS — F422 Mixed obsessional thoughts and acts: Secondary | ICD-10-CM

## 2022-11-14 DIAGNOSIS — G4733 Obstructive sleep apnea (adult) (pediatric): Secondary | ICD-10-CM

## 2022-11-14 DIAGNOSIS — F3181 Bipolar II disorder: Secondary | ICD-10-CM | POA: Diagnosis not present

## 2022-11-14 DIAGNOSIS — F1021 Alcohol dependence, in remission: Secondary | ICD-10-CM

## 2022-11-14 DIAGNOSIS — E538 Deficiency of other specified B group vitamins: Secondary | ICD-10-CM

## 2022-11-14 DIAGNOSIS — R7989 Other specified abnormal findings of blood chemistry: Secondary | ICD-10-CM

## 2022-11-14 DIAGNOSIS — G2571 Drug induced akathisia: Secondary | ICD-10-CM

## 2022-11-14 MED ORDER — QUETIAPINE FUMARATE ER 150 MG PO TB24
150.0000 mg | ORAL_TABLET | Freq: Every day | ORAL | 0 refills | Status: AC
Start: 2022-11-14 — End: ?

## 2022-11-14 NOTE — Patient Instructions (Signed)
Stop Auvelity Stop [employer] quetiapine ER 150 mg in evening

## 2022-11-14 NOTE — Progress Notes (Signed)
KEANDRE POYER [id number] Dec 14, 1965 57 y.o.   Subjective:   Patient ID:  CURRAN SHVARTS is a 57 y.o. (DOB 09-16-1965) male.  Chief Complaint:  Chief Complaint  Patient presents with   Follow-up   Depression   Anxiety    Depression        Associated symptoms include fatigue.  Associated symptoms include no decreased concentration and no suicidal ideas.  Past medical history includes anxiety.   Anxiety Symptoms include nervous/anxious behavior. Patient reports no chest pain, confusion, decreased concentration, nausea, palpitations or suicidal ideas.       [name] presents to the office today for follow-up of anxiety , depression, alcohol.    seen August 7.  His anxiety was unmanaged at Paxil 40 mg and olanzapine 5 mg so olanzapine was increased to 7.5 daily.  seen December 17, 2018.  His anxiety was unmanaged.  The following change was made: Trial 1-1/2 of the 7.5 mg tablets of olanzapine for anxi.  He is had a stay at [facility] for alcohol dependence since he was last here. Stayed 28 days and DC before Thanksgiving.  Very helpful.  Was having NM nighly before and they stopped since then.  Not drinking.  Involved in AA since then.  seen March 25, 2019.  The patient was doing better requested a reduction in olanzapine to 7.5 mg nightly to improve energy.  visit April 03, 2019.  He had remained sober and his anxiety was improved with sobriety.  He was having problems with low motivation and forgetfulness.  At the next refill it was decided olanzapine would be reduced from 7.5 to 5 mg daily. He called requesting this urgent appointment because of worsening symptoms associated with returning to work.  seen May 21, 2019.  He was doing relatively well.  The following was noted: Tolerating Wellbutrin and it seems to help. He didn't tolerate the increase. Anxiety is improved off alcohol.  Ok to reduce olanzapine to 2.5 mg daily for a month and stop it.  Hopefully  low motivation will improve.   He called back March 23 stating he was more depressed and having a harder time doing routine tasks and having suicidal thoughts.  Because of worsening depression after reducing the olanzapine he was encouraged to increase olanzapine back to 5 mg nightly. He called again March 24 stating he was really struggling but was scheduled for an appointment on March 26.  As of June 14, 2019 he reports the following: Every day a real hard struggle to get through the days.  More anxious and depressed equally. Worse 2 weeks.  Last Saturday so overwhelmed by how he feels he had SI.  Everything is hard. Initial benefit paroxetine has been lost.  Awakens stressed and tired. Adequate hours of sleep.  Will be major chore just to mow grass.  Still sober. SE sexual. Getting appt with CPAP doc. Changes made include: Increasing olanzapine back to 5 mg daily a couple of days prior to the appointment due to phone call and the addition of lithium 300 mg for a few days then increase to 600 mg nightly both to augment the antidepressant and because of suicidal thoughts.  July 15, 2019 appointment, the following is noted: Doing OK with both depression and anxiety.  Kind of like a top always going in mind.  Even when I sleep, when awakens mind still going.  NM nightly for years but less than before stopped drinking.  Smaller and less severe ones  that don't wake him.  Theme can't finish things.  Both depression and anxiety 3/10.  More productive.  Paces himself.  Can live with it.  Sleep 9 hours.  Initially olanzapine stopped the ruminating but it's back some.  Overall still benefit.  No SI and can't rmember why he had them before. Residual energy not great and some ruminating. He had some concerns about sexual side effects from Paxil but agreed that no med changes were necessary despite residual symptoms of depression and anxiety.  08/14/2019 appointment, the following is noted:  He scheduled  urgently. Vaccinated. Angry easily and exhausted and everything is a chore.  Exhausted. Never had appt with CPAP company.  CPAP is 58 years old and on same settings as in the past.  CPAP machine is not working properly but when it did it was helpful for alertness and energy.  Disc need to call them to have it evaluated. To bed 10-7.  Don't feel rested in morning but used to feel rested when first started paroxetine. Plan: Increase Lithium continue to 900 mg daily for irritability  11/19/19 appt with the following noted: He increased to 900 mg and then reduced it but doesn't know why.  Reduced it to 300 nightly. Irritability much better.  Once anger since here with rage but not outward. Still on paroxetine 40, olanzapine 5, Wellbutrin 75 AM. Getting tired in afternoon and worrying about getting depressed but he's not depressed now.  Don't enjoy things like he used to.  Les interest and excitement. Appetite and sleep are normal.   Still adjusting without alcohol and socialization. Worrying about lack of sex drive with paroxetine. Can still ruminate on things until he gets some reassurance through talking with people. Plan: Continue low-dose lithium 300 mg daily Continue Paxil.  Did not feel well at 60 mg so we will continue 40 mg.  Likely to lose anxiety benefit if change it. Continue low-dose Wellbutrin 75  Tolerating Wellbutrin and it seems to help. He didn't tolerate the increase.   Continue olanzapine 5 mg  02/18/2020 appointment with following noted: Open to trying increase paroxetine again bc ruminating wears him out and gets fatigued and it wears him out though is 80% better vs before paroxetine. Doesn't remember trying higher paroxetine.  Ruminates on relationships or work.  Whenever he can talk about it with someone it helps tremendously but also realizes paroxetine helps. Anxiety affects dreams and NM too.  Up and down a little.  Anxiety drove depression before paroxetine.  Best med he's  tried. SOBER 1 YEAR SE no problem.  Except gained 25# in 4 years.  No change in diet and exercise.  No increase in appetite and no food cravings.  Seeing PCP soon. Plan: Increase paroxetine trial to 60 for rumination.  Disc SE.  05/26/20 appt noted: No benefit increase paroxetine nor SE.  Wants to reduce back to 30 mg paroxetine and stop lithium bc it didn't seem to help.   4 days of Calm app has seemed to help rumination.  Does it in afternoon and before bed and it seemed to helps.  Doing a 10 min meditation and it helps.   Dep 4/10.  Anxiety 4/10. And a lot better the last few days.   Plan: Reduce lithium by 1 tablet per week.  Call if there is any suicidal thought. Starting April 1 reduce paroxetine to 40 mg daily. Continue low-dose Wellbutrin 75  Tolerating Wellbutrin and it seems to help. He didn't tolerate the  increase.   If doing OK will try taper as he suggested at next visit. Continue olanzapine 5 mg nightly it was helpful for depression and anxiety.  07/01/2020 appt with following noted: Moved appt up. Now says he understated depression level when he was here the last time. Restarted lithium bc felt more depressed without it. Reduced paroxetine to 40 mg daily. Lethargic.  Mind won't calm down.  Nonrestorative sleep but 8 hours.  Doesn't think sleep is deep enough.  Tense. Goes to [employer]. Started counseling. Wants to try something different with meds. Can have mixed sx for 6 weeks with increased interest in things and motivation and want to spend money but still feels depressed.  3 times in the last year. Plan: Increase  lithium back to 3 daily to see if depression is better and less mood cycling.  He admits to feeling worse when he stopped the lithium Continue the reduce paroxetine to 45 mg daily. DC Wellbutrin Lamotrigine trial 25 mg to 100 mg daily over 4 weeks. Continue olanzapine 5 mg nightly it was helpful for depression and anxiety.  08/26/2020 appointment with  the following noted: Sad and down.  Classic depression sx and very fatigued.  Unusual to have classic depression bc usually is atypical.  No effect from lamotrigine. Wonders if atorvastatin is causing some confusion or depression bc read about it. Depressed for 3 mos. Gained 20#.  It's leveled off now. Olanzapine helped the rumination and anxiety.   Life is more depressing bc not seeing friends like it was bc stopped drinking.   No SE with lithium.  Unless dropping things. Sleep 1030 to 6 but doesn't sleep deep.  Not rested in AM Plan: Increase  lithium back to 3 daily to see if depression is better and less mood cycling.  He admits to feeling worse when he stopped the lithium Continue the reduce paroxetine to 40 mg daily bc it helped anxiety. Lamotrigine increase to 150 mg daily DT no benefit or SE Increase olanzapine10 mg nightly it was helpful for depression and anxiety.  Need more help with depression   09/11/2020 phone call: Pt stated he is foegetting things throughout the day and very unsteady.He works with tools and equptment and this is a problem.He stated he takes 6 Lamictal in the am. MD response: We made 3 med changes at the last visit including increasing lamotrigine to 150 mg daily, increasing olanzapine to 10 mg daily, and restarting lithium.  Any 1 of those changes could possibly cause some of the side effects.  We will have to make 1 change at a time to evaluate this.  Therefore reduce lamotrigine to 100 mg daily.  It may take a couple of weeks before he sees a difference.  11/13/2020 phone call: Patient lm stating he is currently taking Lamotrigine 150 mg. He has decreased it to 75 mg due to side effects per pt ( blurred vision, shaking and coordination issues). Patient stated he is discontinuing before his scheduled October appointment. MD response: Take the lamotrigine 75 mg for 2 weeks and he should be able to stop then without withdrawal.  If he gets shakey, nervous then we need  to reduce more slowly and let us know that.  Otherwise he can stop if he follows these directions.  11/30/2020 phone call: Pt called and advised he would like to taper off of the Olanzapine.  He has gained 8# since his last visit and 30# total since he started taking the med.  He feels steady, not so much stress now, and would like to see if he can take Lorazepam instead.  Then talk to Dr. Jennelle Human in Oct when they meet. MD response: Reduce olanzapine to one half of the 10 mg tablet at night for 4 weeks and then stop it.  If he gets more depressed then get back in touch with [place] and we will try to do something about the weight gain that olanzapine can cause by using the alternative Lybalvi  12/07/2020 appointment with the following noted: Off lamotrigine .  Reduced olanzapine to 5 mg for a week.  On lithium 900 mg HS, paroxetine 40 Fatigue 10/10.  Dizziness resolved off lamotrigine.  Low fatigue and motivation.  "I want to want to do things".  Stopped drinking almost 18 mos and socially big adjustment.   Still invloved with AA and daily sponsor.  Working [employer].  Reece Levy Young helped. Thinks mild depression with little interest and motivation and no joy.  But not severe.  No SI. No anxiety and it's way better.  Life is more stable and helps.  Working 4 hours daily and occ extrea jobs. Uses new CPAP.  Stil drowsy and fatigue. Plan: Continue CPAP use. New CPAP machine. Trial modafinil 100-200 mg AM Lamotrigine off DT NR Reduced olanzapine to 5 mg daily for a week and well stop in 3 weeks DT wt gain and fatigue  01/20/2021 phone call: I called patient to see if I could get better information. He states he started modafinil a couple of weeks ago, though Rx was written for in September. He states he didn't feel well before taking the modafinil though. He just says he is agitated, irritable, and unstable feeling. When asked what he meant by unstable he stated feeling a "shell of myself".  He has been put on the  cancellation list.  MD response: It is likely the modafinil that is causing this problem.  But it may be corrected with a dose reduction.  I assume he is taking a whole tablet.  If that is the case time to reduce to 1/2 tablet in the morning.  If he is taking half a tablet tell him to reduce it to a quarter of a tablet.  And I agree we need to try to get him in as soon as possible. He reduced modafinil from 200 mg to 100 mg daily.  02/10/2021 appointment with the following noted: Current psych meds lithium 900 mg nightly, paroxetine 40 mg daily, olanzapine recently stopped, modafinil recently tried. Was really bad for awhile feeling depresssed and agitated and anxiety but better now including after reducing modafinil to 100 mg daily. Fatigue is better and in the middle now with energy, not good but not bad. Sleep is better.  CPAP doctor yesterday and they are gathering info pending. NAC is helping with memory. Still sober and committed to it. SE constipation  Plan: No med changes  03/10/2021 phone call from patient complaining of vague feelings of not feeling well and wanting the Paxil changed.  He was placed on the cancellation list.  03/18/2021 appointment urgently scheduled at his request: Remains on paroxetine 40 mg daily, lithium 1200 mg daily, modafinil 200 mg every morning. "Ruminating"  with sense of need to talk to someone.  Then feels better for awhile.  Mostly on relationships with family.  No one to talk to. Wonders need to chang ethe meds.   Don't socialize anymore since sober.  Does online AA everyday at  noon. Lost 10# off olanzapine. Plan: Continue paroxetine 40 mg, lithium 1200 mg daily, Try stopping modafinil 100mg  AM to determine if it is helping or not. If not less anxious then start risperidone for rumination 1-2 mg HS  04/05/2021 appointment with the following noted: Stopped  modafinil and it helped reduce anxiety. Started risperidone 2 instead of 1 mg and was off  balance so reduced risperidone to 1 mg HS. Only done this for 3 nights.  No SE with it. Anxiety is not close to as bad as originally.  Can be painful but not now. Still ruminating some "my whole adult life" but varies.  Anxiety can lead to depression. 6-7/10 with 10 good on depression with some problems with ambition. Plan: Continue trial risperidone for rumination 1 mg HS  04/07/2021 TC: Several phone calls: Called 2 days after the visit complaining of itching from the lithium and wanting to come off of it.  He was warned he was more emotionally distressed when he reduced the lithium in the past but he wanted to pursue it anyway.  He was continuing risperidone 1 mg nightly but wanting to reduce that also but was warned he would almost certainly have worsening mood severity and anxiety problems based on past experience.  He was cautioned against reducing lithium below 900 mg daily and risperidone below 1 mg nightly.  He agreed on 04/09/2021.  04/09/2021 he called again and several phone calls ensued thereafter.  He insisted on stopping lithium and risperidone despite warnings that his mood stability and anxiety would worsen. He was prescribed Depakote to take the place  04/20/2021 appointment with the following noted: He stopped risperidone and did not take the Depakote.  He is taking lithium 900 mg nightly and paroxetine 40 mg daily. Stopped risperidone bc feeling agitated and unstable and blamed it.  Still feels the same off of it. Says itching 85% better with less lithium.  Itching for a couple of mos.  Doesn't think anxiety noticeably worse after reducing lithium to 900 mg daily. Not dizzy.  Clumsy with hands. Anxious but not painful like it was before Paxil but tense at the end of the day.  Initially felt normal for the first time in 20 years when first started paxil but not as good now. Not sadness and hasn't had much of it. Started therapy with doctor on demand. Ruminating on "fear of not  being fully present" and worries about little things like appts pending.   Afraid of working FT because gets overwhelmed bc "I take it home with me" Plan : Continue trial risperidone but increase to rumination 1.5 mg HS Continue paroxetine 40 and lithium 600 mg   06/01/2021 appointment with the following noted: Haven't seen benefit with risperidone 1.5 mg daily. Not in pain but feels disconnected and not myself.  Has felt like this off and on for years. Level of angst all day but not real high unless triggered.  Still has the rumination. Initially a lot of benefit from paroxetine Not markedly deprssed and not sad.  07/14/21 appt noted:  Doesn't feel much different with switch to fluvoxamine except anxiety well controlled. But still low energy, motivation and not as productive as he wants to be.  Not markedly sad.  Asks about Vraylar. Not much interest or enjoyment. Don't sleep that great and don't relax that great. On fluvoxamine 200, lithium 900, off risperidone. Plan: Anxiety controlled by fluvoxamine 200 Potentiate Vraylar 1.5 mg daily  07/27/2021 phone call complaining of ongoing  depression.  Vraylar increased to 3 mg daily. 07/29/2021 phone call complaining of cost of Vraylar.  Was given samples. 08/20/2021 phone call complaining of fatigue.  Stated he stopped the Vraylar because it made him agitated.  It was recommended there be no further med changes because he is only been off the Vraylar for 1 week.  08/30/2021 appointment noted: Vraylar 1.5 mg didn't do much.  Increased to 3 mg and then felt irritable ans stopped it about 3rd week of May.  Better now. Still extremely tired and feels physically sick with normal workup. Thinks his mind wears him out.  Racing negative thoughts all the time. Asks about Ativan. Always nervous and anxious.  Trouble discerning anxiety from depression. Plan: Anxiety was controlled by fluvoxamine 200 but now he thinks it is worse. So increase 250 mg  daily  10/18/21 appt noted: No results with increase fluvoxamine.  No SE. Anxiety is still worse than depression.  Thinks it causes somatic sx like the flu.  Exhausted. GI. Poor sleep and concentration. Asks about lorazepam.   Got checked out by PCP without findings. Plan: Reduce lithium to 2 capsules daily to see if physical symptoms like fatigue etc. are better Increase fluvoxamine to 1 tablet in the morning and 2 tablets in the evening to reduce anxiety Plan Trial reduction of  lithium 900  to 600 mg daily to see if somatic sx are better like fatigue.   01/06/22 appt noted" Everything is about the same with med changes. CC anxiety and fatigue.  Sx started with severe depression in 20s and got better but never resolved. Thinks anxiety is causing fatigue.  Has to nap about 1 pm.  Easily fatigued.  No particular worry other than how he feels and himself.. Plan: Reduce fluvoxamine gradually to 150 mg daily Start clomipramine 25 mg nightly for 5 days and increase to 75 mg HS  02/07/22 TC:  Pt lvm that the clomiprame 25 mg has been taking the full dose for two weeks now. He is experiencing mania. Overspending etc    MD resp:  Increase the lithium back to 3 daily.  Stop the clomipramine.  Resume the risperidone until these symptoms clear up.  Call us back next week and let us know how it is going.     02/16/22 TC:   Pt was just calling to give you an update.He stated he is still 50 percent manic but feels better.He is currently taking   lithium carbonate 300 MG 3 daily risperiDONE 3 mg 1/2 tab daily fluvoxamine 100 MG tablet 1/2 tablet in the AM and 1 tablet at night On 11/20 changes were made,you had him stop clomipramine                     and increase lithium to 900 mg daily.  02/16/22 MD: check lithium level.  02/24/22 tC complaining of depression. MD :  He has had recent mood swings from manic to depressed by his report.  Lithium level is low at 0.5 on 600 mg daily.  I think the  most helpful thing for his mood would be to increase lithium back to 900 mg daily.      03/07/22 TC: pt reported increasing his Luvox to 200 mg daily.  03/08/22 appt noted: Multiple phone calls since here.   Clomipramine triggered mania like he's never had before.  Increased over the top looking for a house, wanting to invent things, bought a shed and fish tank abruptly.  Left his job which was a good thing.  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  I feel a lot better about it.  Has disability and will do PT work.  I'll be OK. Exhausted and can't be on his feet all the time.   Sleep is different with EMA and ready to go but watches TV awhile and then goes back to sleep.  Always a late sleeper all his life.   Still feeling some depression and he increased the luvox to 200 and it helped. Thinks parents are Autistic and don't respond emotionally.  M cannot understand feelings and he doesn't get any support.   Plan: Bc recent mania with clomipramine increase lithium 1200  mg daily .    03/29/2022 appointment urgently due to recent mania: Psych meds: fluvoxamine 200, lithium 1200, risperidone 1.5 mg HS Exhausted until the last couple of days. SE tremor Has not had any water today and is lightheaded.   Mood a little better and anxiety better than it was.  No mania. Some underlying weight in mood that only responded to paroxetine and clomipramine. Asks about Wellbutrin for energy. Sleep normal 8-9 hours now.  Except last night. Goes to [facility] groups and support group and AA. Sober Reduce risperidone to 1 mg to see if energy better  05/25/22 appt noted: Reduced lithium about 10 days ago bc thought he might be getting too much bc read on the internet.  No changes since reducing it. More dep for a couple of mos which is unusual.  In bed a lot and more isolated and not normally what I have.  Not real sad but unmotivated and no energy to do things.    Start therapist Friday. No sig sadness.   Irritable and short with people over little things.  Kind of started after stopped drinking.  Worse.  Sleep on and off.  Erratic.  Looks for ward to night so can escape into sleep.   Anxious underlying for 30 years.  Hard to sit and relax and just watch TV.  Tends to ruminate. Asks to try Gaba. Now lethargy is worse than chronic anxiety Reducing risperidone made no changes good or bad. DC risperidone 1 mg HS Add Latuda 1/2 tablet for 1 week then 1 tablet in the the evening  07/06/22 appt :  Increased Latuda up to 100 mg .  No clear SE. Exhausted mentally and it makes his body tired.  Working 10-2 and then comes home and lays in bed a couple of hours.  Not a deep sleep.  Never feels fully rested.  Sleep HS 10-7.   Normal appetitie.   Still some sadness and irritability.  Anxiety doesn't seem better but is having less mood swings and mania.  Anxiety wears him down.   Plan: Increase  Latuda 120 mg in the evening with food  07/21/22 appt noted: Trouble keeping mood chart.  If I talk and I'm heard then feels better for hours but then damn stopped up.  Almost like an obsessive thing to talk for relief. No clear effect of increase Latuda except reduced som cognitive anxiety but not physical anxiety. Says there was med he took before paxil that gave him instant relief and wonders about retrying it.  Doesn't know which med. Paxil had best effect when it worked for a period of time. Exhausted but fidget when sits.   Dep and anxious moderate.  09/01/22 appt noted: Meds clonazepam 0.5 mg BID prn, fluvoxamine  200 mg daily, lithium 900 HS, latuda 120 mg daily, vit D3 This type of dep since November after mania is low energy, anhedonia, low appetite, a lot of time in bed, "textbook" depresssion, lack of interest but not debilitating sadness.   Tired of it.  Dep is worse than anxiety and used to be the other way around.  Not as irritable. In past dep was anxious and disconnected and wasn't as tired.  Had to  leave work bc can't get himself out of bed.   Toss and turn in bed fidgety in bed, feels like has to move but no energy to walk.  Fidgety for 3 mos. Clonazepam takes the edge off but feels a higher dose is needed.  Plan: Changes: Reduce Latuda to 60 mg daily. Start samples of Auvelity 1 in the AM for dep that is TRD  10/20/22 appt noted: Meds clonazepam 0.5 mg BID prn, fluvoxamine 200 mg daily, lithium 900 HS, latuda 60 mg daily, vit D3, out of Auvelity after 2 weeks. Less restless but still has it 6/10 after reduced Latuda.  Thinks he had it before eLatuda but not sure. Takes an hour to get to sleep.   No change in dep from this visit to last visit. Did have positive effects from [place] but ? SE conc.  Did help energy Lately more dep than anxiety.  Noted ASA helps anxiety. Couple times situational anger but no mood swings. Plan: Changes: Reduce fluvoxamine to 1 and 1/2 tablet daily to see if restlessness better Reduce Latuda from 60 to 40 mg daily to see if restlessness better Retry Auvelity 1 in the AM for 7 days then 1 twice daily  11/14/22 appt noted: Twitching and restlessness better.   Still some teeth grinding but better Auvelity didn't help mood.  Ongoing classic lethargy, poor appetite.  Can't exercise bc depression.  Little tasks overwhelming.   Meds:  clonazepam 0.5 mg BID prn, fluvoxamine 150 mg daily, lithium 900 HS, latuda 40 mg daily, vit D3,  Auvelity BID SE constipation and bruxism. Sleep fair and best and most relaxed 730-930 AM.  To bed 10 pm.    On disability for 7-8 years.  Working PT [employer] for [employer].  Sober 2 year 01/2019  Past psych meds:  Paxil 60 "too much" + wt gain,   duloxetine, Zoloft,  Viibryd 60 partial resp (trial after paroxetine) SE diarrhea. Trintellix 10 agitated Wellbutrin 75 (max tolerated),   Fluvoxamine 300 no better than 200 which seems to take edge off anxiety Clomipramine mania Auvelity 4 weeks NR  Abilify, Rexulti 2.  Vraylar irritable at 3 mg daily.  Latuda 120 fidgety Olanzapine 10 fatigue Risperdal 2 once  Buspar 30 BID stopped DT partial effect.  Xanax craving,   lithium  900, worse when he stopped lithium Lamotrigine NR CO dizzy  NAC 600 helped Modafinil 200 agitated Took lorazepam  in the past, Xanax less helpful  B severe OCD obsessions psych tx.  Review of Systems:  Review of Systems  Constitutional:  Positive for fatigue. Negative for unexpected weight change.  Cardiovascular:  Negative for chest pain and palpitations.  Gastrointestinal:  Positive for constipation. Negative for nausea.  Genitourinary:  Negative for urgency.  Neurological:  Negative for tremors.  Psychiatric/Behavioral:  Positive for dysphoric mood. Negative for agitation, behavioral problems, confusion, decreased concentration, hallucinations, self-injury, sleep disturbance and suicidal ideas. The patient is nervous/anxious. The patient is not hyperactive.     Medications: I have reviewed the patient's  current medications.  Current Outpatient Medications  Medication Sig Dispense Refill   amLODipine (NORVASC) 5 MG tablet TAKE 1 TABLET BY MOUTH EVERY DAY 90 tablet 3   atorvastatin (LIPITOR) 40 MG tablet TAKE 1 TABLET(40 MG) BY MOUTH DAILY 90 tablet 1   clonazePAM (KLONOPIN) 0.5 MG tablet TAKE 1 TABLET(0.5 MG) BY MOUTH TWICE DAILY AS NEEDED FOR ANXIETY 30 tablet 0   fluvoxaMINE (LUVOX) 100 MG tablet Take 1.5 tablets (150 mg total) by mouth at bedtime. 135 tablet 0   lithium carbonate 300 MG capsule TAKE 4 CAPSULES(1200 MG) BY MOUTH AT BEDTIME 360 capsule 0   QUEtiapine Fumarate (SEROQUEL XR) 150 MG 24 hr tablet Take 1 tablet (150 mg total) by mouth at bedtime. 30 tablet 0   No current facility-administered medications for this visit.    Medication Side Effects: sexual, weight  Allergies: No Known Allergies  Past Medical History:  Diagnosis Date   Anxiety    Bimalleolar fracture of left ankle    Depression     Heart murmur    Sleep apnea    uses CPAP nightly    Family History  Problem Relation Age of Onset   Cancer Maternal Grandfather    Colon cancer Neg Hx    Colon polyps Neg Hx    Esophageal cancer Neg Hx    Rectal cancer Neg Hx    Stomach cancer Neg Hx     Social History   Socioeconomic History   Marital status: Single    Spouse name: Not on file   Number of children: Not on file   Years of education: Not on file   Highest education level: Associate degree: occupational, Scientist, product/process development, or vocational program  Occupational History   Not on file  Tobacco Use   Smoking status: Every Day    Current packs/day: 1.50    Average packs/day: 1.5 packs/day for 47.7 years (71.5 ttl pk-yrs)    Types: Cigarettes    Start date: 03/22/1975   Smokeless tobacco: Never   Tobacco comments:    He reports he has quit 3 times and recently started back in 2012. Hsm   Vaping Use   Vaping status: Never Used  Substance and Sexual Activity   Alcohol use: Not Currently   Drug use: No   Sexual activity: Not on file  Other Topics Concern   Not on file  Social History Narrative   Not on file   Social Determinants of Health   Financial Resource Strain: Low Risk  (07/21/2022)   Overall Financial Resource Strain (CARDIA)    Difficulty of Paying Living Expenses: Not hard at all  Food Insecurity: No Food Insecurity (07/21/2022)   Hunger Vital Sign    Worried About Running Out of Food in the Last Year: Never true    Ran Out of Food in the Last Year: Never true  Transportation Needs: No Transportation Needs (07/21/2022)   PRAPARE - Administrator, Civil Service (Medical): No    Lack of Transportation (Non-Medical): No  Physical Activity: Inactive (07/21/2022)   Exercise Vital Sign    Days of Exercise per Week: 0 days    Minutes of Exercise per Session: 0 min  Stress: Stress Concern Present (07/21/2022)   [employer] of Occupational Health - Occupational Stress Questionnaire    Feeling of  Stress : Very much  Social Connections: Socially Isolated (07/21/2022)   Social Connection and Isolation Panel [NHANES]    Frequency of Communication with Friends and Family: Three  times a week    Frequency of Social Gatherings with Friends and Family: Twice a week    Attends Religious Services: Never    Database administrator or Organizations: No    Attends Engineer, structural: Not on file    Marital Status: Never married  Intimate Partner Violence: Not At Risk (07/29/2021)   Humiliation, Afraid, Rape, and Kick questionnaire    Fear of Current or Ex-Partner: No    Emotionally Abused: No    Physically Abused: No    Sexually Abused: No    Past Medical History, Surgical history, Social history, and Family history were reviewed and updated as appropriate.   Please see review of systems for further details on the patient's review from today.   Objective:   Physical Exam:  There were no vitals taken for this visit.  Physical Exam Constitutional:      General: He is not in acute distress.    Appearance: Normal appearance. He is well-developed.     Comments: Red face  Musculoskeletal:        General: No deformity.  Neurological:     Mental Status: He is alert and oriented to person, place, and time.     Motor: No tremor.     Coordination: Coordination normal.     Gait: Gait normal.  Psychiatric:        Attention and Perception: Attention normal. He is attentive.        Mood and Affect: Mood is anxious and depressed. Affect is not labile, blunt, angry or inappropriate.        Speech: Speech normal.        Behavior: Behavior normal. Behavior is not agitated or slowed.        Thought Content: Thought content normal. Thought content is not delusional. Thought content does not include homicidal or suicidal ideation. Thought content does not include suicidal plan.        Cognition and Memory: Cognition normal.        Judgment: Judgment normal.     Comments: Insight   Fair. Continued mood problems without much mania but irritable and anxious.  Dep worse than anxiety      Lab Review:     Component Value Date/Time   NA 141 03/01/2022 0904   K 4.0 03/01/2022 0904   CL 107 03/01/2022 0904   CO2 27 03/01/2022 0904   GLUCOSE 81 03/01/2022 0904   BUN 18 03/01/2022 0904   CREATININE 1.01 03/01/2022 0904   CALCIUM 9.6 03/01/2022 0904   PROT 7.6 06/17/2021 1350   ALBUMIN 4.7 06/17/2021 1350   AST 19 06/17/2021 1350   ALT 23 06/17/2021 1350   ALKPHOS 108 06/17/2021 1350   BILITOT 0.5 06/17/2021 1350       Component Value Date/Time   WBC 9.5 07/21/2022 1143   RBC 5.10 07/21/2022 1143   HGB 15.9 07/21/2022 1143   HGB 15.2 05/05/2010 1049   HCT 45.7 07/21/2022 1143   HCT 44.8 05/05/2010 1049   PLT 218.0 07/21/2022 1143   PLT 193 05/05/2010 1049   MCV 89.7 07/21/2022 1143   MCV 89.6 05/05/2010 1049   MCH 30.4 05/05/2010 1049   MCHC 34.8 07/21/2022 1143   RDW 13.4 07/21/2022 1143   RDW 13.1 05/05/2010 1049   LYMPHSABS 2.5 07/21/2022 1143   LYMPHSABS 1.8 05/05/2010 1049   MONOABS 0.8 07/21/2022 1143   MONOABS 0.4 05/05/2010 1049   EOSABS 0.6 07/21/2022 1143   EOSABS 0.2 05/05/2010  1049   BASOSABS 0.1 07/21/2022 1143   BASOSABS 0.0 05/05/2010 1049    Lithium Lvl  Date Value Ref Range Status  04/01/2022 0.9 0.6 - 1.2 mmol/L Final  04/01/22 lithium level 0.9 on 1200 mg daily  02/02/21 lithium level 0.6 on 900 mg daily  No results found for: "PHENYTOIN", "PHENOBARB", "VALPROATE", "CBMZ"   .res Assessment: Plan:     Sreekar was seen today for follow-up, depression and anxiety.  Diagnoses and all orders for this visit:  Bipolar II disorder (HCC) -     QUEtiapine Fumarate (SEROQUEL XR) 150 MG 24 hr tablet; Take 1 tablet (150 mg total) by mouth at bedtime.  GAD (generalized anxiety disorder)  Mixed obsessional thoughts and acts  Antipsychotic-induced akathisia  OSA (obstructive sleep apnea)  Low vitamin D level  Lithium  use  Low serum vitamin B12  Alcohol dependence in remission (HCC)     Chronic recurrent rumination with anxiety and urgency and sense of need and instablity with impatience ongoing with frequent phone calls between appts. but  but anhedonic depression and anxious.  Needs a lot of reassurance. Chronic worry and overwhelmed easily and misattributes sx as SE of meds leading to inadequate med trials.  Fear of meds.Falsely attributes his psych sx to the meds and then stops meds AMA.  False attribution of sx as SE. Discussed the use of a mood chart which may help address some of this part attribution and better delineate mood cycling.  Gave him a copy of the chart reviewed in detail with him as to how to keep it.  Anhedonic dep and anxiety.  Dep worse than anxiety.  Anxiety average.  Less restless.  Anxiety was better but not gone on fluvoxamine 150 mg .   he describe history other manic cycling symptoms including increased interest, increased goal-directed behaviors, increased urge to spend that will last for 6 weeks or so and then stop.  He has these cycles about 3 times a year.  This is suggestive of a bipolar predisposition which was confirmed by recent cycle into mania from clomipramine.  Continue CPAP use. New CPAP machine.  Discussed potential metabolic side effects associated with atypical antipsychotics, as well as potential risk for movement side effects. Advised pt to contact office if movement side effects occur.  He agrees.  Call if you have any problems with this.  He remains sober.  He wants to consider counseling to work through some personal issues as well as address specific symptoms like his anger and irritability. Rec local AA instead of online. clonazepam DT TR status 0.5 mg BIDprn No evidence of abuse.  Bc late 2023 mania with clomipramine increased lithium 1200  mg daily .   He cut it back to 900 mg daily.  He admits to feeling worse when he stopped the lithium.  Call  if sx get worse. Counseled patient regarding potential benefits, risks, and side effects of lithium to include potential risk of lithium affecting thyroid and renal function.  Discussed need for periodic lab monitoring to determine drug level and to assess for potential adverse effects.  Counseled patient regarding signs and symptoms of lithium toxicity and advised that they notify office immediately or seek urgent medical attention if experiencing these signs and symptoms.  Patient advised to contact office with any questions or concerns. 02/02/21  02/02/21 lithium level 0.6 on 900 mg daily, normal B12 and vitamin D 52 04/01/22 lithium level 0.9 on 1200 mg daily  02/02/21 lithium level 0.6  on 900 mg daily  Disc the off-label use of N-Acetylcysteine at 600 mg daily to help with mild cognitive problems.  It can be combined with a B-complex vitamin as the B-12 and folate have been shown to sometimes enhance the effect.  Partial benefit so continue NAC 1200 mg daily.  Continue vitamin D DT history level 30  Constipation management 1.  Lots of water 2.  Powdered fiber supplement such as MiraLAX, Citrucel, etc. preferably with a meal 3.  2 stool softeners a day 4.  Milk of magnesia or magnesium tablets if needed  Please see After Visit Summary for patient specific instructions. Changes: DC Auvelity DC latuda Start quetiapine XR 150 mg PM.   FU 8 weeks  Meredith Staggers, MD, DFAPA     Future Appointments  Date Time Provider Department Center  12/26/2022 10:30 AM Cottle, Steva Ready., MD [facility] None    No orders of the defined types were placed in this encounter.       -------------------------------

## 2022-11-15 ENCOUNTER — Telehealth: Payer: Self-pay | Admitting: Psychiatry

## 2022-11-15 NOTE — Telephone Encounter (Signed)
Please see message from patient and advise.  ?

## 2022-11-15 NOTE — Telephone Encounter (Signed)
Patient notified

## 2022-11-15 NOTE — Telephone Encounter (Signed)
It is sedating but it will get better.  Try to give it a week.  Then if it is a problem call.

## 2022-11-15 NOTE — Telephone Encounter (Signed)
Jonathon Walker called and LM at 4:10 to report that he started the Seroquel last night at 8pm.  He didn't wake up until 11:00am and didn't feel safe to drive until 2pm.  Feels very unstable.  Is the dose too strong, will this get better.  Please call to discuss.

## 2022-11-29 ENCOUNTER — Telehealth: Payer: Self-pay | Admitting: Psychiatry

## 2022-11-29 NOTE — Telephone Encounter (Signed)
Patient notified

## 2022-11-29 NOTE — Telephone Encounter (Signed)
It is probably the Seroquel.  Tell him to cut it in half.

## 2022-11-29 NOTE — Telephone Encounter (Signed)
Patient lvm at 10:38 stating that CC started him on Seroquel 150mg . Says that it is wiping him out throughout the day and he is taking as early as 2pm. He inquired if there needs to be an adjustment or should continue at current dosage. Ph: [phone] Appt 10/7

## 2022-11-29 NOTE — Telephone Encounter (Signed)
Last seen 8/26.   Stop Auvelity Stop [employer] quetiapine ER 150 mg in evening

## 2022-11-29 NOTE — Telephone Encounter (Signed)
Patient reporting he is wiped all from the Seroquel. He has taken it earlier and earlier, but says it still takes him a long time to get to sleep. He is able to stay asleep once he gets to sleep, but reports being wiped out until about 11:00 the next day. He did discontinue the [place] as instructed.  I called him at 2:15 and he was in bed. Questions if it is the depression vs medication.

## 2022-12-04 ENCOUNTER — Other Ambulatory Visit: Payer: Self-pay | Admitting: Adult Health

## 2022-12-04 DIAGNOSIS — I10 Essential (primary) hypertension: Secondary | ICD-10-CM

## 2022-12-06 ENCOUNTER — Other Ambulatory Visit: Payer: Self-pay | Admitting: Adult Health

## 2022-12-06 DIAGNOSIS — I10 Essential (primary) hypertension: Secondary | ICD-10-CM

## 2022-12-06 NOTE — Telephone Encounter (Signed)
Patient need to schedule cpe for more refills. 30 days sent in.

## 2022-12-20 ENCOUNTER — Other Ambulatory Visit: Payer: Self-pay | Admitting: Psychiatry

## 2022-12-20 DIAGNOSIS — F3181 Bipolar II disorder: Secondary | ICD-10-CM

## 2022-12-20 NOTE — Telephone Encounter (Signed)
Okay to rf 30 day supply; lv 11/14/22; lf 11/14/22; nv 12/26/22. No calls regarding Quetiapine.

## 2022-12-26 ENCOUNTER — Encounter: Payer: Self-pay | Admitting: Psychiatry

## 2022-12-26 ENCOUNTER — Ambulatory Visit: Payer: Medicare Other | Admitting: Psychiatry

## 2022-12-26 DIAGNOSIS — F3181 Bipolar II disorder: Secondary | ICD-10-CM | POA: Diagnosis not present

## 2022-12-26 DIAGNOSIS — F422 Mixed obsessional thoughts and acts: Secondary | ICD-10-CM | POA: Diagnosis not present

## 2022-12-26 DIAGNOSIS — F411 Generalized anxiety disorder: Secondary | ICD-10-CM | POA: Diagnosis not present

## 2022-12-26 DIAGNOSIS — G4733 Obstructive sleep apnea (adult) (pediatric): Secondary | ICD-10-CM | POA: Diagnosis not present

## 2022-12-26 DIAGNOSIS — Z79899 Other long term (current) drug therapy: Secondary | ICD-10-CM

## 2022-12-26 DIAGNOSIS — R7989 Other specified abnormal findings of blood chemistry: Secondary | ICD-10-CM

## 2022-12-26 DIAGNOSIS — E538 Deficiency of other specified B group vitamins: Secondary | ICD-10-CM

## 2022-12-26 NOTE — Progress Notes (Signed)
Jonathon Walker [id number] 11/09/1965 57 y.o.   Subjective:   Patient ID:  Jonathon Walker is a 57 y.o. (DOB 1965-06-27) male.  Chief Complaint:  Chief Complaint  Patient presents with   Follow-up   Depression   Anxiety   Sleeping Problem    Depression        Associated symptoms include fatigue.  Associated symptoms include no decreased concentration and no suicidal ideas.  Past medical history includes anxiety.   Anxiety Symptoms include nervous/anxious behavior. Patient reports no chest pain, confusion, decreased concentration, nausea, palpitations or suicidal ideas.       Jonathon Walker presents to the office today for follow-up of anxiety , depression, alcohol.    seen August 7.  His anxiety was unmanaged at Paxil 40 mg and olanzapine 5 mg so olanzapine was increased to 7.5 daily.  seen December 17, 2018.  His anxiety was unmanaged.  The following change was made: Trial 1-1/2 of the 7.5 mg tablets of olanzapine for anxi.  He is had a stay at [facility] for alcohol dependence since he was last here. Stayed 28 days and DC before Thanksgiving.  Very helpful.  Was having NM nighly before and they stopped since then.  Not drinking.  Involved in AA since then.  seen March 25, 2019.  The patient was doing better requested a reduction in olanzapine to 7.5 mg nightly to improve energy.  visit April 03, 2019.  He had remained sober and his anxiety was improved with sobriety.  He was having problems with low motivation and forgetfulness.  At the next refill it was decided olanzapine would be reduced from 7.5 to 5 mg daily. He called requesting this urgent appointment because of worsening symptoms associated with returning to work.  seen May 21, 2019.  He was doing relatively well.  The following was noted: Tolerating Wellbutrin and it seems to help. He didn't tolerate the increase. Anxiety is improved off alcohol.  Ok to reduce olanzapine to 2.5 mg daily for a month and  stop it.  Hopefully low motivation will improve.   He called back March 23 stating he was more depressed and having a harder time doing routine tasks and having suicidal thoughts.  Because of worsening depression after reducing the olanzapine he was encouraged to increase olanzapine back to 5 mg nightly. He called again March 24 stating he was really struggling but was scheduled for an appointment on March 26.  As of June 14, 2019 he reports the following: Every day a real hard struggle to get through the days.  More anxious and depressed equally. Worse 2 weeks.  Last Saturday so overwhelmed by how he feels he had SI.  Everything is hard. Initial benefit paroxetine has been lost.  Awakens stressed and tired. Adequate hours of sleep.  Will be major chore just to mow grass.  Still sober. SE sexual. Getting appt with CPAP doc. Changes made include: Increasing olanzapine back to 5 mg daily a couple of days prior to the appointment due to phone call and the addition of lithium 300 mg for a few days then increase to 600 mg nightly both to augment the antidepressant and because of suicidal thoughts.  July 15, 2019 appointment, the following is noted: Doing OK with both depression and anxiety.  Kind of like a top always going in mind.  Even when I sleep, when awakens mind still going.  NM nightly for years but less than before stopped drinking.  Smaller  and less severe ones that don't wake him.  Theme can't finish things.  Both depression and anxiety 3/10.  More productive.  Paces himself.  Can live with it.  Sleep 9 hours.  Initially olanzapine stopped the ruminating but it's back some.  Overall still benefit.  No SI and can't rmember why he had them before. Residual energy not great and some ruminating. He had some concerns about sexual side effects from Paxil but agreed that no med changes were necessary despite residual symptoms of depression and anxiety.  08/14/2019 appointment, the following is noted:   He scheduled urgently. Vaccinated. Angry easily and exhausted and everything is a chore.  Exhausted. Never had appt with CPAP company.  CPAP is 58 years old and on same settings as in the past.  CPAP machine is not working properly but when it did it was helpful for alertness and energy.  Disc need to call them to have it evaluated. To bed 10-7.  Don't feel rested in morning but used to feel rested when first started paroxetine. Plan: Increase Lithium continue to 900 mg daily for irritability  11/19/19 appt with the following noted: He increased to 900 mg and then reduced it but doesn't know why.  Reduced it to 300 nightly. Irritability much better.  Once anger since here with rage but not outward. Still on paroxetine 40, olanzapine 5, Wellbutrin 75 AM. Getting tired in afternoon and worrying about getting depressed but he's not depressed now.  Don't enjoy things like he used to.  Les interest and excitement. Appetite and sleep are normal.   Still adjusting without alcohol and socialization. Worrying about lack of sex drive with paroxetine. Can still ruminate on things until he gets some reassurance through talking with people. Plan: Continue low-dose lithium 300 mg daily Continue Paxil.  Did not feel well at 60 mg so we will continue 40 mg.  Likely to lose anxiety benefit if change it. Continue low-dose Wellbutrin 75  Tolerating Wellbutrin and it seems to help. He didn't tolerate the increase.   Continue olanzapine 5 mg  02/18/2020 appointment with following noted: Open to trying increase paroxetine again bc ruminating wears him out and gets fatigued and it wears him out though is 80% better vs before paroxetine. Doesn't remember trying higher paroxetine.  Ruminates on relationships or work.  Whenever he can talk about it with someone it helps tremendously but also realizes paroxetine helps. Anxiety affects dreams and NM too.  Up and down a little.  Anxiety drove depression before paroxetine.   Best med he's tried. SOBER 1 YEAR SE no problem.  Except gained 25# in 4 years.  No change in diet and exercise.  No increase in appetite and no food cravings.  Seeing PCP soon. Plan: Increase paroxetine trial to 60 for rumination.  Disc SE.  05/26/20 appt noted: No benefit increase paroxetine nor SE.  Wants to reduce back to 30 mg paroxetine and stop lithium bc it didn't seem to help.   4 days of Calm app has seemed to help rumination.  Does it in afternoon and before bed and it seemed to helps.  Doing a 10 min meditation and it helps.   Dep 4/10.  Anxiety 4/10. And a lot better the last few days.   Plan: Reduce lithium by 1 tablet per week.  Call if there is any suicidal thought. Starting April 1 reduce paroxetine to 40 mg daily. Continue low-dose Wellbutrin 75  Tolerating Wellbutrin and it seems to help.  He didn't tolerate the increase.   If doing OK will try taper as he suggested at next visit. Continue olanzapine 5 mg nightly it was helpful for depression and anxiety.  07/01/2020 appt with following noted: Moved appt up. Now says he understated depression level when he was here the last time. Restarted lithium bc felt more depressed without it. Reduced paroxetine to 40 mg daily. Lethargic.  Mind won't calm down.  Nonrestorative sleep but 8 hours.  Doesn't think sleep is deep enough.  Tense. Goes to [employer]. Started counseling. Wants to try something different with meds. Can have mixed sx for 6 weeks with increased interest in things and motivation and want to spend money but still feels depressed.  3 times in the last year. Plan: Increase  lithium back to 3 daily to see if depression is better and less mood cycling.  He admits to feeling worse when he stopped the lithium Continue the reduce paroxetine to 45 mg daily. DC Wellbutrin Lamotrigine trial 25 mg to 100 mg daily over 4 weeks. Continue olanzapine 5 mg nightly it was helpful for depression and anxiety.  08/26/2020  appointment with the following noted: Sad and down.  Classic depression sx and very fatigued.  Unusual to have classic depression bc usually is atypical.  No effect from lamotrigine. Wonders if atorvastatin is causing some confusion or depression bc read about it. Depressed for 3 mos. Gained 20#.  It's leveled off now. Olanzapine helped the rumination and anxiety.   Life is more depressing bc not seeing friends like it was bc stopped drinking.   No SE with lithium.  Unless dropping things. Sleep 1030 to 6 but doesn't sleep deep.  Not rested in AM Plan: Increase  lithium back to 3 daily to see if depression is better and less mood cycling.  He admits to feeling worse when he stopped the lithium Continue the reduce paroxetine to 40 mg daily bc it helped anxiety. Lamotrigine increase to 150 mg daily DT no benefit or SE Increase olanzapine10 mg nightly it was helpful for depression and anxiety.  Need more help with depression   09/11/2020 phone call: Pt stated he is foegetting things throughout the day and very unsteady.He works with tools and equptment and this is a problem.He stated he takes 6 Lamictal in the am. MD response: We made 3 med changes at the last visit including increasing lamotrigine to 150 mg daily, increasing olanzapine to 10 mg daily, and restarting lithium.  Any 1 of those changes could possibly cause some of the side effects.  We will have to make 1 change at a time to evaluate this.  Therefore reduce lamotrigine to 100 mg daily.  It may take a couple of weeks before he sees a difference.  11/13/2020 phone call: Patient lm stating he is currently taking Lamotrigine 150 mg. He has decreased it to 75 mg due to side effects per pt ( blurred vision, shaking and coordination issues). Patient stated he is discontinuing before his scheduled October appointment. MD response: Take the lamotrigine 75 mg for 2 weeks and he should be able to stop then without withdrawal.  If he gets shakey,  nervous then we need to reduce more slowly and let us know that.  Otherwise he can stop if he follows these directions.  11/30/2020 phone call: Pt called and advised he would like to taper off of the Olanzapine.  He has gained 8# since his last visit and 30# total since he  started taking the med.  He feels steady, not so much stress now, and would like to see if he can take Lorazepam instead.  Then talk to Dr. Jennelle Human in Oct when they meet. MD response: Reduce olanzapine to one half of the 10 mg tablet at night for 4 weeks and then stop it.  If he gets more depressed then get back in touch with [place] and we will try to do something about the weight gain that olanzapine can cause by using the alternative Lybalvi  12/07/2020 appointment with the following noted: Off lamotrigine .  Reduced olanzapine to 5 mg for a week.  On lithium 900 mg HS, paroxetine 40 Fatigue 10/10.  Dizziness resolved off lamotrigine.  Low fatigue and motivation.  "I want to want to do things".  Stopped drinking almost 18 mos and socially big adjustment.   Still invloved with AA and daily sponsor.  Working [employer].  Reece Levy Young helped. Thinks mild depression with little interest and motivation and no joy.  But not severe.  No SI. No anxiety and it's way better.  Life is more stable and helps.  Working 4 hours daily and occ extrea jobs. Uses new CPAP.  Stil drowsy and fatigue. Plan: Continue CPAP use. New CPAP machine. Trial modafinil 100-200 mg AM Lamotrigine off DT NR Reduced olanzapine to 5 mg daily for a week and well stop in 3 weeks DT wt gain and fatigue  01/20/2021 phone call: I called patient to see if I could get better information. He states he started modafinil a couple of weeks ago, though Rx was written for in September. He states he didn't feel well before taking the modafinil though. He just says he is agitated, irritable, and unstable feeling. When asked what he meant by unstable he stated feeling a "shell of myself".  He  has been put on the cancellation list.  MD response: It is likely the modafinil that is causing this problem.  But it may be corrected with a dose reduction.  I assume he is taking a whole tablet.  If that is the case time to reduce to 1/2 tablet in the morning.  If he is taking half a tablet tell him to reduce it to a quarter of a tablet.  And I agree we need to try to get him in as soon as possible. He reduced modafinil from 200 mg to 100 mg daily.  02/10/2021 appointment with the following noted: Current psych meds lithium 900 mg nightly, paroxetine 40 mg daily, olanzapine recently stopped, modafinil recently tried. Was really bad for awhile feeling depresssed and agitated and anxiety but better now including after reducing modafinil to 100 mg daily. Fatigue is better and in the middle now with energy, not good but not bad. Sleep is better.  CPAP doctor yesterday and they are gathering info pending. NAC is helping with memory. Still sober and committed to it. SE constipation  Plan: No med changes  03/10/2021 phone call from patient complaining of vague feelings of not feeling well and wanting the Paxil changed.  He was placed on the cancellation list.  03/18/2021 appointment urgently scheduled at his request: Remains on paroxetine 40 mg daily, lithium 1200 mg daily, modafinil 200 mg every morning. "Ruminating"  with sense of need to talk to someone.  Then feels better for awhile.  Mostly on relationships with family.  No one to talk to. Wonders need to chang ethe meds.   Don't socialize anymore since sober.  Does online AA everyday at noon. Lost 10# off olanzapine. Plan: Continue paroxetine 40 mg, lithium 1200 mg daily, Try stopping modafinil 100mg  AM to determine if it is helping or not. If not less anxious then start risperidone for rumination 1-2 mg HS  04/05/2021 appointment with the following noted: Stopped  modafinil and it helped reduce anxiety. Started risperidone 2 instead of  1 mg and was off balance so reduced risperidone to 1 mg HS. Only done this for 3 nights.  No SE with it. Anxiety is not close to as bad as originally.  Can be painful but not now. Still ruminating some "my whole adult life" but varies.  Anxiety can lead to depression. 6-7/10 with 10 good on depression with some problems with ambition. Plan: Continue trial risperidone for rumination 1 mg HS  04/07/2021 TC: Several phone calls: Called 2 days after the visit complaining of itching from the lithium and wanting to come off of it.  He was warned he was more emotionally distressed when he reduced the lithium in the past but he wanted to pursue it anyway.  He was continuing risperidone 1 mg nightly but wanting to reduce that also but was warned he would almost certainly have worsening mood severity and anxiety problems based on past experience.  He was cautioned against reducing lithium below 900 mg daily and risperidone below 1 mg nightly.  He agreed on 04/09/2021.  04/09/2021 he called again and several phone calls ensued thereafter.  He insisted on stopping lithium and risperidone despite warnings that his mood stability and anxiety would worsen. He was prescribed Depakote to take the place  04/20/2021 appointment with the following noted: He stopped risperidone and did not take the Depakote.  He is taking lithium 900 mg nightly and paroxetine 40 mg daily. Stopped risperidone bc feeling agitated and unstable and blamed it.  Still feels the same off of it. Says itching 85% better with less lithium.  Itching for a couple of mos.  Doesn't think anxiety noticeably worse after reducing lithium to 900 mg daily. Not dizzy.  Clumsy with hands. Anxious but not painful like it was before Paxil but tense at the end of the day.  Initially felt normal for the first time in 20 years when first started paxil but not as good now. Not sadness and hasn't had much of it. Started therapy with doctor on demand. Ruminating on  "fear of not being fully present" and worries about little things like appts pending.   Afraid of working FT because gets overwhelmed bc "I take it home with me" Plan : Continue trial risperidone but increase to rumination 1.5 mg HS Continue paroxetine 40 and lithium 600 mg   06/01/2021 appointment with the following noted: Haven't seen benefit with risperidone 1.5 mg daily. Not in pain but feels disconnected and not myself.  Has felt like this off and on for years. Level of angst all day but not real high unless triggered.  Still has the rumination. Initially a lot of benefit from paroxetine Not markedly deprssed and not sad.  07/14/21 appt noted:  Doesn't feel much different with switch to fluvoxamine except anxiety well controlled. But still low energy, motivation and not as productive as he wants to be.  Not markedly sad.  Asks about Vraylar. Not much interest or enjoyment. Don't sleep that great and don't relax that great. On fluvoxamine 200, lithium 900, off risperidone. Plan: Anxiety controlled by fluvoxamine 200 Potentiate Vraylar 1.5 mg daily  07/27/2021  phone call complaining of ongoing depression.  Vraylar increased to 3 mg daily. 07/29/2021 phone call complaining of cost of Vraylar.  Was given samples. 08/20/2021 phone call complaining of fatigue.  Stated he stopped the Vraylar because it made him agitated.  It was recommended there be no further med changes because he is only been off the Vraylar for 1 week.  08/30/2021 appointment noted: Vraylar 1.5 mg didn't do much.  Increased to 3 mg and then felt irritable ans stopped it about 3rd week of May.  Better now. Still extremely tired and feels physically sick with normal workup. Thinks his mind wears him out.  Racing negative thoughts all the time. Asks about Ativan. Always nervous and anxious.  Trouble discerning anxiety from depression. Plan: Anxiety was controlled by fluvoxamine 200 but now he thinks it is worse. So increase 250  mg daily  10/18/21 appt noted: No results with increase fluvoxamine.  No SE. Anxiety is still worse than depression.  Thinks it causes somatic sx like the flu.  Exhausted. GI. Poor sleep and concentration. Asks about lorazepam.   Got checked out by PCP without findings. Plan: Reduce lithium to 2 capsules daily to see if physical symptoms like fatigue etc. are better Increase fluvoxamine to 1 tablet in the morning and 2 tablets in the evening to reduce anxiety Plan Trial reduction of  lithium 900  to 600 mg daily to see if somatic sx are better like fatigue.   01/06/22 appt noted" Everything is about the same with med changes. CC anxiety and fatigue.  Sx started with severe depression in 20s and got better but never resolved. Thinks anxiety is causing fatigue.  Has to nap about 1 pm.  Easily fatigued.  No particular worry other than how he feels and himself.. Plan: Reduce fluvoxamine gradually to 150 mg daily Start clomipramine 25 mg nightly for 5 days and increase to 75 mg HS  02/07/22 TC:  Pt lvm that the clomiprame 25 mg has been taking the full dose for two weeks now. He is experiencing mania. Overspending etc    MD resp:  Increase the lithium back to 3 daily.  Stop the clomipramine.  Resume the risperidone until these symptoms clear up.  Call us back next week and let us know how it is going.     02/16/22 TC:   Pt was just calling to give you an update.He stated he is still 50 percent manic but feels better.He is currently taking   lithium carbonate 300 MG 3 daily risperiDONE 3 mg 1/2 tab daily fluvoxamine 100 MG tablet 1/2 tablet in the AM and 1 tablet at night On 11/20 changes were made,you had him stop clomipramine                     and increase lithium to 900 mg daily.  02/16/22 MD: check lithium level.  02/24/22 tC complaining of depression. MD :  He has had recent mood swings from manic to depressed by his report.  Lithium level is low at 0.5 on 600 mg daily.  I think  the most helpful thing for his mood would be to increase lithium back to 900 mg daily.      03/07/22 TC: pt reported increasing his Luvox to 200 mg daily.  03/08/22 appt noted: Multiple phone calls since here.   Clomipramine triggered mania like he's never had before.  Increased over the top looking for a house, wanting to invent things, bought a  shed and fish tank abruptly.  Left his job which was a good thing.  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  I feel a lot better about it.  Has disability and will do PT work.  I'll be OK. Exhausted and can't be on his feet all the time.   Sleep is different with EMA and ready to go but watches TV awhile and then goes back to sleep.  Always a late sleeper all his life.   Still feeling some depression and he increased the luvox to 200 and it helped. Thinks parents are Autistic and don't respond emotionally.  M cannot understand feelings and he doesn't get any support.   Plan: Bc recent mania with clomipramine increase lithium 1200  mg daily .    03/29/2022 appointment urgently due to recent mania: Psych meds: fluvoxamine 200, lithium 1200, risperidone 1.5 mg HS Exhausted until the last couple of days. SE tremor Has not had any water today and is lightheaded.   Mood a little better and anxiety better than it was.  No mania. Some underlying weight in mood that only responded to paroxetine and clomipramine. Asks about Wellbutrin for energy. Sleep normal 8-9 hours now.  Except last night. Goes to [facility] groups and support group and AA. Sober Reduce risperidone to 1 mg to see if energy better  05/25/22 appt noted: Reduced lithium about 10 days ago bc thought he might be getting too much bc read on the internet.  No changes since reducing it. More dep for a couple of mos which is unusual.  In bed a lot and more isolated and not normally what I have.  Not real sad but unmotivated and no energy to do things.    Start therapist Friday. No sig sadness.   Irritable and short with people over little things.  Kind of started after stopped drinking.  Worse.  Sleep on and off.  Erratic.  Looks for ward to night so can escape into sleep.   Anxious underlying for 30 years.  Hard to sit and relax and just watch TV.  Tends to ruminate. Asks to try Gaba. Now lethargy is worse than chronic anxiety Reducing risperidone made no changes good or bad. DC risperidone 1 mg HS Add Latuda 1/2 tablet for 1 week then 1 tablet in the the evening  07/06/22 appt :  Increased Latuda up to 100 mg .  No clear SE. Exhausted mentally and it makes his body tired.  Working 10-2 and then comes home and lays in bed a couple of hours.  Not a deep sleep.  Never feels fully rested.  Sleep HS 10-7.   Normal appetitie.   Still some sadness and irritability.  Anxiety doesn't seem better but is having less mood swings and mania.  Anxiety wears him down.   Plan: Increase  Latuda 120 mg in the evening with food  07/21/22 appt noted: Trouble keeping mood chart.  If I talk and I'm heard then feels better for hours but then damn stopped up.  Almost like an obsessive thing to talk for relief. No clear effect of increase Latuda except reduced som cognitive anxiety but not physical anxiety. Says there was med he took before paxil that gave him instant relief and wonders about retrying it.  Doesn't know which med. Paxil had best effect when it worked for a period of time. Exhausted but fidget when sits.   Dep and anxious moderate.  09/01/22 appt noted: Meds  clonazepam 0.5 mg BID prn, fluvoxamine 200 mg daily, lithium 900 HS, latuda 120 mg daily, vit D3 This type of dep since November after mania is low energy, anhedonia, low appetite, a lot of time in bed, "textbook" depresssion, lack of interest but not debilitating sadness.   Tired of it.  Dep is worse than anxiety and used to be the other way around.  Not as irritable. In past dep was anxious and disconnected and wasn't as tired.  Had to  leave work bc can't get himself out of bed.   Toss and turn in bed fidgety in bed, feels like has to move but no energy to walk.  Fidgety for 3 mos. Clonazepam takes the edge off but feels a higher dose is needed.  Plan: Changes: Reduce Latuda to 60 mg daily. Start samples of [place] 1 in the AM for dep that is TRD  10/20/22 appt noted: Meds clonazepam 0.5 mg BID prn, fluvoxamine 200 mg daily, lithium 900 HS, latuda 60 mg daily, vit D3, out of Auvelity after 2 weeks. Less restless but still has it 6/10 after reduced Latuda.  Thinks he had it before eLatuda but not sure. Takes an hour to get to sleep.   No change in dep from this visit to last visit. Did have positive effects from [place] but ? SE conc.  Did help energy Lately more dep than anxiety.  Noted ASA helps anxiety. Couple times situational anger but no mood swings. Plan: Changes: Reduce fluvoxamine to 1 and 1/2 tablet daily to see if restlessness better Reduce Latuda from 60 to 40 mg daily to see if restlessness better Retry Auvelity 1 in the AM for 7 days then 1 twice daily  11/14/22 appt noted: Twitching and restlessness better.   Still some teeth grinding but better Auvelity didn't help mood.  Ongoing classic lethargy, poor appetite.  Can't exercise bc depression.  Little tasks overwhelming.   Meds:  clonazepam 0.5 mg BID prn, fluvoxamine 150 mg daily, lithium 900 HS, latuda 40 mg daily, vit D3,  Auvelity BID SE constipation and bruxism. Sleep fair and best and most relaxed 730-930 AM.  To bed 10 pm.   12/26/22 appt noted: More relaxed if not underpressure but not working and needs to. Not much motivation or energy in AM.  Doesn't notice the effects of Seroquel for many hours, but it is relaxing med. For last 7 years hard time with motivation and capacity.   Happened years ago and then it resolved after 7 years with Wellbutrin  On disability for 7-8 years.  Working PT [employer] for alcohol.  Sober 2 year  01/2019  Past psych meds:  Paxil 60 "too much" + wt gain,   duloxetine, Zoloft,  Viibryd 60 partial resp (trial after paroxetine) SE diarrhea. Trintellix 10 agitated Wellbutrin 75 (max tolerated),   Fluvoxamine 300 no better than 200 which seems to take edge off anxiety Clomipramine mania Auvelity 4 weeks NR  Abilify, Rexulti 2. Vraylar irritable at 3 mg daily.  Latuda 120 fidgety Olanzapine 10 fatigue Risperdal 2 once Seroquel XR 150  Buspar 30 BID stopped DT partial effect.  Xanax craving,   lithium  900, worse when he stopped lithium Lamotrigine NR CO dizzy  NAC 600 helped Modafinil 200 agitated Took lorazepam  in the past, Xanax less helpful  B severe OCD obsessions psych tx.  Review of Systems:  Review of Systems  Constitutional:  Positive for fatigue. Negative for unexpected weight change.  Cardiovascular:  Negative for chest pain and palpitations.  Gastrointestinal:  Positive for constipation. Negative for nausea.  Neurological:  Negative for tremors.  Psychiatric/Behavioral:  Positive for depression and dysphoric mood. Negative for agitation, behavioral problems, confusion, decreased concentration, hallucinations, self-injury, sleep disturbance and suicidal ideas. The patient is nervous/anxious. The patient is not hyperactive.     Medications: I have reviewed the patient's current medications.  Current Outpatient Medications  Medication Sig Dispense Refill   amLODipine (NORVASC) 5 MG tablet TAKE 1 TABLET BY MOUTH EVERY DAY 30 tablet 0   atorvastatin (LIPITOR) 40 MG tablet TAKE 1 TABLET(40 MG) BY MOUTH DAILY 90 tablet 1   clonazePAM (KLONOPIN) 0.5 MG tablet TAKE 1 TABLET(0.5 MG) BY MOUTH TWICE DAILY AS NEEDED FOR ANXIETY 30 tablet 0   fluvoxaMINE (LUVOX) 100 MG tablet Take 1.5 tablets (150 mg total) by mouth at bedtime. 135 tablet 0   lithium carbonate 300 MG capsule TAKE 4 CAPSULES(1200 MG) BY MOUTH AT BEDTIME (Patient taking differently: Take 900 mg by mouth at  bedtime.) 360 capsule 0   QUEtiapine Fumarate (SEROQUEL XR) 150 MG 24 hr tablet TAKE 1 TABLET(150 MG) BY MOUTH AT BEDTIME (Patient taking differently: Take 75 mg by mouth at bedtime.) 30 tablet 0   No current facility-administered medications for this visit.    Medication Side Effects: sexual, weight  Allergies: No Known Allergies  Past Medical History:  Diagnosis Date   Anxiety    Bimalleolar fracture of left ankle    Depression    Heart murmur    Sleep apnea    uses CPAP nightly    Family History  Problem Relation Age of Onset   Cancer Maternal Grandfather    Colon cancer Neg Hx    Colon polyps Neg Hx    Esophageal cancer Neg Hx    Rectal cancer Neg Hx    Stomach cancer Neg Hx     Social History   Socioeconomic History   Marital status: Single    Spouse name: Not on file   Number of children: Not on file   Years of education: Not on file   Highest education level: Associate degree: occupational, Scientist, product/process development, or vocational program  Occupational History   Not on file  Tobacco Use   Smoking status: Every Day    Current packs/day: 1.50    Average packs/day: 1.5 packs/day for 47.8 years (71.6 ttl pk-yrs)    Types: Cigarettes    Start date: 03/22/1975   Smokeless tobacco: Never   Tobacco comments:    He reports he has quit 3 times and recently started back in 2012. Hsm   Vaping Use   Vaping status: Never Used  Substance and Sexual Activity   Alcohol use: Not Currently   Drug use: No   Sexual activity: Not on file  Other Topics Concern   Not on file  Social History Narrative   Not on file   Social Determinants of Health   Financial Resource Strain: Low Risk  (07/21/2022)   Overall Financial Resource Strain (CARDIA)    Difficulty of Paying Living Expenses: Not hard at all  Food Insecurity: No Food Insecurity (07/21/2022)   Hunger Vital Sign    Worried About Running Out of Food in the Last Year: Never true    Ran Out of Food in the Last Year: Never true   Transportation Needs: No Transportation Needs (07/21/2022)   PRAPARE - Administrator, Civil Service (Medical): No    Lack of Transportation (Non-Medical):  No  Physical Activity: Unknown (07/21/2022)   Exercise Vital Sign    Days of Exercise per Week: 0 days    Minutes of Exercise per Session: Not on file  Recent Concern: Physical Activity - Inactive (07/21/2022)   Exercise Vital Sign    Days of Exercise per Week: 0 days    Minutes of Exercise per Session: 0 min  Stress: Stress Concern Present (07/21/2022)   [employer] of Occupational Health - Occupational Stress Questionnaire    Feeling of Stress : Very much  Social Connections: Socially Isolated (07/21/2022)   Social Connection and Isolation Panel [NHANES]    Frequency of Communication with Friends and Family: Three times a week    Frequency of Social Gatherings with Friends and Family: Twice a week    Attends Religious Services: Never    Database administrator or Organizations: No    Attends Engineer, structural: Not on file    Marital Status: Never married  Intimate Partner Violence: Not At Risk (07/29/2021)   Humiliation, Afraid, Rape, and Kick questionnaire    Fear of Current or Ex-Partner: No    Emotionally Abused: No    Physically Abused: No    Sexually Abused: No    Past Medical History, Surgical history, Social history, and Family history were reviewed and updated as appropriate.   Please see review of systems for further details on the patient's review from today.   Objective:   Physical Exam:  There were no vitals taken for this visit.  Physical Exam Constitutional:      General: He is not in acute distress.    Appearance: Normal appearance. He is well-developed.     Comments: Red face  Musculoskeletal:        General: No deformity.  Neurological:     Mental Status: He is alert and oriented to person, place, and time.     Motor: No tremor.     Coordination: Coordination normal.      Gait: Gait normal.  Psychiatric:        Attention and Perception: Attention normal. He is attentive.        Mood and Affect: Mood is anxious and depressed. Affect is not labile, blunt, angry or inappropriate.        Speech: Speech normal.        Behavior: Behavior normal. Behavior is not agitated or slowed.        Thought Content: Thought content normal. Thought content is not delusional. Thought content does not include homicidal or suicidal ideation. Thought content does not include suicidal plan.        Cognition and Memory: Cognition normal.        Judgment: Judgment normal.     Comments: Insight  Fair. Continued mood problems without much mania but irritable and anxious.  Dep worse than anxiety      Lab Review:     Component Value Date/Time   NA 141 03/01/2022 0904   K 4.0 03/01/2022 0904   CL 107 03/01/2022 0904   CO2 27 03/01/2022 0904   GLUCOSE 81 03/01/2022 0904   BUN 18 03/01/2022 0904   CREATININE 1.01 03/01/2022 0904   CALCIUM 9.6 03/01/2022 0904   PROT 7.6 06/17/2021 1350   ALBUMIN 4.7 06/17/2021 1350   AST 19 06/17/2021 1350   ALT 23 06/17/2021 1350   ALKPHOS 108 06/17/2021 1350   BILITOT 0.5 06/17/2021 1350       Component Value Date/Time   WBC  9.5 07/21/2022 1143   RBC 5.10 07/21/2022 1143   HGB 15.9 07/21/2022 1143   HGB 15.2 05/05/2010 1049   HCT 45.7 07/21/2022 1143   HCT 44.8 05/05/2010 1049   PLT 218.0 07/21/2022 1143   PLT 193 05/05/2010 1049   MCV 89.7 07/21/2022 1143   MCV 89.6 05/05/2010 1049   MCH 30.4 05/05/2010 1049   MCHC 34.8 07/21/2022 1143   RDW 13.4 07/21/2022 1143   RDW 13.1 05/05/2010 1049   LYMPHSABS 2.5 07/21/2022 1143   LYMPHSABS 1.8 05/05/2010 1049   MONOABS 0.8 07/21/2022 1143   MONOABS 0.4 05/05/2010 1049   EOSABS 0.6 07/21/2022 1143   EOSABS 0.2 05/05/2010 1049   BASOSABS 0.1 07/21/2022 1143   BASOSABS 0.0 05/05/2010 1049    Lithium Lvl  Date Value Ref Range Status  04/01/2022 0.9 0.6 - 1.2 mmol/L Final  04/01/22  lithium level 0.9 on 1200 mg daily  02/02/21 lithium level 0.6 on 900 mg daily  No results found for: "PHENYTOIN", "PHENOBARB", "VALPROATE", "CBMZ"   .res Assessment: Plan:     Ahlijah was seen today for follow-up, depression, anxiety and sleeping problem.  Diagnoses and all orders for this visit:  Bipolar II disorder (HCC)  GAD (generalized anxiety disorder)  Mixed obsessional thoughts and acts  OSA (obstructive sleep apnea)  Lithium use  Low vitamin D level  Low serum vitamin B12      Chronic recurrent rumination with anxiety and urgency and sense of need and instablity with impatience ongoing with frequent phone calls between appts. but  but anhedonic depression and anxious.  Needs a lot of reassurance. Chronic worry and overwhelmed easily and misattributes sx as SE of meds leading to inadequate med trials.  Fear of meds.Falsely attributes his psych sx to the meds and then stops meds AMA.  False attribution of sx as SE. Discussed the use of a mood chart which may help address some of this part attribution and better delineate mood cycling.  Gave him a copy of the chart reviewed in detail with him as to how to keep it.  Anhedonic dep and anxiety.  Dep worse than anxiety.  Anxiety average.  Less restless.  Anxiety was better but not gone on fluvoxamine 150 mg .   he describe history other manic cycling symptoms including increased interest, increased goal-directed behaviors, increased urge to spend that will last for 6 weeks or so and then stop.  He has these cycles about 3 times a year.  This is suggestive of a bipolar predisposition which was confirmed by recent cycle into mania from clomipramine.  Continue CPAP use. New CPAP machine.  Discussed potential metabolic side effects associated with atypical antipsychotics, as well as potential risk for movement side effects. Advised pt to contact office if movement side effects occur.  He agrees.  Call if you have any problems  with this.  He remains sober.  He wants to consider counseling to work through some personal issues as well as address specific symptoms like his anger and irritability. Rec local AA instead of online. clonazepam DT TR status 0.5 mg BIDprn No evidence of abuse.  Bc late 2023 mania with clomipramine increased lithium 1200  mg daily .   He cut it back to 900 mg daily.  He admits to feeling worse when he stopped the lithium.  Call if sx get worse. Counseled patient regarding potential benefits, risks, and side effects of lithium to include potential risk of lithium affecting thyroid and renal function.  Discussed need for periodic lab monitoring to determine drug level and to assess for potential adverse effects.  Counseled patient regarding signs and symptoms of lithium toxicity and advised that they notify office immediately or seek urgent medical attention if experiencing these signs and symptoms.  Patient advised to contact office with any questions or concerns. 02/02/21  02/02/21 lithium level 0.6 on 900 mg daily, normal B12 and vitamin D 52 04/01/22 lithium level 0.9 on 1200 mg daily  02/02/21 lithium level 0.6 on 900 mg daily  Disc the off-label use of N-Acetylcysteine at 600 mg daily to help with mild cognitive problems.  It can be combined with a B-complex vitamin as the B-12 and folate have been shown to sometimes enhance the effect.  Partial benefit so continue NAC 1200 mg daily.  Continue vitamin D DT history level 30  Constipation management 1.  Lots of water 2.  Powdered fiber supplement such as MiraLAX, Citrucel, etc. preferably with a meal 3.  2 stool softeners a day 4.  Milk of magnesia or magnesium tablets if needed  Please see After Visit Summary for patient specific instructions. Changes none:  Continue 1/2 quetiapine XR 150 mg PM.  This is helping him to feel more calm in the morning than he has felt in a very long time.  However he thinks now he is a little too subdued  but does not want to do reduce the dose any further.  Does not tolerate a higher dose because it is too sedating. FU 8 weeks  Meredith Staggers, MD, DFAPA     Future Appointments  Date Time Provider Department Center  02/08/2023 10:30 AM Cottle, Steva Ready., MD [facility] None  03/23/2023 11:00 AM Cottle, Steva Ready., MD [facility] None    No orders of the defined types were placed in this encounter.       -------------------------------

## 2023-01-09 ENCOUNTER — Telehealth: Payer: Self-pay | Admitting: Psychiatry

## 2023-01-09 NOTE — Telephone Encounter (Signed)
Patient taking 75 mg of Seroquel and sleeping great at night, but also sleeping a lot during the day. He reports he doesn't want to be around people, is content to be at home not doing anything. He rates depression as 7/10, anxiety 5/10. Reports anxiety is better since starting Seroquel. He also takes: Fluvoxamine 150 mg at bedtime Lithium 900 mg at bedtime, though 1200 is prescribed. No longer taking clonazepam.  I've talked to him several times and he sounds brighter than he has in the past. [place] Spaniel he would never be able to get a job with current issues.

## 2023-01-09 NOTE — Telephone Encounter (Signed)
There are a limited number of options left to [place] and it is important that each med be tried at his most effective dose.  Seroquel is FDA approved for depression in doses of 150 mg and higher.  The low dose he is currently on is enough to help anxiety and his sleep but probably not enough to help depression.  We may need to go as high as 200 or 300 mg a day.  He is probably fearful of being too sedated at higher doses but this medicine does not necessarily make people more sleepy at higher doses.  I think she got to go up to the full quetiapine XR 150 mg daily and give that a try until his appointment with me.

## 2023-01-09 NOTE — Telephone Encounter (Signed)
Patient lvm stating there have been no noticeable changes since on Seroquel. He states he is non functioning with no desire to do anything. He questions if he should remain on the medication until seen in the office.  Appointment 02/08/23 Contact [phone]

## 2023-01-10 NOTE — Telephone Encounter (Signed)
Patient notified of recommendations. 

## 2023-01-10 NOTE — Telephone Encounter (Signed)
LVM to RC 

## 2023-01-21 ENCOUNTER — Other Ambulatory Visit: Payer: Self-pay | Admitting: Adult Health

## 2023-01-21 DIAGNOSIS — I10 Essential (primary) hypertension: Secondary | ICD-10-CM

## 2023-01-24 ENCOUNTER — Other Ambulatory Visit: Payer: Self-pay | Admitting: Adult Health

## 2023-01-24 DIAGNOSIS — I10 Essential (primary) hypertension: Secondary | ICD-10-CM

## 2023-01-24 NOTE — Telephone Encounter (Signed)
Pt needs a CPE for further refills 

## 2023-02-08 ENCOUNTER — Telehealth: Payer: Self-pay | Admitting: Adult Health

## 2023-02-08 ENCOUNTER — Ambulatory Visit: Payer: Medicare Other | Admitting: Psychiatry

## 2023-02-08 ENCOUNTER — Encounter: Payer: Self-pay | Admitting: Psychiatry

## 2023-02-08 DIAGNOSIS — R7989 Other specified abnormal findings of blood chemistry: Secondary | ICD-10-CM

## 2023-02-08 DIAGNOSIS — F411 Generalized anxiety disorder: Secondary | ICD-10-CM | POA: Diagnosis not present

## 2023-02-08 DIAGNOSIS — F3181 Bipolar II disorder: Secondary | ICD-10-CM

## 2023-02-08 DIAGNOSIS — Z79899 Other long term (current) drug therapy: Secondary | ICD-10-CM

## 2023-02-08 DIAGNOSIS — F1021 Alcohol dependence, in remission: Secondary | ICD-10-CM

## 2023-02-08 DIAGNOSIS — G4733 Obstructive sleep apnea (adult) (pediatric): Secondary | ICD-10-CM

## 2023-02-08 DIAGNOSIS — F324 Major depressive disorder, single episode, in partial remission: Secondary | ICD-10-CM

## 2023-02-08 DIAGNOSIS — F422 Mixed obsessional thoughts and acts: Secondary | ICD-10-CM

## 2023-02-08 DIAGNOSIS — E538 Deficiency of other specified B group vitamins: Secondary | ICD-10-CM

## 2023-02-08 MED ORDER — LITHIUM CARBONATE 300 MG PO CAPS
900.0000 mg | ORAL_CAPSULE | Freq: Every evening | ORAL | 0 refills | Status: DC
Start: 2023-02-08 — End: 2023-06-15

## 2023-02-08 MED ORDER — QUETIAPINE FUMARATE ER 200 MG PO TB24
200.0000 mg | ORAL_TABLET | Freq: Every day | ORAL | 1 refills | Status: DC
Start: 2023-02-08 — End: 2023-03-23

## 2023-02-08 MED ORDER — FLUVOXAMINE MALEATE 100 MG PO TABS
150.0000 mg | ORAL_TABLET | Freq: Every day | ORAL | 0 refills | Status: DC
Start: 2023-02-08 — End: 2023-06-15

## 2023-02-08 NOTE — Telephone Encounter (Signed)
Patient notified that he is suppose to take 1000 units  daily  of Vit D  and verbalized understanding.

## 2023-02-08 NOTE — Telephone Encounter (Signed)
Pt is calling and suppose to be taking vit d3 and he does not know what strength or mg he suppose to be taking

## 2023-02-08 NOTE — Patient Instructions (Signed)
Increase Seroquel XR to 200 mg in afternoon. Check lithium level in the morning

## 2023-02-08 NOTE — Progress Notes (Signed)
Jonathon Walker [id number] 1966-02-23 57 y.o.   Subjective:   Patient ID:  Jonathon Walker is a 57 y.o. (DOB Apr 12, 1965) male.  Chief Complaint:  Chief Complaint  Patient presents with   Follow-up   Depression   Anxiety   Fatigue    Depression        Associated symptoms include fatigue.  Associated symptoms include no decreased concentration and no suicidal ideas.  Past medical history includes anxiety.   Anxiety Symptoms include nervous/anxious behavior. Patient reports no confusion, decreased concentration, nausea, palpitations or suicidal ideas.       Jonathon Walker presents to the office today for follow-up of anxiety , depression, alcohol.    seen August 7.  His anxiety was unmanaged at Paxil 40 mg and olanzapine 5 mg so olanzapine was increased to 7.5 daily.  seen December 17, 2018.  His anxiety was unmanaged.  The following change was made: Trial 1-1/2 of the 7.5 mg tablets of olanzapine for anxi.  He is had a stay at [facility] for alcohol dependence since he was last here. Stayed 28 days and DC before Thanksgiving.  Very helpful.  Was having NM nighly before and they stopped since then.  Not drinking.  Involved in AA since then.  seen March 25, 2019.  The patient was doing better requested a reduction in olanzapine to 7.5 mg nightly to improve energy.  visit April 03, 2019.  He had remained sober and his anxiety was improved with sobriety.  He was having problems with low motivation and forgetfulness.  At the next refill it was decided olanzapine would Walker reduced from 7.5 to 5 mg daily. He called requesting this urgent appointment because of worsening symptoms associated with returning to work.  seen May 21, 2019.  He was doing relatively well.  The following was noted: Tolerating Wellbutrin and it seems to help. He didn't tolerate the increase. Anxiety is improved off alcohol.  Ok to reduce olanzapine to 2.5 mg daily for a month and stop it.  Hopefully low  motivation will improve.   He called back March 23 stating he was more depressed and having a harder time doing routine tasks and having suicidal thoughts.  Because of worsening depression after reducing the olanzapine he was encouraged to increase olanzapine back to 5 mg nightly. He called again March 24 stating he was really struggling but was scheduled for an appointment on March 26.  As of June 14, 2019 he reports the following: Every day a real hard struggle to get through the days.  More anxious and depressed equally. Worse 2 weeks.  Last Saturday so overwhelmed by how he feels he had SI.  Everything is hard. Initial benefit paroxetine has been lost.  Awakens stressed and tired. Adequate hours of sleep.  Will Walker major chore just to mow grass.  Still sober. SE sexual. Getting appt with CPAP doc. Changes made include: Increasing olanzapine back to 5 mg daily a couple of days prior to the appointment due to phone call and the addition of lithium 300 mg for a few days then increase to 600 mg nightly both to augment the antidepressant and because of suicidal thoughts.  July 15, 2019 appointment, the following is noted: Doing OK with both depression and anxiety.  Kind of like a top always going in mind.  Even when I sleep, when awakens mind still going.  NM nightly for years but less than before stopped drinking.  Smaller and less severe  ones that don't wake him.  Theme can't finish things.  Both depression and anxiety 3/10.  More productive.  Paces himself.  Can live with it.  Sleep 9 hours.  Initially olanzapine stopped the ruminating but it's back some.  Overall still benefit.  No SI and can't rmember why he had them before. Residual energy not great and some ruminating. He had some concerns about sexual side effects from Paxil but agreed that no med changes were necessary despite residual symptoms of depression and anxiety.  08/14/2019 appointment, the following is noted:  He scheduled  urgently. Vaccinated. Angry easily and exhausted and everything is a chore.  Exhausted. Never had appt with CPAP company.  CPAP is 58 years old and on same settings as in the past.  CPAP machine is not working properly but when it did it was helpful for alertness and energy.  Disc need to call them to have it evaluated. To bed 10-7.  Don't feel rested in morning but used to feel rested when first started paroxetine. Plan: Increase Lithium continue to 900 mg daily for irritability  11/19/19 appt with the following noted: He increased to 900 mg and then reduced it but doesn't know why.  Reduced it to 300 nightly. Irritability much better.  Once anger since here with rage but not outward. Still on paroxetine 40, olanzapine 5, Wellbutrin 75 AM. Getting tired in afternoon and worrying about getting depressed but he's not depressed now.  Don't enjoy things like he used to.  Les interest and excitement. Appetite and sleep are normal.   Still adjusting without alcohol and socialization. Worrying about lack of sex drive with paroxetine. Can still ruminate on things until he gets some reassurance through talking with people. Plan: Continue low-dose lithium 300 mg daily Continue Paxil.  Did not feel well at 60 mg so we will continue 40 mg.  Likely to lose anxiety benefit if change it. Continue low-dose Wellbutrin 75  Tolerating Wellbutrin and it seems to help. He didn't tolerate the increase.   Continue olanzapine 5 mg  02/18/2020 appointment with following noted: Open to trying increase paroxetine again bc ruminating wears him out and gets fatigued and it wears him out though is 80% better vs before paroxetine. Doesn't remember trying higher paroxetine.  Ruminates on relationships or work.  Whenever he can talk about it with someone it helps tremendously but also realizes paroxetine helps. Anxiety affects dreams and NM too.  Up and down a little.  Anxiety drove depression before paroxetine.  Best med he's  tried. SOBER 1 YEAR SE no problem.  Except gained 25# in 4 years.  No change in diet and exercise.  No increase in appetite and no food cravings.  Seeing PCP soon. Plan: Increase paroxetine trial to 60 for rumination.  Disc SE.  05/26/20 appt noted: No benefit increase paroxetine nor SE.  Wants to reduce back to 30 mg paroxetine and stop lithium bc it didn't seem to help.   4 days of Calm app has seemed to help rumination.  Does it in afternoon and before bed and it seemed to helps.  Doing a 10 min meditation and it helps.   Dep 4/10.  Anxiety 4/10. And a lot better the last few days.   Plan: Reduce lithium by 1 tablet per week.  Call if there is any suicidal thought. Starting April 1 reduce paroxetine to 40 mg daily. Continue low-dose Wellbutrin 75  Tolerating Wellbutrin and it seems to help. He didn't tolerate  the increase.   If doing OK will try taper as he suggested at next visit. Continue olanzapine 5 mg nightly it was helpful for depression and anxiety.  07/01/2020 appt with following noted: Moved appt up. Now says he understated depression level when he was here the last time. Restarted lithium bc felt more depressed without it. Reduced paroxetine to 40 mg daily. Lethargic.  Mind won't calm down.  Nonrestorative sleep but 8 hours.  Doesn't think sleep is deep enough.  Tense. Goes to [employer]. Started counseling. Wants to try something different with meds. Can have mixed sx for 6 weeks with increased interest in things and motivation and want to spend money but still feels depressed.  3 times in the last year. Plan: Increase  lithium back to 3 daily to see if depression is better and less mood cycling.  He admits to feeling worse when he stopped the lithium Continue the reduce paroxetine to 45 mg daily. DC Wellbutrin Lamotrigine trial 25 mg to 100 mg daily over 4 weeks. Continue olanzapine 5 mg nightly it was helpful for depression and anxiety.  08/26/2020 appointment with  the following noted: Sad and down.  Classic depression sx and very fatigued.  Unusual to have classic depression bc usually is atypical.  No effect from lamotrigine. Wonders if atorvastatin is causing some confusion or depression bc read about it. Depressed for 3 mos. Gained 20#.  It's leveled off now. Olanzapine helped the rumination and anxiety.   Life is more depressing bc not seeing friends like it was bc stopped drinking.   No SE with lithium.  Unless dropping things. Sleep 1030 to 6 but doesn't sleep deep.  Not rested in AM Plan: Increase  lithium back to 3 daily to see if depression is better and less mood cycling.  He admits to feeling worse when he stopped the lithium Continue the reduce paroxetine to 40 mg daily bc it helped anxiety. Lamotrigine increase to 150 mg daily DT no benefit or SE Increase olanzapine10 mg nightly it was helpful for depression and anxiety.  Need more help with depression   09/11/2020 phone call: Pt stated he is foegetting things throughout the day and very unsteady.He works with tools and equptment and this is a problem.He stated he takes 6 Lamictal in the am. MD response: We made 3 med changes at the last visit including increasing lamotrigine to 150 mg daily, increasing olanzapine to 10 mg daily, and restarting lithium.  Any 1 of those changes could possibly cause some of the side effects.  We will have to make 1 change at a time to evaluate this.  Therefore reduce lamotrigine to 100 mg daily.  It may take a couple of weeks before he sees a difference.  11/13/2020 phone call: Patient lm stating he is currently taking Lamotrigine 150 mg. He has decreased it to 75 mg due to side effects per pt ( blurred vision, shaking and coordination issues). Patient stated he is discontinuing before his scheduled October appointment. MD response: Take the lamotrigine 75 mg for 2 weeks and he should Walker able to stop then without withdrawal.  If he gets shakey, nervous then we need  to reduce more slowly and let us know that.  Otherwise he can stop if he follows these directions.  11/30/2020 phone call: Pt called and advised he would like to taper off of the Olanzapine.  He has gained 8# since his last visit and 30# total since he started taking the  med.  He feels steady, not so much stress now, and would like to see if he can take Lorazepam instead.  Then talk to Dr. Jennelle Human in Oct when they meet. MD response: Reduce olanzapine to one half of the 10 mg tablet at night for 4 weeks and then stop it.  If he gets more depressed then get back in touch with [place] and we will try to do something about the weight gain that olanzapine can cause by using the alternative Lybalvi  12/07/2020 appointment with the following noted: Off lamotrigine .  Reduced olanzapine to 5 mg for a week.  On lithium 900 mg HS, paroxetine 40 Fatigue 10/10.  Dizziness resolved off lamotrigine.  Low fatigue and motivation.  "I want to want to do things".  Stopped drinking almost 18 mos and socially big adjustment.   Still invloved with AA and daily sponsor.  Working [employer].  Reece Levy Young helped. Thinks mild depression with little interest and motivation and no joy.  But not severe.  No SI. No anxiety and it's way better.  Life is more stable and helps.  Working 4 hours daily and occ extrea jobs. Uses new CPAP.  Stil drowsy and fatigue. Plan: Continue CPAP use. New CPAP machine. Trial modafinil 100-200 mg AM Lamotrigine off DT NR Reduced olanzapine to 5 mg daily for a week and well stop in 3 weeks DT wt gain and fatigue  01/20/2021 phone call: I called patient to see if I could get better information. He states he started modafinil a couple of weeks ago, though Rx was written for in September. He states he didn't feel well before taking the modafinil though. He just says he is agitated, irritable, and unstable feeling. When asked what he meant by unstable he stated feeling a "shell of myself".  He has been put on the  cancellation list.  MD response: It is likely the modafinil that is causing this problem.  But it may Walker corrected with a dose reduction.  I assume he is taking a whole tablet.  If that is the case time to reduce to 1/2 tablet in the morning.  If he is taking half a tablet tell him to reduce it to a quarter of a tablet.  And I agree we need to try to get him in as soon as possible. He reduced modafinil from 200 mg to 100 mg daily.  02/10/2021 appointment with the following noted: Current psych meds lithium 900 mg nightly, paroxetine 40 mg daily, olanzapine recently stopped, modafinil recently tried. Was really bad for awhile feeling depresssed and agitated and anxiety but better now including after reducing modafinil to 100 mg daily. Fatigue is better and in the middle now with energy, not good but not bad. Sleep is better.  CPAP doctor yesterday and they are gathering info pending. NAC is helping with memory. Still sober and committed to it. SE constipation  Plan: No med changes  03/10/2021 phone call from patient complaining of vague feelings of not feeling well and wanting the Paxil changed.  He was placed on the cancellation list.  03/18/2021 appointment urgently scheduled at his request: Remains on paroxetine 40 mg daily, lithium 1200 mg daily, modafinil 200 mg every morning. "Ruminating"  with sense of need to talk to someone.  Then feels better for awhile.  Mostly on relationships with family.  No one to talk to. Wonders need to chang ethe meds.   Don't socialize anymore since sober.  Does online AA  everyday at noon. Lost 10# off olanzapine. Plan: Continue paroxetine 40 mg, lithium 1200 mg daily, Try stopping modafinil 100mg  AM to determine if it is helping or not. If not less anxious then start risperidone for rumination 1-2 mg HS  04/05/2021 appointment with the following noted: Stopped  modafinil and it helped reduce anxiety. Started risperidone 2 instead of 1 mg and was off  balance so reduced risperidone to 1 mg HS. Only done this for 3 nights.  No SE with it. Anxiety is not close to as bad as originally.  Can Walker painful but not now. Still ruminating some "my whole adult life" but varies.  Anxiety can lead to depression. 6-7/10 with 10 good on depression with some problems with ambition. Plan: Continue trial risperidone for rumination 1 mg HS  04/07/2021 TC: Several phone calls: Called 2 days after the visit complaining of itching from the lithium and wanting to come off of it.  He was warned he was more emotionally distressed when he reduced the lithium in the past but he wanted to pursue it anyway.  He was continuing risperidone 1 mg nightly but wanting to reduce that also but was warned he would almost certainly have worsening mood severity and anxiety problems based on past experience.  He was cautioned against reducing lithium below 900 mg daily and risperidone below 1 mg nightly.  He agreed on 04/09/2021.  04/09/2021 he called again and several phone calls ensued thereafter.  He insisted on stopping lithium and risperidone despite warnings that his mood stability and anxiety would worsen. He was prescribed Depakote to take the place  04/20/2021 appointment with the following noted: He stopped risperidone and did not take the Depakote.  He is taking lithium 900 mg nightly and paroxetine 40 mg daily. Stopped risperidone bc feeling agitated and unstable and blamed it.  Still feels the same off of it. Says itching 85% better with less lithium.  Itching for a couple of mos.  Doesn't think anxiety noticeably worse after reducing lithium to 900 mg daily. Not dizzy.  Clumsy with hands. Anxious but not painful like it was before Paxil but tense at the end of the day.  Initially felt normal for the first time in 20 years when first started paxil but not as good now. Not sadness and hasn't had much of it. Started therapy with doctor on demand. Ruminating on "fear of not  being fully present" and worries about little things like appts pending.   Afraid of working FT because gets overwhelmed bc "I take it home with me" Plan : Continue trial risperidone but increase to rumination 1.5 mg HS Continue paroxetine 40 and lithium 600 mg   06/01/2021 appointment with the following noted: Haven't seen benefit with risperidone 1.5 mg daily. Not in pain but feels disconnected and not myself.  Has felt like this off and on for years. Level of angst all day but not real high unless triggered.  Still has the rumination. Initially a lot of benefit from paroxetine Not markedly deprssed and not sad.  07/14/21 appt noted:  Doesn't feel much different with switch to fluvoxamine except anxiety well controlled. But still low energy, motivation and not as productive as he wants to Walker.  Not markedly sad.  Asks about Vraylar. Not much interest or enjoyment. Don't sleep that great and don't relax that great. On fluvoxamine 200, lithium 900, off risperidone. Plan: Anxiety controlled by fluvoxamine 200 Potentiate Vraylar 1.5 mg daily  07/27/2021 phone call complaining  of ongoing depression.  Vraylar increased to 3 mg daily. 07/29/2021 phone call complaining of cost of Vraylar.  Was given samples. 08/20/2021 phone call complaining of fatigue.  Stated he stopped the Vraylar because it made him agitated.  It was recommended there Walker no further med changes because he is only been off the Vraylar for 1 week.  08/30/2021 appointment noted: Vraylar 1.5 mg didn't do much.  Increased to 3 mg and then felt irritable ans stopped it about 3rd week of May.  Better now. Still extremely tired and feels physically sick with normal workup. Thinks his mind wears him out.  Racing negative thoughts all the time. Asks about Ativan. Always nervous and anxious.  Trouble discerning anxiety from depression. Plan: Anxiety was controlled by fluvoxamine 200 but now he thinks it is worse. So increase 250 mg  daily  10/18/21 appt noted: No results with increase fluvoxamine.  No SE. Anxiety is still worse than depression.  Thinks it causes somatic sx like the flu.  Exhausted. GI. Poor sleep and concentration. Asks about lorazepam.   Got checked out by PCP without findings. Plan: Reduce lithium to 2 capsules daily to see if physical symptoms like fatigue etc. are better Increase fluvoxamine to 1 tablet in the morning and 2 tablets in the evening to reduce anxiety Plan Trial reduction of  lithium 900  to 600 mg daily to see if somatic sx are better like fatigue.   01/06/22 appt noted" Everything is about the same with med changes. CC anxiety and fatigue.  Sx started with severe depression in 20s and got better but never resolved. Thinks anxiety is causing fatigue.  Has to nap about 1 pm.  Easily fatigued.  No particular worry other than how he feels and himself.. Plan: Reduce fluvoxamine gradually to 150 mg daily Start clomipramine 25 mg nightly for 5 days and increase to 75 mg HS  02/07/22 TC:  Pt lvm that the clomiprame 25 mg has been taking the full dose for two weeks now. He is experiencing mania. Overspending etc    MD resp:  Increase the lithium back to 3 daily.  Stop the clomipramine.  Resume the risperidone until these symptoms clear up.  Call us back next week and let us know how it is going.     02/16/22 TC:   Pt was just calling to give you an update.He stated he is still 50 percent manic but feels better.He is currently taking   lithium carbonate 300 MG 3 daily risperiDONE 3 mg 1/2 tab daily fluvoxamine 100 MG tablet 1/2 tablet in the AM and 1 tablet at night On 11/20 changes were made,you had him stop clomipramine                     and increase lithium to 900 mg daily.  02/16/22 MD: check lithium level.  02/24/22 tC complaining of depression. MD :  He has had recent mood swings from manic to depressed by his report.  Lithium level is low at 0.5 on 600 mg daily.  I think the  most helpful thing for his mood would Walker to increase lithium back to 900 mg daily.      03/07/22 TC: pt reported increasing his Luvox to 200 mg daily.  03/08/22 appt noted: Multiple phone calls since here.   Clomipramine triggered mania like he's never had before.  Increased over the top looking for a house, wanting to invent things, bought a shed and fish  tank abruptly.  Left his job which was a good thing.  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  I feel a lot better about it.  Has disability and will do PT work.  I'll Walker OK. Exhausted and can't Walker on his feet all the time.   Sleep is different with EMA and ready to go but watches TV awhile and then goes back to sleep.  Always a late sleeper all his life.   Still feeling some depression and he increased the luvox to 200 and it helped. Thinks parents are Autistic and don't respond emotionally.  M cannot understand feelings and he doesn't get any support.   Plan: Bc recent mania with clomipramine increase lithium 1200  mg daily .    03/29/2022 appointment urgently due to recent mania: Psych meds: fluvoxamine 200, lithium 1200, risperidone 1.5 mg HS Exhausted until the last couple of days. SE tremor Has not had any water today and is lightheaded.   Mood a little better and anxiety better than it was.  No mania. Some underlying weight in mood that only responded to paroxetine and clomipramine. Asks about Wellbutrin for energy. Sleep normal 8-9 hours now.  Except last night. Goes to [facility] groups and support group and AA. Sober Reduce risperidone to 1 mg to see if energy better  05/25/22 appt noted: Reduced lithium about 10 days ago bc thought he might Walker getting too much bc read on the internet.  No changes since reducing it. More dep for a couple of mos which is unusual.  In bed a lot and more isolated and not normally what I have.  Not real sad but unmotivated and no energy to do things.    Start therapist Friday. No sig sadness.   Irritable and short with people over little things.  Kind of started after stopped drinking.  Worse.  Sleep on and off.  Erratic.  Looks for ward to night so can escape into sleep.   Anxious underlying for 30 years.  Hard to sit and relax and just watch TV.  Tends to ruminate. Asks to try Gaba. Now lethargy is worse than chronic anxiety Reducing risperidone made no changes good or bad. DC risperidone 1 mg HS Add Latuda 1/2 tablet for 1 week then 1 tablet in the the evening  07/06/22 appt :  Increased Latuda up to 100 mg .  No clear SE. Exhausted mentally and it makes his body tired.  Working 10-2 and then comes home and lays in bed a couple of hours.  Not a deep sleep.  Never feels fully rested.  Sleep HS 10-7.   Normal appetitie.   Still some sadness and irritability.  Anxiety doesn't seem better but is having less mood swings and mania.  Anxiety wears him down.   Plan: Increase  Latuda 120 mg in the evening with food  07/21/22 appt noted: Trouble keeping mood chart.  If I talk and I'm heard then feels better for hours but then damn stopped up.  Almost like an obsessive thing to talk for relief. No clear effect of increase Latuda except reduced som cognitive anxiety but not physical anxiety. Says there was med he took before paxil that gave him instant relief and wonders about retrying it.  Doesn't know which med. Paxil had best effect when it worked for a period of time. Exhausted but fidget when sits.   Dep and anxious moderate.  09/01/22 appt noted: Meds clonazepam 0.5 mg  BID prn, fluvoxamine 200 mg daily, lithium 900 HS, latuda 120 mg daily, vit D3 This type of dep since November after mania is low energy, anhedonia, low appetite, a lot of time in bed, "textbook" depresssion, lack of interest but not debilitating sadness.   Tired of it.  Dep is worse than anxiety and used to Walker the other way around.  Not as irritable. In past dep was anxious and disconnected and wasn't as tired.  Had to  leave work bc can't get himself out of bed.   Toss and turn in bed fidgety in bed, feels like has to move but no energy to walk.  Fidgety for 3 mos. Clonazepam takes the edge off but feels a higher dose is needed.  Plan: Changes: Reduce Latuda to 60 mg daily. Start samples of [place] 1 in the AM for dep that is TRD  10/20/22 appt noted: Meds clonazepam 0.5 mg BID prn, fluvoxamine 200 mg daily, lithium 900 HS, latuda 60 mg daily, vit D3, out of [place] after 2 weeks. Less restless but still has it 6/10 after reduced Latuda.  Thinks he had it before eLatuda but not sure. Takes an hour to get to sleep.   No change in dep from this visit to last visit. Did have positive effects from [place] but ? SE conc.  Did help energy Lately more dep than anxiety.  Noted ASA helps anxiety. Couple times situational anger but no mood swings. Plan: Changes: Reduce fluvoxamine to 1 and 1/2 tablet daily to see if restlessness better Reduce Latuda from 60 to 40 mg daily to see if restlessness better Retry Auvelity 1 in the AM for 7 days then 1 twice daily  11/14/22 appt noted: Twitching and restlessness better.   Still some teeth grinding but better Auvelity didn't help mood.  Ongoing classic lethargy, poor appetite.  Can't exercise bc depression.  Little tasks overwhelming.    clonazepam 0.5 mg BID prn, fluvoxamine 150 mg daily, lithium 900 HS, latuda 40 mg daily, vit D3,  Auvelity BID SE constipation and bruxism. Sleep fair and best and most relaxed 730-930 AM.  To bed 10 pm.   12/26/22 appt noted: More relaxed if not underpressure but not working and needs to. Not much motivation or energy in AM.  Doesn't notice the effects of Seroquel for many hours, but it is relaxing med. For last 7 years hard time with motivation and capacity.   Happened years ago and then it resolved after 7 years with Wellbutrin Plan: Changes none:  Continue 1/2 quetiapine XR 150 mg PM.  This is helping him to feel more calm in the  morning than he has felt in a very long time.  However he thinks now he is a little too subdued but does not want to do reduce the dose any further.  Does not tolerate a higher dose because it is too sedating.  01/09/23 TC:  Patient taking 75 mg of Seroquel and sleeping great at night, but also sleeping a lot during the day. He reports he doesn't want to Walker around people, is content to Walker at home not doing anything. He rates depression as 7/10, anxiety 5/10. Reports anxiety is better since starting Seroquel. He also takes: Fluvoxamine 150 mg at bedtime Lithium 900 mg at bedtime, though 1200 is prescribed. No longer taking clonazepam.    MD resp:  There are a limited number of options left to [place] and it is important that each med Walker tried at his  most effective dose.  Seroquel is FDA approved for depression in doses of 150 mg and higher.  The low dose he is currently on is enough to help anxiety and his sleep but probably not enough to help depression.  We may need to go as high as 200 or 300 mg a day.  He is probably fearful of being too sedated at higher doses but this medicine does not necessarily make people more sleepy at higher doses.  I think she got to go up to the full quetiapine XR 150 mg daily and give that a try until his appointment with me.      02/08/23 appt noted: Psych med: no Clonazepam 0.5 mg twice daily as needed anxiety, fluvoxamine 150 nightly, lithium 900 nightly, quetiapine XR 150 at 3 pm Less am drowsiness taking Seroquel in the afternoon. Chronic fatigue unchanged.  Rare days without it.  Can do one task daily.  Can work 1 and 1/2 hours daily.  Naps about 2 pm.   Seroquel helps sleep and anxiety.   Easily overwhelmed if multiple tasks.   Feels much better in the AM than in years.  By noon is worn out.      On disability for 7-8 years.  Working PT [employer] for alcohol.  Sober 2 year 01/2019  Past psych meds:   Paxil 60 "too much" + wt gain,   duloxetine,  Zoloft,  Viibryd 60 partial resp (trial after paroxetine) SE diarrhea. Trintellix 10 agitated Wellbutrin 75 (max tolerated),   Fluvoxamine 300 no better than 200 which seems to take edge off anxiety Clomipramine mania Auvelity 4 weeks NR  Abilify, Rexulti 2. Vraylar irritable at 3 mg daily.  Latuda 120 fidgety Olanzapine 10 fatigue Risperdal 2 once Seroquel XR 150  Buspar 30 BID stopped DT partial effect.  Xanax craving,   lithium  900, worse when he stopped lithium Lamotrigine NR CO dizzy  NAC 600 helped Modafinil 200 agitated Took lorazepam  in the past, Xanax less helpful  B severe OCD obsessions psych tx.  Review of Systems:  Review of Systems  Constitutional:  Positive for fatigue. Negative for unexpected weight change.  Cardiovascular:  Negative for palpitations.  Gastrointestinal:  Positive for constipation. Negative for nausea.  Neurological:  Negative for tremors.  Psychiatric/Behavioral:  Positive for dysphoric mood. Negative for agitation, behavioral problems, confusion, decreased concentration, hallucinations, self-injury, sleep disturbance and suicidal ideas. The patient is nervous/anxious. The patient is not hyperactive.     Medications: I have reviewed the patient's current medications.  Current Outpatient Medications  Medication Sig Dispense Refill   amLODipine (NORVASC) 5 MG tablet TAKE 1 TABLET BY MOUTH EVERY DAY 30 tablet 0   atorvastatin (LIPITOR) 40 MG tablet TAKE 1 TABLET(40 MG) BY MOUTH DAILY 90 tablet 1   fluvoxaMINE (LUVOX) 100 MG tablet Take 1.5 tablets (150 mg total) by mouth at bedtime. 135 tablet 0   lithium carbonate 300 MG capsule Take 3 capsules (900 mg total) by mouth at bedtime. 270 capsule 0   QUEtiapine Fumarate (SEROQUEL XR) 200 MG 24 hr tablet Take 1 tablet (200 mg total) by mouth at bedtime. 30 tablet 1   No current facility-administered medications for this visit.    Medication Side Effects: sexual, weight  Allergies: No Known  Allergies  Past Medical History:  Diagnosis Date   Anxiety    Bimalleolar fracture of left ankle    Depression    Heart murmur    Sleep apnea  uses CPAP nightly    Family History  Problem Relation Age of Onset   Cancer Maternal Grandfather    Colon cancer Neg Hx    Colon polyps Neg Hx    Esophageal cancer Neg Hx    Rectal cancer Neg Hx    Stomach cancer Neg Hx     Social History   Socioeconomic History   Marital status: Single    Spouse name: Not on file   Number of children: Not on file   Years of education: Not on file   Highest education level: Associate degree: occupational, Scientist, product/process development, or vocational program  Occupational History   Not on file  Tobacco Use   Smoking status: Every Day    Current packs/day: 1.50    Average packs/day: 1.5 packs/day for 47.9 years (71.8 ttl pk-yrs)    Types: Cigarettes    Start date: 03/22/1975   Smokeless tobacco: Never   Tobacco comments:    He reports he has quit 3 times and recently started back in 2012. Hsm   Vaping Use   Vaping status: Never Used  Substance and Sexual Activity   Alcohol use: Not Currently   Drug use: No   Sexual activity: Not on file  Other Topics Concern   Not on file  Social History Narrative   Not on file   Social Determinants of Health   Financial Resource Strain: Low Risk  (07/21/2022)   Overall Financial Resource Strain (CARDIA)    Difficulty of Paying Living Expenses: Not hard at all  Food Insecurity: No Food Insecurity (07/21/2022)   Hunger Vital Sign    Worried About Running Out of Food in the Last Year: Never true    Ran Out of Food in the Last Year: Never true  Transportation Needs: No Transportation Needs (07/21/2022)   PRAPARE - Administrator, Civil Service (Medical): No    Lack of Transportation (Non-Medical): No  Physical Activity: Inactive (07/21/2022)   Exercise Vital Sign    Days of Exercise per Week: 0 days    Minutes of Exercise per Session: 0 min  Stress: Stress  Concern Present (07/21/2022)   [employer] of Occupational Health - Occupational Stress Questionnaire    Feeling of Stress : Very much  Social Connections: Socially Isolated (07/21/2022)   Social Connection and Isolation Panel [NHANES]    Frequency of Communication with Friends and Family: Three times a week    Frequency of Social Gatherings with Friends and Family: Twice a week    Attends Religious Services: Never    Database administrator or Organizations: No    Attends Engineer, structural: Not on file    Marital Status: Never married  Intimate Partner Violence: Not At Risk (07/29/2021)   Humiliation, Afraid, Rape, and Kick questionnaire    Fear of Current or Ex-Partner: No    Emotionally Abused: No    Physically Abused: No    Sexually Abused: No    Past Medical History, Surgical history, Social history, and Family history were reviewed and updated as appropriate.   Please see review of systems for further details on the patient's review from today.   Objective:   Physical Exam:  There were no vitals taken for this visit.  Physical Exam Constitutional:      General: He is not in acute distress.    Appearance: Normal appearance. He is well-developed.     Comments: Red face  Musculoskeletal:  General: No deformity.  Neurological:     Mental Status: He is alert and oriented to person, place, and time.     Motor: No tremor.     Coordination: Coordination normal.     Gait: Gait normal.  Psychiatric:        Attention and Perception: Attention normal. He is attentive.        Mood and Affect: Mood is anxious and depressed. Affect is not labile, blunt, angry or inappropriate.        Speech: Speech normal.        Behavior: Behavior normal. Behavior is not agitated or slowed.        Thought Content: Thought content normal. Thought content is not delusional. Thought content does not include homicidal or suicidal ideation. Thought content does not include suicidal  plan.        Cognition and Memory: Cognition normal.        Judgment: Judgment normal.     Comments: Insight  Fair. Continued mood problems without much mania but irritable and anxious.  Dep worse than anxiety Seroquel helps anxiety a good bit.      Lab Review:     Component Value Date/Time   NA 141 03/01/2022 0904   K 4.0 03/01/2022 0904   CL 107 03/01/2022 0904   CO2 27 03/01/2022 0904   GLUCOSE 81 03/01/2022 0904   BUN 18 03/01/2022 0904   CREATININE 1.01 03/01/2022 0904   CALCIUM 9.6 03/01/2022 0904   PROT 7.6 06/17/2021 1350   ALBUMIN 4.7 06/17/2021 1350   AST 19 06/17/2021 1350   ALT 23 06/17/2021 1350   ALKPHOS 108 06/17/2021 1350   BILITOT 0.5 06/17/2021 1350       Component Value Date/Time   WBC 9.5 07/21/2022 1143   RBC 5.10 07/21/2022 1143   HGB 15.9 07/21/2022 1143   HGB 15.2 05/05/2010 1049   HCT 45.7 07/21/2022 1143   HCT 44.8 05/05/2010 1049   PLT 218.0 07/21/2022 1143   PLT 193 05/05/2010 1049   MCV 89.7 07/21/2022 1143   MCV 89.6 05/05/2010 1049   MCH 30.4 05/05/2010 1049   MCHC 34.8 07/21/2022 1143   RDW 13.4 07/21/2022 1143   RDW 13.1 05/05/2010 1049   LYMPHSABS 2.5 07/21/2022 1143   LYMPHSABS 1.8 05/05/2010 1049   MONOABS 0.8 07/21/2022 1143   MONOABS 0.4 05/05/2010 1049   EOSABS 0.6 07/21/2022 1143   EOSABS 0.2 05/05/2010 1049   BASOSABS 0.1 07/21/2022 1143   BASOSABS 0.0 05/05/2010 1049    Lithium Lvl  Date Value Ref Range Status  04/01/2022 0.9 0.6 - 1.2 mmol/L Final  04/01/22 lithium level 0.9 on 1200 mg daily  02/02/21 lithium level 0.6 on 900 mg daily  No results found for: "PHENYTOIN", "PHENOBARB", "VALPROATE", "CBMZ"   .res Assessment: Plan:     Jacobson was seen today for follow-up, depression, anxiety and fatigue.  Diagnoses and all orders for this visit:  Bipolar II disorder (HCC) -     QUEtiapine Fumarate (SEROQUEL XR) 200 MG 24 hr tablet; Take 1 tablet (200 mg total) by mouth at bedtime. -     Lithium level  GAD  (generalized anxiety disorder) -     fluvoxaMINE (LUVOX) 100 MG tablet; Take 1.5 tablets (150 mg total) by mouth at bedtime.  Mixed obsessional thoughts and acts -     fluvoxaMINE (LUVOX) 100 MG tablet; Take 1.5 tablets (150 mg total) by mouth at bedtime.  OSA (obstructive sleep apnea)  Lithium  use  Low vitamin D level  Low serum vitamin B12  Alcohol dependence in remission (HCC)  Major depressive disorder with single episode, in partial remission (HCC) -     lithium carbonate 300 MG capsule; Take 3 capsules (900 mg total) by mouth at bedtime.   Chronic recurrent rumination with anxiety and urgency and sense of need and instablity with impatience ongoing with frequent phone calls between appts. but  but anhedonic depression and anxious.  Needs a lot of reassurance. Chronic worry and overwhelmed easily and misattributes sx as SE of meds leading to inadequate med trials.  Fear of meds.Falsely attributes his psych sx to the meds and then stops meds AMA.  False attribution of sx as SE. Discussed the use of a mood chart which may help address some of this part attribution and better delineate mood cycling.  Gave him a copy of the chart reviewed in detail with him as to how to keep it.  Anhedonic dep and anxiety.  Dep worse than anxiety.  Anxiety average.  Less restless.  Anxiety was better but not gone on fluvoxamine 150 mg .   he describe history other manic cycling symptoms including increased interest, increased goal-directed behaviors, increased urge to spend that will last for 6 weeks or so and then stop.  He has these cycles about 3 times a year.  This is suggestive of a bipolar predisposition which was confirmed by recent cycle into mania from clomipramine.  Continue CPAP use. New CPAP machine.  Discussed potential metabolic side effects associated with atypical antipsychotics, as well as potential risk for movement side effects. Advised pt to contact office if movement side effects  occur.  He agrees.  Call if you have any problems with this.  He remains sober.  He wants to consider counseling to work through some personal issues as well as address specific symptoms like his anger and irritability. Rec local AA instead of online. clonazepam DT TR status 0.5 mg BIDprn No evidence of abuse.  Bc late 2023 mania with clomipramine increased lithium 1200  mg daily .   He cut it back to 900 mg daily.  He admits to feeling worse when he stopped the lithium.  Call if sx get worse. Counseled patient regarding potential benefits, risks, and side effects of lithium to include potential risk of lithium affecting thyroid and renal function.  Discussed need for periodic lab monitoring to determine drug level and to assess for potential adverse effects.  Counseled patient regarding signs and symptoms of lithium toxicity and advised that they notify office immediately or seek urgent medical attention if experiencing these signs and symptoms.  Patient advised to contact office with any questions or concerns. 02/02/21  02/02/21 lithium level 0.6 on 900 mg daily, normal B12 and vitamin D 52 04/01/22 lithium level 0.9 on 1200 mg daily  02/02/21 lithium level 0.6 on 900 mg daily Check lithium level  Disc the off-label use of N-Acetylcysteine at 600 mg daily to help with mild cognitive problems.  It can Walker combined with a B-complex vitamin as the B-12 and folate have been shown to sometimes enhance the effect.  Partial benefit so continue NAC 1200 mg daily.  Continue vitamin D DT history level 30  Constipation management 1.  Lots of water 2.  Powdered fiber supplement such as MiraLAX, Citrucel, etc. preferably with a meal 3.  2 stool softeners a day 4.  Milk of magnesia or magnesium tablets if needed  Try to stay as  active as possible.  Please see After Visit Summary for patient specific instructions. Increase for TRD quetiapine XR 200 mg PM.  This is helping him to feel more calm in the  morning than he has felt in a very long time.  However he thinks now he is a little too subdued but does not want to do reduce the dose any further.  Does not tolerate a higher dose because it is too sedating.  FU 6 weeks  Meredith Staggers, MD, DFAPA     Future Appointments  Date Time Provider Department Center  02/15/2023 10:30 AM [facility]-ANNUAL WELLNESS VISIT [facility]  03/23/2023 11:00 AM Cottle, Steva Ready., MD [facility] None    Orders Placed This Encounter  Procedures   Lithium level        -------------------------------

## 2023-02-15 ENCOUNTER — Ambulatory Visit: Payer: Medicare Other

## 2023-02-15 VITALS — Ht 75.0 in | Wt 267.0 lb

## 2023-02-15 DIAGNOSIS — Z122 Encounter for screening for malignant neoplasm of respiratory organs: Secondary | ICD-10-CM

## 2023-02-15 DIAGNOSIS — Z1211 Encounter for screening for malignant neoplasm of colon: Secondary | ICD-10-CM

## 2023-02-15 DIAGNOSIS — Z0000 Encounter for general adult medical examination without abnormal findings: Secondary | ICD-10-CM

## 2023-02-15 NOTE — Patient Instructions (Addendum)
Jonathon Walker , Thank you for taking time to come for your Medicare Wellness Visit. I appreciate your ongoing commitment to your health goals. Please review the following plan we discussed and let me know if I can assist you in the future.   Referrals/Orders/Follow-Ups/Clinician Recommendations:   This is a list of the screening recommended for you and due dates:  Health Maintenance  Topic Date Due   HIV Screening  Never done   Zoster (Shingles) Vaccine (1 of 2) Never done   Screening for Lung Cancer  Never done   Colon Cancer Screening  08/30/2022   Flu Shot  Never done   COVID-19 Vaccine (4 - 2023-24 season) 11/20/2022   Medicare Annual Wellness Visit  02/15/2024   DTaP/Tdap/Td vaccine (2 - Td or Tdap) 06/06/2029   Hepatitis C Screening  Completed   HPV Vaccine  Aged Out    Advanced directives: (Copy Requested) Please bring a copy of your health care power of attorney and living will to the office to be added to your chart at your convenience.  Next Medicare Annual Wellness Visit scheduled for next year: Yes  Insert preventive care Attachment reference

## 2023-02-15 NOTE — Progress Notes (Signed)
Subjective:   Jonathon Walker is a 57 y.o. male who presents for Medicare Annual/Subsequent preventive examination.  Visit Complete: Virtual I connected with  Jonathon Walker on 02/15/23 by a audio enabled telemedicine application and verified that I am speaking with the correct person using two identifiers.  Patient Location: Home  Provider Location: Home Office  I discussed the limitations of evaluation and management by telemedicine. The patient expressed understanding and agreed to proceed.  Vital Signs: Because this visit was a virtual/telehealth visit, some criteria may be missing or patient reported. Any vitals not documented were not able to be obtained and vitals that have been documented are patient reported.  Patient Medicare AWV questionnaire was completed by the patient on 02/14/23; I have confirmed that all information answered by patient is correct and no changes since this date.  Cardiac Risk Factors include: advanced age (>83men, >58 women);male gender     Objective:    Today's Vitals   02/15/23 1041  Weight: 267 lb (121.1 kg)  Height: 6\' 3"  (1.905 m)   Body mass index is 33.37 kg/m.     02/15/2023   10:47 AM 07/29/2021    3:52 PM 07/28/2020    2:04 PM 09/19/2017    7:38 AM 09/13/2017   12:44 PM  Advanced Directives  Does Patient Have a Medical Advance Directive? Yes Yes Yes Yes Yes  Type of Estate agent of [place];Living will Healthcare Power of [place];Living will Healthcare Power of [place];Living will Healthcare Power of [place];Living will Healthcare Power of [place];Living will  Does patient want to make changes to medical advance directive?  No - Patient declined No - Patient declined No - Patient declined No - Patient declined  Copy of Healthcare Power of Attorney in Chart? No - copy requested No - copy requested No - copy requested No - copy requested     Current Medications (verified) Outpatient Encounter Medications as  of 02/15/2023  Medication Sig   amLODipine (NORVASC) 5 MG tablet TAKE 1 TABLET BY MOUTH EVERY DAY   atorvastatin (LIPITOR) 40 MG tablet TAKE 1 TABLET(40 MG) BY MOUTH DAILY   fluvoxaMINE (LUVOX) 100 MG tablet Take 1.5 tablets (150 mg total) by mouth at bedtime.   lithium carbonate 300 MG capsule Take 3 capsules (900 mg total) by mouth at bedtime.   QUEtiapine Fumarate (SEROQUEL XR) 200 MG 24 hr tablet Take 1 tablet (200 mg total) by mouth at bedtime.   No facility-administered encounter medications on file as of 02/15/2023.    Allergies (verified) Patient has no known allergies.   History: Past Medical History:  Diagnosis Date   Anxiety    Bimalleolar fracture of left ankle    Depression    Heart murmur    Sleep apnea    uses CPAP nightly   Past Surgical History:  Procedure Laterality Date   ORIF ANKLE FRACTURE Left 09/19/2017   Procedure: OPEN REDUCTION INTERNAL FIXATION (ORIF) ANKLE FRACTURE;  Surgeon: Sheral Apley, MD;  Location: [facility];  Service: Orthopedics;  Laterality: Left;   TOOTH EXTRACTION     Family History  Problem Relation Age of Onset   Cancer Maternal Grandfather    Colon cancer Neg Hx    Colon polyps Neg Hx    Esophageal cancer Neg Hx    Rectal cancer Neg Hx    Stomach cancer Neg Hx    Social History   Socioeconomic History   Marital status: Single    Spouse  name: Not on file   Number of children: Not on file   Years of education: Not on file   Highest education level: Associate degree: occupational, technical, or vocational program  Occupational History   Not on file  Tobacco Use   Smoking status: Every Day    Current packs/day: 1.50    Average packs/day: 1.5 packs/day for 47.9 years (71.9 ttl pk-yrs)    Types: Cigarettes    Start date: 03/22/1975   Smokeless tobacco: Never   Tobacco comments:    He reports he has quit 3 times and recently started back in 2012. Hsm   Vaping Use   Vaping status: Never Used  Substance and  Sexual Activity   Alcohol use: Not Currently   Drug use: No   Sexual activity: Not on file  Other Topics Concern   Not on file  Social History Narrative   Not on file   Social Determinants of Health   Financial Resource Strain: Medium Risk (02/14/2023)   Overall Financial Resource Strain (CARDIA)    Difficulty of Paying Living Expenses: Somewhat hard  Food Insecurity: Unknown (02/14/2023)   Hunger Vital Sign    Worried About Running Out of Food in the Last Year: Patient declined    Ran Out of Food in the Last Year: Never true  Transportation Needs: No Transportation Needs (02/14/2023)   PRAPARE - Administrator, Civil Service (Medical): No    Lack of Transportation (Non-Medical): No  Physical Activity: Insufficiently Active (02/14/2023)   Exercise Vital Sign    Days of Exercise per Week: 2 days    Minutes of Exercise per Session: 10 min  Stress: Stress Concern Present (02/14/2023)   [employer] of Occupational Health - Occupational Stress Questionnaire    Feeling of Stress : Rather much  Social Connections: Moderately Isolated (02/14/2023)   Social Connection and Isolation Panel [NHANES]    Frequency of Communication with Friends and Family: More than three times a week    Frequency of Social Gatherings with Friends and Family: Twice a week    Attends Religious Services: Never    Database administrator or Organizations: No    Attends Engineer, structural: More than 4 times per year    Marital Status: Never married    Tobacco Counseling Ready to quit: No Counseling given: Yes Tobacco comments: He reports he has quit 3 times and recently started back in 2012. Hsm    Clinical Intake:  Pre-visit preparation completed: Yes  Pain : No/denies pain     BMI - recorded: 33.37 Nutritional Status: BMI > 30  Obese Nutritional Risks: None Diabetes: No  How often do you need to have someone help you when you read instructions, pamphlets, or other  written materials from your doctor or pharmacy?: 3 - Sometimes  Interpreter Needed?: No  Information entered by :: Jonathon Walker   Activities of Daily Living    02/14/2023   12:50 PM  In your present state of health, do you have any difficulty performing the following activities:  Hearing? 0  Vision? 0  Difficulty concentrating or making decisions? 1  Comment Followed by medical attention  Walking or climbing stairs? 0  Dressing or bathing? 0  Doing errands, shopping? 0  Preparing Food and eating ? N  Using the Toilet? N  In the past six months, have you accidently leaked urine? N  Do you have problems with loss of bowel control? N  Managing your  Medications? N  Managing your Finances? Y  Housekeeping or managing your Housekeeping? Y    Patient Care Team: Shirline Frees, NP as PCP - General (Family Medicine) Corky Crafts, MD as PCP - Cardiology (Cardiology) Cottle, Steva Ready., MD as Attending Physician (Psychiatry) Corky Crafts, MD as Consulting Physician (Cardiology) Verner Chol, [facility] (Inactive) as Pharmacist (Pharmacist)  Indicate any recent Medical Services you may have received from other than [facility] providers in the past year (date may be approximate).     Assessment:   This is a routine wellness examination for Glennon.  Hearing/Vision screen Hearing Screening - Comments:: Denies hearing difficulties   Vision Screening - Comments:: Wears reading glasses - up to date with routine eye exams with  Deferred   Goals Addressed               This Visit's Progress     Patient stated (pt-stated)        I want to get healthy without depression.       Depression Screen    02/15/2023   10:55 AM 07/21/2022   11:48 AM 07/29/2021    3:45 PM 06/17/2021    2:54 PM 07/28/2020    2:05 PM  PHQ 2/9 Scores  PHQ - 2 Score 2 6 4 6 3   PHQ- 9 Score  17 12 18 9     Fall Risk    02/15/2023   10:47 AM 02/14/2023   12:50 PM 07/29/2021    3:50 PM  07/28/2020    2:05 PM  Fall Risk   Falls in the past year? 0 0 0 0  Number falls in past yr: 0 0 0 0  Injury with Fall? 0 0 0 0  Risk for fall due to : No Fall Risks  No Fall Risks   Follow up Falls prevention discussed   Falls evaluation completed    MEDICARE RISK AT HOME: Medicare Risk at Home Any stairs in or around the home?: Yes If so, are there any without handrails?: No Home free of loose throw rugs in walkways, pet beds, electrical cords, etc?: No Adequate lighting in your home to reduce risk of falls?: Yes Use of a cane, walker or w/c?: No Grab bars in the bathroom?: No Shower chair or bench in shower?: No Elevated toilet seat or a handicapped toilet?: No  TIMED UP AND GO:  Was the test performed?  No    Cognitive Function:        02/15/2023   10:47 AM 07/29/2021    3:52 PM  6CIT Screen  What Year? 0 points 0 points  What month? 0 points 0 points  What time? 0 points 0 points  Count back from 20 0 points 0 points  Months in reverse 0 points 0 points  Repeat phrase 0 points 0 points  Total Score 0 points 0 points    Immunizations Immunization History  Administered Date(s) Administered   PFIZER(Purple Top)SARS-COV-2 Vaccination 07/03/2019, 07/24/2019, 02/27/2020   PNEUMOCOCCAL CONJUGATE-20 06/16/2020   Tdap 06/07/2019    TDAP status: Up to date  Flu Vaccine status: Due, Education has been provided regarding the importance of this vaccine. Advised may receive this vaccine at local pharmacy or Health Dept. Aware to provide a copy of the vaccination record if obtained from local pharmacy or Health Dept. Verbalized acceptance and understanding.    Covid-19 vaccine status: Declined, Education has been provided regarding the importance of this vaccine but patient still declined. Advised may  receive this vaccine at local pharmacy or Health Dept.or vaccine clinic. Aware to provide a copy of the vaccination record if obtained from local pharmacy or Health Dept.  Verbalized acceptance and understanding.  Qualifies for Shingles Vaccine? Yes   Zostavax completed No   Shingrix Completed?: No.    Education has been provided regarding the importance of this vaccine. Patient has been advised to call insurance company to determine out of pocket expense if they have not yet received this vaccine. Advised may also receive vaccine at local pharmacy or Health Dept. Verbalized acceptance and understanding.  Screening Tests Health Maintenance  Topic Date Due   HIV Screening  Never done   Zoster Vaccines- Shingrix (1 of 2) Never done   Lung Cancer Screening  Never done   Colonoscopy  08/30/2022   INFLUENZA VACCINE  Never done   COVID-19 Vaccine (4 - 2023-24 season) 11/20/2022   Medicare Annual Wellness (AWV)  02/15/2024   DTaP/Tdap/Td (2 - Td or Tdap) 06/06/2029   Hepatitis C Screening  Completed   HPV VACCINES  Aged Out    Health Maintenance  Health Maintenance Due  Topic Date Due   HIV Screening  Never done   Zoster Vaccines- Shingrix (1 of 2) Never done   Lung Cancer Screening  Never done   Colonoscopy  08/30/2022   INFLUENZA VACCINE  Never done   COVID-19 Vaccine (4 - 2023-24 season) 11/20/2022    Colorectal cancer screening: Referral to GI placed 02/15/23. Pt aware the office will call re: appt.  Lung Cancer Screening: (Low Dose CT Chest recommended if Age 19-80 years, 20 pack-year currently smoking OR have quit w/in 15years.) does qualify.   Lung Cancer Screening Referral: 02/15/23  Additional Screening:  Hepatitis C Screening: does qualify; Completed 06/16/20  Vision Screening: Recommended annual ophthalmology exams for early detection of glaucoma and other disorders of the eye. Is the patient up to date with their annual eye exam?  No Who is the provider or what is the name of the office in which the patient attends annual eye exams? Deferred If pt is not established with a provider, would they like to be referred to a provider to  establish care? No .   Dental Screening: Recommended annual dental exams for proper oral hygiene    Community Resource Referral / Chronic Care Management:  CRR required this visit?  No   CCM required this visit?  No     Plan:     I have personally reviewed and noted the following in the patient's chart:   Medical and social history Use of alcohol, tobacco or illicit drugs  Current medications and supplements including opioid prescriptions. Patient is not currently taking opioid prescriptions. Functional ability and status Nutritional status Physical activity Advanced directives List of other physicians Hospitalizations, surgeries, and ER visits in previous 12 months Vitals Screenings to include cognitive, depression, and falls Referrals and appointments  In addition, I have reviewed and discussed with patient certain preventive protocols, quality metrics, and best practice recommendations. A written personalized care plan for preventive services as well as general preventive health recommendations were provided to patient.     Tillie Rung, Walker   16/12/9602   After Visit Summary: (MyChart) Due to this being a telephonic visit, the after visit summary with patients personalized plan was offered to patient via MyChart   Nurse Notes: None

## 2023-02-23 LAB — LITHIUM LEVEL: Lithium Lvl: 0.7 mmol/L (ref 0.6–1.2)

## 2023-02-28 ENCOUNTER — Other Ambulatory Visit: Payer: Self-pay | Admitting: Adult Health

## 2023-02-28 DIAGNOSIS — I10 Essential (primary) hypertension: Secondary | ICD-10-CM

## 2023-03-23 ENCOUNTER — Telehealth: Payer: Self-pay

## 2023-03-23 ENCOUNTER — Encounter: Payer: Self-pay | Admitting: Psychiatry

## 2023-03-23 ENCOUNTER — Telehealth (INDEPENDENT_AMBULATORY_CARE_PROVIDER_SITE_OTHER): Payer: Medicare Other | Admitting: Psychiatry

## 2023-03-23 DIAGNOSIS — F411 Generalized anxiety disorder: Secondary | ICD-10-CM

## 2023-03-23 DIAGNOSIS — F422 Mixed obsessional thoughts and acts: Secondary | ICD-10-CM | POA: Diagnosis not present

## 2023-03-23 DIAGNOSIS — E538 Deficiency of other specified B group vitamins: Secondary | ICD-10-CM

## 2023-03-23 DIAGNOSIS — F3181 Bipolar II disorder: Secondary | ICD-10-CM | POA: Diagnosis not present

## 2023-03-23 DIAGNOSIS — G4733 Obstructive sleep apnea (adult) (pediatric): Secondary | ICD-10-CM

## 2023-03-23 DIAGNOSIS — Z79899 Other long term (current) drug therapy: Secondary | ICD-10-CM

## 2023-03-23 DIAGNOSIS — R7989 Other specified abnormal findings of blood chemistry: Secondary | ICD-10-CM

## 2023-03-23 DIAGNOSIS — F1021 Alcohol dependence, in remission: Secondary | ICD-10-CM

## 2023-03-23 MED ORDER — QUETIAPINE FUMARATE ER 300 MG PO TB24
300.0000 mg | ORAL_TABLET | Freq: Every day | ORAL | 1 refills | Status: DC
Start: 2023-03-23 — End: 2023-05-02

## 2023-03-23 NOTE — Telephone Encounter (Signed)
 Copied from CRM [id number]. Topic: Clinical - Medical Advice >> Mar 20, 2023  5:03 PM Drema MATSU wrote: Reason for CRM: Patient states that he woke up on Christmas Day with a really bad head cold. Symptoms are stuffy nose and sinus pressure. Patient wants to know if he can continue taking Mucinex and Tylenol  or should he be prescribed something.

## 2023-03-23 NOTE — Progress Notes (Signed)
 Jonathon Walker [id number] [birth year] 58 y.o.  Video Visit via My Chart  I connected with pt by video using My Chart and verified that I am speaking with the correct person using two identifiers.   I discussed the limitations, risks, security and privacy concerns of performing an evaluation and management service by My Chart  and the availability of in person appointments. I also discussed with the patient that there may be a patient responsible charge related to this service. The patient expressed understanding and agreed to proceed.  I discussed the assessment and treatment plan with the patient. The patient was provided an opportunity to ask questions and all were answered. The patient agreed with the plan and demonstrated an understanding of the instructions.   The patient was advised to call back or seek an in-person evaluation if the symptoms worsen or if the condition fails to improve as anticipated.  I provided 30 minutes of video time during this encounter.  The patient was located at home and the provider was located office. Session 1130-1200  Subjective:   Patient ID:  Jonathon Walker is a 58 y.o. (DOB 12-21-1965) male.  Chief Complaint:  Chief Complaint  Patient presents with   Follow-up   Depression   Anxiety    Depression        Associated symptoms include fatigue.  Associated symptoms include no decreased concentration and no suicidal ideas.  Past medical history includes anxiety.   Anxiety Symptoms include nervous/anxious behavior. Patient reports no confusion, decreased concentration, nausea, palpitations or suicidal ideas.       Jonathon Walker presents to the office today for follow-up of anxiety , depression, alcohol.    seen August 7.  His anxiety was unmanaged at Paxil  40 mg and olanzapine  5 mg so olanzapine  was increased to 7.5 daily.  seen December 17, 2018.  His anxiety was unmanaged.  The following change was made: Trial 1-1/2 of the 7.5 mg tablets  of olanzapine  for anxi.  He is had a stay at [facility] for alcohol dependence since he was last here. Stayed 28 days and DC before Thanksgiving.  Very helpful.  Was having NM nighly before and they stopped since then.  Not drinking.  Involved in AA since then.  seen March 25, 2019.  The patient was doing better requested a reduction in olanzapine  to 7.5 mg nightly to improve energy.  visit April 03, 2019.  He had remained sober and his anxiety was improved with sobriety.  He was having problems with low motivation and forgetfulness.  At the next refill it was decided olanzapine  would be reduced from 7.5 to 5 mg daily. He called requesting this urgent appointment because of worsening symptoms associated with returning to work.  seen May 21, 2019.  He was doing relatively well.  The following was noted: Tolerating Wellbutrin  and it seems to help. He didn't tolerate the increase. Anxiety is improved off alcohol.  Ok to reduce olanzapine  to 2.5 mg daily for a month and stop it.  Hopefully low motivation will improve.   He called back March 23 stating he was more depressed and having a harder time doing routine tasks and having suicidal thoughts.  Because of worsening depression after reducing the olanzapine  he was encouraged to increase olanzapine  back to 5 mg nightly. He called again March 24 stating he was really struggling but was scheduled for an appointment on March 26.  As of June 14, 2019 he reports the following: Every  day a real hard struggle to get through the days.  More anxious and depressed equally. Worse 2 weeks.  Last Saturday so overwhelmed by how he feels he had SI.  Everything is hard. Initial benefit paroxetine  has been lost.  Awakens stressed and tired. Adequate hours of sleep.  Will be major chore just to mow grass.  Still sober. SE sexual. Getting appt with CPAP doc. Changes made include: Increasing olanzapine  back to 5 mg daily a couple of days prior to the  appointment due to phone call and the addition of lithium  300 mg for a few days then increase to 600 mg nightly both to augment the antidepressant and because of suicidal thoughts.  July 15, 2019 appointment, the following is noted: Doing OK with both depression and anxiety.  Kind of like a top always going in mind.  Even when I sleep, when awakens mind still going.  NM nightly for years but less than before stopped drinking.  Smaller and less severe ones that don't wake him.  Theme can't finish things.  Both depression and anxiety 3/10.  More productive.  Paces himself.  Can live with it.  Sleep 9 hours.  Initially olanzapine  stopped the ruminating but it's back some.  Overall still benefit.  No SI and can't rmember why he had them before. Residual energy not great and some ruminating. He had some concerns about sexual side effects from Paxil  but agreed that no med changes were necessary despite residual symptoms of depression and anxiety.  08/14/2019 appointment, the following is noted:  He scheduled urgently. Vaccinated. Angry easily and exhausted and everything is a chore.  Exhausted. Never had appt with CPAP company.  CPAP is 59 years old and on same settings as in the past.  CPAP machine is not working properly but when it did it was helpful for alertness and energy.  Disc need to call them to have it evaluated. To bed 10-7.  Don't feel rested in morning but used to feel rested when first started paroxetine . Plan: Increase Lithium  continue to 900 mg daily for irritability  11/19/19 appt with the following noted: He increased to 900 mg and then reduced it but doesn't know why.  Reduced it to 300 nightly. Irritability much better.  Once anger since here with rage but not outward. Still on paroxetine  40, olanzapine  5, Wellbutrin  75 AM. Getting tired in afternoon and worrying about getting depressed but he's not depressed now.  Don't enjoy things like he used to.  Les interest and  excitement. Appetite and sleep are normal.   Still adjusting without alcohol and socialization. Worrying about lack of sex drive with paroxetine . Can still ruminate on things until he gets some reassurance through talking with people. Plan: Continue low-dose lithium  300 mg daily Continue Paxil .  Did not feel well at 60 mg so we will continue 40 mg.  Likely to lose anxiety benefit if change it. Continue low-dose Wellbutrin  75  Tolerating Wellbutrin  and it seems to help. He didn't tolerate the increase.   Continue olanzapine  5 mg  02/18/2020 appointment with following noted: Open to trying increase paroxetine  again bc ruminating wears him out and gets fatigued and it wears him out though is 80% better vs before paroxetine . Doesn't remember trying higher paroxetine .  Ruminates on relationships or work.  Whenever he can talk about it with someone it helps tremendously but also realizes paroxetine  helps. Anxiety affects dreams and NM too.  Up and down a little.  Anxiety drove depression  before paroxetine .  Best med he's tried. SOBER 1 YEAR SE no problem.  Except gained 25# in 4 years.  No change in diet and exercise.  No increase in appetite and no food cravings.  Seeing PCP soon. Plan: Increase paroxetine  trial to 60 for rumination.  Disc SE.  05/26/20 appt noted: No benefit increase paroxetine  nor SE.  Wants to reduce back to 30 mg paroxetine  and stop lithium  bc it didn't seem to help.   4 days of Calm app has seemed to help rumination.  Does it in afternoon and before bed and it seemed to helps.  Doing a 10 min meditation and it helps.   Dep 4/10.  Anxiety 4/10. And a lot better the last few days.   Plan: Reduce lithium  by 1 tablet per week.  Call if there is any suicidal thought. Starting April 1 reduce paroxetine  to 40 mg daily. Continue low-dose Wellbutrin  75  Tolerating Wellbutrin  and it seems to help. He didn't tolerate the increase.   If doing OK will try taper as he suggested at next  visit. Continue olanzapine  5 mg nightly it was helpful for depression and anxiety.  07/01/2020 appt with following noted: Moved appt up. Now says he understated depression level when he was here the last time. Restarted lithium  bc felt more depressed without it. Reduced paroxetine  to 40 mg daily. Lethargic.  Mind won't calm down.  Nonrestorative sleep but 8 hours.  Doesn't think sleep is deep enough.  Tense. Goes to [employer]. Started counseling. Wants to try something different with meds. Can have mixed sx for 6 weeks with increased interest in things and motivation and want to spend money but still feels depressed.  3 times in the last year. Plan: Increase  lithium  back to 3 daily to see if depression is better and less mood cycling.  He admits to feeling worse when he stopped the lithium  Continue the reduce paroxetine  to 45 mg daily. DC Wellbutrin  Lamotrigine  trial 25 mg to 100 mg daily over 4 weeks. Continue olanzapine  5 mg nightly it was helpful for depression and anxiety.  08/26/2020 appointment with the following noted: Sad and down.  Classic depression sx and very fatigued.  Unusual to have classic depression bc usually is atypical.  No effect from lamotrigine . Wonders if atorvastatin  is causing some confusion or depression bc read about it. Depressed for 3 mos. Gained 20#.  It's leveled off now. Olanzapine  helped the rumination and anxiety.   Life is more depressing bc not seeing friends like it was bc stopped drinking.   No SE with lithium .  Unless dropping things. Sleep 1030 to 6 but doesn't sleep deep.  Not rested in AM Plan: Increase  lithium  back to 3 daily to see if depression is better and less mood cycling.  He admits to feeling worse when he stopped the lithium  Continue the reduce paroxetine  to 40 mg daily bc it helped anxiety. Lamotrigine  increase to 150 mg daily DT no benefit or SE Increase olanzapine10 mg nightly it was helpful for depression and anxiety.   Need more help with depression   09/11/2020 phone call: Pt stated he is foegetting things throughout the day and very unsteady.He works with tools and equptment and this is a problem.He stated he takes 6 Lamictal  in the am. MD response: We made 3 med changes at the last visit including increasing lamotrigine  to 150 mg daily, increasing olanzapine  to 10 mg daily, and restarting lithium .  Any 1 of those  changes could possibly cause some of the side effects.  We will have to make 1 change at a time to evaluate this.  Therefore reduce lamotrigine  to 100 mg daily.  It may take a couple of weeks before he sees a difference.  11/13/2020 phone call: Patient lm stating he is currently taking Lamotrigine  150 mg. He has decreased it to 75 mg due to side effects per pt ( blurred vision, shaking and coordination issues). Patient stated he is discontinuing before his scheduled October appointment. MD response: Take the lamotrigine  75 mg for 2 weeks and he should be able to stop then without withdrawal.  If he gets shakey, nervous then we need to reduce more slowly and let us  know that.  Otherwise he can stop if he follows these directions.  11/30/2020 phone call: Pt called and advised he would like to taper off of the Olanzapine .  He has gained 8# since his last visit and 30# total since he started taking the med.  He feels steady, not so much stress now, and would like to see if he can take Lorazepam instead.  Then talk to Dr. Geoffry in Oct when they meet. MD response: Reduce olanzapine  to one half of the 10 mg tablet at night for 4 weeks and then stop it.  If he gets more depressed then get back in touch with us  and we will try to do something about the weight gain that olanzapine  can cause by using the alternative Lybalvi  12/07/2020 appointment with the following noted: Off lamotrigine  .  Reduced olanzapine  to 5 mg for a week.  On lithium  900 mg HS, paroxetine  40 Fatigue 10/10.  Dizziness resolved off lamotrigine .   Low fatigue and motivation.  I want to want to do things.  Stopped drinking almost 18 mos and socially big adjustment.   Still invloved with AA and daily sponsor.  Working [employer].  Haroldine Young helped. Thinks mild depression with little interest and motivation and no joy.  But not severe.  No SI. No anxiety and it's way better.  Life is more stable and helps.  Working 4 hours daily and occ extrea jobs. Uses new CPAP.  Stil drowsy and fatigue. Plan: Continue CPAP use. New CPAP machine. Trial modafinil  100-200 mg AM Lamotrigine  off DT NR Reduced olanzapine  to 5 mg daily for a week and well stop in 3 weeks DT wt gain and fatigue  01/20/2021 phone call: I called patient to see if I could get better information. He states he started modafinil  a couple of weeks ago, though Rx was written for in September. He states he didn't feel well before taking the modafinil  though. He just says he is agitated, irritable, and unstable feeling. When asked what he meant by unstable he stated feeling a shell of myself.  He has been put on the cancellation list.  MD response: It is likely the modafinil  that is causing this problem.  But it may be corrected with a dose reduction.  I assume he is taking a whole tablet.  If that is the case time to reduce to 1/2 tablet in the morning.  If he is taking half a tablet tell him to reduce it to a quarter of a tablet.  And I agree we need to try to get him in as soon as possible. He reduced modafinil  from 200 mg to 100 mg daily.  02/10/2021 appointment with the following noted: Current psych meds lithium  900 mg nightly, paroxetine  40 mg  daily, olanzapine  recently stopped, modafinil  recently tried. Was really bad for awhile feeling depresssed and agitated and anxiety but better now including after reducing modafinil  to 100 mg daily. Fatigue is better and in the middle now with energy, not good but not bad. Sleep is better.  CPAP doctor yesterday and they are gathering info  pending. NAC is helping with memory. Still sober and committed to it. SE constipation  Plan: No med changes  03/10/2021 phone call from patient complaining of vague feelings of not feeling well and wanting the Paxil  changed.  He was placed on the cancellation list.  03/18/2021 appointment urgently scheduled at his request: Remains on paroxetine  40 mg daily, lithium  1200 mg daily, modafinil  200 mg every morning. Ruminating  with sense of need to talk to someone.  Then feels better for awhile.  Mostly on relationships with family.  No one to talk to. Wonders need to chang ethe meds.   Don't socialize anymore since sober.  Does online AA everyday at noon. Lost 10# off olanzapine . Plan: Continue paroxetine  40 mg, lithium  1200 mg daily, Try stopping modafinil  100mg  AM to determine if it is helping or not. If not less anxious then start risperidone  for rumination 1-2 mg HS  04/05/2021 appointment with the following noted: Stopped  modafinil  and it helped reduce anxiety. Started risperidone  2 instead of 1 mg and was off balance so reduced risperidone  to 1 mg HS. Only done this for 3 nights.  No SE with it. Anxiety is not close to as bad as originally.  Can be painful but not now. Still ruminating some my whole adult life but varies.  Anxiety can lead to depression. 6-7/10 with 10 good on depression with some problems with ambition. Plan: Continue trial risperidone  for rumination 1 mg HS  04/07/2021 TC: Several phone calls: Called 2 days after the visit complaining of itching from the lithium  and wanting to come off of it.  He was warned he was more emotionally distressed when he reduced the lithium  in the past but he wanted to pursue it anyway.  He was continuing risperidone  1 mg nightly but wanting to reduce that also but was warned he would almost certainly have worsening mood severity and anxiety problems based on past experience.  He was cautioned against reducing lithium  below 900 mg  daily and risperidone  below 1 mg nightly.  He agreed on 04/09/2021.  04/09/2021 he called again and several phone calls ensued thereafter.  He insisted on stopping lithium  and risperidone  despite warnings that his mood stability and anxiety would worsen. He was prescribed Depakote  to take the place  04/20/2021 appointment with the following noted: He stopped risperidone  and did not take the Depakote .  He is taking lithium  900 mg nightly and paroxetine  40 mg daily. Stopped risperidone  bc feeling agitated and unstable and blamed it.  Still feels the same off of it. Says itching 85% better with less lithium .  Itching for a couple of mos.  Doesn't think anxiety noticeably worse after reducing lithium  to 900 mg daily. Not dizzy.  Clumsy with hands. Anxious but not painful like it was before Paxil  but tense at the end of the day.  Initially felt normal for the first time in 20 years when first started paxil  but not as good now. Not sadness and hasn't had much of it. Started therapy with doctor on demand. Ruminating on fear of not being fully present and worries about little things like appts pending.   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 Not in pain but feels disconnected and not myself.  Has felt like this off and on for years. Level of angst all day but not real high unless triggered.  Still has the rumination. Initially a lot of benefit from paroxetine  Not markedly deprssed and not sad.  07/14/21 appt noted:  Doesn't feel much different with switch to fluvoxamine  except anxiety well controlled. But still low energy, motivation and not as productive as he wants to be.  Not markedly sad.  Asks about Vraylar . Not much interest or enjoyment. Don't  sleep that great and don't relax that great. On fluvoxamine  200, lithium  900, off risperidone . Plan: Anxiety controlled by fluvoxamine  200 Potentiate Vraylar  1.5 mg daily  07/27/2021 phone call complaining of ongoing depression.  Vraylar  increased to 3 mg daily. 07/29/2021 phone call complaining of cost of Vraylar .  Was given samples. 08/20/2021 phone call complaining of fatigue.  Stated he stopped the Vraylar  because it made him agitated.  It was recommended there be no further med changes because he is only been off the Vraylar  for 1 week.  08/30/2021 appointment noted: Vraylar  1.5 mg didn't do much.  Increased to 3 mg and then felt irritable ans stopped it about 3rd week of May.  Better now. Still extremely tired and feels physically sick with normal workup. Thinks his mind wears him out.  Racing negative thoughts all the time. Asks about Ativan. Always nervous and anxious.  Trouble discerning anxiety from depression. Plan: Anxiety was controlled by fluvoxamine  200 but now he thinks it is worse. So increase 250 mg daily  10/18/21 appt noted: No results with increase fluvoxamine .  No SE. Anxiety is still worse than depression.  Thinks it causes somatic sx like the flu.  Exhausted. GI. Poor sleep and concentration. Asks about lorazepam.   Got checked out by PCP without findings. Plan: Reduce lithium  to 2 capsules daily to see if physical symptoms like fatigue etc. are better Increase fluvoxamine  to 1 tablet in the morning and 2 tablets in the evening to reduce anxiety Plan Trial reduction of  lithium  900  to 600 mg daily to see if somatic sx are better like fatigue.   01/06/22 appt noted Everything is about the same with med changes. CC anxiety and fatigue.  Sx started with severe depression in 20s and got better but never resolved. Thinks anxiety is causing fatigue.  Has to nap about 1 pm.  Easily fatigued.  No particular worry other than how he feels and himself.. Plan: Reduce fluvoxamine   gradually to 150 mg daily Start clomipramine  25 mg nightly for 5 days and increase to 75 mg HS  02/07/22 TC:  Pt lvm that the clomiprame 25 mg has been taking the full dose for two weeks now. He is experiencing mania. Overspending etc    MD resp:  Increase the lithium  back to 3 daily.  Stop the clomipramine .  Resume the risperidone  until these symptoms clear up.  Call us  back next week and let us  know how it is going.     02/16/22 TC:   Pt was just calling to give you an update.He stated he is still 50 percent manic but feels better.He is currently taking   lithium  carbonate 300 MG  3 daily risperiDONE  3 mg 1/2 tab daily fluvoxamine  100 MG tablet 1/2 tablet in the AM and 1 tablet at night On 11/20 changes were made,you had him stop clomipramine                      and increase lithium  to 900 mg daily.  02/16/22 MD: check lithium  level.  02/24/22 tC complaining of depression. MD :  He has had recent mood swings from manic to depressed by his report.  Lithium  level is low at 0.5 on 600 mg daily.  I think the most helpful thing for his mood would be to increase lithium  back to 900 mg daily.      03/07/22 TC: pt reported increasing his Luvox  to 200 mg daily.  03/08/22 appt noted: Multiple phone calls since here.   Clomipramine  triggered mania like he's never had before.  Increased over the top looking for a house, wanting to invent things, bought a shed and fish tank abruptly.  Left his job which was a good thing.  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  I feel a lot better about it.  Has disability and will do PT work.  I'll be OK. Exhausted and can't be on his feet all the time.   Sleep is different with EMA and ready to go but watches TV awhile and then goes back to sleep.  Always a late sleeper all his life.   Still feeling some depression and he increased the luvox  to 200 and it helped. Thinks parents are Autistic and don't respond emotionally.  M cannot understand feelings and  he doesn't get any support.   Plan: Bc recent mania with clomipramine  increase lithium  1200  mg daily .    03/29/2022 appointment urgently due to recent mania: Psych meds: fluvoxamine  200, lithium  1200, risperidone  1.5 mg HS Exhausted until the last couple of days. SE tremor Has not had any water  today and is lightheaded.   Mood a little better and anxiety better than it was.  No mania. Some underlying weight in mood that only responded to paroxetine  and clomipramine . Asks about Wellbutrin  for energy. Sleep normal 8-9 hours now.  Except last night. Goes to [facility] groups and support group and AA. Sober Reduce risperidone  to 1 mg to see if energy better  05/25/22 appt noted: Reduced lithium  about 10 days ago bc thought he might be getting too much bc read on the internet.  No changes since reducing it. More dep for a couple of mos which is unusual.  In bed a lot and more isolated and not normally what I have.  Not real sad but unmotivated and no energy to do things.    Start therapist Friday. No sig sadness.  Irritable and short with people over little things.  Kind of started after stopped drinking.  Worse.  Sleep on and off.  Erratic.  Looks for ward to night so can escape into sleep.   Anxious underlying for 30 years.  Hard to sit and relax and just watch TV.  Tends to ruminate. Asks to try Gaba. Now lethargy is worse than chronic anxiety Reducing risperidone  made no changes good or bad. DC risperidone  1 mg HS Add Latuda  1/2 tablet for 1 week then 1 tablet in the the evening  07/06/22 appt :  Increased Latuda  up to 100 mg .  No clear SE. Exhausted mentally and it makes his body tired.  Working 10-2 and then  comes home and lays in bed a couple of hours.  Not a deep sleep.  Never feels fully rested.  Sleep HS 10-7.   Normal appetitie.   Still some sadness and irritability.  Anxiety doesn't seem better but is having less mood swings and mania.  Anxiety wears him down.   Plan: Increase  Latuda   120 mg in the evening with food  07/21/22 appt noted: Trouble keeping mood chart.  If I talk and I'm heard then feels better for hours but then damn stopped up.  Almost like an obsessive thing to talk for relief. No clear effect of increase Latuda  except reduced som cognitive anxiety but not physical anxiety. Says there was med he took before paxil  that gave him instant relief and wonders about retrying it.  Doesn't know which med. Paxil  had best effect when it worked for a period of time. Exhausted but fidget when sits.   Dep and anxious moderate.  09/01/22 appt noted: Meds clonazepam  0.5 mg BID prn, fluvoxamine  200 mg daily, lithium  900 HS, latuda  120 mg daily, vit D3 This type of dep since November after mania is low energy, anhedonia, low appetite, a lot of time in bed, textbook depresssion, lack of interest but not debilitating sadness.   Tired of it.  Dep is worse than anxiety and used to be the other way around.  Not as irritable. In past dep was anxious and disconnected and wasn't as tired.  Had to leave work bc can't get himself out of bed.   Toss and turn in bed fidgety in bed, feels like has to move but no energy to walk.  Fidgety for 3 mos. Clonazepam  takes the edge off but feels a higher dose is needed.  Plan: Changes: Reduce Latuda  to 60 mg daily. Start samples of Auvelity  1 in the AM for dep that is TRD  10/20/22 appt noted: Meds clonazepam  0.5 mg BID prn, fluvoxamine  200 mg daily, lithium  900 HS, latuda  60 mg daily, vit D3, out of Auvelity  after 2 weeks. Less restless but still has it 6/10 after reduced Latuda .  Thinks he had it before eLatuda but not sure. Takes an hour to get to sleep.   No change in dep from this visit to last visit. Did have positive effects from Auvelity  but ? SE conc.  Did help energy Lately more dep than anxiety.  Noted ASA helps anxiety. Couple times situational anger but no mood swings. Plan: Changes: Reduce fluvoxamine  to 1 and 1/2 tablet daily to  see if restlessness better Reduce Latuda  from 60 to 40 mg daily to see if restlessness better Retry Auvelity  1 in the AM for 7 days then 1 twice daily  11/14/22 appt noted: Twitching and restlessness better.   Still some teeth grinding but better Auvelity  didn't help mood.  Ongoing classic lethargy, poor appetite.  Can't exercise bc depression.  Little tasks overwhelming.    clonazepam  0.5 mg BID prn, fluvoxamine  150 mg daily, lithium  900 HS, latuda  40 mg daily, vit D3,  Auvelity  BID SE constipation and bruxism. Sleep fair and best and most relaxed 730-930 AM.  To bed 10 pm.   12/26/22 appt noted: More relaxed if not underpressure but not working and needs to. Not much motivation or energy in AM.  Doesn't notice the effects of Seroquel  for many hours, but it is relaxing med. For last 7 years hard time with motivation and capacity.   Happened years ago and then it resolved after 7 years with  Wellbutrin  Plan: Changes none:  Continue 1/2 quetiapine  XR 150 mg PM.  This is helping him to feel more calm in the morning than he has felt in a very long time.  However he thinks now he is a little too subdued but does not want to do reduce the dose any further.  Does not tolerate a higher dose because it is too sedating.  01/09/23 TC:  Patient taking 75 mg of Seroquel  and sleeping great at night, but also sleeping a lot during the day. He reports he doesn't want to be around people, is content to be at home not doing anything. He rates depression as 7/10, anxiety 5/10. Reports anxiety is better since starting Seroquel . He also takes: Fluvoxamine  150 mg at bedtime Lithium  900 mg at bedtime, though 1200 is prescribed. No longer taking clonazepam .    MD resp:  There are a limited number of options left to us  and it is important that each med be tried at his most effective dose.  Seroquel  is FDA approved for depression in doses of 150 mg and higher.  The low dose he is currently on is enough to help  anxiety and his sleep but probably not enough to help depression.  We may need to go as high as 200 or 300 mg a day.  He is probably fearful of being too sedated at higher doses but this medicine does not necessarily make people more sleepy at higher doses.  I think she got to go up to the full quetiapine  XR 150 mg daily and give that a try until his appointment with me.      02/08/23 appt noted: Psych med: no Clonazepam  0.5 mg twice daily as needed anxiety, fluvoxamine  150 nightly, lithium  900 nightly, quetiapine  XR 150 at 3 pm Less am drowsiness taking Seroquel  in the afternoon. Chronic fatigue unchanged.  Rare days without it.  Can do one task daily.  Can work 1 and 1/2 hours daily.  Naps about 2 pm.   Seroquel  helps sleep and anxiety.   Easily overwhelmed if multiple tasks.   Feels much better in the AM than in years.  By noon is worn out.   Plan: Increase for TRD quetiapine  XR 200 mg PM.   03/23/23 appt noted: Meds as above except incr Seroquel  XR 200 mg daily. Anxiety better but still some dep with less motivation maybe 20% better. No sig SE except dryness. URI for 8 days. Sleep 10 hours per usual.   Much more relaxed with Seroquel .   On disability for 7-8 years.  Working PT [employer] for alcohol.  Sober 2 year 01/2019  Past psych meds:   Paxil  60 too much + wt gain,   duloxetine, Zoloft,  Viibryd 60 partial resp (trial after paroxetine ) SE diarrhea. Trintellix 10 agitated Wellbutrin  75 (max tolerated),   Fluvoxamine  300 no better than 200 which seems to take edge off anxiety Clomipramine  mania Auvelity  4 weeks NR  Abilify, Rexulti 2. Vraylar  irritable at 3 mg daily.  Latuda  120 fidgety Olanzapine  10 fatigue Risperdal  2 once Seroquel  XR 150  Buspar 30 BID stopped DT partial effect.  Xanax craving,   lithium   900, worse when he stopped lithium  Lamotrigine  NR CO dizzy  NAC 600 helped Modafinil  200 agitated Took lorazepam  in the past, Xanax less  helpful  B severe OCD obsessions psych tx.  Review of Systems:  Review of Systems  Constitutional:  Positive for fatigue. Negative for unexpected weight change.  Cardiovascular:  Negative for palpitations.  Gastrointestinal:  Positive for constipation. Negative for nausea.  Neurological:  Negative for tremors.  Psychiatric/Behavioral:  Positive for dysphoric mood. Negative for agitation, behavioral problems, confusion, decreased concentration, hallucinations, self-injury, sleep disturbance and suicidal ideas. The patient is nervous/anxious. The patient is not hyperactive.     Medications: I have reviewed the patient's current medications.  Current Outpatient Medications  Medication Sig Dispense Refill   amLODipine  (NORVASC ) 5 MG tablet TAKE 1 TABLET BY MOUTH EVERY DAY. Schedule physical exam for further refills 30 tablet 0   atorvastatin  (LIPITOR) 40 MG tablet TAKE 1 TABLET(40 MG) BY MOUTH DAILY 90 tablet 1   fluvoxaMINE  (LUVOX ) 100 MG tablet Take 1.5 tablets (150 mg total) by mouth at bedtime. 135 tablet 0   lithium  carbonate 300 MG capsule Take 3 capsules (900 mg total) by mouth at bedtime. 270 capsule 0   QUEtiapine  Fumarate (SEROQUEL  XR) 200 MG 24 hr tablet Take 1 tablet (200 mg total) by mouth at bedtime. 30 tablet 1   No current facility-administered medications for this visit.    Medication Side Effects: sexual, weight  Allergies: No Known Allergies  Past Medical History:  Diagnosis Date   Anxiety    Bimalleolar fracture of left ankle    Depression    Heart murmur    Sleep apnea    uses CPAP nightly    Family History  Problem Relation Age of Onset   Cancer Maternal Grandfather    Colon cancer Neg Hx    Colon polyps Neg Hx    Esophageal cancer Neg Hx    Rectal cancer Neg Hx    Stomach cancer Neg Hx     Social History   Socioeconomic History   Marital status: Single    Spouse name: Not on file   Number of children: Not on file   Years of education: Not on  file   Highest education level: Associate degree: occupational, scientist, product/process development, or vocational program  Occupational History   Not on file  Tobacco Use   Smoking status: Every Day    Current packs/day: 1.50    Average packs/day: 1.5 packs/day for 48.0 years (72.0 ttl pk-yrs)    Types: Cigarettes    Start date: 03/22/1975   Smokeless tobacco: Never   Tobacco comments:    He reports he has quit 3 times and recently started back in 2012. Hsm   Vaping Use   Vaping status: Never Used  Substance and Sexual Activity   Alcohol use: Not Currently   Drug use: No   Sexual activity: Not on file  Other Topics Concern   Not on file  Social History Narrative   Not on file   Social Drivers of Health   Financial Resource Strain: Medium Risk (02/14/2023)   Overall Financial Resource Strain (CARDIA)    Difficulty of Paying Living Expenses: Somewhat hard  Food Insecurity: Unknown (02/14/2023)   Hunger Vital Sign    Worried About Running Out of Food in the Last Year: Patient declined    Ran Out of Food in the Last Year: Never true  Transportation Needs: No Transportation Needs (02/14/2023)   PRAPARE - Administrator, Civil Service (Medical): No    Lack of Transportation (Non-Medical): No  Physical Activity: Insufficiently Active (02/14/2023)   Exercise Vital Sign    Days of Exercise per Week: 2 days    Minutes of Exercise per Session: 10 min  Stress: Stress Concern Present (02/14/2023)   [place]  [employer] - Occupational Stress Questionnaire    Feeling of Stress : Rather much  Social Connections: Moderately Isolated (02/14/2023)   Social Connection and Isolation Panel [NHANES]    Frequency of Communication with Friends and Family: More than three times a week    Frequency of Social Gatherings with Friends and Family: Twice a week    Attends Religious Services: Never    Database Administrator or Organizations: No    Attends Engineer, Structural: More than  4 times per year    Marital Status: Never married  Intimate Partner Violence: Not At Risk (02/15/2023)   Humiliation, Afraid, Rape, and Kick questionnaire    Fear of Current or Ex-Partner: No    Emotionally Abused: No    Physically Abused: No    Sexually Abused: No    Past Medical History, Surgical history, Social history, and Family history were reviewed and updated as appropriate.   Please see review of systems for further details on the patient's review from today.   Objective:   Physical Exam:  There were no vitals taken for this visit.  Physical Exam Constitutional:      General: He is not in acute distress.    Appearance: Normal appearance. He is well-developed.     Comments: Red face  Musculoskeletal:        General: No deformity.  Neurological:     Mental Status: He is alert and oriented to person, place, and time.     Motor: No tremor.     Coordination: Coordination normal.     Gait: Gait normal.  Psychiatric:        Attention and Perception: Attention normal. He is attentive.        Mood and Affect: Mood is anxious and depressed. Affect is not labile, blunt, angry or inappropriate.        Speech: Speech normal.        Behavior: Behavior normal. Behavior is not agitated or slowed.        Thought Content: Thought content normal. Thought content is not delusional. Thought content does not include homicidal or suicidal ideation. Thought content does not include suicidal plan.        Cognition and Memory: Cognition normal.        Judgment: Judgment normal.     Comments: Insight  Fair. Continued mood problems without much mania but irritable and anxious.  Dep worse than anxiety Seroquel  helps anxiety a good bit.      Lab Review:     Component Value Date/Time   NA 141 03/01/2022 0904   K 4.0 03/01/2022 0904   CL 107 03/01/2022 0904   CO2 27 03/01/2022 0904   GLUCOSE 81 03/01/2022 0904   BUN 18 03/01/2022 0904   CREATININE 1.01 03/01/2022 0904   CALCIUM  9.6  03/01/2022 0904   PROT 7.6 06/17/2021 1350   ALBUMIN 4.7 06/17/2021 1350   AST 19 06/17/2021 1350   ALT 23 06/17/2021 1350   ALKPHOS 108 06/17/2021 1350   BILITOT 0.5 06/17/2021 1350       Component Value Date/Time   WBC 9.5 07/21/2022 1143   RBC 5.10 07/21/2022 1143   HGB 15.9 07/21/2022 1143   HGB 15.2 05/05/2010 1049   HCT 45.7 07/21/2022 1143   HCT 44.8 05/05/2010 1049   PLT 218.0 07/21/2022 1143   PLT 193 05/05/2010 1049   MCV 89.7 07/21/2022 1143   MCV 89.6 05/05/2010 1049   MCH 30.4 05/05/2010  1049   MCHC 34.8 07/21/2022 1143   RDW 13.4 07/21/2022 1143   RDW 13.1 05/05/2010 1049   LYMPHSABS 2.5 07/21/2022 1143   LYMPHSABS 1.8 05/05/2010 1049   MONOABS 0.8 07/21/2022 1143   MONOABS 0.4 05/05/2010 1049   EOSABS 0.6 07/21/2022 1143   EOSABS 0.2 05/05/2010 1049   BASOSABS 0.1 07/21/2022 1143   BASOSABS 0.0 05/05/2010 1049    Lithium  Lvl  Date Value Ref Range Status  02/22/2023 0.7 0.6 - 1.2 mmol/L Final  02/22/23 lithium  0.7 on 900 mg daily. 04/01/22 lithium  level 0.9 on 1200 mg daily  02/02/21 lithium  level 0.6 on 900 mg daily  No results found for: PHENYTOIN, PHENOBARB, VALPROATE, CBMZ   .res Assessment: Plan:     Muath was seen today for follow-up, depression and anxiety.  Diagnoses and all orders for this visit:  Bipolar II disorder (HCC)  GAD (generalized anxiety disorder)  Mixed obsessional thoughts and acts  OSA (obstructive sleep apnea)  Lithium  use  Low vitamin D  level  Low serum vitamin B12  Alcohol dependence in remission (HCC)   Chronic recurrent rumination with anxiety and urgency and sense of need and instablity with impatience ongoing with frequent phone calls between appts. but  but anhedonic depression and anxious.  Needs a lot of reassurance. Chronic worry and overwhelmed easily and misattributes sx as SE of meds leading to inadequate med trials.  Fear of meds.Falsely attributes his psych sx to the meds and then stops  meds AMA.  False attribution of sx as SE.  But overall this is less of a problem than it was. Discussed the use of a mood chart which may help address some of this part attribution and better delineate mood cycling.  Gave him a copy of the chart reviewed in detail with him as to how to keep it.  Anhedonic dep and anxiety.  Dep worse than anxiety.  Anxiety average.  Less restless.  Anxiety was better but not gone on fluvoxamine  150 mg .   he describe history other manic cycling symptoms including increased interest, increased goal-directed behaviors, increased urge to spend that will last for 6 weeks or so and then stop.  He has these cycles about 3 times a year.  This is suggestive of a bipolar predisposition which was confirmed by recent cycle into mania from clomipramine .  Continue CPAP use. New CPAP machine.  Discussed potential metabolic side effects associated with atypical antipsychotics, as well as potential risk for movement side effects. Advised pt to contact office if movement side effects occur.  He agrees.  Call if you have any problems with this.  He remains sober.    Bc late 2023 mania with clomipramine  increased lithium  1200  mg daily .   He cut it back to 900 mg daily.  He admits to feeling worse when he stopped the lithium .  Call if sx get worse. Counseled patient regarding potential benefits, risks, and side effects of lithium  to include potential risk of lithium  affecting thyroid  and renal function.  Discussed need for periodic lab monitoring to determine drug level and to assess for potential adverse effects.  Counseled patient regarding signs and symptoms of lithium  toxicity and advised that they notify office immediately or seek urgent medical attention if experiencing these signs and symptoms.  Patient advised to contact office with any questions or concerns. 02/02/21  02/02/21 lithium  level 0.6 on 900 mg daily, normal B12 and vitamin D  52 04/01/22 lithium  level 0.9 on 1200  mg  daily  02/02/21 lithium  level 0.6 on 900 mg daily Check lithium  level  Disc the off-label use of N-Acetylcysteine at 600 mg daily to help with mild cognitive problems.  It can be combined with a B-complex vitamin as the B-12 and folate have been shown to sometimes enhance the effect.  Partial benefit so continue NAC 1200 mg daily.  Continue vitamin D  DT history level 30  Constipation management 1.  Lots of water  2.  Powdered fiber supplement such as MiraLAX, Citrucel, etc. preferably with a meal 3.  2 stool softeners a day 4.  Milk of magnesia or magnesium tablets if needed  Disc tx of recent URI  Try to stay as active as possible.  Please see After Visit Summary for patient specific instructions. Increase further for TRD quetiapine  XR 300 mg PM.  This is helping him to feel more calm in the morning than he has felt in a very long time.    FU 6 weeks  Lorene Macintosh, MD, DFAPA     Future Appointments  Date Time Provider Department Center  05/02/2023 10:30 AM Cottle, Lorene KANDICE Raddle., MD [facility] None  02/19/2024 10:30 AM [facility] VISIT [facility]    No orders of the defined types were placed in this encounter.       -------------------------------

## 2023-03-28 NOTE — Telephone Encounter (Signed)
 Spoke to pt and he stated that he is feeling a little better. Pt declined ov and will call back if needed.

## 2023-04-04 ENCOUNTER — Telehealth: Payer: Self-pay | Admitting: Psychiatry

## 2023-04-04 NOTE — Telephone Encounter (Signed)
 Pt called and LM . He recently increased the Seroquel to 300mg . He states that he has been more jittery, unstable and irritable. Can this be due to med?

## 2023-04-04 NOTE — Telephone Encounter (Addendum)
 Pt last seen 1/2.  Reports being mentally jittery, unstable, and irritable. He was irritable during our conversation. He said his family stresses him out. He is sleeping approximately 10 hours a night. Said the increased dose of Seroquel  knocks him out until 11 AM. He started the increased dose of Seroquel  on 1/3.   He reports taking Wellbutrin  150 mg, 1 AM and 1/2  PM.  He denied taking fluvoxamine , but the dosing he provided for Wellbutrin  is what you prescribed for fluvoxamine .  Seroquel  300 mg Lithium  900 mg Vitamin D  Not taking NAC  Starting therapy in a couple of weeks.

## 2023-04-04 NOTE — Telephone Encounter (Signed)
 When did he stop the fluvoxamine  and why?  He told me before this it helped his anxiety.  Stopping that medicine could be the reason he is more anxious now.  It is very uncommon for Seroquel  to cause jitteriness instability or irritability.  It tends to help all of those things.  Wellbutrin  can make people jittery and irritable and especially when he stopped the SSRI fluvoxamine .  The Wellbutrin  has not helped sufficiently if he wants to stop something I would suggest he stop that and continue the Seroquel  at 300 mg nightly because that has a better odds of helping his mood overall. But I do need explanation about why he stopped fluvoxamine  and when he stopped it

## 2023-04-05 ENCOUNTER — Ambulatory Visit: Payer: Medicare Other | Admitting: Adult Health

## 2023-04-05 NOTE — Telephone Encounter (Signed)
 Did he stop fluvoxamine ?  If so when and why?

## 2023-04-05 NOTE — Telephone Encounter (Signed)
 Called patient, and as I suspected he was confused and was mixing up the names of medication. He has not been on Wellbutrin  since 2021. He said he hoped he was taking the wrong thing because he doesn't feel well. He said even paying his bills is a struggle for him to focus on. He reports being very irritable. I told him that I had enough conversations with him that I could tell he was irritable yesterday.  He told me it wasn't me and we laughed about it.

## 2023-04-06 NOTE — Telephone Encounter (Signed)
Called patient back and he is reporting the same sx as previous. He said it is hard to describe, he just feels off center. He is sleeping all the time, staying home, and no energy to do anything. His irritability is not as evident today. He denies any SI/HI.   In regards to the confusion over the medication, he thought he was taking Wellbutrin, but went to the pharmacy to try to get a RF and was told he had not taken it since 2021. He realized at that time he was mixing up the names. He never stopped the fluvoxamine, taking as prescribed. He said he feels worse than he did before increasing the Seroquel.

## 2023-04-06 NOTE — Telephone Encounter (Signed)
By my count, he has taken 19 different psychiatric meds.  We are running out of options.  He has only been on this dose for 2 weeks.  He needs to give this med change more time until his next appt.  It needs more time and he may get better with it.

## 2023-04-06 NOTE — Telephone Encounter (Signed)
Pt LVM @ 12:36p.  I could really understand his message in parts.  He said he was calling back to [facility] about something they discussed yesterday that only lasted and hour or two.  He only slept about 2 hours.  He feels "off".  If he needs to wait to see Dr Jennelle Human, he will but he wants him to be updated because he (the pt) is concerned.  Next appt 2/11

## 2023-04-06 NOTE — Telephone Encounter (Signed)
He did not stop the fluvoxamine.

## 2023-04-07 NOTE — Telephone Encounter (Signed)
Patient notified of recommendations. 

## 2023-04-13 ENCOUNTER — Other Ambulatory Visit: Payer: Self-pay | Admitting: Adult Health

## 2023-04-13 DIAGNOSIS — I10 Essential (primary) hypertension: Secondary | ICD-10-CM

## 2023-04-13 MED ORDER — AMLODIPINE BESYLATE 5 MG PO TABS: ORAL_TABLET | ORAL | 0 refills | Status: DC

## 2023-04-13 NOTE — Telephone Encounter (Signed)
Last Fill: 02/28/23  Last OV: 04/05/2023 Next OV: 02/19/2024 AWV  Routing to provider for review/authorization.

## 2023-04-13 NOTE — Telephone Encounter (Signed)
Copied from CRM [id number]. Topic: Clinical - Medication Refill >> Apr 13, 2023  1:26 PM Theodis Sato wrote: Most Recent Primary Care Visit:  Provider: Tillie Rung  Department: [facility]  Visit Type: MEDICARE AWV, SEQUENTIAL  Date: 02/15/2023  Medication: amLODipine (NORVASC) 5 MG tablet  Has the patient contacted their pharmacy? Yes - pharmacy requested re-fill and did NOT get a reponse  (Agent: If no, request that the patient contact the pharmacy for the refill. If patient does not wish to contact the pharmacy document the reason why and proceed with request.) (Agent: If yes, when and what did the pharmacy advise?)  Is this the correct pharmacy for this prescription? Yes If no, delete pharmacy and type the correct one.  This is the patient's preferred pharmacy:  [id number] [place], [place] - [address] AT [facility] [address] [facility] [address] [place] [address] Phone: [phone] Fax: [phone]     Has the prescription been filled recently? Yes  Is the patient out of the medication? Yes  Has the patient been seen for an appointment in the last year OR does the patient have an upcoming appointment? Yes  Can we respond through MyChart? No  Agent: Please be advised that Rx refills may take up to 3 business days. We ask that you follow-up with your pharmacy.

## 2023-04-14 ENCOUNTER — Telehealth: Payer: Self-pay | Admitting: Psychiatry

## 2023-04-14 NOTE — Telephone Encounter (Signed)
The only thing he's taking that might worsen mania or cause agitation is fluvoxamine.  So reduce that to 1 tablet daily.

## 2023-04-14 NOTE — Telephone Encounter (Signed)
Patient notified to decrease the fluvoxamine from 1.5 to 1 tablet.

## 2023-04-14 NOTE — Telephone Encounter (Signed)
Patient called again today.  He said he feels like he is "going to lose my marbles." He is sleeping well. He mentions mania, but when I asked him questions about this he could only report buying a lot off [employer], but things that only cost $10-$20. He has been calling high school friends to talk. I asked him if he was lonely because he said it helped him to talk, and he said maybe. I asked if he got out and he said he didn't like to go out, that he didn't like to be around people because he was embarrassed that he was quirky and on edge.  I asked him about caffeine use and he does have a lot of caffeine. Recommended he at least cut back on this. He is starting therapy next week thru [facility], which is virtual. He denied being suicidal, but did report being tired of it all. We discussed what you said last week and he is just worried it will get worse. He has previously been in a cycle where he called every Friday afternoon.  He has FU 2/11.

## 2023-04-14 NOTE — Telephone Encounter (Signed)
Pt lvm that he is manic and feels very unstable. He would like to speak with bridget. Please call him at [phone]

## 2023-04-19 ENCOUNTER — Other Ambulatory Visit: Payer: Self-pay | Admitting: Psychiatry

## 2023-04-19 DIAGNOSIS — F3181 Bipolar II disorder: Secondary | ICD-10-CM

## 2023-04-25 ENCOUNTER — Ambulatory Visit: Payer: Medicare Other | Admitting: Adult Health

## 2023-04-25 ENCOUNTER — Other Ambulatory Visit: Payer: Self-pay

## 2023-05-02 ENCOUNTER — Encounter: Payer: Self-pay | Admitting: Psychiatry

## 2023-05-02 ENCOUNTER — Ambulatory Visit: Payer: Medicare Other | Admitting: Psychiatry

## 2023-05-02 ENCOUNTER — Other Ambulatory Visit: Payer: Self-pay | Admitting: Adult Health

## 2023-05-02 DIAGNOSIS — R7989 Other specified abnormal findings of blood chemistry: Secondary | ICD-10-CM

## 2023-05-02 DIAGNOSIS — G4733 Obstructive sleep apnea (adult) (pediatric): Secondary | ICD-10-CM | POA: Diagnosis not present

## 2023-05-02 DIAGNOSIS — F1021 Alcohol dependence, in remission: Secondary | ICD-10-CM

## 2023-05-02 DIAGNOSIS — F3181 Bipolar II disorder: Secondary | ICD-10-CM | POA: Diagnosis not present

## 2023-05-02 DIAGNOSIS — F422 Mixed obsessional thoughts and acts: Secondary | ICD-10-CM | POA: Diagnosis not present

## 2023-05-02 DIAGNOSIS — F411 Generalized anxiety disorder: Secondary | ICD-10-CM

## 2023-05-02 DIAGNOSIS — E538 Deficiency of other specified B group vitamins: Secondary | ICD-10-CM

## 2023-05-02 DIAGNOSIS — Z79899 Other long term (current) drug therapy: Secondary | ICD-10-CM

## 2023-05-02 MED ORDER — QUETIAPINE FUMARATE ER 200 MG PO TB24: ORAL_TABLET | ORAL | 1 refills | Status: DC

## 2023-05-02 NOTE — Progress Notes (Signed)
 MARQUAN VOKES [id number] [birth year] 58 y.o.  Subjective:   Patient ID:  XUE LOW is a 58 y.o. (DOB [birth year]) male.  Chief Complaint:  Chief Complaint  Patient presents with   Follow-up   Depression   Anxiety   Medication Reaction   Agitation    Depression        Associated symptoms include fatigue.  Associated symptoms include no decreased concentration and no suicidal ideas.  Past medical history includes anxiety.   Anxiety Symptoms include nervous/anxious behavior. Patient reports no confusion, decreased concentration, nausea, palpitations or suicidal ideas.       [name] presents to the office today for follow-up of anxiety , depression, alcohol.    seen August 7.  His anxiety was unmanaged at Paxil 40 mg and olanzapine 5 mg so olanzapine was increased to 7.5 daily.  seen December 17, 2018.  His anxiety was unmanaged.  The following change was made: Trial 1-1/2 of the 7.5 mg tablets of olanzapine for anxi.  He is had a stay at [facility] for alcohol dependence since he was last here. Stayed 28 days and DC before Thanksgiving.  Very helpful.  Was having NM nighly before and they stopped since then.  Not drinking.  Involved in AA since then.  seen March 25, 2019.  The patient was doing better requested a reduction in olanzapine to 7.5 mg nightly to improve energy.  visit April 03, 2019.  He had remained sober and his anxiety was improved with sobriety.  He was having problems with low motivation and forgetfulness.  At the next refill it was decided olanzapine would be reduced from 7.5 to 5 mg daily. He called requesting this urgent appointment because of worsening symptoms associated with returning to work.  seen May 21, 2019.  He was doing relatively well.  The following was noted: Tolerating Wellbutrin and it seems to help. He didn't tolerate the increase. Anxiety is improved off alcohol.  Ok to reduce olanzapine to 2.5 mg daily for a month and  stop it.  Hopefully low motivation will improve.   He called back March 23 stating he was more depressed and having a harder time doing routine tasks and having suicidal thoughts.  Because of worsening depression after reducing the olanzapine he was encouraged to increase olanzapine back to 5 mg nightly. He called again March 24 stating he was really struggling but was scheduled for an appointment on March 26.  As of June 14, 2019 he reports the following: Every day a real hard struggle to get through the days.  More anxious and depressed equally. Worse 2 weeks.  Last Saturday so overwhelmed by how he feels he had SI.  Everything is hard. Initial benefit paroxetine has been lost.  Awakens stressed and tired. Adequate hours of sleep.  Will be major chore just to mow grass.  Still sober. SE sexual. Getting appt with CPAP doc. Changes made include: Increasing olanzapine back to 5 mg daily a couple of days prior to the appointment due to phone call and the addition of lithium 300 mg for a few days then increase to 600 mg nightly both to augment the antidepressant and because of suicidal thoughts.  July 15, 2019 appointment, the following is noted: Doing OK with both depression and anxiety.  Kind of like a top always going in mind.  Even when I sleep, when awakens mind still going.  NM nightly for years but less than before stopped drinking.  Smaller  and less severe ones that don't wake him.  Theme can't finish things.  Both depression and anxiety 3/10.  More productive.  Paces himself.  Can live with it.  Sleep 9 hours.  Initially olanzapine stopped the ruminating but it's back some.  Overall still benefit.  No SI and can't rmember why he had them before. Residual energy not great and some ruminating. He had some concerns about sexual side effects from Paxil but agreed that no med changes were necessary despite residual symptoms of depression and anxiety.  08/14/2019 appointment, the following is noted:   He scheduled urgently. Vaccinated. Angry easily and exhausted and everything is a chore.  Exhausted. Never had appt with CPAP company.  CPAP is 59 years old and on same settings as in the past.  CPAP machine is not working properly but when it did it was helpful for alertness and energy.  Disc need to call them to have it evaluated. To bed 10-7.  Don't feel rested in morning but used to feel rested when first started paroxetine. Plan: Increase Lithium continue to 900 mg daily for irritability  11/19/19 appt with the following noted: He increased to 900 mg and then reduced it but doesn't know why.  Reduced it to 300 nightly. Irritability much better.  Once anger since here with rage but not outward. Still on paroxetine 40, olanzapine 5, Wellbutrin 75 AM. Getting tired in afternoon and worrying about getting depressed but he's not depressed now.  Don't enjoy things like he used to.  Les interest and excitement. Appetite and sleep are normal.   Still adjusting without alcohol and socialization. Worrying about lack of sex drive with paroxetine. Can still ruminate on things until he gets some reassurance through talking with people. Plan: Continue low-dose lithium 300 mg daily Continue Paxil.  Did not feel well at 60 mg so we will continue 40 mg.  Likely to lose anxiety benefit if change it. Continue low-dose Wellbutrin 75  Tolerating Wellbutrin and it seems to help. He didn't tolerate the increase.   Continue olanzapine 5 mg  02/18/2020 appointment with following noted: Open to trying increase paroxetine again bc ruminating wears him out and gets fatigued and it wears him out though is 80% better vs before paroxetine. Doesn't remember trying higher paroxetine.  Ruminates on relationships or work.  Whenever he can talk about it with someone it helps tremendously but also realizes paroxetine helps. Anxiety affects dreams and NM too.  Up and down a little.  Anxiety drove depression before paroxetine.   Best med he's tried. SOBER 1 YEAR SE no problem.  Except gained 25# in 4 years.  No change in diet and exercise.  No increase in appetite and no food cravings.  Seeing PCP soon. Plan: Increase paroxetine trial to 60 for rumination.  Disc SE.  05/26/20 appt noted: No benefit increase paroxetine nor SE.  Wants to reduce back to 30 mg paroxetine and stop lithium bc it didn't seem to help.   4 days of Calm app has seemed to help rumination.  Does it in afternoon and before bed and it seemed to helps.  Doing a 10 min meditation and it helps.   Dep 4/10.  Anxiety 4/10. And a lot better the last few days.   Plan: Reduce lithium by 1 tablet per week.  Call if there is any suicidal thought. Starting April 1 reduce paroxetine to 40 mg daily. Continue low-dose Wellbutrin 75  Tolerating Wellbutrin and it seems to help.  He didn't tolerate the increase.   If doing OK will try taper as he suggested at next visit. Continue olanzapine 5 mg nightly it was helpful for depression and anxiety.  07/01/2020 appt with following noted: Moved appt up. Now says he understated depression level when he was here the last time. Restarted lithium bc felt more depressed without it. Reduced paroxetine to 40 mg daily. Lethargic.  Mind won't calm down.  Nonrestorative sleep but 8 hours.  Doesn't think sleep is deep enough.  Tense. Goes to [employer]. Started counseling. Wants to try something different with meds. Can have mixed sx for 6 weeks with increased interest in things and motivation and want to spend money but still feels depressed.  3 times in the last year. Plan: Increase  lithium back to 3 daily to see if depression is better and less mood cycling.  He admits to feeling worse when he stopped the lithium Continue the reduce paroxetine to 45 mg daily. DC Wellbutrin Lamotrigine trial 25 mg to 100 mg daily over 4 weeks. Continue olanzapine 5 mg nightly it was helpful for depression and anxiety.  08/26/2020  appointment with the following noted: Sad and down.  Classic depression sx and very fatigued.  Unusual to have classic depression bc usually is atypical.  No effect from lamotrigine. Wonders if atorvastatin is causing some confusion or depression bc read about it. Depressed for 3 mos. Gained 20#.  It's leveled off now. Olanzapine helped the rumination and anxiety.   Life is more depressing bc not seeing friends like it was bc stopped drinking.   No SE with lithium.  Unless dropping things. Sleep 1030 to 6 but doesn't sleep deep.  Not rested in AM Plan: Increase  lithium back to 3 daily to see if depression is better and less mood cycling.  He admits to feeling worse when he stopped the lithium Continue the reduce paroxetine to 40 mg daily bc it helped anxiety. Lamotrigine increase to 150 mg daily DT no benefit or SE Increase olanzapine10 mg nightly it was helpful for depression and anxiety.  Need more help with depression   09/11/2020 phone call: Pt stated he is foegetting things throughout the day and very unsteady.He works with tools and equptment and this is a problem.He stated he takes 6 Lamictal in the am. MD response: We made 3 med changes at the last visit including increasing lamotrigine to 150 mg daily, increasing olanzapine to 10 mg daily, and restarting lithium.  Any 1 of those changes could possibly cause some of the side effects.  We will have to make 1 change at a time to evaluate this.  Therefore reduce lamotrigine to 100 mg daily.  It may take a couple of weeks before he sees a difference.  11/13/2020 phone call: Patient lm stating he is currently taking Lamotrigine 150 mg. He has decreased it to 75 mg due to side effects per pt ( blurred vision, shaking and coordination issues). Patient stated he is discontinuing before his scheduled October appointment. MD response: Take the lamotrigine 75 mg for 2 weeks and he should be able to stop then without withdrawal.  If he gets shakey,  nervous then we need to reduce more slowly and let us know that.  Otherwise he can stop if he follows these directions.  11/30/2020 phone call: Pt called and advised he would like to taper off of the Olanzapine.  He has gained 8# since his last visit and 30# total since he  started taking the med.  He feels steady, not so much stress now, and would like to see if he can take Lorazepam instead.  Then talk to Dr. Jennelle Human in Oct when they meet. MD response: Reduce olanzapine to one half of the 10 mg tablet at night for 4 weeks and then stop it.  If he gets more depressed then get back in touch with [place] and we will try to do something about the weight gain that olanzapine can cause by using the alternative Lybalvi  12/07/2020 appointment with the following noted: Off lamotrigine .  Reduced olanzapine to 5 mg for a week.  On lithium 900 mg HS, paroxetine 40 Fatigue 10/10.  Dizziness resolved off lamotrigine.  Low fatigue and motivation.  "I want to want to do things".  Stopped drinking almost 18 mos and socially big adjustment.   Still invloved with AA and daily sponsor.  Working [employer].  Reece Levy Young helped. Thinks mild depression with little interest and motivation and no joy.  But not severe.  No SI. No anxiety and it's way better.  Life is more stable and helps.  Working 4 hours daily and occ extrea jobs. Uses new CPAP.  Stil drowsy and fatigue. Plan: Continue CPAP use. New CPAP machine. Trial modafinil 100-200 mg AM Lamotrigine off DT NR Reduced olanzapine to 5 mg daily for a week and well stop in 3 weeks DT wt gain and fatigue  01/20/2021 phone call: I called patient to see if I could get better information. He states he started modafinil a couple of weeks ago, though Rx was written for in September. He states he didn't feel well before taking the modafinil though. He just says he is agitated, irritable, and unstable feeling. When asked what he meant by unstable he stated feeling a "shell of myself".  He  has been put on the cancellation list.  MD response: It is likely the modafinil that is causing this problem.  But it may be corrected with a dose reduction.  I assume he is taking a whole tablet.  If that is the case time to reduce to 1/2 tablet in the morning.  If he is taking half a tablet tell him to reduce it to a quarter of a tablet.  And I agree we need to try to get him in as soon as possible. He reduced modafinil from 200 mg to 100 mg daily.  02/10/2021 appointment with the following noted: Current psych meds lithium 900 mg nightly, paroxetine 40 mg daily, olanzapine recently stopped, modafinil recently tried. Was really bad for awhile feeling depresssed and agitated and anxiety but better now including after reducing modafinil to 100 mg daily. Fatigue is better and in the middle now with energy, not good but not bad. Sleep is better.  CPAP doctor yesterday and they are gathering info pending. NAC is helping with memory. Still sober and committed to it. SE constipation  Plan: No med changes  03/10/2021 phone call from patient complaining of vague feelings of not feeling well and wanting the Paxil changed.  He was placed on the cancellation list.  03/18/2021 appointment urgently scheduled at his request: Remains on paroxetine 40 mg daily, lithium 1200 mg daily, modafinil 200 mg every morning. "Ruminating"  with sense of need to talk to someone.  Then feels better for awhile.  Mostly on relationships with family.  No one to talk to. Wonders need to chang ethe meds.   Don't socialize anymore since sober.  Does online AA everyday at noon. Lost 10# off olanzapine. Plan: Continue paroxetine 40 mg, lithium 1200 mg daily, Try stopping modafinil 100mg  AM to determine if it is helping or not. If not less anxious then start risperidone for rumination 1-2 mg HS  04/05/2021 appointment with the following noted: Stopped  modafinil and it helped reduce anxiety. Started risperidone 2 instead of  1 mg and was off balance so reduced risperidone to 1 mg HS. Only done this for 3 nights.  No SE with it. Anxiety is not close to as bad as originally.  Can be painful but not now. Still ruminating some "my whole adult life" but varies.  Anxiety can lead to depression. 6-7/10 with 10 good on depression with some problems with ambition. Plan: Continue trial risperidone for rumination 1 mg HS  04/07/2021 TC: Several phone calls: Called 2 days after the visit complaining of itching from the lithium and wanting to come off of it.  He was warned he was more emotionally distressed when he reduced the lithium in the past but he wanted to pursue it anyway.  He was continuing risperidone 1 mg nightly but wanting to reduce that also but was warned he would almost certainly have worsening mood severity and anxiety problems based on past experience.  He was cautioned against reducing lithium below 900 mg daily and risperidone below 1 mg nightly.  He agreed on 04/09/2021.  04/09/2021 he called again and several phone calls ensued thereafter.  He insisted on stopping lithium and risperidone despite warnings that his mood stability and anxiety would worsen. He was prescribed Depakote to take the place  04/20/2021 appointment with the following noted: He stopped risperidone and did not take the Depakote.  He is taking lithium 900 mg nightly and paroxetine 40 mg daily. Stopped risperidone bc feeling agitated and unstable and blamed it.  Still feels the same off of it. Says itching 85% better with less lithium.  Itching for a couple of mos.  Doesn't think anxiety noticeably worse after reducing lithium to 900 mg daily. Not dizzy.  Clumsy with hands. Anxious but not painful like it was before Paxil but tense at the end of the day.  Initially felt normal for the first time in 20 years when first started paxil but not as good now. Not sadness and hasn't had much of it. Started therapy with doctor on demand. Ruminating on  "fear of not being fully present" and worries about little things like appts pending.   Afraid of working FT because gets overwhelmed bc "I take it home with me" Plan : Continue trial risperidone but increase to rumination 1.5 mg HS Continue paroxetine 40 and lithium 600 mg   06/01/2021 appointment with the following noted: Haven't seen benefit with risperidone 1.5 mg daily. Not in pain but feels disconnected and not myself.  Has felt like this off and on for years. Level of angst all day but not real high unless triggered.  Still has the rumination. Initially a lot of benefit from paroxetine Not markedly deprssed and not sad.  07/14/21 appt noted:  Doesn't feel much different with switch to fluvoxamine except anxiety well controlled. But still low energy, motivation and not as productive as he wants to be.  Not markedly sad.  Asks about Vraylar. Not much interest or enjoyment. Don't sleep that great and don't relax that great. On fluvoxamine 200, lithium 900, off risperidone. Plan: Anxiety controlled by fluvoxamine 200 Potentiate Vraylar 1.5 mg daily  07/27/2021  phone call complaining of ongoing depression.  Vraylar increased to 3 mg daily. 07/29/2021 phone call complaining of cost of Vraylar.  Was given samples. 08/20/2021 phone call complaining of fatigue.  Stated he stopped the Vraylar because it made him agitated.  It was recommended there be no further med changes because he is only been off the Vraylar for 1 week.  08/30/2021 appointment noted: Vraylar 1.5 mg didn't do much.  Increased to 3 mg and then felt irritable ans stopped it about 3rd week of May.  Better now. Still extremely tired and feels physically sick with normal workup. Thinks his mind wears him out.  Racing negative thoughts all the time. Asks about Ativan. Always nervous and anxious.  Trouble discerning anxiety from depression. Plan: Anxiety was controlled by fluvoxamine 200 but now he thinks it is worse. So increase 250  mg daily  10/18/21 appt noted: No results with increase fluvoxamine.  No SE. Anxiety is still worse than depression.  Thinks it causes somatic sx like the flu.  Exhausted. GI. Poor sleep and concentration. Asks about lorazepam.   Got checked out by PCP without findings. Plan: Reduce lithium to 2 capsules daily to see if physical symptoms like fatigue etc. are better Increase fluvoxamine to 1 tablet in the morning and 2 tablets in the evening to reduce anxiety Plan Trial reduction of  lithium 900  to 600 mg daily to see if somatic sx are better like fatigue.   01/06/22 appt noted" Everything is about the same with med changes. CC anxiety and fatigue.  Sx started with severe depression in 20s and got better but never resolved. Thinks anxiety is causing fatigue.  Has to nap about 1 pm.  Easily fatigued.  No particular worry other than how he feels and himself.. Plan: Reduce fluvoxamine gradually to 150 mg daily Start clomipramine 25 mg nightly for 5 days and increase to 75 mg HS  02/07/22 TC:  Pt lvm that the clomiprame 25 mg has been taking the full dose for two weeks now. He is experiencing mania. Overspending etc    MD resp:  Increase the lithium back to 3 daily.  Stop the clomipramine.  Resume the risperidone until these symptoms clear up.  Call us back next week and let us know how it is going.     02/16/22 TC:   Pt was just calling to give you an update.He stated he is still 50 percent manic but feels better.He is currently taking   lithium carbonate 300 MG 3 daily risperiDONE 3 mg 1/2 tab daily fluvoxamine 100 MG tablet 1/2 tablet in the AM and 1 tablet at night On 11/20 changes were made,you had him stop clomipramine                     and increase lithium to 900 mg daily.  02/16/22 MD: check lithium level.  02/24/22 tC complaining of depression. MD :  He has had recent mood swings from manic to depressed by his report.  Lithium level is low at 0.5 on 600 mg daily.  I think  the most helpful thing for his mood would be to increase lithium back to 900 mg daily.      03/07/22 TC: pt reported increasing his Luvox to 200 mg daily.  03/08/22 appt noted: Multiple phone calls since here.   Clomipramine triggered mania like he's never had before.  Increased over the top looking for a house, wanting to invent things, bought a  shed and fish tank abruptly.  Left his job which was a good thing.  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  I feel a lot better about it.  Has disability and will do PT work.  I'll be OK. Exhausted and can't be on his feet all the time.   Sleep is different with EMA and ready to go but watches TV awhile and then goes back to sleep.  Always a late sleeper all his life.   Still feeling some depression and he increased the luvox to 200 and it helped. Thinks parents are Autistic and don't respond emotionally.  M cannot understand feelings and he doesn't get any support.   Plan: Bc recent mania with clomipramine increase lithium 1200  mg daily .    03/29/2022 appointment urgently due to recent mania: Psych meds: fluvoxamine 200, lithium 1200, risperidone 1.5 mg HS Exhausted until the last couple of days. SE tremor Has not had any water today and is lightheaded.   Mood a little better and anxiety better than it was.  No mania. Some underlying weight in mood that only responded to paroxetine and clomipramine. Asks about Wellbutrin for energy. Sleep normal 8-9 hours now.  Except last night. Goes to [facility] groups and support group and AA. Sober Reduce risperidone to 1 mg to see if energy better  05/25/22 appt noted: Reduced lithium about 10 days ago bc thought he might be getting too much bc read on the internet.  No changes since reducing it. More dep for a couple of mos which is unusual.  In bed a lot and more isolated and not normally what I have.  Not real sad but unmotivated and no energy to do things.    Start therapist Friday. No sig sadness.   Irritable and short with people over little things.  Kind of started after stopped drinking.  Worse.  Sleep on and off.  Erratic.  Looks for ward to night so can escape into sleep.   Anxious underlying for 30 years.  Hard to sit and relax and just watch TV.  Tends to ruminate. Asks to try Gaba. Now lethargy is worse than chronic anxiety Reducing risperidone made no changes good or bad. DC risperidone 1 mg HS Add Latuda 1/2 tablet for 1 week then 1 tablet in the the evening  07/06/22 appt :  Increased Latuda up to 100 mg .  No clear SE. Exhausted mentally and it makes his body tired.  Working 10-2 and then comes home and lays in bed a couple of hours.  Not a deep sleep.  Never feels fully rested.  Sleep HS 10-7.   Normal appetitie.   Still some sadness and irritability.  Anxiety doesn't seem better but is having less mood swings and mania.  Anxiety wears him down.   Plan: Increase  Latuda 120 mg in the evening with food  07/21/22 appt noted: Trouble keeping mood chart.  If I talk and I'm heard then feels better for hours but then damn stopped up.  Almost like an obsessive thing to talk for relief. No clear effect of increase Latuda except reduced som cognitive anxiety but not physical anxiety. Says there was med he took before paxil that gave him instant relief and wonders about retrying it.  Doesn't know which med. Paxil had best effect when it worked for a period of time. Exhausted but fidget when sits.   Dep and anxious moderate.  09/01/22 appt noted: Meds  clonazepam 0.5 mg BID prn, fluvoxamine 200 mg daily, lithium 900 HS, latuda 120 mg daily, vit D3 This type of dep since November after mania is low energy, anhedonia, low appetite, a lot of time in bed, "textbook" depresssion, lack of interest but not debilitating sadness.   Tired of it.  Dep is worse than anxiety and used to be the other way around.  Not as irritable. In past dep was anxious and disconnected and wasn't as tired.  Had to  leave work bc can't get himself out of bed.   Toss and turn in bed fidgety in bed, feels like has to move but no energy to walk.  Fidgety for 3 mos. Clonazepam takes the edge off but feels a higher dose is needed.  Plan: Changes: Reduce Latuda to 60 mg daily. Start samples of Auvelity 1 in the AM for dep that is TRD  10/20/22 appt noted: Meds clonazepam 0.5 mg BID prn, fluvoxamine 200 mg daily, lithium 900 HS, latuda 60 mg daily, vit D3, out of Auvelity after 2 weeks. Less restless but still has it 6/10 after reduced Latuda.  Thinks he had it before eLatuda but not sure. Takes an hour to get to sleep.   No change in dep from this visit to last visit. Did have positive effects from Lorraine but ? SE conc.  Did help energy Lately more dep than anxiety.  Noted ASA helps anxiety. Couple times situational anger but no mood swings. Plan: Changes: Reduce fluvoxamine to 1 and 1/2 tablet daily to see if restlessness better Reduce Latuda from 60 to 40 mg daily to see if restlessness better Retry Auvelity 1 in the AM for 7 days then 1 twice daily  11/14/22 appt noted: Twitching and restlessness better.   Still some teeth grinding but better Auvelity didn't help mood.  Ongoing classic lethargy, poor appetite.  Can't exercise bc depression.  Little tasks overwhelming.    clonazepam 0.5 mg BID prn, fluvoxamine 150 mg daily, lithium 900 HS, latuda 40 mg daily, vit D3,  Auvelity BID SE constipation and bruxism. Sleep fair and best and most relaxed 730-930 AM.  To bed 10 pm.   12/26/22 appt noted: More relaxed if not underpressure but not working and needs to. Not much motivation or energy in AM.  Doesn't notice the effects of Seroquel for many hours, but it is relaxing med. For last 7 years hard time with motivation and capacity.   Happened years ago and then it resolved after 7 years with Wellbutrin Plan: Changes none:  Continue 1/2 quetiapine XR 150 mg PM.  This is helping him to feel more calm in the  morning than he has felt in a very long time.  However he thinks now he is a little too subdued but does not want to do reduce the dose any further.  Does not tolerate a higher dose because it is too sedating.  01/09/23 TC:  Patient taking 75 mg of Seroquel and sleeping great at night, but also sleeping a lot during the day. He reports he doesn't want to be around people, is content to be at home not doing anything. He rates depression as 7/10, anxiety 5/10. Reports anxiety is better since starting Seroquel. He also takes: Fluvoxamine 150 mg at bedtime Lithium 900 mg at bedtime, though 1200 is prescribed. No longer taking clonazepam.    MD resp:  There are a limited number of options left to [place] and it is important that each med be  tried at his most effective dose.  Seroquel is FDA approved for depression in doses of 150 mg and higher.  The low dose he is currently on is enough to help anxiety and his sleep but probably not enough to help depression.  We may need to go as high as 200 or 300 mg a day.  He is probably fearful of being too sedated at higher doses but this medicine does not necessarily make people more sleepy at higher doses.  I think she got to go up to the full quetiapine XR 150 mg daily and give that a try until his appointment with me.      02/08/23 appt noted: Psych med: no Clonazepam 0.5 mg twice daily as needed anxiety, fluvoxamine 150 nightly, lithium 900 nightly, quetiapine XR 150 at 3 pm Less am drowsiness taking Seroquel in the afternoon. Chronic fatigue unchanged.  Rare days without it.  Can do one task daily.  Can work 1 and 1/2 hours daily.  Naps about 2 pm.   Seroquel helps sleep and anxiety.   Easily overwhelmed if multiple tasks.   Feels much better in the AM than in years.  By noon is worn out.   Plan: Increase for TRD quetiapine XR 200 mg PM.   03/23/23 appt noted: Meds as above except incr Seroquel XR 200 mg daily. Anxiety better but still some dep with less  motivation maybe 20% better. No sig SE except dryness. URI for 8 days. Sleep 10 hours per usual.   Much more relaxed with Seroquel. Plan: Increase further for TRD quetiapine XR 300 mg PM.   05/02/23 appt noted: Multiple phone calls since here complaining of ongoing sx and med rxns. Incr and decrease fluvoxamine to 150 now 100 complaining of no benefit for anxiety at the higher dose and feeling somewhat agitated.   Current meds:.  Fluvoxamine 100 nightly, lithium 900 nightly, quetiapine ER 300 mg nightly. Felt annoyed, angry , agitated without reason and called.   Is a little better the last 10 days.  Not nearly as angry SE wt gain and constipation. Asks about switch to paxil bc helped the most of anything.   Speaking to counselor.  Fearful childhood.   On disability for 7-8 years.  Working PT [employer] for alcohol.  Sober 2 year 01/2019  Past psych meds:   Paxil 60 "too much" + wt gain,   duloxetine, Zoloft,  Viibryd 60 partial resp (trial after paroxetine) SE diarrhea. Trintellix 10 agitated Wellbutrin 75 (max tolerated),   Fluvoxamine 300 no better than 200 which seems to take edge off anxiety Clomipramine mania Auvelity 4 weeks NR  Abilify, Rexulti 2. Vraylar irritable at 3 mg daily.  Latuda 120 fidgety Olanzapine 10 fatigue Risperdal 2 once Seroquel XR 300'/  Buspar 30 BID stopped DT partial effect.  Xanax craving,   lithium  900, worse when he stopped lithium Lamotrigine NR CO dizzy  NAC 600 helped Modafinil 200 agitated Took lorazepam  in the past, Xanax less helpful  B severe OCD obsessions psych tx.  Review of Systems:  Review of Systems  Constitutional:  Positive for fatigue. Negative for unexpected weight change.  Cardiovascular:  Negative for palpitations.  Gastrointestinal:  Positive for constipation. Negative for nausea.  Neurological:  Negative for tremors.  Psychiatric/Behavioral:  Positive for dysphoric mood. Negative for agitation,  behavioral problems, confusion, decreased concentration, hallucinations, self-injury, sleep disturbance and suicidal ideas. The patient is nervous/anxious. The patient is not hyperactive.  Medications: I have reviewed the patient's current medications.  Current Outpatient Medications  Medication Sig Dispense Refill   amLODipine (NORVASC) 5 MG tablet TAKE 1 TABLET BY MOUTH EVERY DAY. Schedule physical exam for further refills 30 tablet 0   atorvastatin (LIPITOR) 40 MG tablet TAKE 1 TABLET(40 MG) BY MOUTH DAILY 90 tablet 1   fluvoxaMINE (LUVOX) 100 MG tablet Take 1.5 tablets (150 mg total) by mouth at bedtime. (Patient taking differently: Take 100 mg by mouth at bedtime.) 135 tablet 0   lithium carbonate 300 MG capsule Take 3 capsules (900 mg total) by mouth at bedtime. 270 capsule 0   QUEtiapine (SEROQUEL XR) 200 MG 24 hr tablet 2 tablets in evening for 3 weeks then 3 tablets in the evening. 90 tablet 1   No current facility-administered medications for this visit.    Medication Side Effects: sexual, weight  Allergies: No Known Allergies  Past Medical History:  Diagnosis Date   Anxiety    Bimalleolar fracture of left ankle    Depression    Heart murmur    Sleep apnea    uses CPAP nightly    Family History  Problem Relation Age of Onset   Cancer Maternal Grandfather    Colon cancer Neg Hx    Colon polyps Neg Hx    Esophageal cancer Neg Hx    Rectal cancer Neg Hx    Stomach cancer Neg Hx     Social History   Socioeconomic History   Marital status: Single    Spouse name: Not on file   Number of children: Not on file   Years of education: Not on file   Highest education level: Some college, no degree  Occupational History   Not on file  Tobacco Use   Smoking status: Every Day    Current packs/day: 1.50    Average packs/day: 1.5 packs/day for 48.1 years (72.2 ttl pk-yrs)    Types: Cigarettes    Start date: 03/22/1975   Smokeless tobacco: Never   Tobacco comments:     He reports he has quit 3 times and recently started back in 2012. Hsm   Vaping Use   Vaping status: Never Used  Substance and Sexual Activity   Alcohol use: Not Currently   Drug use: No   Sexual activity: Not on file  Other Topics Concern   Not on file  Social History Narrative   Not on file   Social Drivers of Health   Financial Resource Strain: Low Risk  (04/25/2023)   Overall Financial Resource Strain (CARDIA)    Difficulty of Paying Living Expenses: Not very hard  Recent Concern: Financial Resource Strain - Medium Risk (02/14/2023)   Overall Financial Resource Strain (CARDIA)    Difficulty of Paying Living Expenses: Somewhat hard  Food Insecurity: No Food Insecurity (04/25/2023)   Hunger Vital Sign    Worried About Running Out of Food in the Last Year: Never true    Ran Out of Food in the Last Year: Never true  Transportation Needs: No Transportation Needs (04/25/2023)   PRAPARE - Administrator, Civil Service (Medical): No    Lack of Transportation (Non-Medical): No  Physical Activity: Inactive (04/25/2023)   Exercise Vital Sign    Days of Exercise per Week: 0 days    Minutes of Exercise per Session: 10 min  Stress: Stress Concern Present (04/25/2023)   [employer] of Occupational Health - Occupational Stress Questionnaire    Feeling of Stress : Very  much  Social Connections: Moderately Isolated (04/25/2023)   Social Connection and Isolation Panel [NHANES]    Frequency of Communication with Friends and Family: Three times a week    Frequency of Social Gatherings with Friends and Family: Once a week    Attends Religious Services: Never    Database administrator or Organizations: Yes    Attends Engineer, structural: More than 4 times per year    Marital Status: Never married  Intimate Partner Violence: Not At Risk (02/15/2023)   Humiliation, Afraid, Rape, and Kick questionnaire    Fear of Current or Ex-Partner: No    Emotionally Abused: No     Physically Abused: No    Sexually Abused: No    Past Medical History, Surgical history, Social history, and Family history were reviewed and updated as appropriate.   Please see review of systems for further details on the patient's review from today.   Objective:   Physical Exam:  There were no vitals taken for this visit.  Physical Exam Constitutional:      General: He is not in acute distress.    Appearance: Normal appearance. He is well-developed.     Comments: Red face  Musculoskeletal:        General: No deformity.  Neurological:     Mental Status: He is alert and oriented to person, place, and time.     Motor: No tremor.     Coordination: Coordination normal.     Gait: Gait normal.  Psychiatric:        Attention and Perception: Attention normal. He is attentive.        Mood and Affect: Mood is anxious and depressed. Affect is not labile, blunt, angry or inappropriate.        Speech: Speech normal.        Behavior: Behavior normal. Behavior is not agitated or slowed.        Thought Content: Thought content normal. Thought content is not delusional. Thought content does not include homicidal or suicidal ideation. Thought content does not include suicidal plan.        Cognition and Memory: Cognition normal.        Judgment: Judgment normal.     Comments: Insight  Fair. Continued mood problems without much mania but irritable and anxious.  Dep worse than anxiety Seroquel helps anxiety a good bit.      Lab Review:     Component Value Date/Time   NA 141 03/01/2022 0904   K 4.0 03/01/2022 0904   CL 107 03/01/2022 0904   CO2 27 03/01/2022 0904   GLUCOSE 81 03/01/2022 0904   BUN 18 03/01/2022 0904   CREATININE 1.01 03/01/2022 0904   CALCIUM 9.6 03/01/2022 0904   PROT 7.6 06/17/2021 1350   ALBUMIN 4.7 06/17/2021 1350   AST 19 06/17/2021 1350   ALT 23 06/17/2021 1350   ALKPHOS 108 06/17/2021 1350   BILITOT 0.5 06/17/2021 1350       Component Value Date/Time    WBC 9.5 07/21/2022 1143   RBC 5.10 07/21/2022 1143   HGB 15.9 07/21/2022 1143   HGB 15.2 05/05/2010 1049   HCT 45.7 07/21/2022 1143   HCT 44.8 05/05/2010 1049   PLT 218.0 07/21/2022 1143   PLT 193 05/05/2010 1049   MCV 89.7 07/21/2022 1143   MCV 89.6 05/05/2010 1049   MCH 30.4 05/05/2010 1049   MCHC 34.8 07/21/2022 1143   RDW 13.4 07/21/2022 1143   RDW 13.1 05/05/2010  1049   LYMPHSABS 2.5 07/21/2022 1143   LYMPHSABS 1.8 05/05/2010 1049   MONOABS 0.8 07/21/2022 1143   MONOABS 0.4 05/05/2010 1049   EOSABS 0.6 07/21/2022 1143   EOSABS 0.2 05/05/2010 1049   BASOSABS 0.1 07/21/2022 1143   BASOSABS 0.0 05/05/2010 1049    Lithium Lvl  Date Value Ref Range Status  02/22/2023 0.7 0.6 - 1.2 mmol/L Final  02/22/23 lithium 0.7 on 900 mg daily. 04/01/22 lithium level 0.9 on 1200 mg daily  02/02/21 lithium level 0.6 on 900 mg daily  No results found for: "PHENYTOIN", "PHENOBARB", "VALPROATE", "CBMZ"   .res Assessment: Plan:     Salih was seen today for follow-up, depression, anxiety, medication reaction and agitation.  Diagnoses and all orders for this visit:  Bipolar II disorder (HCC) -     QUEtiapine (SEROQUEL XR) 200 MG 24 hr tablet; 2 tablets in evening for 3 weeks then 3 tablets in the evening.  GAD (generalized anxiety disorder)  Mixed obsessional thoughts and acts  OSA (obstructive sleep apnea)  Lithium use  Low vitamin D level  Low serum vitamin B12  Alcohol dependence in remission (HCC)    Chronic recurrent rumination with anxiety and urgency and sense of need and instablity with impatience ongoing with frequent phone calls between appts. but  but anhedonic depression and anxious.  Needs a lot of reassurance. Chronic worry and overwhelmed easily and misattributes sx as SE of meds leading to inadequate med trials.  Fear of meds.Falsely attributes his psych sx to the meds and then stops meds AMA.  False attribution of sx as SE.  But overall this is less of a problem  than it was. Discussed the use of a mood chart which may help address some of this part attribution and better delineate mood cycling.  Gave him a copy of the chart reviewed in detail with him as to how to keep it.  Anhedonic dep and anxiety.  Dep worse than anxiety.  Anxiety average.  Less restless.  Anxiety was better but not gone on fluvoxamine 150 mg .   he describe history other manic cycling symptoms including increased interest, increased goal-directed behaviors, increased urge to spend that will last for 6 weeks or so and then stop.  He has these cycles about 3 times a year.  This is suggestive of a bipolar predisposition which was confirmed by recent cycle into mania from clomipramine.  Continue CPAP use. New CPAP machine.  Discussed potential metabolic side effects associated with atypical antipsychotics, as well as potential risk for movement side effects. Advised pt to contact office if movement side effects occur.  He agrees.  Call if you have any problems with this. Best option not pursued.   Increase quetiapine ER  to  200 mg 2 tablets in evening for 3 weeks then 3 tablets in the evening.  He remains sober.    Bc late 2023 mania with clomipramine increased lithium 1200  mg daily .   He cut it back to 900 mg daily.  He admits to feeling worse when he stopped the lithium.  Call if sx get worse. Counseled patient regarding potential benefits, risks, and side effects of lithium to include potential risk of lithium affecting thyroid and renal function.  Discussed need for periodic lab monitoring to determine drug level and to assess for potential adverse effects.  Counseled patient regarding signs and symptoms of lithium toxicity and advised that they notify office immediately or seek urgent medical attention if experiencing  these signs and symptoms.  Patient advised to contact office with any questions or concerns. 02/02/21  02/02/21 lithium level 0.6 on 900 mg daily, normal B12 and  vitamin D 52 04/01/22 lithium level 0.9 on 1200 mg daily  02/02/21 lithium level 0.6 on 900 mg daily Check lithium level  Disc the off-label use of N-Acetylcysteine at 600 mg daily to help with mild cognitive problems.  It can be combined with a B-complex vitamin as the B-12 and folate have been shown to sometimes enhance the effect.  Partial benefit so continue NAC 1200 mg daily.  Continue vitamin D DT history level 30  Constipation management 1.  Lots of water 2.  Powdered fiber supplement such as MiraLAX, Citrucel, etc. preferably with a meal 3.  2 stool softeners a day 4.  Milk of magnesia or magnesium tablets if needed  Disc tx of recent URI  Try to stay as active as possible.  Please see After Visit Summary for patient specific instructions.  FU 6 weeks  Meredith Staggers, MD, DFAPA     Future Appointments  Date Time Provider Department Center  05/26/2023 10:30 AM Nafziger, Kandee Keen, NP [facility]  02/19/2024 10:30 AM [facility] VISIT [facility]    No orders of the defined types were placed in this encounter.       -------------------------------

## 2023-05-04 ENCOUNTER — Other Ambulatory Visit: Payer: Self-pay | Admitting: Adult Health

## 2023-05-18 ENCOUNTER — Other Ambulatory Visit: Payer: Self-pay | Admitting: Adult Health

## 2023-05-18 DIAGNOSIS — I10 Essential (primary) hypertension: Secondary | ICD-10-CM

## 2023-05-26 ENCOUNTER — Encounter: Payer: Self-pay | Admitting: Adult Health

## 2023-05-26 ENCOUNTER — Ambulatory Visit: Payer: Medicare Other | Admitting: Adult Health

## 2023-05-26 VITALS — BP 120/80 | HR 76 | Temp 98.0°F | Ht 75.0 in | Wt 281.0 lb

## 2023-05-26 DIAGNOSIS — F419 Anxiety disorder, unspecified: Secondary | ICD-10-CM

## 2023-05-26 DIAGNOSIS — G4733 Obstructive sleep apnea (adult) (pediatric): Secondary | ICD-10-CM | POA: Diagnosis not present

## 2023-05-26 DIAGNOSIS — F1010 Alcohol abuse, uncomplicated: Secondary | ICD-10-CM | POA: Diagnosis not present

## 2023-05-26 DIAGNOSIS — E559 Vitamin D deficiency, unspecified: Secondary | ICD-10-CM

## 2023-05-26 DIAGNOSIS — Z720 Tobacco use: Secondary | ICD-10-CM

## 2023-05-26 DIAGNOSIS — I340 Nonrheumatic mitral (valve) insufficiency: Secondary | ICD-10-CM | POA: Diagnosis not present

## 2023-05-26 DIAGNOSIS — Z125 Encounter for screening for malignant neoplasm of prostate: Secondary | ICD-10-CM

## 2023-05-26 DIAGNOSIS — Z0000 Encounter for general adult medical examination without abnormal findings: Secondary | ICD-10-CM | POA: Diagnosis not present

## 2023-05-26 DIAGNOSIS — I10 Essential (primary) hypertension: Secondary | ICD-10-CM

## 2023-05-26 DIAGNOSIS — E782 Mixed hyperlipidemia: Secondary | ICD-10-CM

## 2023-05-26 DIAGNOSIS — F3181 Bipolar II disorder: Secondary | ICD-10-CM | POA: Diagnosis not present

## 2023-05-26 DIAGNOSIS — F32A Depression, unspecified: Secondary | ICD-10-CM

## 2023-05-26 LAB — CBC
HCT: 43.8 % (ref 39.0–52.0)
Hemoglobin: 15.2 g/dL (ref 13.0–17.0)
MCHC: 34.7 g/dL (ref 30.0–36.0)
MCV: 91 fl (ref 78.0–100.0)
Platelets: 197 10*3/uL (ref 150.0–400.0)
RBC: 4.81 Mil/uL (ref 4.22–5.81)
RDW: 13.5 % (ref 11.5–15.5)
WBC: 6.4 10*3/uL (ref 4.0–10.5)

## 2023-05-26 LAB — COMPREHENSIVE METABOLIC PANEL
ALT: 39 U/L (ref 0–53)
AST: 18 U/L (ref 0–37)
Albumin: 4.4 g/dL (ref 3.5–5.2)
Alkaline Phosphatase: 97 U/L (ref 39–117)
BUN: 23 mg/dL (ref 6–23)
CO2: 26 meq/L (ref 19–32)
Calcium: 9.5 mg/dL (ref 8.4–10.5)
Chloride: 107 meq/L (ref 96–112)
Creatinine, Ser: 1.22 mg/dL (ref 0.40–1.50)
GFR: 65.48 mL/min (ref >60.00)
Glucose, Bld: 106 mg/dL — ABNORMAL HIGH (ref 70–99)
Potassium: 4.3 meq/L (ref 3.5–5.1)
Sodium: 142 meq/L (ref 135–145)
Total Bilirubin: 0.4 mg/dL (ref 0.2–1.2)
Total Protein: 7 g/dL (ref 6.0–8.3)

## 2023-05-26 LAB — LIPID PANEL
Cholesterol: 137 mg/dL (ref 0–200)
HDL: 34.9 mg/dL — ABNORMAL LOW (ref >39.00)
LDL Cholesterol: 79 mg/dL (ref 0–99)
NonHDL: 102.12
Total CHOL/HDL Ratio: 4
Triglycerides: 116 mg/dL (ref 0.0–149.0)
VLDL: 23.2 mg/dL (ref 0.0–40.0)

## 2023-05-26 LAB — TSH: TSH: 2.17 u[IU]/mL (ref 0.35–5.50)

## 2023-05-26 LAB — PSA: PSA: 0.52 ng/mL (ref 0.10–4.00)

## 2023-05-26 LAB — VITAMIN D 25 HYDROXY (VIT D DEFICIENCY, FRACTURES): VITD: 30.03 ng/mL (ref 30.00–100.00)

## 2023-05-26 MED ORDER — AMLODIPINE BESYLATE 5 MG PO TABS: 5.0000 mg | ORAL_TABLET | Freq: Every day | ORAL | 3 refills | Status: AC

## 2023-05-26 MED ORDER — ATORVASTATIN CALCIUM 40 MG PO TABS: 40.0000 mg | ORAL_TABLET | Freq: Every day | ORAL | 3 refills | Status: DC

## 2023-05-26 NOTE — Patient Instructions (Signed)
 It was great seeing you today   We will follow up with you regarding your lab work   Please let me know if you need anything   Please call GI to schedule your colonoscopy  [phone] option 1

## 2023-05-26 NOTE — Progress Notes (Addendum)
 Subjective:    Patient ID: Jonathon Walker, male    DOB: Nov 03, 1965, 58 y.o.   MRN: [id number]  HPI Patient presents for yearly preventative medicine examination. He is a 58 year old male who  has a past medical history of Anxiety, Bimalleolar fracture of left ankle, Depression, Heart murmur, and Sleep apnea.  Anxiety and Depression/Bipolar II disorder -managed by psychiatry. Currently prescribed Luvox 100 mg daily, lithium 900 mg at bedtime and Seroquel. He does feel like he is less tired with the medication changes.   Alcohol Abuse - sober four years.   Tobacco Use - Continues to smoke. He understands he needs to quit smoking.   Sleep Apnea -uses CPAP nightly.  Does feel as though he gets a full night sleep, usually around 8 or 9 hours.  Has intermittent episodes of chronic fatigue   Essential Hypertension -takes Norvasc 2.5 mg daily.  Does not monitor his blood pressure at home.  Denies dizziness, lightheadedness, chest pain, shortness of breath, or syncopal episodes BP Readings from Last 3 Encounters:  05/26/23 120/80  07/21/22 130/82  08/04/21 124/76   Hyperlipidemia - takes Lipitor 40 mg. Denies myalgia or fatigue  Lab Results  Component Value Date   CHOL 145 06/16/2020   HDL 33.50 (L) 06/16/2020   LDLCALC 84 06/16/2020   LDLDIRECT 204.0 05/31/2018   TRIG 138.0 06/16/2020   CHOLHDL 4 06/16/2020   Vitamin D deficiency  Last vitamin D Lab Results  Component Value Date   VD25OH 38.70 07/21/2022   Mitral Regurgitation-had an echo in 2021 which showed a normal LVEF and mild to moderate MR.  He was advised to follow-up with cardiology in 1 year but this appointment was never scheduled.  He would like to follow-up with cardiology.  He has not had any shortness of breath past baseline from smoking.  Has not had any palpitations.  All immunizations and health maintenance protocols were reviewed with the patient and needed orders were placed.  Appropriate screening laboratory  values were ordered for the patient including screening of hyperlipidemia, renal function and hepatic function. If indicated by BPH, a PSA was ordered.  Medication reconciliation,  past medical history, social history, problem list and allergies were reviewed in detail with the patient  Goals were established with regard to weight loss, exercise, and  diet in compliance with medications  He is overdue for colon cancer screening - his last was in 2021 with multiple polyps.   Review of Systems  Constitutional:  Positive for fatigue.  HENT: Negative.    Eyes: Negative.   Respiratory:  Positive for shortness of breath.   Cardiovascular: Negative.   Gastrointestinal: Negative.   Endocrine: Negative.   Genitourinary: Negative.   Musculoskeletal: Negative.   Skin: Negative.   Allergic/Immunologic: Negative.   Neurological: Negative.   Hematological: Negative.   Psychiatric/Behavioral:  Positive for dysphoric mood. The patient is nervous/anxious.   All other systems reviewed and are negative.  Past Medical History:  Diagnosis Date   Anxiety    Bimalleolar fracture of left ankle    Depression    Heart murmur    Sleep apnea    uses CPAP nightly    Social History   Socioeconomic History   Marital status: Single    Spouse name: Not on file   Number of children: Not on file   Years of education: Not on file   Highest education level: Some college, no degree  Occupational History   Not on file  Tobacco Use   Smoking status: Every Day    Current packs/day: 1.50    Average packs/day: 1.5 packs/day for 48.2 years (72.3 ttl pk-yrs)    Types: Cigarettes    Start date: 03/22/1975   Smokeless tobacco: Never   Tobacco comments:    He reports he has quit 3 times and recently started back in 2012. Hsm   Vaping Use   Vaping status: Never Used  Substance and Sexual Activity   Alcohol use: Not Currently   Drug use: No   Sexual activity: Not on file  Other Topics Concern   Not on file   Social History Narrative   Not on file   Social Drivers of Health   Financial Resource Strain: Low Risk  (04/25/2023)   Overall Financial Resource Strain (CARDIA)    Difficulty of Paying Living Expenses: Not very hard  Recent Concern: Financial Resource Strain - Medium Risk (02/14/2023)   Overall Financial Resource Strain (CARDIA)    Difficulty of Paying Living Expenses: Somewhat hard  Food Insecurity: No Food Insecurity (04/25/2023)   Hunger Vital Sign    Worried About Running Out of Food in the Last Year: Never true    Ran Out of Food in the Last Year: Never true  Transportation Needs: No Transportation Needs (04/25/2023)   PRAPARE - Administrator, Civil Service (Medical): No    Lack of Transportation (Non-Medical): No  Physical Activity: Inactive (04/25/2023)   Exercise Vital Sign    Days of Exercise per Week: 0 days    Minutes of Exercise per Session: 10 min  Stress: Stress Concern Present (04/25/2023)   [employer] of Occupational Health - Occupational Stress Questionnaire    Feeling of Stress : Very much  Social Connections: Moderately Isolated (04/25/2023)   Social Connection and Isolation Panel [NHANES]    Frequency of Communication with Friends and Family: Three times a week    Frequency of Social Gatherings with Friends and Family: Once a week    Attends Religious Services: Never    Database administrator or Organizations: Yes    Attends Engineer, structural: More than 4 times per year    Marital Status: Never married  Intimate Partner Violence: Not At Risk (02/15/2023)   Humiliation, Afraid, Rape, and Kick questionnaire    Fear of Current or Ex-Partner: No    Emotionally Abused: No    Physically Abused: No    Sexually Abused: No    Past Surgical History:  Procedure Laterality Date   ORIF ANKLE FRACTURE Left 09/19/2017   Procedure: OPEN REDUCTION INTERNAL FIXATION (ORIF) ANKLE FRACTURE;  Surgeon: Sheral Apley, MD;  Location: [facility];  Service: Orthopedics;  Laterality: Left;   TOOTH EXTRACTION      Family History  Problem Relation Age of Onset   Cancer Maternal Grandfather    Colon cancer Neg Hx    Colon polyps Neg Hx    Esophageal cancer Neg Hx    Rectal cancer Neg Hx    Stomach cancer Neg Hx     No Known Allergies  Current Outpatient Medications on File Prior to Visit  Medication Sig Dispense Refill   amLODipine (NORVASC) 5 MG tablet TAKE 1 TABLET BY MOUTH EVERY DAY 30 tablet 0   atorvastatin (LIPITOR) 40 MG tablet TAKE 1 TABLET(40 MG) BY MOUTH DAILY 30 tablet 0   fluvoxaMINE (LUVOX) 100 MG tablet Take 1.5 tablets (150 mg total) by mouth at bedtime. (Patient taking  differently: Take 100 mg by mouth at bedtime.) 135 tablet 0   lithium carbonate 300 MG capsule Take 3 capsules (900 mg total) by mouth at bedtime. 270 capsule 0   QUEtiapine (SEROQUEL XR) 200 MG 24 hr tablet 2 tablets in evening for 3 weeks then 3 tablets in the evening. 90 tablet 1   No current facility-administered medications on file prior to visit.    BP 120/80   Pulse 76   Temp 98 F (36.7 C) (Oral)   Ht 6\' 3"  (1.905 m)   Wt 281 lb (127.5 kg)   SpO2 94%   BMI 35.12 kg/m       Objective:   Physical Exam Vitals and nursing note reviewed.  Constitutional:      General: He is not in acute distress.    Appearance: Normal appearance. He is obese. He is not ill-appearing.  HENT:     Head: Normocephalic and atraumatic.     Right Ear: Tympanic membrane, ear canal and external ear normal. There is no impacted cerumen.     Left Ear: Tympanic membrane, ear canal and external ear normal. There is no impacted cerumen.     Nose: Nose normal. No congestion or rhinorrhea.     Mouth/Throat:     Mouth: Mucous membranes are moist.     Pharynx: Oropharynx is clear.  Eyes:     Extraocular Movements: Extraocular movements intact.     Conjunctiva/sclera: Conjunctivae normal.     Pupils: Pupils are equal, round, and reactive to  light.  Neck:     Vascular: No carotid bruit.  Cardiovascular:     Rate and Rhythm: Normal rate and regular rhythm.     Pulses: Normal pulses.     Heart sounds: Murmur heard.     No friction rub. No gallop.  Pulmonary:     Effort: Pulmonary effort is normal.     Breath sounds: Normal breath sounds.  Abdominal:     General: Abdomen is flat. Bowel sounds are normal. There is no distension.     Palpations: Abdomen is soft. There is no mass.     Tenderness: There is no abdominal tenderness. There is no guarding or rebound.     Hernia: No hernia is present.  Musculoskeletal:        General: Normal range of motion.     Cervical back: Normal range of motion and neck supple.  Lymphadenopathy:     Cervical: No cervical adenopathy.  Skin:    General: Skin is warm and dry.     Capillary Refill: Capillary refill takes less than 2 seconds.  Neurological:     General: No focal deficit present.     Mental Status: He is alert and oriented to person, place, and time.  Psychiatric:        Mood and Affect: Mood normal.        Behavior: Behavior normal.        Thought Content: Thought content normal.        Judgment: Judgment normal.       Assessment & Plan:  1. Routine general medical examination at a health care facility (Primary) Today patient counseled on age appropriate routine health concerns for screening and prevention, each reviewed and up to date or declined. Immunizations reviewed and up to date or declined. Labs ordered and reviewed. Risk factors for depression reviewed and negative. Hearing function and visual acuity are intact. ADLs screened and addressed as needed. Functional ability and level of  safety reviewed and appropriate. Education, counseling and referrals performed based on assessed risks today. Patient provided with a copy of personalized plan for preventive services. - Phone number given for [facility] to call and get scheduled for colonoscopy  - Can get shingles  vaccination when he is ready  - Work on weight loss through diet and exercise - Follow up in one year or sooner if needed  2. Bipolar II disorder (HCC) - Per psychiatry  - Lipid panel; Future - TSH; Future - CBC; Future - Comprehensive metabolic panel; Future - Comprehensive metabolic panel - CBC - TSH - Lipid panel  3. Anxiety and depression - Per psychiatry  - Lipid panel; Future - TSH; Future - CBC; Future - Comprehensive metabolic panel; Future - Comprehensive metabolic panel - CBC - TSH - Lipid panel  4. Alcohol abuse - Congratulated on being sober for 4 years - Lipid panel; Future - TSH; Future - CBC; Future - Comprehensive metabolic panel; Future - Comprehensive metabolic panel - CBC - TSH - Lipid panel  5. OSA (obstructive sleep apnea) - Continue with CPAP - Lipid panel; Future - TSH; Future - CBC; Future - Comprehensive metabolic panel; Future - Comprehensive metabolic panel - CBC - TSH - Lipid panel  6. Essential hypertension - Well controlled. No change in medication  - amLODipine (NORVASC) 5 MG tablet; Take 1 tablet (5 mg total) by mouth daily.  Dispense: 90 tablet; Refill: 3 - Lipid panel; Future - TSH; Future - CBC; Future - Comprehensive metabolic panel; Future - Comprehensive metabolic panel - CBC - TSH - Lipid panel  7. Mixed hyperlipidemia - Continue Lipitor  - atorvastatin (LIPITOR) 40 MG tablet; Take 1 tablet (40 mg total) by mouth daily.  Dispense: 90 tablet; Refill: 3 - Lipid panel; Future - TSH; Future - CBC; Future - Comprehensive metabolic panel; Future - Comprehensive metabolic panel - CBC - TSH - Lipid panel  8. Vitamin D deficiency - Consider increase in Vit. D - VITAMIN D 25 Hydroxy (Vit-D Deficiency, Fractures); Future - VITAMIN D 25 Hydroxy (Vit-D Deficiency, Fractures)  9. Prostate cancer screening  - PSA; Future - PSA  10. Mitral valve insufficiency, unspecified etiology  - Ambulatory referral to  Cardiology  11. Tobacco use - Encouraged to quit  - CT CHEST LUNG CA SCREEN LOW DOSE W/O CM; Future  Shirline Frees, NP

## 2023-05-30 ENCOUNTER — Telehealth: Payer: Self-pay

## 2023-05-30 NOTE — Telephone Encounter (Signed)
 Copied from CRM [id number]. Topic: Clinical - Lab/Test Results >> May 30, 2023 10:56 AM Isabell A wrote: Reason for CRM: Patient returning call for lab results - advised message about Vit D, patient understood and had no additional questions.

## 2023-06-02 ENCOUNTER — Telehealth: Payer: Self-pay | Admitting: *Deleted

## 2023-06-02 NOTE — Telephone Encounter (Signed)
 Copied from CRM [id number]. Topic: Clinical - Medication Question >> Jun 02, 2023  2:53 PM Sonny Dandy B wrote: Reason for CRM: pt called to speak with provider regarding the Increase in vitamin D pt states he was not taking them at the time of the blood test.. Pt is requesting a call back at [phone]  To discuss the increase/

## 2023-06-06 ENCOUNTER — Telehealth: Payer: Self-pay | Admitting: Psychiatry

## 2023-06-06 NOTE — Telephone Encounter (Signed)
 Pt Lvm  @ 12:22p that he is out of his Seroquel and the pharmacy is telling him there's an issue with the insurance and him taking 3 pills of 200mg  a day.  (I assume he's saying they are wanting a PA).     Next appt 3/27

## 2023-06-06 NOTE — Telephone Encounter (Signed)
 Needs PA for Seroquel XL, taking #90/30. Pt had called and I confirmed with pharmacy and it is now in [facility].

## 2023-06-07 NOTE — Telephone Encounter (Signed)
 Pt was advised of this 3/11. Pt called back to see if he should only take 1000 unts. Pt was advised to follow Cory's orders of 5000 daily. Pt verbalized understanding.

## 2023-06-08 NOTE — Telephone Encounter (Signed)
PA pending with [facility]

## 2023-06-08 NOTE — Telephone Encounter (Signed)
 Prior approval received with [employer] effective through 03/20/2024, PA# [id number] for Quetiapine ER 200 mg #90/30 day.

## 2023-06-15 ENCOUNTER — Encounter: Payer: Self-pay | Admitting: Psychiatry

## 2023-06-15 ENCOUNTER — Ambulatory Visit (INDEPENDENT_AMBULATORY_CARE_PROVIDER_SITE_OTHER): Payer: Medicare Other | Admitting: Psychiatry

## 2023-06-15 DIAGNOSIS — R7989 Other specified abnormal findings of blood chemistry: Secondary | ICD-10-CM

## 2023-06-15 DIAGNOSIS — F422 Mixed obsessional thoughts and acts: Secondary | ICD-10-CM | POA: Diagnosis not present

## 2023-06-15 DIAGNOSIS — G4733 Obstructive sleep apnea (adult) (pediatric): Secondary | ICD-10-CM

## 2023-06-15 DIAGNOSIS — F411 Generalized anxiety disorder: Secondary | ICD-10-CM | POA: Diagnosis not present

## 2023-06-15 DIAGNOSIS — E538 Deficiency of other specified B group vitamins: Secondary | ICD-10-CM

## 2023-06-15 DIAGNOSIS — F3181 Bipolar II disorder: Secondary | ICD-10-CM | POA: Diagnosis not present

## 2023-06-15 DIAGNOSIS — Z79899 Other long term (current) drug therapy: Secondary | ICD-10-CM

## 2023-06-15 MED ORDER — LITHIUM CARBONATE 300 MG PO CAPS: 900.0000 mg | ORAL_CAPSULE | Freq: Every evening | ORAL | 0 refills | Status: DC

## 2023-06-15 MED ORDER — FLUVOXAMINE MALEATE 100 MG PO TABS: 100.0000 mg | ORAL_TABLET | Freq: Every day | ORAL | 0 refills | Status: DC

## 2023-06-15 MED ORDER — QUETIAPINE FUMARATE ER 300 MG PO TB24
600.0000 mg | ORAL_TABLET | Freq: Every day | ORAL | 0 refills | Status: AC
Start: 2023-06-15 — End: ?

## 2023-06-15 NOTE — Progress Notes (Signed)
 Jonathon Walker [id number] Jan 05, 1966 58 y.o.  Subjective:   Patient ID:  Jonathon Walker is a 58 y.o. (DOB 08-Sep-1965) male.  Chief Complaint:  Chief Complaint  Patient presents with   Follow-up   Depression   Anxiety    Jonathon Walker presents to the office today for follow-up of anxiety , depression, alcohol.    seen August 7.  His anxiety was unmanaged at Paxil 40 mg and olanzapine 5 mg so olanzapine was increased to 7.5 daily.  seen December 17, 2018.  His anxiety was unmanaged.  The following change was made: Trial 1-1/2 of the 7.5 mg tablets of olanzapine for anxi.  He is had a stay at [facility] for alcohol dependence since he was last here. Stayed 28 days and DC before Thanksgiving.  Very helpful.  Was having NM nighly before and they stopped since then.  Not drinking.  Involved in AA since then.  seen March 25, 2019.  The patient was doing better requested a reduction in olanzapine to 7.5 mg nightly to improve energy.  visit April 03, 2019.  He had remained sober and his anxiety was improved with sobriety.  He was having problems with low motivation and forgetfulness.  At the next refill it was decided olanzapine would be reduced from 7.5 to 5 mg daily. He called requesting this urgent appointment because of worsening symptoms associated with returning to work.  seen May 21, 2019.  He was doing relatively well.  The following was noted: Tolerating Wellbutrin and it seems to help. He didn't tolerate the increase. Anxiety is improved off alcohol.  Ok to reduce olanzapine to 2.5 mg daily for a month and stop it.  Hopefully low motivation will improve.   He called back March 23 stating he was more depressed and having a harder time doing routine tasks and having suicidal thoughts.  Because of worsening depression after reducing the olanzapine he was encouraged to increase olanzapine back to 5 mg nightly. He called again March 24 stating he was really struggling but  was scheduled for an appointment on March 26.  As of June 14, 2019 he reports the following: Every day a real hard struggle to get through the days.  More anxious and depressed equally. Worse 2 weeks.  Last Saturday so overwhelmed by how he feels he had SI.  Everything is hard. Initial benefit paroxetine has been lost.  Awakens stressed and tired. Adequate hours of sleep.  Will be major chore just to mow grass.  Still sober. SE sexual. Getting appt with CPAP doc. Changes made include: Increasing olanzapine back to 5 mg daily a couple of days prior to the appointment due to phone call and the addition of lithium 300 mg for a few days then increase to 600 mg nightly both to augment the antidepressant and because of suicidal thoughts.  July 15, 2019 appointment, the following is noted: Doing OK with both depression and anxiety.  Kind of like a top always going in mind.  Even when I sleep, when awakens mind still going.  NM nightly for years but less than before stopped drinking.  Smaller and less severe ones that don't wake him.  Theme can't finish things.  Both depression and anxiety 3/10.  More productive.  Paces himself.  Can live with it.  Sleep 9 hours.  Initially olanzapine stopped the ruminating but it's back some.  Overall still benefit.  No SI and can't rmember why he had them before. Residual  energy not great and some ruminating. He had some concerns about sexual side effects from Paxil but agreed that no med changes were necessary despite residual symptoms of depression and anxiety.  08/14/2019 appointment, the following is noted:  He scheduled urgently. Vaccinated. Angry easily and exhausted and everything is a chore.  Exhausted. Never had appt with CPAP company.  CPAP is 59 years old and on same settings as in the past.  CPAP machine is not working properly but when it did it was helpful for alertness and energy.  Disc need to call them to have it evaluated. To bed 10-7.  Don't feel rested  in morning but used to feel rested when first started paroxetine. Plan: Increase Lithium continue to 900 mg daily for irritability  11/19/19 appt with the following noted: He increased to 900 mg and then reduced it but doesn't know why.  Reduced it to 300 nightly. Irritability much better.  Once anger since here with rage but not outward. Still on paroxetine 40, olanzapine 5, Wellbutrin 75 AM. Getting tired in afternoon and worrying about getting depressed but he's not depressed now.  Don't enjoy things like he used to.  Les interest and excitement. Appetite and sleep are normal.   Still adjusting without alcohol and socialization. Worrying about lack of sex drive with paroxetine. Can still ruminate on things until he gets some reassurance through talking with people. Plan: Continue low-dose lithium 300 mg daily Continue Paxil.  Did not feel well at 60 mg so we will continue 40 mg.  Likely to lose anxiety benefit if change it. Continue low-dose Wellbutrin 75  Tolerating Wellbutrin and it seems to help. He didn't tolerate the increase.   Continue olanzapine 5 mg  02/18/2020 appointment with following noted: Open to trying increase paroxetine again bc ruminating wears him out and gets fatigued and it wears him out though is 80% better vs before paroxetine. Doesn't remember trying higher paroxetine.  Ruminates on relationships or work.  Whenever he can talk about it with someone it helps tremendously but also realizes paroxetine helps. Anxiety affects dreams and NM too.  Up and down a little.  Anxiety drove depression before paroxetine.  Best med he's tried. SOBER 1 YEAR SE no problem.  Except gained 25# in 4 years.  No change in diet and exercise.  No increase in appetite and no food cravings.  Seeing PCP soon. Plan: Increase paroxetine trial to 60 for rumination.  Disc SE.  05/26/20 appt noted: No benefit increase paroxetine nor SE.  Wants to reduce back to 30 mg paroxetine and stop lithium bc  it didn't seem to help.   4 days of Calm app has seemed to help rumination.  Does it in afternoon and before bed and it seemed to helps.  Doing a 10 min meditation and it helps.   Dep 4/10.  Anxiety 4/10. And a lot better the last few days.   Plan: Reduce lithium by 1 tablet per week.  Call if there is any suicidal thought. Starting April 1 reduce paroxetine to 40 mg daily. Continue low-dose Wellbutrin 75  Tolerating Wellbutrin and it seems to help. He didn't tolerate the increase.   If doing OK will try taper as he suggested at next visit. Continue olanzapine 5 mg nightly it was helpful for depression and anxiety.  07/01/2020 appt with following noted: Moved appt up. Now says he understated depression level when he was here the last time. Restarted lithium bc felt more depressed without  it. Reduced paroxetine to 40 mg daily. Lethargic.  Mind won't calm down.  Nonrestorative sleep but 8 hours.  Doesn't think sleep is deep enough.  Tense. Goes to [employer]. Started counseling. Wants to try something different with meds. Can have mixed sx for 6 weeks with increased interest in things and motivation and want to spend money but still feels depressed.  3 times in the last year. Plan: Increase  lithium back to 3 daily to see if depression is better and less mood cycling.  He admits to feeling worse when he stopped the lithium Continue the reduce paroxetine to 45 mg daily. DC Wellbutrin Lamotrigine trial 25 mg to 100 mg daily over 4 weeks. Continue olanzapine 5 mg nightly it was helpful for depression and anxiety.  08/26/2020 appointment with the following noted: Sad and down.  Classic depression sx and very fatigued.  Unusual to have classic depression bc usually is atypical.  No effect from lamotrigine. Wonders if atorvastatin is causing some confusion or depression bc read about it. Depressed for 3 mos. Gained 20#.  It's leveled off now. Olanzapine helped the rumination and anxiety.    Life is more depressing bc not seeing friends like it was bc stopped drinking.   No SE with lithium.  Unless dropping things. Sleep 1030 to 6 but doesn't sleep deep.  Not rested in AM Plan: Increase  lithium back to 3 daily to see if depression is better and less mood cycling.  He admits to feeling worse when he stopped the lithium Continue the reduce paroxetine to 40 mg daily bc it helped anxiety. Lamotrigine increase to 150 mg daily DT no benefit or SE Increase olanzapine10 mg nightly it was helpful for depression and anxiety.  Need more help with depression   09/11/2020 phone call: Pt stated he is foegetting things throughout the day and very unsteady.He works with tools and equptment and this is a problem.He stated he takes 6 Lamictal in the am. MD response: We made 3 med changes at the last visit including increasing lamotrigine to 150 mg daily, increasing olanzapine to 10 mg daily, and restarting lithium.  Any 1 of those changes could possibly cause some of the side effects.  We will have to make 1 change at a time to evaluate this.  Therefore reduce lamotrigine to 100 mg daily.  It may take a couple of weeks before he sees a difference.  11/13/2020 phone call: Patient lm stating he is currently taking Lamotrigine 150 mg. He has decreased it to 75 mg due to side effects per pt ( blurred vision, shaking and coordination issues). Patient stated he is discontinuing before his scheduled October appointment. MD response: Take the lamotrigine 75 mg for 2 weeks and he should be able to stop then without withdrawal.  If he gets shakey, nervous then we need to reduce more slowly and let us know that.  Otherwise he can stop if he follows these directions.  11/30/2020 phone call: Pt called and advised he would like to taper off of the Olanzapine.  He has gained 8# since his last visit and 30# total since he started taking the med.  He feels steady, not so much stress now, and would like to see if he can take  Lorazepam instead.  Then talk to Dr. Jennelle Human in Oct when they meet. MD response: Reduce olanzapine to one half of the 10 mg tablet at night for 4 weeks and then stop it.  If he gets  more depressed then get back in touch with [place] and we will try to do something about the weight gain that olanzapine can cause by using the alternative Lybalvi  12/07/2020 appointment with the following noted: Off lamotrigine .  Reduced olanzapine to 5 mg for a week.  On lithium 900 mg HS, paroxetine 40 Fatigue 10/10.  Dizziness resolved off lamotrigine.  Low fatigue and motivation.  "I want to want to do things".  Stopped drinking almost 18 mos and socially big adjustment.   Still invloved with AA and daily sponsor.  Working [employer].  Jonathon Walker helped. Thinks mild depression with little interest and motivation and no joy.  But not severe.  No SI. No anxiety and it's way better.  Life is more stable and helps.  Working 4 hours daily and occ extrea jobs. Uses new CPAP.  Stil drowsy and fatigue. Plan: Continue CPAP use. New CPAP machine. Trial modafinil 100-200 mg AM Lamotrigine off DT NR Reduced olanzapine to 5 mg daily for a week and well stop in 3 weeks DT wt gain and fatigue  01/20/2021 phone call: I called patient to see if I could get better information. He states he started modafinil a couple of weeks ago, though Rx was written for in September. He states he didn't feel well before taking the modafinil though. He just says he is agitated, irritable, and unstable feeling. When asked what he meant by unstable he stated feeling a "shell of myself".  He has been put on the cancellation list.  MD response: It is likely the modafinil that is causing this problem.  But it may be corrected with a dose reduction.  I assume he is taking a whole tablet.  If that is the case time to reduce to 1/2 tablet in the morning.  If he is taking half a tablet tell him to reduce it to a quarter of a tablet.  And I agree we need to try to get  him in as soon as possible. He reduced modafinil from 200 mg to 100 mg daily.  02/10/2021 appointment with the following noted: Current psych meds lithium 900 mg nightly, paroxetine 40 mg daily, olanzapine recently stopped, modafinil recently tried. Was really bad for awhile feeling depresssed and agitated and anxiety but better now including after reducing modafinil to 100 mg daily. Fatigue is better and in the middle now with energy, not good but not bad. Sleep is better.  CPAP doctor yesterday and they are gathering info pending. NAC is helping with memory. Still sober and committed to it. SE constipation  Plan: No med changes  03/10/2021 phone call from patient complaining of vague feelings of not feeling well and wanting the Paxil changed.  He was placed on the cancellation list.  03/18/2021 appointment urgently scheduled at his request: Remains on paroxetine 40 mg daily, lithium 1200 mg daily, modafinil 200 mg every morning. "Ruminating"  with sense of need to talk to someone.  Then feels better for awhile.  Mostly on relationships with family.  No one to talk to. Wonders need to chang ethe meds.   Don't socialize anymore since sober.  Does online AA everyday at noon. Lost 10# off olanzapine. Plan: Continue paroxetine 40 mg, lithium 1200 mg daily, Try stopping modafinil 100mg  AM to determine if it is helping or not. If not less anxious then start risperidone for rumination 1-2 mg HS  04/05/2021 appointment with the following noted: Stopped  modafinil and it helped reduce anxiety. Started  risperidone 2 instead of 1 mg and was off balance so reduced risperidone to 1 mg HS. Only done this for 3 nights.  No SE with it. Anxiety is not close to as bad as originally.  Can be painful but not now. Still ruminating some "my whole adult life" but varies.  Anxiety can lead to depression. 6-7/10 with 10 good on depression with some problems with ambition. Plan: Continue trial risperidone for  rumination 1 mg HS  04/07/2021 TC: Several phone calls: Called 2 days after the visit complaining of itching from the lithium and wanting to come off of it.  He was warned he was more emotionally distressed when he reduced the lithium in the past but he wanted to pursue it anyway.  He was continuing risperidone 1 mg nightly but wanting to reduce that also but was warned he would almost certainly have worsening mood severity and anxiety problems based on past experience.  He was cautioned against reducing lithium below 900 mg daily and risperidone below 1 mg nightly.  He agreed on 04/09/2021.  04/09/2021 he called again and several phone calls ensued thereafter.  He insisted on stopping lithium and risperidone despite warnings that his mood stability and anxiety would worsen. He was prescribed Depakote to take the place  04/20/2021 appointment with the following noted: He stopped risperidone and did not take the Depakote.  He is taking lithium 900 mg nightly and paroxetine 40 mg daily. Stopped risperidone bc feeling agitated and unstable and blamed it.  Still feels the same off of it. Says itching 85% better with less lithium.  Itching for a couple of mos.  Doesn't think anxiety noticeably worse after reducing lithium to 900 mg daily. Not dizzy.  Clumsy with hands. Anxious but not painful like it was before Paxil but tense at the end of the day.  Initially felt normal for the first time in 20 years when first started paxil but not as good now. Not sadness and hasn't had much of it. Started therapy with doctor on demand. Ruminating on "fear of not being fully present" and worries about little things like appts pending.   Afraid of working FT because gets overwhelmed bc "I take it home with me" Plan : Continue trial risperidone but increase to rumination 1.5 mg HS Continue paroxetine 40 and lithium 600 mg   06/01/2021 appointment with the following noted: Haven't seen benefit with risperidone 1.5 mg  daily. Not in pain but feels disconnected and not myself.  Has felt like this off and on for years. Level of angst all day but not real high unless triggered.  Still has the rumination. Initially a lot of benefit from paroxetine Not markedly deprssed and not sad.  07/14/21 appt noted:  Doesn't feel much different with switch to fluvoxamine except anxiety well controlled. But still low energy, motivation and not as productive as he wants to be.  Not markedly sad.  Asks about Vraylar. Not much interest or enjoyment. Don't sleep that great and don't relax that great. On fluvoxamine 200, lithium 900, off risperidone. Plan: Anxiety controlled by fluvoxamine 200 Potentiate Vraylar 1.5 mg daily  07/27/2021 phone call complaining of ongoing depression.  Vraylar increased to 3 mg daily. 07/29/2021 phone call complaining of cost of Vraylar.  Was given samples. 08/20/2021 phone call complaining of fatigue.  Stated he stopped the Vraylar because it made him agitated.  It was recommended there be no further med changes because he is only been off the [employer]  for 1 week.  08/30/2021 appointment noted: Vraylar 1.5 mg didn't do much.  Increased to 3 mg and then felt irritable ans stopped it about 3rd week of May.  Better now. Still extremely tired and feels physically sick with normal workup. Thinks his mind wears him out.  Racing negative thoughts all the time. Asks about Ativan. Always nervous and anxious.  Trouble discerning anxiety from depression. Plan: Anxiety was controlled by fluvoxamine 200 but now he thinks it is worse. So increase 250 mg daily  10/18/21 appt noted: No results with increase fluvoxamine.  No SE. Anxiety is still worse than depression.  Thinks it causes somatic sx like the flu.  Exhausted. GI. Poor sleep and concentration. Asks about lorazepam.   Got checked out by PCP without findings. Plan: Reduce lithium to 2 capsules daily to see if physical symptoms like fatigue etc. are  better Increase fluvoxamine to 1 tablet in the morning and 2 tablets in the evening to reduce anxiety Plan Trial reduction of  lithium 900  to 600 mg daily to see if somatic sx are better like fatigue.   01/06/22 appt noted" Everything is about the same with med changes. CC anxiety and fatigue.  Sx started with severe depression in 20s and got better but never resolved. Thinks anxiety is causing fatigue.  Has to nap about 1 pm.  Easily fatigued.  No particular worry other than how he feels and himself.. Plan: Reduce fluvoxamine gradually to 150 mg daily Start clomipramine 25 mg nightly for 5 days and increase to 75 mg HS  02/07/22 TC:  Pt lvm that the clomiprame 25 mg has been taking the full dose for two weeks now. He is experiencing mania. Overspending etc    MD resp:  Increase the lithium back to 3 daily.  Stop the clomipramine.  Resume the risperidone until these symptoms clear up.  Call us back next week and let us know how it is going.     02/16/22 TC:   Pt was just calling to give you an update.He stated he is still 50 percent manic but feels better.He is currently taking   lithium carbonate 300 MG 3 daily risperiDONE 3 mg 1/2 tab daily fluvoxamine 100 MG tablet 1/2 tablet in the AM and 1 tablet at night On 11/20 changes were made,you had him stop clomipramine                     and increase lithium to 900 mg daily.  02/16/22 MD: check lithium level.  02/24/22 tC complaining of depression. MD :  He has had recent mood swings from manic to depressed by his report.  Lithium level is low at 0.5 on 600 mg daily.  I think the most helpful thing for his mood would be to increase lithium back to 900 mg daily.      03/07/22 TC: pt reported increasing his Luvox to 200 mg daily.  03/08/22 appt noted: Multiple phone calls since here.   Clomipramine triggered mania like he's never had before.  Increased over the top looking for a house, wanting to invent things, bought a shed and fish  tank abruptly.  Left his job which was a good thing.  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  I feel a lot better about it.  Has disability and will do PT work.  I'll be OK. Exhausted and can't be on his feet all the time.   Sleep is  different with EMA and ready to go but watches TV awhile and then goes back to sleep.  Always a late sleeper all his life.   Still feeling some depression and he increased the luvox to 200 and it helped. Thinks parents are Autistic and don't respond emotionally.  M cannot understand feelings and he doesn't get any support.   Plan: Bc recent mania with clomipramine increase lithium 1200  mg daily .    03/29/2022 appointment urgently due to recent mania: Psych meds: fluvoxamine 200, lithium 1200, risperidone 1.5 mg HS Exhausted until the last couple of days. SE tremor Has not had any water today and is lightheaded.   Mood a little better and anxiety better than it was.  No mania. Some underlying weight in mood that only responded to paroxetine and clomipramine. Asks about Wellbutrin for energy. Sleep normal 8-9 hours now.  Except last night. Goes to [facility] groups and support group and AA. Sober Reduce risperidone to 1 mg to see if energy better  05/25/22 appt noted: Reduced lithium about 10 days ago bc thought he might be getting too much bc read on the internet.  No changes since reducing it. More dep for a couple of mos which is unusual.  In bed a lot and more isolated and not normally what I have.  Not real sad but unmotivated and no energy to do things.    Start therapist Friday. No sig sadness.  Irritable and short with people over little things.  Kind of started after stopped drinking.  Worse.  Sleep on and off.  Erratic.  Looks for ward to night so can escape into sleep.   Anxious underlying for 30 years.  Hard to sit and relax and just watch TV.  Tends to ruminate. Asks to try Gaba. Now lethargy is worse than chronic anxiety Reducing risperidone made no  changes good or bad. DC risperidone 1 mg HS Add Latuda 1/2 tablet for 1 week then 1 tablet in the the evening  07/06/22 appt :  Increased Latuda up to 100 mg .  No clear SE. Exhausted mentally and it makes his body tired.  Working 10-2 and then comes home and lays in bed a couple of hours.  Not a deep sleep.  Never feels fully rested.  Sleep HS 10-7.   Normal appetitie.   Still some sadness and irritability.  Anxiety doesn't seem better but is having less mood swings and mania.  Anxiety wears him down.   Plan: Increase  Latuda 120 mg in the evening with food  07/21/22 appt noted: Trouble keeping mood chart.  If I talk and I'm heard then feels better for hours but then damn stopped up.  Almost like an obsessive thing to talk for relief. No clear effect of increase Latuda except reduced som cognitive anxiety but not physical anxiety. Says there was med he took before paxil that gave him instant relief and wonders about retrying it.  Doesn't know which med. Paxil had best effect when it worked for a period of time. Exhausted but fidget when sits.   Dep and anxious moderate.  09/01/22 appt noted: Meds clonazepam 0.5 mg BID prn, fluvoxamine 200 mg daily, lithium 900 HS, latuda 120 mg daily, vit D3 This type of dep since November after mania is low energy, anhedonia, low appetite, a lot of time in bed, "textbook" depresssion, lack of interest but not debilitating sadness.   Tired of it.  Dep is worse than anxiety and used  to be the other way around.  Not as irritable. In past dep was anxious and disconnected and wasn't as tired.  Had to leave work bc can't get himself out of bed.   Toss and turn in bed fidgety in bed, feels like has to move but no energy to walk.  Fidgety for 3 mos. Clonazepam takes the edge off but feels a higher dose is needed.  Plan: Changes: Reduce Latuda to 60 mg daily. Start samples of Auvelity 1 in the AM for dep that is TRD  10/20/22 appt noted: Meds clonazepam 0.5 mg BID  prn, fluvoxamine 200 mg daily, lithium 900 HS, latuda 60 mg daily, vit D3, out of Auvelity after 2 weeks. Less restless but still has it 6/10 after reduced Latuda.  Thinks he had it before eLatuda but not sure. Takes an hour to get to sleep.   No change in dep from this visit to last visit. Did have positive effects from [place] but ? SE conc.  Did help energy Lately more dep than anxiety.  Noted ASA helps anxiety. Couple times situational anger but no mood swings. Plan: Changes: Reduce fluvoxamine to 1 and 1/2 tablet daily to see if restlessness better Reduce Latuda from 60 to 40 mg daily to see if restlessness better Retry Auvelity 1 in the AM for 7 days then 1 twice daily  11/14/22 appt noted: Twitching and restlessness better.   Still some teeth grinding but better Auvelity didn't help mood.  Ongoing classic lethargy, poor appetite.  Can't exercise bc depression.  Little tasks overwhelming.    clonazepam 0.5 mg BID prn, fluvoxamine 150 mg daily, lithium 900 HS, latuda 40 mg daily, vit D3,  Auvelity BID SE constipation and bruxism. Sleep fair and best and most relaxed 730-930 AM.  To bed 10 pm.   12/26/22 appt noted: More relaxed if not underpressure but not working and needs to. Not much motivation or energy in AM.  Doesn't notice the effects of Seroquel for many hours, but it is relaxing med. For last 7 years hard time with motivation and capacity.   Happened years ago and then it resolved after 7 years with Wellbutrin Plan: Changes none:  Continue 1/2 quetiapine XR 150 mg PM.  This is helping him to feel more calm in the morning than he has felt in a very long time.  However he thinks now he is a little too subdued but does not want to do reduce the dose any further.  Does not tolerate a higher dose because it is too sedating.  01/09/23 TC:  Patient taking 75 mg of Seroquel and sleeping great at night, but also sleeping a lot during the day. He reports he doesn't want to be around  people, is content to be at home not doing anything. He rates depression as 7/10, anxiety 5/10. Reports anxiety is better since starting Seroquel. He also takes: Fluvoxamine 150 mg at bedtime Lithium 900 mg at bedtime, though 1200 is prescribed. No longer taking clonazepam.    MD resp:  There are a limited number of options left to [place] and it is important that each med be tried at his most effective dose.  Seroquel is FDA approved for depression in doses of 150 mg and higher.  The low dose he is currently on is enough to help anxiety and his sleep but probably not enough to help depression.  We may need to go as high as 200 or 300 mg a day.  He  is probably fearful of being too sedated at higher doses but this medicine does not necessarily make people more sleepy at higher doses.  I think she got to go up to the full quetiapine XR 150 mg daily and give that a try until his appointment with me.      02/08/23 appt noted: Psych med: no Clonazepam 0.5 mg twice daily as needed anxiety, fluvoxamine 150 nightly, lithium 900 nightly, quetiapine XR 150 at 3 pm Less am drowsiness taking Seroquel in the afternoon. Chronic fatigue unchanged.  Rare days without it.  Can do one task daily.  Can work 1 and 1/2 hours daily.  Naps about 2 pm.   Seroquel helps sleep and anxiety.   Easily overwhelmed if multiple tasks.   Feels much better in the AM than in years.  By noon is worn out.   Plan: Increase for TRD quetiapine XR 200 mg PM.   03/23/23 appt noted: Meds as above except incr Seroquel XR 200 mg daily. Anxiety better but still some dep with less motivation maybe 20% better. No sig SE except dryness. URI for 8 days. Sleep 10 hours per usual.   Much more relaxed with Seroquel. Plan: Increase further for TRD quetiapine XR 300 mg PM.   05/02/23 appt noted: Multiple phone calls since here complaining of ongoing sx and med rxns. Incr and decrease fluvoxamine to 150 now 100 complaining of no benefit for anxiety  at the higher dose and feeling somewhat agitated.   Current meds:.  Fluvoxamine 100 nightly, lithium 900 nightly, quetiapine ER 300 mg nightly. Felt annoyed, angry , agitated without reason and called.   Is a little better the last 10 days.  Not nearly as angry SE wt gain and constipation. Asks about switch to paxil bc helped the most of anything.   Speaking to counselor.  Fearful childhood. Plan: Increase quetiapine ER  to  200 mg 2 tablets in evening for 3 weeks then 3 tablets in the evening.   06/15/23 appt noted: Med: Seroquel XR 600, lithium 900, fluvoxamine 150. Vit D 5000 helped Overall is a little better.  Less foggy.  Less dep. 30-40% better with combo. SE gained another 5# Vit D helped fatigue.  Still naps but less. Sleep is OK. Less angry but still has shortness.  But not as bad.  On disability for 7-8 years.  Working PT [employer] for alcohol.  Sober 2 year 01/2019  Past psych meds:   Paxil 60 "too much" + wt gain,   duloxetine, Zoloft,  Viibryd 60 partial resp (trial after paroxetine) SE diarrhea. Trintellix 10 agitated Wellbutrin 75 (max tolerated),   Fluvoxamine 300 no better than 200 which seems to take edge off anxiety Clomipramine mania Auvelity 4 weeks NR  Abilify, Rexulti 2. Vraylar irritable at 3 mg daily.  Latuda 120 fidgety Olanzapine 10 fatigue Risperdal 2 once Seroquel XR 600 benefit partial  Buspar 30 BID stopped DT partial effect.  Xanax craving,   lithium  900, worse when he stopped lithium Lamotrigine NR CO dizzy  NAC 600 helped Modafinil 200 agitated Took lorazepam  in the past, Xanax less helpful  B severe OCD obsessions psych tx.  Review of Systems:  Review of Systems  Constitutional:  Positive for fatigue. Negative for unexpected weight change.  Cardiovascular:  Negative for palpitations.  Gastrointestinal:  Positive for constipation. Negative for nausea.  Neurological:  Negative for tremors.  Psychiatric/Behavioral:   Positive for dysphoric mood. Negative for agitation, behavioral problems,  confusion, decreased concentration, hallucinations, self-injury, sleep disturbance and suicidal ideas. The patient is nervous/anxious. The patient is not hyperactive.     Medications: I have reviewed the patient's current medications.  Current Outpatient Medications  Medication Sig Dispense Refill   amLODipine (NORVASC) 5 MG tablet Take 1 tablet (5 mg total) by mouth daily. 90 tablet 3   atorvastatin (LIPITOR) 40 MG tablet Take 1 tablet (40 mg total) by mouth daily. 90 tablet 3   Cholecalciferol (VITAMIN D3) 125 MCG (5000 UT) CAPS Take 5,000 Units by mouth daily in the afternoon.     fluvoxaMINE (LUVOX) 100 MG tablet Take 1 tablet (100 mg total) by mouth at bedtime. 90 tablet 0   lithium carbonate 300 MG capsule Take 3 capsules (900 mg total) by mouth at bedtime. 270 capsule 0   QUEtiapine (SEROQUEL XR) 300 MG 24 hr tablet Take 2 tablets (600 mg total) by mouth at bedtime. 180 tablet 0   No current facility-administered medications for this visit.    Medication Side Effects: sexual, weight  Allergies: No Known Allergies  Past Medical History:  Diagnosis Date   Anxiety    Bimalleolar fracture of left ankle    Depression    Heart murmur    Sleep apnea    uses CPAP nightly    Family History  Problem Relation Age of Onset   Cancer Maternal Grandfather    Colon cancer Neg Hx    Colon polyps Neg Hx    Esophageal cancer Neg Hx    Rectal cancer Neg Hx    Stomach cancer Neg Hx     Social History   Socioeconomic History   Marital status: Single    Spouse name: Not on file   Number of children: Not on file   Years of education: Not on file   Highest education level: Some college, no degree  Occupational History   Not on file  Tobacco Use   Smoking status: Every Day    Current packs/day: 1.50    Average packs/day: 1.5 packs/day for 48.2 years (72.3 ttl pk-yrs)    Types: Cigarettes    Start date:  03/22/1975   Smokeless tobacco: Never   Tobacco comments:    He reports he has quit 3 times and recently started back in 2012. Hsm   Vaping Use   Vaping status: Never Used  Substance and Sexual Activity   Alcohol use: Not Currently   Drug use: No   Sexual activity: Not on file  Other Topics Concern   Not on file  Social History Narrative   Not on file   Social Drivers of Health   Financial Resource Strain: Low Risk  (04/25/2023)   Overall Financial Resource Strain (CARDIA)    Difficulty of Paying Living Expenses: Not very hard  Recent Concern: Financial Resource Strain - Medium Risk (02/14/2023)   Overall Financial Resource Strain (CARDIA)    Difficulty of Paying Living Expenses: Somewhat hard  Food Insecurity: No Food Insecurity (04/25/2023)   Hunger Vital Sign    Worried About Running Out of Food in the Last Year: Never true    Ran Out of Food in the Last Year: Never true  Transportation Needs: No Transportation Needs (04/25/2023)   PRAPARE - Administrator, Civil Service (Medical): No    Lack of Transportation (Non-Medical): No  Physical Activity: Inactive (04/25/2023)   Exercise Vital Sign    Days of Exercise per Week: 0 days    Minutes of Exercise  per Session: 10 min  Stress: Stress Concern Present (04/25/2023)   [employer] of Occupational Health - Occupational Stress Questionnaire    Feeling of Stress : Very much  Social Connections: Moderately Isolated (04/25/2023)   Social Connection and Isolation Panel [NHANES]    Frequency of Communication with Friends and Family: Three times a week    Frequency of Social Gatherings with Friends and Family: Once a week    Attends Religious Services: Never    Database administrator or Organizations: Yes    Attends Engineer, structural: More than 4 times per year    Marital Status: Never married  Intimate Partner Violence: Not At Risk (02/15/2023)   Humiliation, Afraid, Rape, and Kick questionnaire    Fear of  Current or Ex-Partner: No    Emotionally Abused: No    Physically Abused: No    Sexually Abused: No    Past Medical History, Surgical history, Social history, and Family history were reviewed and updated as appropriate.   Please see review of systems for further details on the patient's review from today.   Objective:   Physical Exam:  There were no vitals taken for this visit.  Physical Exam Constitutional:      General: He is not in acute distress.    Appearance: Normal appearance. He is well-developed.     Comments: Red face  Musculoskeletal:        General: No deformity.  Neurological:     Mental Status: He is alert and oriented to person, place, and time.     Motor: No tremor.     Coordination: Coordination normal.     Gait: Gait normal.  Psychiatric:        Attention and Perception: Attention normal. He is attentive.        Mood and Affect: Mood is anxious and depressed. Affect is not labile, blunt, angry or inappropriate.        Speech: Speech normal.        Behavior: Behavior normal. Behavior is not agitated or slowed.        Thought Content: Thought content normal. Thought content is not delusional. Thought content does not include homicidal or suicidal ideation. Thought content does not include suicidal plan.        Cognition and Memory: Cognition normal.        Judgment: Judgment normal.     Comments: Insight  Fair. Continued mood problems without much mania but irritable and anxious.  Dep improved Seroquel helps anxiety a good bit.      Lab Review:     Component Value Date/Time   NA 142 05/26/2023 1113   K 4.3 05/26/2023 1113   CL 107 05/26/2023 1113   CO2 26 05/26/2023 1113   GLUCOSE 106 (H) 05/26/2023 1113   BUN 23 05/26/2023 1113   CREATININE 1.22 05/26/2023 1113   CREATININE 1.01 03/01/2022 0904   CALCIUM 9.5 05/26/2023 1113   PROT 7.0 05/26/2023 1113   ALBUMIN 4.4 05/26/2023 1113   AST 18 05/26/2023 1113   ALT 39 05/26/2023 1113   ALKPHOS 97  05/26/2023 1113   BILITOT 0.4 05/26/2023 1113       Component Value Date/Time   WBC 6.4 05/26/2023 1113   RBC 4.81 05/26/2023 1113   HGB 15.2 05/26/2023 1113   HGB 15.2 05/05/2010 1049   HCT 43.8 05/26/2023 1113   HCT 44.8 05/05/2010 1049   PLT 197.0 05/26/2023 1113   PLT 193 05/05/2010 1049  MCV 91.0 05/26/2023 1113   MCV 89.6 05/05/2010 1049   MCH 30.4 05/05/2010 1049   MCHC 34.7 05/26/2023 1113   RDW 13.5 05/26/2023 1113   RDW 13.1 05/05/2010 1049   LYMPHSABS 2.5 07/21/2022 1143   LYMPHSABS 1.8 05/05/2010 1049   MONOABS 0.8 07/21/2022 1143   MONOABS 0.4 05/05/2010 1049   EOSABS 0.6 07/21/2022 1143   EOSABS 0.2 05/05/2010 1049   BASOSABS 0.1 07/21/2022 1143   BASOSABS 0.0 05/05/2010 1049    Lithium Lvl  Date Value Ref Range Status  02/22/2023 0.7 0.6 - 1.2 mmol/L Final  02/22/23 lithium 0.7 on 900 mg daily. 04/01/22 lithium level 0.9 on 1200 mg daily  02/02/21 lithium level 0.6 on 900 mg daily  05/2023 vit D 30 started 5000 units helping fatigue  No results found for: "PHENYTOIN", "PHENOBARB", "VALPROATE", "CBMZ"   .res Assessment: Plan:     Jonathon Walker was seen today for follow-up, depression and anxiety.  Diagnoses and all orders for this visit:  Bipolar II disorder (HCC) -     QUEtiapine (SEROQUEL XR) 300 MG 24 hr tablet; Take 2 tablets (600 mg total) by mouth at bedtime. -     lithium carbonate 300 MG capsule; Take 3 capsules (900 mg total) by mouth at bedtime.  GAD (generalized anxiety disorder) -     fluvoxaMINE (LUVOX) 100 MG tablet; Take 1 tablet (100 mg total) by mouth at bedtime.  Mixed obsessional thoughts and acts -     fluvoxaMINE (LUVOX) 100 MG tablet; Take 1 tablet (100 mg total) by mouth at bedtime.  OSA (obstructive sleep apnea)  Lithium use  Low vitamin D level  Low serum vitamin B12     Chronic recurrent rumination with anxiety and urgency and sense of need and instablity with impatience ongoing with frequent phone calls between appts.  but  but anhedonic depression and anxious.  Needs a lot of reassurance. Chronic worry and overwhelmed easily and misattributes sx as SE of meds leading to inadequate med trials.  Fear of meds.Falsely attributes his psych sx to the meds and then stops meds AMA.  False attribution of sx as SE.  But overall this is less of a problem than it was. Discussed the use of a mood chart which may help address some of this part attribution and better delineate mood cycling.  Gave him a copy of the chart reviewed in detail with him as to how to keep it.  Anhedonic dep and anxiety.  Dep worse than anxiety.  Anxiety average.  Less restless.  Anxiety was better but not gone on fluvoxamine 150 mg .   he describe history other manic cycling symptoms including increased interest, increased goal-directed behaviors, increased urge to spend that will last for 6 weeks or so and then stop.  He has these cycles about 3 times a year.  This is suggestive of a bipolar predisposition which was confirmed by recent cycle into mania from clomipramine.  Continue CPAP use. New CPAP machine.  Discussed potential metabolic side effects associated with atypical antipsychotics, as well as potential risk for movement side effects. Advised pt to contact office if movement side effects occur.  He agrees.  Call if you have any problems with this. Best option not pursued.   Increase quetiapine ER  to  200 mg 2 tablets in evening for 3 weeks then 3 tablets in the evening.  He remains sober.    Bc late 2023 mania with clomipramine increased lithium 1200  mg daily .  He cut it back to 900 mg daily.  He admits to feeling worse when he stopped the lithium.  Call if sx get worse. Counseled patient regarding potential benefits, risks, and side effects of lithium to include potential risk of lithium affecting thyroid and renal function.  Discussed need for periodic lab monitoring to determine drug level and to assess for potential adverse  effects.  Counseled patient regarding signs and symptoms of lithium toxicity and advised that they notify office immediately or seek urgent medical attention if experiencing these signs and symptoms.  Patient advised to contact office with any questions or concerns. 02/02/21  02/02/21 lithium level 0.6 on 900 mg daily, normal B12 and vitamin D 52 04/01/22 lithium level 0.9 on 1200 mg daily  02/02/21 lithium level 0.6 on 900 mg daily 02/22/23 lithium 0.7 on 900 mg daily.  Disc the off-label use of N-Acetylcysteine at 600 mg daily to help with mild cognitive problems.  It can be combined with a B-complex vitamin as the B-12 and folate have been shown to sometimes enhance the effect.  Partial benefit so continue NAC 1200 mg daily.  Continue vitamin D DT history level 30  Constipation management 1.  Lots of water 2.  Powdered fiber supplement such as MiraLAX, Citrucel, etc. preferably with a meal 3.  2 stool softeners a day 4.  Milk of magnesia or magnesium tablets if needed  Disc tx of recent URI  Try to stay as active as possible.  Please see After Visit Summary for patient specific instructions.  FU 6 weeks  Meredith Staggers, MD, DFAPA     Future Appointments  Date Time Provider Department Center  07/27/2023 11:00 AM Cottle, Steva Ready., MD [facility] None  02/19/2024 10:40 AM [facility]-ANNUAL WELLNESS VISIT [facility]    No orders of the defined types were placed in this encounter.       -------------------------------

## 2023-07-27 ENCOUNTER — Ambulatory Visit: Payer: Medicare Other | Admitting: Psychiatry

## 2023-07-27 ENCOUNTER — Encounter: Payer: Self-pay | Admitting: Psychiatry

## 2023-07-27 DIAGNOSIS — G4733 Obstructive sleep apnea (adult) (pediatric): Secondary | ICD-10-CM | POA: Diagnosis not present

## 2023-07-27 DIAGNOSIS — F422 Mixed obsessional thoughts and acts: Secondary | ICD-10-CM

## 2023-07-27 DIAGNOSIS — F3181 Bipolar II disorder: Secondary | ICD-10-CM | POA: Diagnosis not present

## 2023-07-27 DIAGNOSIS — E538 Deficiency of other specified B group vitamins: Secondary | ICD-10-CM

## 2023-07-27 DIAGNOSIS — F1021 Alcohol dependence, in remission: Secondary | ICD-10-CM

## 2023-07-27 DIAGNOSIS — F411 Generalized anxiety disorder: Secondary | ICD-10-CM | POA: Diagnosis not present

## 2023-07-27 DIAGNOSIS — R7989 Other specified abnormal findings of blood chemistry: Secondary | ICD-10-CM

## 2023-07-27 DIAGNOSIS — Z79899 Other long term (current) drug therapy: Secondary | ICD-10-CM

## 2023-07-27 MED ORDER — PAROXETINE HCL 30 MG PO TABS: 30.0000 mg | ORAL_TABLET | Freq: Every day | ORAL | 0 refills | Status: DC

## 2023-07-27 NOTE — Patient Instructions (Addendum)
 Reduce fluvoxamine  to 1/2 tablet and add 1/2 paroxetine  for 5 days, Then stop fluvoxamine  and increase paroxetine  to 1 tablet daily.

## 2023-07-27 NOTE — Progress Notes (Signed)
 Jonathon Walker [id number] 1965/11/27 58 y.o.  Subjective:   Patient ID:  Jonathon Walker is a 58 y.o. (DOB October 09, 1965) male.  Chief Complaint:  Chief Complaint  Patient presents with   Follow-up   Depression   Anxiety   Fatigue    Jonathon Walker presents to the office today for follow-up of anxiety , depression, alcohol.    seen August 7.  His anxiety was unmanaged at Paxil  40 mg and olanzapine  5 mg so olanzapine  was increased to 7.5 daily.  seen December 17, 2018.  His anxiety was unmanaged.  The following change was made: Trial 1-1/2 of the 7.5 mg tablets of olanzapine  for anxi.  He is had a stay at [facility] for alcohol dependence since he was last here. Stayed 28 days and DC before Thanksgiving.  Very helpful.  Was having NM nighly before and they stopped since then.  Not drinking.  Involved in AA since then.  seen March 25, 2019.  The patient was doing better requested a reduction in olanzapine  to 7.5 mg nightly to improve energy.  visit April 03, 2019.  He had remained sober and his anxiety was improved with sobriety.  He was having problems with low motivation and forgetfulness.  At the next refill it was decided olanzapine  would be reduced from 7.5 to 5 mg daily. He called requesting this urgent appointment because of worsening symptoms associated with returning to work.  seen May 21, 2019.  He was doing relatively well.  The following was noted: Tolerating Wellbutrin  and it seems to help. He didn't tolerate the increase. Anxiety is improved off alcohol.  Ok to reduce olanzapine  to 2.5 mg daily for a month and stop it.  Hopefully low motivation will improve.   He called back March 23 stating he was more depressed and having a harder time doing routine tasks and having suicidal thoughts.  Because of worsening depression after reducing the olanzapine  he was encouraged to increase olanzapine  back to 5 mg nightly. He called again March 24 stating he was really  struggling but was scheduled for an appointment on March 26.  As of June 14, 2019 he reports the following: Every day a real hard struggle to get through the days.  More anxious and depressed equally. Worse 2 weeks.  Last Saturday so overwhelmed by how he feels he had SI.  Everything is hard. Initial benefit paroxetine  has been lost.  Awakens stressed and tired. Adequate hours of sleep.  Will be major chore just to mow grass.  Still sober. SE sexual. Getting appt with CPAP doc. Changes made include: Increasing olanzapine  back to 5 mg daily a couple of days prior to the appointment due to phone call and the addition of lithium  300 mg for a few days then increase to 600 mg nightly both to augment the antidepressant and because of suicidal thoughts.  July 15, 2019 appointment, the following is noted: Doing OK with both depression and anxiety.  Kind of like a top always going in mind.  Even when I sleep, when awakens mind still going.  NM nightly for years but less than before stopped drinking.  Smaller and less severe ones that don't wake him.  Theme can't finish things.  Both depression and anxiety 3/10.  More productive.  Paces himself.  Can live with it.  Sleep 9 hours.  Initially olanzapine  stopped the ruminating but it's back some.  Overall still benefit.  No SI and can't rmember why he had  them before. Residual energy not great and some ruminating. He had some concerns about sexual side effects from Paxil  but agreed that no med changes were necessary despite residual symptoms of depression and anxiety.  08/14/2019 appointment, the following is noted:  He scheduled urgently. Vaccinated. Angry easily and exhausted and everything is a chore.  Exhausted. Never had appt with CPAP company.  CPAP is 59 years old and on same settings as in the past.  CPAP machine is not working properly but when it did it was helpful for alertness and energy.  Disc need to call them to have it evaluated. To bed 10-7.   Don't feel rested in morning but used to feel rested when first started paroxetine . Plan: Increase Lithium  continue to 900 mg daily for irritability  11/19/19 appt with the following noted: He increased to 900 mg and then reduced it but doesn't know why.  Reduced it to 300 nightly. Irritability much better.  Once anger since here with rage but not outward. Still on paroxetine  40, olanzapine  5, Wellbutrin  75 AM. Getting tired in afternoon and worrying about getting depressed but he's not depressed now.  Don't enjoy things like he used to.  Les interest and excitement. Appetite and sleep are normal.   Still adjusting without alcohol and socialization. Worrying about lack of sex drive with paroxetine . Can still ruminate on things until he gets some reassurance through talking with people. Plan: Continue low-dose lithium  300 mg daily Continue Paxil .  Did not feel well at 60 mg so we will continue 40 mg.  Likely to lose anxiety benefit if change it. Continue low-dose Wellbutrin  75  Tolerating Wellbutrin  and it seems to help. He didn't tolerate the increase.   Continue olanzapine  5 mg  02/18/2020 appointment with following noted: Open to trying increase paroxetine  again bc ruminating wears him out and gets fatigued and it wears him out though is 80% better vs before paroxetine . Doesn't remember trying higher paroxetine .  Ruminates on relationships or work.  Whenever he can talk about it with someone it helps tremendously but also realizes paroxetine  helps. Anxiety affects dreams and NM too.  Up and down a little.  Anxiety drove depression before paroxetine .  Best med he's tried. SOBER 1 YEAR SE no problem.  Except gained 25# in 4 years.  No change in diet and exercise.  No increase in appetite and no food cravings.  Seeing PCP soon. Plan: Increase paroxetine  trial to 60 for rumination.  Disc SE.  05/26/20 appt noted: No benefit increase paroxetine  nor SE.  Wants to reduce back to 30 mg paroxetine   and stop lithium  bc it didn't seem to help.   4 days of Calm app has seemed to help rumination.  Does it in afternoon and before bed and it seemed to helps.  Doing a 10 min meditation and it helps.   Dep 4/10.  Anxiety 4/10. And a lot better the last few days.   Plan: Reduce lithium  by 1 tablet per week.  Call if there is any suicidal thought. Starting April 1 reduce paroxetine  to 40 mg daily. Continue low-dose Wellbutrin  75  Tolerating Wellbutrin  and it seems to help. He didn't tolerate the increase.   If doing OK will try taper as he suggested at next visit. Continue olanzapine  5 mg nightly it was helpful for depression and anxiety.  07/01/2020 appt with following noted: Moved appt up. Now says he understated depression level when he was here the last time. Restarted lithium  bc felt  more depressed without it. Reduced paroxetine  to 40 mg daily. Lethargic.  Mind won't calm down.  Nonrestorative sleep but 8 hours.  Doesn't think sleep is deep enough.  Tense. Goes to [employer]. Started counseling. Wants to try something different with meds. Can have mixed sx for 6 weeks with increased interest in things and motivation and want to spend money but still feels depressed.  3 times in the last year. Plan: Increase  lithium  back to 3 daily to see if depression is better and less mood cycling.  He admits to feeling worse when he stopped the lithium  Continue the reduce paroxetine  to 45 mg daily. DC Wellbutrin  Lamotrigine  trial 25 mg to 100 mg daily over 4 weeks. Continue olanzapine  5 mg nightly it was helpful for depression and anxiety.  08/26/2020 appointment with the following noted: Sad and down.  Classic depression sx and very fatigued.  Unusual to have classic depression bc usually is atypical.  No effect from lamotrigine . Wonders if atorvastatin  is causing some confusion or depression bc read about it. Depressed for 3 mos. Gained 20#.  It's leveled off now. Olanzapine  helped the  rumination and anxiety.   Life is more depressing bc not seeing friends like it was bc stopped drinking.   No SE with lithium .  Unless dropping things. Sleep 1030 to 6 but doesn't sleep deep.  Not rested in AM Plan: Increase  lithium  back to 3 daily to see if depression is better and less mood cycling.  He admits to feeling worse when he stopped the lithium  Continue the reduce paroxetine  to 40 mg daily bc it helped anxiety. Lamotrigine  increase to 150 mg daily DT no benefit or SE Increase olanzapine10 mg nightly it was helpful for depression and anxiety.  Need more help with depression   09/11/2020 phone call: Pt stated he is foegetting things throughout the day and very unsteady.He works with tools and equptment and this is a problem.He stated he takes 6 Lamictal  in the am. MD response: We made 3 med changes at the last visit including increasing lamotrigine  to 150 mg daily, increasing olanzapine  to 10 mg daily, and restarting lithium .  Any 1 of those changes could possibly cause some of the side effects.  We will have to make 1 change at a time to evaluate this.  Therefore reduce lamotrigine  to 100 mg daily.  It may take a couple of weeks before he sees a difference.  11/13/2020 phone call: Patient lm stating he is currently taking Lamotrigine  150 mg. He has decreased it to 75 mg due to side effects per pt ( blurred vision, shaking and coordination issues). Patient stated he is discontinuing before his scheduled October appointment. MD response: Take the lamotrigine  75 mg for 2 weeks and he should be able to stop then without withdrawal.  If he gets shakey, nervous then we need to reduce more slowly and let us  know that.  Otherwise he can stop if he follows these directions.  11/30/2020 phone call: Pt called and advised he would like to taper off of the Olanzapine .  He has gained 8# since his last visit and 30# total since he started taking the med.  He feels steady, not so much stress now, and would  like to see if he can take Lorazepam instead.  Then talk to Dr. Toi Foster in Oct when they meet. MD response: Reduce olanzapine  to one half of the 10 mg tablet at night for 4 weeks and then stop it.  If he gets more depressed then get back in touch with us  and we will try to do something about the weight gain that olanzapine  can cause by using the alternative Lybalvi  12/07/2020 appointment with the following noted: Off lamotrigine  .  Reduced olanzapine  to 5 mg for a week.  On lithium  900 mg HS, paroxetine  40 Fatigue 10/10.  Dizziness resolved off lamotrigine .  Low fatigue and motivation.  "I want to want to do things".  Stopped drinking almost 18 mos and socially big adjustment.   Still invloved with AA and daily sponsor.  Working [employer].  [address] [employer] helped. Thinks mild depression with little interest and motivation and no joy.  But not severe.  No SI. No anxiety and it's way better.  Life is more stable and helps.  Working 4 hours daily and occ extrea jobs. Uses new CPAP.  Stil drowsy and fatigue. Plan: Continue CPAP use. New CPAP machine. Trial modafinil  100-200 mg AM Lamotrigine  off DT NR Reduced olanzapine  to 5 mg daily for a week and well stop in 3 weeks DT wt gain and fatigue  01/20/2021 phone call: I called patient to see if I could get better information. He states he started modafinil  a couple of weeks ago, though Rx was written for in September. He states he didn't feel well before taking the modafinil  though. He just says he is agitated, irritable, and unstable feeling. When asked what he meant by unstable he stated feeling a "shell of myself".  He has been put on the cancellation list.  MD response: It is likely the modafinil  that is causing this problem.  But it may be corrected with a dose reduction.  I assume he is taking a whole tablet.  If that is the case time to reduce to 1/2 tablet in the morning.  If he is taking half a tablet tell him to reduce it to a quarter of a tablet.  And I  agree we need to try to get him in as soon as possible. He reduced modafinil  from 200 mg to 100 mg daily.  02/10/2021 appointment with the following noted: Current psych meds lithium  900 mg nightly, paroxetine  40 mg daily, olanzapine  recently stopped, modafinil  recently tried. Was really bad for awhile feeling depresssed and agitated and anxiety but better now including after reducing modafinil  to 100 mg daily. Fatigue is better and in the middle now with energy, not good but not bad. Sleep is better.  CPAP doctor yesterday and they are gathering info pending. NAC is helping with memory. Still sober and committed to it. SE constipation  Plan: No med changes  03/10/2021 phone call from patient complaining of vague feelings of not feeling well and wanting the Paxil  changed.  He was placed on the cancellation list.  03/18/2021 appointment urgently scheduled at his request: Remains on paroxetine  40 mg daily, lithium  1200 mg daily, modafinil  200 mg every morning. "Ruminating"  with sense of need to talk to someone.  Then feels better for awhile.  Mostly on relationships with family.  No one to talk to. Wonders need to chang ethe meds.   Don't socialize anymore since sober.  Does online AA everyday at noon. Lost 10# off olanzapine . Plan: Continue paroxetine  40 mg, lithium  1200 mg daily, Try stopping modafinil  100mg  AM to determine if it is helping or not. If not less anxious then start risperidone  for rumination 1-2 mg HS  04/05/2021 appointment with the following noted: Stopped  modafinil  and it helped  reduce anxiety. Started risperidone  2 instead of 1 mg and was off balance so reduced risperidone  to 1 mg HS. Only done this for 3 nights.  No SE with it. Anxiety is not close to as bad as originally.  Can be painful but not now. Still ruminating some "my whole adult life" but varies.  Anxiety can lead to depression. 6-7/10 with 10 good on depression with some problems with ambition. Plan:  Continue trial risperidone  for rumination 1 mg HS  04/07/2021 TC: Several phone calls: Called 2 days after the visit complaining of itching from the lithium  and wanting to come off of it.  He was warned he was more emotionally distressed when he reduced the lithium  in the past but he wanted to pursue it anyway.  He was continuing risperidone  1 mg nightly but wanting to reduce that also but was warned he would almost certainly have worsening mood severity and anxiety problems based on past experience.  He was cautioned against reducing lithium  below 900 mg daily and risperidone  below 1 mg nightly.  He agreed on 04/09/2021.  04/09/2021 he called again and several phone calls ensued thereafter.  He insisted on stopping lithium  and risperidone  despite warnings that his mood stability and anxiety would worsen. He was prescribed Depakote  to take the place  04/20/2021 appointment with the following noted: He stopped risperidone  and did not take the Depakote .  He is taking lithium  900 mg nightly and paroxetine  40 mg daily. Stopped risperidone  bc feeling agitated and unstable and blamed it.  Still feels the same off of it. Says itching 85% better with less lithium .  Itching for a couple of mos.  Doesn't think anxiety noticeably worse after reducing lithium  to 900 mg daily. Not dizzy.  Clumsy with hands. Anxious but not painful like it was before Paxil  but tense at the end of the day.  Initially felt normal for the first time in 20 years when first started paxil  but not as good now. Not sadness and hasn't had much of it. Started therapy with doctor on demand. Ruminating on "fear of not being fully present" and worries about little things like appts pending.   Afraid of working FT because gets overwhelmed bc "I take it home with me" Plan : Continue trial risperidone  but increase to rumination 1.5 mg HS Continue paroxetine  40 and lithium  600 mg   06/01/2021 appointment with the following noted: Haven't seen  benefit with risperidone  1.5 mg daily. Not in pain but feels disconnected and not myself.  Has felt like this off and on for years. Level of angst all day but not real high unless triggered.  Still has the rumination. Initially a lot of benefit from paroxetine  Not markedly deprssed and not sad.  07/14/21 appt noted:  Doesn't feel much different with switch to fluvoxamine  except anxiety well controlled. But still low energy, motivation and not as productive as he wants to be.  Not markedly sad.  Asks about Vraylar . Not much interest or enjoyment. Don't sleep that great and don't relax that great. On fluvoxamine  200, lithium  900, off risperidone . Plan: Anxiety controlled by fluvoxamine  200 Potentiate Vraylar  1.5 mg daily  07/27/2021 phone call complaining of ongoing depression.  Vraylar  increased to 3 mg daily. 07/29/2021 phone call complaining of cost of Vraylar .  Was given samples. 08/20/2021 phone call complaining of fatigue.  Stated he stopped the Vraylar  because it made him agitated.  It was recommended there be no further med changes because he is only been  off the Vraylar  for 1 week.  08/30/2021 appointment noted: Vraylar  1.5 mg didn't do much.  Increased to 3 mg and then felt irritable ans stopped it about 3rd week of May.  Better now. Still extremely tired and feels physically sick with normal workup. Thinks his mind wears him out.  Racing negative thoughts all the time. Asks about Ativan. Always nervous and anxious.  Trouble discerning anxiety from depression. Plan: Anxiety was controlled by fluvoxamine  200 but now he thinks it is worse. So increase 250 mg daily  10/18/21 appt noted: No results with increase fluvoxamine .  No SE. Anxiety is still worse than depression.  Thinks it causes somatic sx like the flu.  Exhausted. GI. Poor sleep and concentration. Asks about lorazepam.   Got checked out by PCP without findings. Plan: Reduce lithium  to 2 capsules daily to see if physical  symptoms like fatigue etc. are better Increase fluvoxamine  to 1 tablet in the morning and 2 tablets in the evening to reduce anxiety Plan Trial reduction of  lithium  900  to 600 mg daily to see if somatic sx are better like fatigue.   01/06/22 appt noted" Everything is about the same with med changes. CC anxiety and fatigue.  Sx started with severe depression in 20s and got better but never resolved. Thinks anxiety is causing fatigue.  Has to nap about 1 pm.  Easily fatigued.  No particular worry other than how he feels and himself.. Plan: Reduce fluvoxamine  gradually to 150 mg daily Start clomipramine  25 mg nightly for 5 days and increase to 75 mg HS  02/07/22 TC:  Pt lvm that the clomiprame 25 mg has been taking the full dose for two weeks now. He is experiencing mania. Overspending etc    MD resp:  Increase the lithium  back to 3 daily.  Stop the clomipramine .  Resume the risperidone  until these symptoms clear up.  Call us  back next week and let us  know how it is going.     02/16/22 TC:   Pt was just calling to give you an update.He stated he is still 50 percent manic but feels better.He is currently taking   lithium  carbonate 300 MG 3 daily risperiDONE  3 mg 1/2 tab daily fluvoxamine  100 MG tablet 1/2 tablet in the AM and 1 tablet at night On 11/20 changes were made,you had him stop clomipramine                      and increase lithium  to 900 mg daily.  02/16/22 MD: check lithium  level.  02/24/22 tC complaining of depression. MD :  He has had recent mood swings from manic to depressed by his report.  Lithium  level is low at 0.5 on 600 mg daily.  I think the most helpful thing for his mood would be to increase lithium  back to 900 mg daily.      03/07/22 TC: pt reported increasing his Luvox  to 200 mg daily.  03/08/22 appt noted: Multiple phone calls since here.   Clomipramine  triggered mania like he's never had before.  Increased over the top looking for a house, wanting to invent  things, bought a shed and fish tank abruptly.  Left his job which was a good thing.  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  I feel a lot better about it.  Has disability and will do PT work.  I'll be OK. Exhausted and can't be on his feet all the time.  Sleep is different with EMA and ready to go but watches TV awhile and then goes back to sleep.  Always a late sleeper all his life.   Still feeling some depression and he increased the luvox  to 200 and it helped. Thinks parents are Autistic and don't respond emotionally.  M cannot understand feelings and he doesn't get any support.   Plan: Bc recent mania with clomipramine  increase lithium  1200  mg daily .    03/29/2022 appointment urgently due to recent mania: Psych meds: fluvoxamine  200, lithium  1200, risperidone  1.5 mg HS Exhausted until the last couple of days. SE tremor Has not had any water today and is lightheaded.   Mood a little better and anxiety better than it was.  No mania. Some underlying weight in mood that only responded to paroxetine  and clomipramine . Asks about Wellbutrin  for energy. Sleep normal 8-9 hours now.  Except last night. Goes to [facility] groups and support group and AA. Sober Reduce risperidone  to 1 mg to see if energy better  05/25/22 appt noted: Reduced lithium  about 10 days ago bc thought he might be getting too much bc read on the internet.  No changes since reducing it. More dep for a couple of mos which is unusual.  In bed a lot and more isolated and not normally what I have.  Not real sad but unmotivated and no energy to do things.    Start therapist Friday. No sig sadness.  Irritable and short with people over little things.  Kind of started after stopped drinking.  Worse.  Sleep on and off.  Erratic.  Looks for ward to night so can escape into sleep.   Anxious underlying for 30 years.  Hard to sit and relax and just watch TV.  Tends to ruminate. Asks to try Gaba. Now lethargy is worse than chronic  anxiety Reducing risperidone  made no changes good or bad. DC risperidone  1 mg HS Add Latuda  1/2 tablet for 1 week then 1 tablet in the the evening  07/06/22 appt :  Increased Latuda  up to 100 mg .  No clear SE. Exhausted mentally and it makes his body tired.  Working 10-2 and then comes home and lays in bed a couple of hours.  Not a deep sleep.  Never feels fully rested.  Sleep HS 10-7.   Normal appetitie.   Still some sadness and irritability.  Anxiety doesn't seem better but is having less mood swings and mania.  Anxiety wears him down.   Plan: Increase  Latuda  120 mg in the evening with food  07/21/22 appt noted: Trouble keeping mood chart.  If I talk and I'm heard then feels better for hours but then damn stopped up.  Almost like an obsessive thing to talk for relief. No clear effect of increase Latuda  except reduced som cognitive anxiety but not physical anxiety. Says there was med he took before paxil  that gave him instant relief and wonders about retrying it.  Doesn't know which med. Paxil  had best effect when it worked for a period of time. Exhausted but fidget when sits.   Dep and anxious moderate.  09/01/22 appt noted: Meds clonazepam  0.5 mg BID prn, fluvoxamine  200 mg daily, lithium  900 HS, latuda  120 mg daily, vit D3 This type of dep since November after mania is low energy, anhedonia, low appetite, a lot of time in bed, "textbook" depresssion, lack of interest but not debilitating sadness.   Tired of it.  Dep is worse than anxiety  and used to be the other way around.  Not as irritable. In past dep was anxious and disconnected and wasn't as tired.  Had to leave work bc can't get himself out of bed.   Toss and turn in bed fidgety in bed, feels like has to move but no energy to walk.  Fidgety for 3 mos. Clonazepam  takes the edge off but feels a higher dose is needed.  Plan: Changes: Reduce Latuda  to 60 mg daily. Start samples of Auvelity  1 in the AM for dep that is TRD  10/20/22 appt  noted: Meds clonazepam  0.5 mg BID prn, fluvoxamine  200 mg daily, lithium  900 HS, latuda  60 mg daily, vit D3, out of Auvelity  after 2 weeks. Less restless but still has it 6/10 after reduced Latuda .  Thinks he had it before eLatuda but not sure. Takes an hour to get to sleep.   No change in dep from this visit to last visit. Did have positive effects from Auvelity  but ? SE conc.  Did help energy Lately more dep than anxiety.  Noted ASA helps anxiety. Couple times situational anger but no mood swings. Plan: Changes: Reduce fluvoxamine  to 1 and 1/2 tablet daily to see if restlessness better Reduce Latuda  from 60 to 40 mg daily to see if restlessness better Retry Auvelity  1 in the AM for 7 days then 1 twice daily  11/14/22 appt noted: Twitching and restlessness better.   Still some teeth grinding but better Auvelity  didn't help mood.  Ongoing classic lethargy, poor appetite.  Can't exercise bc depression.  Little tasks overwhelming.    clonazepam  0.5 mg BID prn, fluvoxamine  150 mg daily, lithium  900 HS, latuda  40 mg daily, vit D3,  Auvelity  BID SE constipation and bruxism. Sleep fair and best and most relaxed 730-930 AM.  To bed 10 pm.   12/26/22 appt noted: More relaxed if not underpressure but not working and needs to. Not much motivation or energy in AM.  Doesn't notice the effects of Seroquel  for many hours, but it is relaxing med. For last 7 years hard time with motivation and capacity.   Happened years ago and then it resolved after 7 years with Wellbutrin  Plan: Changes none:  Continue 1/2 quetiapine  XR 150 mg PM.  This is helping him to feel more calm in the morning than he has felt in a very long time.  However he thinks now he is a little too subdued but does not want to do reduce the dose any further.  Does not tolerate a higher dose because it is too sedating.  01/09/23 TC:  Patient taking 75 mg of Seroquel  and sleeping great at night, but also sleeping a lot during the day. He  reports he doesn't want to be around people, is content to be at home not doing anything. He rates depression as 7/10, anxiety 5/10. Reports anxiety is better since starting Seroquel . He also takes: Fluvoxamine  150 mg at bedtime Lithium  900 mg at bedtime, though 1200 is prescribed. No longer taking clonazepam .    MD resp:  There are a limited number of options left to us  and it is important that each med be tried at his most effective dose.  Seroquel  is FDA approved for depression in doses of 150 mg and higher.  The low dose he is currently on is enough to help anxiety and his sleep but probably not enough to help depression.  We may need to go as high as 200 or 300 mg a day.  He is probably fearful of being too sedated at higher doses but this medicine does not necessarily make people more sleepy at higher doses.  I think she got to go up to the full quetiapine  XR 150 mg daily and give that a try until his appointment with me.      02/08/23 appt noted: Psych med: no Clonazepam  0.5 mg twice daily as needed anxiety, fluvoxamine  150 nightly, lithium  900 nightly, quetiapine  XR 150 at 3 pm Less am drowsiness taking Seroquel  in the afternoon. Chronic fatigue unchanged.  Rare days without it.  Can do one task daily.  Can work 1 and 1/2 hours daily.  Naps about 2 pm.   Seroquel  helps sleep and anxiety.   Easily overwhelmed if multiple tasks.   Feels much better in the AM than in years.  By noon is worn out.   Plan: Increase for TRD quetiapine  XR 200 mg PM.   03/23/23 appt noted: Meds as above except incr Seroquel  XR 200 mg daily. Anxiety better but still some dep with less motivation maybe 20% better. No sig SE except dryness. URI for 8 days. Sleep 10 hours per usual.   Much more relaxed with Seroquel . Plan: Increase further for TRD quetiapine  XR 300 mg PM.   05/02/23 appt noted: Multiple phone calls since here complaining of ongoing sx and med rxns. Incr and decrease fluvoxamine  to 150 now 100  complaining of no benefit for anxiety at the higher dose and feeling somewhat agitated.   Current meds:.  Fluvoxamine  100 nightly, lithium  900 nightly, quetiapine  ER 300 mg nightly. Felt annoyed, angry , agitated without reason and called.   Is a little better the last 10 days.  Not nearly as angry SE wt gain and constipation. Asks about switch to paxil  bc helped the most of anything.   Speaking to counselor.  Fearful childhood. Plan: Increase quetiapine  ER  to  200 mg 2 tablets in evening for 3 weeks then 3 tablets in the evening.   06/15/23 appt noted: Med: Seroquel  XR 600, lithium  900, fluvoxamine  150. Vit D 5000 helped Overall is a little better.  Less foggy.  Less dep. 30-40% better with combo. SE gained another 5# Vit D helped fatigue.  Still naps but less. Sleep is OK. Less angry but still has shortness.  But not as bad. Plan continue above longer  07/27/23 appt noted: Med as above 50-60% of himself.  Mind wears him out and then has to take a nap.  It's been a lot worse than this before but would like change meds to get better back to paroxetine .   No changes with increase Seroquel . Willing to come back to this if sx are not better with paroxetine .   Physical weakness is rough.  But doesn't work so doesn't have to do much .  Last year could work 4 hours daily but not now.  Get overwhelmed really quickly with little matters.  Gets irritable.  On disability for 7-8 years.  Working PT [employer] for alcohol.  Sober 2 year 01/2019  Past psych meds:   Paxil  60 "too much" + wt gain,   duloxetine, Zoloft,  Viibryd 60 partial resp (trial after paroxetine ) SE diarrhea. Trintellix 10 agitated Wellbutrin  75 (max tolerated),   Fluvoxamine  300 no better than 200 which seems to take edge off anxiety Clomipramine  mania Auvelity  4 weeks NR  Abilify, Rexulti 2. Vraylar  irritable at 3 mg daily.  Latuda  120 fidgety Olanzapine  10 fatigue Risperdal  2 once  Seroquel  XR 600 benefit  partial  Buspar 30 BID stopped DT partial effect.  Xanax craving,   lithium   900, worse when he stopped lithium  Lamotrigine  NR CO dizzy  NAC 600 helped Modafinil  200 agitated Took lorazepam  in the past, Xanax less helpful  B severe OCD obsessions psych tx.  Review of Systems:  Review of Systems  Constitutional:  Positive for fatigue. Negative for unexpected weight change.  Cardiovascular:  Negative for palpitations.  Gastrointestinal:  Positive for constipation. Negative for nausea.  Neurological:  Negative for tremors.  Psychiatric/Behavioral:  Positive for dysphoric mood. Negative for agitation, behavioral problems, confusion, decreased concentration, hallucinations, self-injury, sleep disturbance and suicidal ideas. The patient is nervous/anxious. The patient is not hyperactive.     Medications: I have reviewed the patient's current medications.  Current Outpatient Medications  Medication Sig Dispense Refill   amLODipine  (NORVASC ) 5 MG tablet Take 1 tablet (5 mg total) by mouth daily. 90 tablet 3   atorvastatin  (LIPITOR) 40 MG tablet Take 1 tablet (40 mg total) by mouth daily. 90 tablet 3   Cholecalciferol (VITAMIN D3) 125 MCG (5000 UT) CAPS Take 5,000 Units by mouth daily in the afternoon.     lithium  carbonate 300 MG capsule Take 3 capsules (900 mg total) by mouth at bedtime. 270 capsule 0   PARoxetine  (PAXIL ) 30 MG tablet Take 1 tablet (30 mg total) by mouth daily. 90 tablet 0   QUEtiapine  (SEROQUEL  XR) 300 MG 24 hr tablet Take 2 tablets (600 mg total) by mouth at bedtime. 180 tablet 0   No current facility-administered medications for this visit.    Medication Side Effects: sexual, weight  Allergies: No Known Allergies  Past Medical History:  Diagnosis Date   Anxiety    Bimalleolar fracture of left ankle    Depression    Heart murmur    Sleep apnea    uses CPAP nightly    Family History  Problem Relation Age of Onset   Cancer Maternal Grandfather    Colon  cancer Neg Hx    Colon polyps Neg Hx    Esophageal cancer Neg Hx    Rectal cancer Neg Hx    Stomach cancer Neg Hx     Social History   Socioeconomic History   Marital status: Single    Spouse name: Not on file   Number of children: Not on file   Years of education: Not on file   Highest education level: Some college, no degree  Occupational History   Not on file  Tobacco Use   Smoking status: Every Day    Current packs/day: 1.50    Average packs/day: 1.5 packs/day for 48.3 years (72.5 ttl pk-yrs)    Types: Cigarettes    Start date: 03/22/1975   Smokeless tobacco: Never   Tobacco comments:    He reports he has quit 3 times and recently started back in 2012. Hsm   Vaping Use   Vaping status: Never Used  Substance and Sexual Activity   Alcohol use: Not Currently   Drug use: No   Sexual activity: Not on file  Other Topics Concern   Not on file  Social History Narrative   Not on file   Social Drivers of Health   Financial Resource Strain: Low Risk  (04/25/2023)   Overall Financial Resource Strain (CARDIA)    Difficulty of Paying Living Expenses: Not very hard  Recent Concern: Financial Resource Strain - Medium Risk (02/14/2023)   Overall Financial Resource Strain (  CARDIA)    Difficulty of Paying Living Expenses: Somewhat hard  Food Insecurity: No Food Insecurity (04/25/2023)   Hunger Vital Sign    Worried About Running Out of Food in the Last Year: Never true    Ran Out of Food in the Last Year: Never true  Transportation Needs: No Transportation Needs (04/25/2023)   PRAPARE - Administrator, Civil Service (Medical): No    Lack of Transportation (Non-Medical): No  Physical Activity: Inactive (04/25/2023)   Exercise Vital Sign    Days of Exercise per Week: 0 days    Minutes of Exercise per Session: 10 min  Stress: Stress Concern Present (04/25/2023)   [employer] of Occupational Health - Occupational Stress Questionnaire    Feeling of Stress : Very much   Social Connections: Moderately Isolated (04/25/2023)   Social Connection and Isolation Panel [NHANES]    Frequency of Communication with Friends and Family: Three times a week    Frequency of Social Gatherings with Friends and Family: Once a week    Attends Religious Services: Never    Database administrator or Organizations: Yes    Attends Engineer, structural: More than 4 times per year    Marital Status: Never married  Intimate Partner Violence: Not At Risk (02/15/2023)   Humiliation, Afraid, Rape, and Kick questionnaire    Fear of Current or Ex-Partner: No    Emotionally Abused: No    Physically Abused: No    Sexually Abused: No    Past Medical History, Surgical history, Social history, and Family history were reviewed and updated as appropriate.   Please see review of systems for further details on the patient's review from today.   Objective:   Physical Exam:  There were no vitals taken for this visit.  Physical Exam Constitutional:      General: He is not in acute distress.    Appearance: Normal appearance. He is well-developed.     Comments: Red face  Musculoskeletal:        General: No deformity.  Neurological:     Mental Status: He is alert and oriented to person, place, and time.     Motor: No tremor.     Coordination: Coordination normal.     Gait: Gait normal.  Psychiatric:        Attention and Perception: Attention normal. He is attentive.        Mood and Affect: Mood is anxious and depressed. Affect is not labile, blunt, angry or inappropriate.        Speech: Speech normal.        Behavior: Behavior normal. Behavior is not agitated or slowed.        Thought Content: Thought content normal. Thought content is not delusional. Thought content does not include homicidal or suicidal ideation. Thought content does not include suicidal plan.        Cognition and Memory: Cognition normal.        Judgment: Judgment normal.     Comments: Insight   Fair. Continued mood problems without much mania but irritable and anxious.  Dep improved Seroquel  helps anxiety a good bit. But not gone      Lab Review:     Component Value Date/Time   NA 142 05/26/2023 1113   K 4.3 05/26/2023 1113   CL 107 05/26/2023 1113   CO2 26 05/26/2023 1113   GLUCOSE 106 (H) 05/26/2023 1113   BUN 23 05/26/2023 1113   CREATININE 1.22  05/26/2023 1113   CREATININE 1.01 03/01/2022 0904   CALCIUM  9.5 05/26/2023 1113   PROT 7.0 05/26/2023 1113   ALBUMIN 4.4 05/26/2023 1113   AST 18 05/26/2023 1113   ALT 39 05/26/2023 1113   ALKPHOS 97 05/26/2023 1113   BILITOT 0.4 05/26/2023 1113       Component Value Date/Time   WBC 6.4 05/26/2023 1113   RBC 4.81 05/26/2023 1113   HGB 15.2 05/26/2023 1113   HGB 15.2 05/05/2010 1049   HCT 43.8 05/26/2023 1113   HCT 44.8 05/05/2010 1049   PLT 197.0 05/26/2023 1113   PLT 193 05/05/2010 1049   MCV 91.0 05/26/2023 1113   MCV 89.6 05/05/2010 1049   MCH 30.4 05/05/2010 1049   MCHC 34.7 05/26/2023 1113   RDW 13.5 05/26/2023 1113   RDW 13.1 05/05/2010 1049   LYMPHSABS 2.5 07/21/2022 1143   LYMPHSABS 1.8 05/05/2010 1049   MONOABS 0.8 07/21/2022 1143   MONOABS 0.4 05/05/2010 1049   EOSABS 0.6 07/21/2022 1143   EOSABS 0.2 05/05/2010 1049   BASOSABS 0.1 07/21/2022 1143   BASOSABS 0.0 05/05/2010 1049    Lithium  Lvl  Date Value Ref Range Status  02/22/2023 0.7 0.6 - 1.2 mmol/L Final  02/22/23 lithium  0.7 on 900 mg daily. 04/01/22 lithium  level 0.9 on 1200 mg daily  02/02/21 lithium  level 0.6 on 900 mg daily  05/2023 vit D 30 started 5000 units helping fatigue  No results found for: "PHENYTOIN", "PHENOBARB", "VALPROATE", "CBMZ"   .res Assessment: Plan:     Tevon was seen today for follow-up, depression, anxiety and fatigue.  Diagnoses and all orders for this visit:  Bipolar II disorder (HCC)  GAD (generalized anxiety disorder) -     PARoxetine  (PAXIL ) 30 MG tablet; Take 1 tablet (30 mg total) by mouth  daily.  Mixed obsessional thoughts and acts -     PARoxetine  (PAXIL ) 30 MG tablet; Take 1 tablet (30 mg total) by mouth daily.  OSA (obstructive sleep apnea)  Lithium  use  Low vitamin D  level  Low serum vitamin B12  Alcohol dependence in remission (HCC)      Chronic recurrent rumination with anxiety and urgency and sense of need and instablity with impatience ongoing with frequent phone calls between appts. but  but anhedonic depression and anxious.  Needs a lot of reassurance. Chronic worry and overwhelmed easily and misattributes sx as SE of meds leading to inadequate med trials.  Fear of meds.Falsely attributes his psych sx to the meds and then stops meds AMA.  False attribution of sx as SE.  But overall this is less of a problem than it was. Discussed the use of a mood chart which may help address some of this part attribution and better delineate mood cycling.  Gave him a copy of the chart reviewed in detail with him as to how to keep it.  Anhedonic dep and anxiety.  Dep worse than anxiety.  Anxiety average.  Less restless.  Anxiety was better but not gone on fluvoxamine  150 mg .   he describe history other manic cycling symptoms including increased interest, increased goal-directed behaviors, increased urge to spend that will last for 6 weeks or so and then stop.  He has these cycles about 3 times a year.  This is suggestive of a bipolar predisposition which was confirmed by recent cycle into mania from clomipramine .  Continue CPAP use. New CPAP machine.  Discussed potential metabolic side effects associated with atypical antipsychotics, as well as potential risk  for movement side effects. Advised pt to contact office if movement side effects occur.  He agrees.  Call if you have any problems with this. Best option not pursued.   Increased quetiapine  ER  600 mg pm.  May redue later.  He remains sober.    Bc late 2023 mania with clomipramine  increased lithium  1200  mg daily  .   He cut it back to 900 mg daily.  He admits to feeling worse when he stopped the lithium .  Call if sx get worse. Counseled patient regarding potential benefits, risks, and side effects of lithium  to include potential risk of lithium  affecting thyroid  and renal function.  Discussed need for periodic lab monitoring to determine drug level and to assess for potential adverse effects.  Counseled patient regarding signs and symptoms of lithium  toxicity and advised that they notify office immediately or seek urgent medical attention if experiencing these signs and symptoms.  Patient advised to contact office with any questions or concerns. 02/02/21  02/02/21 lithium  level 0.6 on 900 mg daily, normal B12 and vitamin D  52 04/01/22 lithium  level 0.9 on 1200 mg daily  02/02/21 lithium  level 0.6 on 900 mg daily 02/22/23 lithium  0.7 on 900 mg daily.  Disc the off-label use of N-Acetylcysteine at 600 mg daily to help with mild cognitive problems.  It can be combined with a B-complex vitamin as the B-12 and folate have been shown to sometimes enhance the effect.  Partial benefit so continue NAC 1200 mg daily.  Continue vitamin D  DT history level 30  Constipation management 1.  Lots of water 2.  Powdered fiber supplement such as MiraLAX, Citrucel, etc. preferably with a meal 3.  2 stool softeners a day 4.  Milk of magnesia or magnesium tablets if needed  Plan: Reduce fluvoxamine  to 1/2 tablet and add 1/2 paroxetine  for 5 days, Then stop fluvoxamine  and increase paroxetine  to 1 tablet daily.    Try to stay as active as possible.  Please see After Visit Summary for patient specific instructions.  FU 6 weeks  Nori Beat, MD, DFAPA     Future Appointments  Date Time Provider Department Center  09/06/2023  1:00 PM Cottle, Kennedy Peabody., MD [facility] None  02/19/2024 10:40 AM [facility]ANNUAL WELLNESS VISIT [facility]    No orders of the defined types were placed in this encounter.        -------------------------------

## 2023-07-28 ENCOUNTER — Telehealth: Payer: Self-pay

## 2023-07-28 NOTE — Telephone Encounter (Signed)
 B12 lab was not drawn at physical due to pt not having sx. Lm for return call but no answer.

## 2023-07-28 NOTE — Telephone Encounter (Signed)
 Copied from CRM [id number]. Topic: Clinical - Lab/Test Results >> Jul 28, 2023 12:46 PM Jonathon Walker C wrote: Reason for CRM: Patient called in stated he was looking over his labs, wanted to know if his b12 was checked as he didn't see it and also wanted to know about his glucose being high is there something he needs to do . Would like a callback

## 2023-07-31 NOTE — Telephone Encounter (Signed)
 E2c2 will let agent know Nils Baseman does not work on [employer]

## 2023-08-01 NOTE — Telephone Encounter (Signed)
 Patient notified of update  and verbalized understanding.

## 2023-08-02 NOTE — Telephone Encounter (Signed)
 Copied from CRM [id number]. Topic: General - Other >> Jul 31, 2023  1:11 PM Orien Bird wrote: Reason for CRM: Patient was returning call from nurse on Friday and would like for nurse to reach out to him on tomorrow when she return to work.

## 2023-08-07 ENCOUNTER — Telehealth: Payer: Self-pay | Admitting: Psychiatry

## 2023-08-07 NOTE — Telephone Encounter (Signed)
 Jonathon Walker called and LM at 2:39 stating the her started the Paxil  about 2 weeks ago.  It started doing really well. He felt great, but after the 1st few days it stopped helping and he feels is back to the beginning.  Please call to discuss.  Next appt 6/18.

## 2023-08-07 NOTE — Telephone Encounter (Signed)
 Paxil  prescribed on 5/8. Has not been long enough to get full benefit.

## 2023-08-08 NOTE — Telephone Encounter (Signed)
 Told patient that it hadn't been long enough to get full benefit and that he needed to give it a couple more weeks.

## 2023-08-09 ENCOUNTER — Ambulatory Visit (HOSPITAL_BASED_OUTPATIENT_CLINIC_OR_DEPARTMENT_OTHER)
Admission: RE | Admit: 2023-08-09 | Discharge: 2023-08-09 | Disposition: A | Discharge status: Home / Self Care | Source: Ambulatory Visit | Attending: Adult Health | Admitting: Adult Health

## 2023-08-09 ENCOUNTER — Encounter: Payer: Self-pay | Admitting: Adult Health

## 2023-08-09 DIAGNOSIS — I700 Atherosclerosis of aorta: Secondary | ICD-10-CM | POA: Diagnosis not present

## 2023-08-09 DIAGNOSIS — Z122 Encounter for screening for malignant neoplasm of respiratory organs: Secondary | ICD-10-CM | POA: Diagnosis not present

## 2023-08-09 DIAGNOSIS — F17210 Nicotine dependence, cigarettes, uncomplicated: Secondary | ICD-10-CM | POA: Insufficient documentation

## 2023-08-09 DIAGNOSIS — Z720 Tobacco use: Secondary | ICD-10-CM | POA: Insufficient documentation

## 2023-08-09 DIAGNOSIS — J439 Emphysema, unspecified: Secondary | ICD-10-CM | POA: Diagnosis not present

## 2023-08-10 ENCOUNTER — Other Ambulatory Visit: Payer: Self-pay | Admitting: Adult Health

## 2023-08-10 MED ORDER — NICOTINE 21 MG/24HR TD PT24: 21.0000 mg | MEDICATED_PATCH | Freq: Every day | TRANSDERMAL | 0 refills | Status: DC

## 2023-08-15 ENCOUNTER — Telehealth: Payer: Self-pay | Admitting: Psychiatry

## 2023-08-15 NOTE — Telephone Encounter (Signed)
 LVM to [facility]

## 2023-08-15 NOTE — Telephone Encounter (Signed)
 Patient lvm at 11:05 stating that he was having " bad leg cramping from down to feet. He says that this could be a result of to much Paxil  and Seroquel  together and has decreased his dosage of Seroquel  from 600mg  to 300mg . Leg cramps as well as his agitation. He would like a rtc to discuss [phone]

## 2023-08-16 NOTE — Telephone Encounter (Signed)
 Pt reported he was having bad leg cramps from his hips to his feet. He said he googled and read that taking Seroquel  and Paxil  could give him too much dopamine. He decreased Seroquel  from 600 mg to 300 mg and reports after 2 days the cramps went away, he is more relaxed, less agitated, and is sleeping well. He is taking: Paxil  30 Lithium  900 Seroquel  300  He doesn't need/isn't asking for anything, just wanted to make you aware.

## 2023-08-18 NOTE — Telephone Encounter (Signed)
 Noted. I agree with the change he made.

## 2023-08-29 ENCOUNTER — Ambulatory Visit: Payer: Self-pay | Admitting: Adult Health

## 2023-09-06 ENCOUNTER — Encounter: Payer: Self-pay | Admitting: Psychiatry

## 2023-09-06 ENCOUNTER — Ambulatory Visit: Admitting: Psychiatry

## 2023-09-06 DIAGNOSIS — E538 Deficiency of other specified B group vitamins: Secondary | ICD-10-CM

## 2023-09-06 DIAGNOSIS — F3181 Bipolar II disorder: Secondary | ICD-10-CM

## 2023-09-06 DIAGNOSIS — F411 Generalized anxiety disorder: Secondary | ICD-10-CM | POA: Diagnosis not present

## 2023-09-06 DIAGNOSIS — G4733 Obstructive sleep apnea (adult) (pediatric): Secondary | ICD-10-CM

## 2023-09-06 DIAGNOSIS — F422 Mixed obsessional thoughts and acts: Secondary | ICD-10-CM | POA: Diagnosis not present

## 2023-09-06 DIAGNOSIS — R7989 Other specified abnormal findings of blood chemistry: Secondary | ICD-10-CM

## 2023-09-06 DIAGNOSIS — Z79899 Other long term (current) drug therapy: Secondary | ICD-10-CM

## 2023-09-06 DIAGNOSIS — F1021 Alcohol dependence, in remission: Secondary | ICD-10-CM

## 2023-09-06 MED ORDER — QUETIAPINE FUMARATE ER 200 MG PO TB24: 200.0000 mg | ORAL_TABLET | Freq: Every day | ORAL | 0 refills | Status: DC

## 2023-09-06 MED ORDER — LITHIUM CARBONATE 300 MG PO CAPS: 900.0000 mg | ORAL_CAPSULE | Freq: Every evening | ORAL | 0 refills | Status: DC

## 2023-09-06 NOTE — Progress Notes (Signed)
 Jonathon Walker [id number] 1966-01-12 58 y.o.  Subjective:   Patient ID:  Jonathon Walker is a 58 y.o. (DOB 02-Nov-1965) male.  Chief Complaint:  Chief Complaint  Patient presents with   Follow-up   Depression   Anxiety   Medication Reaction    Jonathon Walker presents to the office today for follow-up of anxiety , depression, alcohol.    seen August 7.  His anxiety was unmanaged at Paxil  40 mg and olanzapine  5 mg so olanzapine  was increased to 7.5 daily.  seen December 17, 2018.  His anxiety was unmanaged.  The following change was made: Trial 1-1/2 of the 7.5 mg tablets of olanzapine  for anxi.  He is had a stay at [facility] for alcohol dependence since he was last here. Stayed 28 days and DC before Thanksgiving.  Very helpful.  Was having NM nighly before and they stopped since then.  Not drinking.  Involved in AA since then.  seen March 25, 2019.  The patient was doing better requested a reduction in olanzapine  to 7.5 mg nightly to improve energy.  visit April 03, 2019.  He had remained sober and his anxiety was improved with sobriety.  He was having problems with low motivation and forgetfulness.  At the next refill it was decided olanzapine  would be reduced from 7.5 to 5 mg daily. He called requesting this urgent appointment because of worsening symptoms associated with returning to work.  seen May 21, 2019.  He was doing relatively well.  The following was noted: Tolerating Wellbutrin  and it seems to help. He didn't tolerate the increase. Anxiety is improved off alcohol.  Ok to reduce olanzapine  to 2.5 mg daily for a month and stop it.  Hopefully low motivation will improve.   He called back March 23 stating he was more depressed and having a harder time doing routine tasks and having suicidal thoughts.  Because of worsening depression after reducing the olanzapine  he was encouraged to increase olanzapine  back to 5 mg nightly. He called again March 24 stating he was  really struggling but was scheduled for an appointment on March 26.  As of June 14, 2019 he reports the following: Every day a real hard struggle to get through the days.  More anxious and depressed equally. Worse 2 weeks.  Last Saturday so overwhelmed by how he feels he had SI.  Everything is hard. Initial benefit paroxetine  has been lost.  Awakens stressed and tired. Adequate hours of sleep.  Will be major chore just to mow grass.  Still sober. SE sexual. Getting appt with CPAP doc. Changes made include: Increasing olanzapine  back to 5 mg daily a couple of days prior to the appointment due to phone call and the addition of lithium  300 mg for a few days then increase to 600 mg nightly both to augment the antidepressant and because of suicidal thoughts.  July 15, 2019 appointment, the following is noted: Doing OK with both depression and anxiety.  Kind of like a top always going in mind.  Even when I sleep, when awakens mind still going.  NM nightly for years but less than before stopped drinking.  Smaller and less severe ones that don't wake him.  Theme can't finish things.  Both depression and anxiety 3/10.  More productive.  Paces himself.  Can live with it.  Sleep 9 hours.  Initially olanzapine  stopped the ruminating but it's back some.  Overall still benefit.  No SI and can't rmember why he  had them before. Residual energy not great and some ruminating. He had some concerns about sexual side effects from Paxil  but agreed that no med changes were necessary despite residual symptoms of depression and anxiety.  08/14/2019 appointment, the following is noted:  He scheduled urgently. Vaccinated. Angry easily and exhausted and everything is a chore.  Exhausted. Never had appt with CPAP company.  CPAP is 59 years old and on same settings as in the past.  CPAP machine is not working properly but when it did it was helpful for alertness and energy.  Disc need to call them to have it evaluated. To bed  10-7.  Don't feel rested in morning but used to feel rested when first started paroxetine . Plan: Increase Lithium  continue to 900 mg daily for irritability  11/19/19 appt with the following noted: He increased to 900 mg and then reduced it but doesn't know why.  Reduced it to 300 nightly. Irritability much better.  Once anger since here with rage but not outward. Still on paroxetine  40, olanzapine  5, Wellbutrin  75 AM. Getting tired in afternoon and worrying about getting depressed but he's not depressed now.  Don't enjoy things like he used to.  Les interest and excitement. Appetite and sleep are normal.   Still adjusting without alcohol and socialization. Worrying about lack of sex drive with paroxetine . Can still ruminate on things until he gets some reassurance through talking with people. Plan: Continue low-dose lithium  300 mg daily Continue Paxil .  Did not feel well at 60 mg so we will continue 40 mg.  Likely to lose anxiety benefit if change it. Continue low-dose Wellbutrin  75  Tolerating Wellbutrin  and it seems to help. He didn't tolerate the increase.   Continue olanzapine  5 mg  02/18/2020 appointment with following noted: Open to trying increase paroxetine  again bc ruminating wears him out and gets fatigued and it wears him out though is 80% better vs before paroxetine . Doesn't remember trying higher paroxetine .  Ruminates on relationships or work.  Whenever he can talk about it with someone it helps tremendously but also realizes paroxetine  helps. Anxiety affects dreams and NM too.  Up and down a little.  Anxiety drove depression before paroxetine .  Best med he's tried. SOBER 1 YEAR SE no problem.  Except gained 25# in 4 years.  No change in diet and exercise.  No increase in appetite and no food cravings.  Seeing PCP soon. Plan: Increase paroxetine  trial to 60 for rumination.  Disc SE.  05/26/20 appt noted: No benefit increase paroxetine  nor SE.  Wants to reduce back to 30 mg  paroxetine  and stop lithium  bc it didn't seem to help.   4 days of Calm app has seemed to help rumination.  Does it in afternoon and before bed and it seemed to helps.  Doing a 10 min meditation and it helps.   Dep 4/10.  Anxiety 4/10. And a lot better the last few days.   Plan: Reduce lithium  by 1 tablet per week.  Call if there is any suicidal thought. Starting April 1 reduce paroxetine  to 40 mg daily. Continue low-dose Wellbutrin  75  Tolerating Wellbutrin  and it seems to help. He didn't tolerate the increase.   If doing OK will try taper as he suggested at next visit. Continue olanzapine  5 mg nightly it was helpful for depression and anxiety.  07/01/2020 appt with following noted: Moved appt up. Now says he understated depression level when he was here the last time. Restarted lithium  bc  felt more depressed without it. Reduced paroxetine  to 40 mg daily. Lethargic.  Mind won't calm down.  Nonrestorative sleep but 8 hours.  Doesn't think sleep is deep enough.  Tense. Goes to [employer]. Started counseling. Wants to try something different with meds. Can have mixed sx for 6 weeks with increased interest in things and motivation and want to spend money but still feels depressed.  3 times in the last year. Plan: Increase  lithium  back to 3 daily to see if depression is better and less mood cycling.  He admits to feeling worse when he stopped the lithium  Continue the reduce paroxetine  to 45 mg daily. DC Wellbutrin  Lamotrigine  trial 25 mg to 100 mg daily over 4 weeks. Continue olanzapine  5 mg nightly it was helpful for depression and anxiety.  08/26/2020 appointment with the following noted: Sad and down.  Classic depression sx and very fatigued.  Unusual to have classic depression bc usually is atypical.  No effect from lamotrigine . Wonders if atorvastatin  is causing some confusion or depression bc read about it. Depressed for 3 mos. Gained 20#.  It's leveled off now. Olanzapine  helped  the rumination and anxiety.   Life is more depressing bc not seeing friends like it was bc stopped drinking.   No SE with lithium .  Unless dropping things. Sleep 1030 to 6 but doesn't sleep deep.  Not rested in AM Plan: Increase  lithium  back to 3 daily to see if depression is better and less mood cycling.  He admits to feeling worse when he stopped the lithium  Continue the reduce paroxetine  to 40 mg daily bc it helped anxiety. Lamotrigine  increase to 150 mg daily DT no benefit or SE Increase olanzapine10 mg nightly it was helpful for depression and anxiety.  Need more help with depression   09/11/2020 phone call: Pt stated he is foegetting things throughout the day and very unsteady.He works with tools and equptment and this is a problem.He stated he takes 6 Lamictal  in the am. MD response: We made 3 med changes at the last visit including increasing lamotrigine  to 150 mg daily, increasing olanzapine  to 10 mg daily, and restarting lithium .  Any 1 of those changes could possibly cause some of the side effects.  We will have to make 1 change at a time to evaluate this.  Therefore reduce lamotrigine  to 100 mg daily.  It may take a couple of weeks before he sees a difference.  11/13/2020 phone call: Patient lm stating he is currently taking Lamotrigine  150 mg. He has decreased it to 75 mg due to side effects per pt ( blurred vision, shaking and coordination issues). Patient stated he is discontinuing before his scheduled October appointment. MD response: Take the lamotrigine  75 mg for 2 weeks and he should be able to stop then without withdrawal.  If he gets shakey, nervous then we need to reduce more slowly and let us  know that.  Otherwise he can stop if he follows these directions.  11/30/2020 phone call: Pt called and advised he would like to taper off of the Olanzapine .  He has gained 8# since his last visit and 30# total since he started taking the med.  He feels steady, not so much stress now, and  would like to see if he can take Lorazepam instead.  Then talk to Dr. Toi Foster in Oct when they meet. MD response: Reduce olanzapine  to one half of the 10 mg tablet at night for 4 weeks and then stop it.  If he gets more depressed then get back in touch with us  and we will try to do something about the weight gain that olanzapine  can cause by using the alternative Lybalvi  12/07/2020 appointment with the following noted: Off lamotrigine  .  Reduced olanzapine  to 5 mg for a week.  On lithium  900 mg HS, paroxetine  40 Fatigue 10/10.  Dizziness resolved off lamotrigine .  Low fatigue and motivation.  I want to want to do things.  Stopped drinking almost 18 mos and socially big adjustment.   Still invloved with AA and daily sponsor.  Working [employer].  [address] [employer] helped. Thinks mild depression with little interest and motivation and no joy.  But not severe.  No SI. No anxiety and it's way better.  Life is more stable and helps.  Working 4 hours daily and occ extrea jobs. Uses new CPAP.  Stil drowsy and fatigue. Plan: Continue CPAP use. New CPAP machine. Trial modafinil  100-200 mg AM Lamotrigine  off DT NR Reduced olanzapine  to 5 mg daily for a week and well stop in 3 weeks DT wt gain and fatigue  01/20/2021 phone call: I called patient to see if I could get better information. He states he started modafinil  a couple of weeks ago, though Rx was written for in September. He states he didn't feel well before taking the modafinil  though. He just says he is agitated, irritable, and unstable feeling. When asked what he meant by unstable he stated feeling a shell of myself.  He has been put on the cancellation list.  MD response: It is likely the modafinil  that is causing this problem.  But it may be corrected with a dose reduction.  I assume he is taking a whole tablet.  If that is the case time to reduce to 1/2 tablet in the morning.  If he is taking half a tablet tell him to reduce it to a quarter of a tablet.   And I agree we need to try to get him in as soon as possible. He reduced modafinil  from 200 mg to 100 mg daily.  02/10/2021 appointment with the following noted: Current psych meds lithium  900 mg nightly, paroxetine  40 mg daily, olanzapine  recently stopped, modafinil  recently tried. Was really bad for awhile feeling depresssed and agitated and anxiety but better now including after reducing modafinil  to 100 mg daily. Fatigue is better and in the middle now with energy, not good but not bad. Sleep is better.  CPAP doctor yesterday and they are gathering info pending. NAC is helping with memory. Still sober and committed to it. SE constipation  Plan: No med changes  03/10/2021 phone call from patient complaining of vague feelings of not feeling well and wanting the Paxil  changed.  He was placed on the cancellation list.  03/18/2021 appointment urgently scheduled at his request: Remains on paroxetine  40 mg daily, lithium  1200 mg daily, modafinil  200 mg every morning. Ruminating  with sense of need to talk to someone.  Then feels better for awhile.  Mostly on relationships with family.  No one to talk to. Wonders need to chang ethe meds.   Don't socialize anymore since sober.  Does online AA everyday at noon. Lost 10# off olanzapine . Plan: Continue paroxetine  40 mg, lithium  1200 mg daily, Try stopping modafinil  100mg  AM to determine if it is helping or not. If not less anxious then start risperidone  for rumination 1-2 mg HS  04/05/2021 appointment with the following noted: Stopped  modafinil  and it helped  reduce anxiety. Started risperidone  2 instead of 1 mg and was off balance so reduced risperidone  to 1 mg HS. Only done this for 3 nights.  No SE with it. Anxiety is not close to as bad as originally.  Can be painful but not now. Still ruminating some my whole adult life but varies.  Anxiety can lead to depression. 6-7/10 with 10 good on depression with some problems with  ambition. Plan: Continue trial risperidone  for rumination 1 mg HS  04/07/2021 TC: Several phone calls: Called 2 days after the visit complaining of itching from the lithium  and wanting to come off of it.  He was warned he was more emotionally distressed when he reduced the lithium  in the past but he wanted to pursue it anyway.  He was continuing risperidone  1 mg nightly but wanting to reduce that also but was warned he would almost certainly have worsening mood severity and anxiety problems based on past experience.  He was cautioned against reducing lithium  below 900 mg daily and risperidone  below 1 mg nightly.  He agreed on 04/09/2021.  04/09/2021 he called again and several phone calls ensued thereafter.  He insisted on stopping lithium  and risperidone  despite warnings that his mood stability and anxiety would worsen. He was prescribed Depakote  to take the place  04/20/2021 appointment with the following noted: He stopped risperidone  and did not take the Depakote .  He is taking lithium  900 mg nightly and paroxetine  40 mg daily. Stopped risperidone  bc feeling agitated and unstable and blamed it.  Still feels the same off of it. Says itching 85% better with less lithium .  Itching for a couple of mos.  Doesn't think anxiety noticeably worse after reducing lithium  to 900 mg daily. Not dizzy.  Clumsy with hands. Anxious but not painful like it was before Paxil  but tense at the end of the day.  Initially felt normal for the first time in 20 years when first started paxil  but not as good now. Not sadness and hasn't had much of it. Started therapy with doctor on demand. Ruminating on fear of not being fully present and worries about little things like appts pending.   Afraid of working FT because gets overwhelmed bc I take it home with me Plan : Continue trial risperidone  but increase to rumination 1.5 mg HS Continue paroxetine  40 and lithium  600 mg   06/01/2021 appointment with the following  noted: Haven't seen benefit with risperidone  1.5 mg daily. Not in pain but feels disconnected and not myself.  Has felt like this off and on for years. Level of angst all day but not real high unless triggered.  Still has the rumination. Initially a lot of benefit from paroxetine  Not markedly deprssed and not sad.  07/14/21 appt noted:  Doesn't feel much different with switch to fluvoxamine  except anxiety well controlled. But still low energy, motivation and not as productive as he wants to be.  Not markedly sad.  Asks about Vraylar . Not much interest or enjoyment. Don't sleep that great and don't relax that great. On fluvoxamine  200, lithium  900, off risperidone . Plan: Anxiety controlled by fluvoxamine  200 Potentiate Vraylar  1.5 mg daily  07/27/2021 phone call complaining of ongoing depression.  Vraylar  increased to 3 mg daily. 07/29/2021 phone call complaining of cost of Vraylar .  Was given samples. 08/20/2021 phone call complaining of fatigue.  Stated he stopped the Vraylar  because it made him agitated.  It was recommended there be no further med changes because he is only been  off the Vraylar  for 1 week.  08/30/2021 appointment noted: Vraylar  1.5 mg didn't do much.  Increased to 3 mg and then felt irritable ans stopped it about 3rd week of May.  Better now. Still extremely tired and feels physically sick with normal workup. Thinks his mind wears him out.  Racing negative thoughts all the time. Asks about Ativan. Always nervous and anxious.  Trouble discerning anxiety from depression. Plan: Anxiety was controlled by fluvoxamine  200 but now he thinks it is worse. So increase 250 mg daily  10/18/21 appt noted: No results with increase fluvoxamine .  No SE. Anxiety is still worse than depression.  Thinks it causes somatic sx like the flu.  Exhausted. GI. Poor sleep and concentration. Asks about lorazepam.   Got checked out by PCP without findings. Plan: Reduce lithium  to 2 capsules daily to  see if physical symptoms like fatigue etc. are better Increase fluvoxamine  to 1 tablet in the morning and 2 tablets in the evening to reduce anxiety Plan Trial reduction of  lithium  900  to 600 mg daily to see if somatic sx are better like fatigue.   01/06/22 appt noted Everything is about the same with med changes. CC anxiety and fatigue.  Sx started with severe depression in 20s and got better but never resolved. Thinks anxiety is causing fatigue.  Has to nap about 1 pm.  Easily fatigued.  No particular worry other than how he feels and himself.. Plan: Reduce fluvoxamine  gradually to 150 mg daily Start clomipramine  25 mg nightly for 5 days and increase to 75 mg HS  02/07/22 TC:  Pt lvm that the clomiprame 25 mg has been taking the full dose for two weeks now. He is experiencing mania. Overspending etc    MD resp:  Increase the lithium  back to 3 daily.  Stop the clomipramine .  Resume the risperidone  until these symptoms clear up.  Call us  back next week and let us  know how it is going.     02/16/22 TC:   Pt was just calling to give you an update.He stated he is still 50 percent manic but feels better.He is currently taking   lithium  carbonate 300 MG 3 daily risperiDONE  3 mg 1/2 tab daily fluvoxamine  100 MG tablet 1/2 tablet in the AM and 1 tablet at night On 11/20 changes were made,you had him stop clomipramine                      and increase lithium  to 900 mg daily.  02/16/22 MD: check lithium  level.  02/24/22 tC complaining of depression. MD :  He has had recent mood swings from manic to depressed by his report.  Lithium  level is low at 0.5 on 600 mg daily.  I think the most helpful thing for his mood would be to increase lithium  back to 900 mg daily.      03/07/22 TC: pt reported increasing his Luvox  to 200 mg daily.  03/08/22 appt noted: Multiple phone calls since here.   Clomipramine  triggered mania like he's never had before.  Increased over the top looking for a house,  wanting to invent things, bought a shed and fish tank abruptly.  Left his job which was a good thing.  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  I feel a lot better about it.  Has disability and will do PT work.  I'll be OK. Exhausted and can't be on his feet all the time.  Sleep is different with EMA and ready to go but watches TV awhile and then goes back to sleep.  Always a late sleeper all his life.   Still feeling some depression and he increased the luvox  to 200 and it helped. Thinks parents are Autistic and don't respond emotionally.  M cannot understand feelings and he doesn't get any support.   Plan: Bc recent mania with clomipramine  increase lithium  1200  mg daily .    03/29/2022 appointment urgently due to recent mania: Psych meds: fluvoxamine  200, lithium  1200, risperidone  1.5 mg HS Exhausted until the last couple of days. SE tremor Has not had any water today and is lightheaded.   Mood a little better and anxiety better than it was.  No mania. Some underlying weight in mood that only responded to paroxetine  and clomipramine . Asks about Wellbutrin  for energy. Sleep normal 8-9 hours now.  Except last night. Goes to [facility] groups and support group and AA. Sober Reduce risperidone  to 1 mg to see if energy better  05/25/22 appt noted: Reduced lithium  about 10 days ago bc thought he might be getting too much bc read on the internet.  No changes since reducing it. More dep for a couple of mos which is unusual.  In bed a lot and more isolated and not normally what I have.  Not real sad but unmotivated and no energy to do things.    Start therapist Friday. No sig sadness.  Irritable and short with people over little things.  Kind of started after stopped drinking.  Worse.  Sleep on and off.  Erratic.  Looks for ward to night so can escape into sleep.   Anxious underlying for 30 years.  Hard to sit and relax and just watch TV.  Tends to ruminate. Asks to try Gaba. Now lethargy is worse  than chronic anxiety Reducing risperidone  made no changes good or bad. DC risperidone  1 mg HS Add Latuda  1/2 tablet for 1 week then 1 tablet in the the evening  07/06/22 appt :  Increased Latuda  up to 100 mg .  No clear SE. Exhausted mentally and it makes his body tired.  Working 10-2 and then comes home and lays in bed a couple of hours.  Not a deep sleep.  Never feels fully rested.  Sleep HS 10-7.   Normal appetitie.   Still some sadness and irritability.  Anxiety doesn't seem better but is having less mood swings and mania.  Anxiety wears him down.   Plan: Increase  Latuda  120 mg in the evening with food  07/21/22 appt noted: Trouble keeping mood chart.  If I talk and I'm heard then feels better for hours but then damn stopped up.  Almost like an obsessive thing to talk for relief. No clear effect of increase Latuda  except reduced som cognitive anxiety but not physical anxiety. Says there was med he took before paxil  that gave him instant relief and wonders about retrying it.  Doesn't know which med. Paxil  had best effect when it worked for a period of time. Exhausted but fidget when sits.   Dep and anxious moderate.  09/01/22 appt noted: Meds clonazepam  0.5 mg BID prn, fluvoxamine  200 mg daily, lithium  900 HS, latuda  120 mg daily, vit D3 This type of dep since November after mania is low energy, anhedonia, low appetite, a lot of time in bed, textbook depresssion, lack of interest but not debilitating sadness.   Tired of it.  Dep is worse than anxiety  and used to be the other way around.  Not as irritable. In past dep was anxious and disconnected and wasn't as tired.  Had to leave work bc can't get himself out of bed.   Toss and turn in bed fidgety in bed, feels like has to move but no energy to walk.  Fidgety for 3 mos. Clonazepam  takes the edge off but feels a higher dose is needed.  Plan: Changes: Reduce Latuda  to 60 mg daily. Start samples of Auvelity  1 in the AM for dep that is  TRD  10/20/22 appt noted: Meds clonazepam  0.5 mg BID prn, fluvoxamine  200 mg daily, lithium  900 HS, latuda  60 mg daily, vit D3, out of Auvelity  after 2 weeks. Less restless but still has it 6/10 after reduced Latuda .  Thinks he had it before eLatuda but not sure. Takes an hour to get to sleep.   No change in dep from this visit to last visit. Did have positive effects from Auvelity  but ? SE conc.  Did help energy Lately more dep than anxiety.  Noted ASA helps anxiety. Couple times situational anger but no mood swings. Plan: Changes: Reduce fluvoxamine  to 1 and 1/2 tablet daily to see if restlessness better Reduce Latuda  from 60 to 40 mg daily to see if restlessness better Retry Auvelity  1 in the AM for 7 days then 1 twice daily  11/14/22 appt noted: Twitching and restlessness better.   Still some teeth grinding but better Auvelity  didn't help mood.  Ongoing classic lethargy, poor appetite.  Can't exercise bc depression.  Little tasks overwhelming.    clonazepam  0.5 mg BID prn, fluvoxamine  150 mg daily, lithium  900 HS, latuda  40 mg daily, vit D3,  Auvelity  BID SE constipation and bruxism. Sleep fair and best and most relaxed 730-930 AM.  To bed 10 pm.   12/26/22 appt noted: More relaxed if not underpressure but not working and needs to. Not much motivation or energy in AM.  Doesn't notice the effects of Seroquel  for many hours, but it is relaxing med. For last 7 years hard time with motivation and capacity.   Happened years ago and then it resolved after 7 years with Wellbutrin  Plan: Changes none:  Continue 1/2 quetiapine  XR 150 mg PM.  This is helping him to feel more calm in the morning than he has felt in a very long time.  However he thinks now he is a little too subdued but does not want to do reduce the dose any further.  Does not tolerate a higher dose because it is too sedating.  01/09/23 TC:  Patient taking 75 mg of Seroquel  and sleeping great at night, but also sleeping a lot  during the day. He reports he doesn't want to be around people, is content to be at home not doing anything. He rates depression as 7/10, anxiety 5/10. Reports anxiety is better since starting Seroquel . He also takes: Fluvoxamine  150 mg at bedtime Lithium  900 mg at bedtime, though 1200 is prescribed. No longer taking clonazepam .    MD resp:  There are a limited number of options left to us  and it is important that each med be tried at his most effective dose.  Seroquel  is FDA approved for depression in doses of 150 mg and higher.  The low dose he is currently on is enough to help anxiety and his sleep but probably not enough to help depression.  We may need to go as high as 200 or 300 mg a day.  He is probably fearful of being too sedated at higher doses but this medicine does not necessarily make people more sleepy at higher doses.  I think she got to go up to the full quetiapine  XR 150 mg daily and give that a try until his appointment with me.      02/08/23 appt noted: Psych med: no Clonazepam  0.5 mg twice daily as needed anxiety, fluvoxamine  150 nightly, lithium  900 nightly, quetiapine  XR 150 at 3 pm Less am drowsiness taking Seroquel  in the afternoon. Chronic fatigue unchanged.  Rare days without it.  Can do one task daily.  Can work 1 and 1/2 hours daily.  Naps about 2 pm.   Seroquel  helps sleep and anxiety.   Easily overwhelmed if multiple tasks.   Feels much better in the AM than in years.  By noon is worn out.   Plan: Increase for TRD quetiapine  XR 200 mg PM.   03/23/23 appt noted: Meds as above except incr Seroquel  XR 200 mg daily. Anxiety better but still some dep with less motivation maybe 20% better. No sig SE except dryness. URI for 8 days. Sleep 10 hours per usual.   Much more relaxed with Seroquel . Plan: Increase further for TRD quetiapine  XR 300 mg PM.   05/02/23 appt noted: Multiple phone calls since here complaining of ongoing sx and med rxns. Incr and decrease  fluvoxamine  to 150 now 100 complaining of no benefit for anxiety at the higher dose and feeling somewhat agitated.   Current meds:.  Fluvoxamine  100 nightly, lithium  900 nightly, quetiapine  ER 300 mg nightly. Felt annoyed, angry , agitated without reason and called.   Is a little better the last 10 days.  Not nearly as angry SE wt gain and constipation. Asks about switch to paxil  bc helped the most of anything.   Speaking to counselor.  Fearful childhood. Plan: Increase quetiapine  ER  to  200 mg 2 tablets in evening for 3 weeks then 3 tablets in the evening.   06/15/23 appt noted: Med: Seroquel  XR 600, lithium  900, fluvoxamine  150. Vit D 5000 helped Overall is a little better.  Less foggy.  Less dep. 30-40% better with combo. SE gained another 5# Vit D helped fatigue.  Still naps but less. Sleep is OK. Less angry but still has shortness.  But not as bad. Plan continue above longer  07/27/23 appt noted: Med as above 50-60% of himself.  Mind wears him out and then has to take a nap.  It's been a lot worse than this before but would like change meds to get better back to paroxetine .   No changes with increase Seroquel . Willing to come back to this if sx are not better with paroxetine .   Physical weakness is rough.  But doesn't work so doesn't have to do much .  Last year could work 4 hours daily but not now.  Get overwhelmed really quickly with little matters.  Gets irritable. Plan: Plan: Reduce fluvoxamine  to 1/2 tablet and add 1/2 paroxetine  for 5 days, Then stop fluvoxamine  and increase paroxetine  to 1 tablet daily.    08/16/23 TC:  Pt reported he was having bad leg cramps from his hips to his feet. He said he googled and read that taking Seroquel  and Paxil  could give him too much dopamine. He decreased Seroquel  from 600 mg to 300 mg and reports after 2 days the cramps went away, he is more relaxed, less agitated, and is sleeping well. He is taking: Paxil  30  Lithium  900 Seroquel  300  He  doesn't need/isn't asking for anything, just wanted to make you aware.        6+/18/25 appt noted:  Med:  Seroquel  XR 300 pm, paroxetine  30, no fluvoxamine  , lithium  900 HS.  Helped leg cramps with less Seroquel  XR and better mental clarity.   Paxil  helped anxiety tremendously.   Still dealing with depression.  Exhausted by 1 pm and naps.   Sleep regular hours but not restful.  Moderate helpful.  9 hours.  CPAP. SE no sig other than constipation and hard to lose wt. But not gaining. Not enough fluids and gets lightheaded.     On disability for 7-8 years.  Working PT [employer] for alcohol.  Sober 2 year 01/2019  Past psych meds:   Paxil  60 too much + wt gain,   duloxetine, Zoloft,  Viibryd 60 partial resp (trial after paroxetine ) SE diarrhea. Trintellix 10 agitated Wellbutrin  75 (max tolerated),   Fluvoxamine  300 no better than 200 which seems to take edge off anxiety Clomipramine  mania Auvelity  4 weeks NR  Abilify, Rexulti 2. Vraylar  irritable at 3 mg daily.  Latuda  120 fidgety Olanzapine  10 fatigue Risperdal  2 once Seroquel  XR 600 benefit partial  Buspar 30 BID stopped DT partial effect.  Xanax craving,   lithium   900, worse when he stopped lithium  Lamotrigine  NR CO dizzy  NAC 600 helped Modafinil  200 agitated Took lorazepam  in the past, Xanax less helpful  B severe OCD obsessions psych tx.  Review of Systems:  Review of Systems  Constitutional:  Positive for fatigue. Negative for unexpected weight change.  Cardiovascular:  Negative for palpitations.  Gastrointestinal:  Positive for constipation. Negative for nausea.  Neurological:  Negative for tremors.  Psychiatric/Behavioral:  Positive for dysphoric mood. Negative for agitation, behavioral problems, confusion, decreased concentration, hallucinations, self-injury, sleep disturbance and suicidal ideas. The patient is nervous/anxious. The patient is not hyperactive.     Medications: I have reviewed  the patient's current medications.  Current Outpatient Medications  Medication Sig Dispense Refill   amLODipine  (NORVASC ) 5 MG tablet Take 1 tablet (5 mg total) by mouth daily. 90 tablet 3   atorvastatin  (LIPITOR) 40 MG tablet Take 1 tablet (40 mg total) by mouth daily. 90 tablet 3   Cholecalciferol (VITAMIN D3) 125 MCG (5000 UT) CAPS Take 5,000 Units by mouth daily in the afternoon.     nicotine  (NICODERM CQ  - DOSED IN MG/24 HOURS) 21 mg/24hr patch Place 1 patch (21 mg total) onto the skin daily. 28 patch 0   PARoxetine  (PAXIL ) 30 MG tablet Take 1 tablet (30 mg total) by mouth daily. 90 tablet 0   lithium  carbonate 300 MG capsule Take 3 capsules (900 mg total) by mouth at bedtime. 270 capsule 0   QUEtiapine  (SEROQUEL  XR) 200 MG 24 hr tablet Take 1 tablet (200 mg total) by mouth at bedtime. 90 tablet 0   No current facility-administered medications for this visit.    Medication Side Effects: sexual, weight  Allergies: No Known Allergies  Past Medical History:  Diagnosis Date   Anxiety    Bimalleolar fracture of left ankle    Depression    Heart murmur    Sleep apnea    uses CPAP nightly    Family History  Problem Relation Age of Onset   Cancer Maternal Grandfather    Colon cancer Neg Hx    Colon polyps Neg Hx    Esophageal cancer Neg Hx  Rectal cancer Neg Hx    Stomach cancer Neg Hx     Social History   Socioeconomic History   Marital status: Single    Spouse name: Not on file   Number of children: Not on file   Years of education: Not on file   Highest education level: Some college, no degree  Occupational History   Not on file  Tobacco Use   Smoking status: Every Day    Current packs/day: 1.50    Average packs/day: 1.5 packs/day for 48.5 years (72.7 ttl pk-yrs)    Types: Cigarettes    Start date: 03/22/1975   Smokeless tobacco: Never   Tobacco comments:    He reports he has quit 3 times and recently started back in 2012. Hsm   Vaping Use   Vaping status:  Never Used  Substance and Sexual Activity   Alcohol use: Not Currently   Drug use: No   Sexual activity: Not on file  Other Topics Concern   Not on file  Social History Narrative   Not on file   Social Drivers of Health   Financial Resource Strain: Low Risk  (04/25/2023)   Overall Financial Resource Strain (CARDIA)    Difficulty of Paying Living Expenses: Not very hard  Recent Concern: Financial Resource Strain - Medium Risk (02/14/2023)   Overall Financial Resource Strain (CARDIA)    Difficulty of Paying Living Expenses: Somewhat hard  Food Insecurity: No Food Insecurity (04/25/2023)   Hunger Vital Sign    Worried About Running Out of Food in the Last Year: Never true    Ran Out of Food in the Last Year: Never true  Transportation Needs: No Transportation Needs (04/25/2023)   PRAPARE - Administrator, Civil Service (Medical): No    Lack of Transportation (Non-Medical): No  Physical Activity: Inactive (04/25/2023)   Exercise Vital Sign    Days of Exercise per Week: 0 days    Minutes of Exercise per Session: 10 min  Stress: Stress Concern Present (04/25/2023)   [employer] of Occupational Health - Occupational Stress Questionnaire    Feeling of Stress : Very much  Social Connections: Moderately Isolated (04/25/2023)   Social Connection and Isolation Panel    Frequency of Communication with Friends and Family: Three times a week    Frequency of Social Gatherings with Friends and Family: Once a week    Attends Religious Services: Never    Database administrator or Organizations: Yes    Attends Engineer, structural: More than 4 times per year    Marital Status: Never married  Intimate Partner Violence: Not At Risk (02/15/2023)   Humiliation, Afraid, Rape, and Kick questionnaire    Fear of Current or Ex-Partner: No    Emotionally Abused: No    Physically Abused: No    Sexually Abused: No    Past Medical History, Surgical history, Social history, and Family  history were reviewed and updated as appropriate.   Please see review of systems for further details on the patient's review from today.   Objective:   Physical Exam:  There were no vitals taken for this visit.  Physical Exam Constitutional:      General: He is not in acute distress.    Appearance: Normal appearance. He is well-developed.     Comments: Red face   Musculoskeletal:        General: No deformity.   Neurological:     Mental Status: He is alert and  oriented to person, place, and time.     Motor: No tremor.     Coordination: Coordination normal.     Gait: Gait normal.   Psychiatric:        Attention and Perception: Attention normal. He is attentive.        Mood and Affect: Mood is anxious and depressed. Affect is not labile, blunt, angry or inappropriate.        Speech: Speech normal.        Behavior: Behavior normal. Behavior is not agitated or slowed.        Thought Content: Thought content normal. Thought content is not delusional. Thought content does not include homicidal or suicidal ideation. Thought content does not include suicidal plan.        Cognition and Memory: Cognition normal.        Judgment: Judgment normal.     Comments: Insight  Fair. Continued mood problems  anxiety 3/10.   Dep improved 6/10       Lab Review:     Component Value Date/Time   NA 142 05/26/2023 1113   K 4.3 05/26/2023 1113   CL 107 05/26/2023 1113   CO2 26 05/26/2023 1113   GLUCOSE 106 (H) 05/26/2023 1113   BUN 23 05/26/2023 1113   CREATININE 1.22 05/26/2023 1113   CREATININE 1.01 03/01/2022 0904   CALCIUM  9.5 05/26/2023 1113   PROT 7.0 05/26/2023 1113   ALBUMIN 4.4 05/26/2023 1113   AST 18 05/26/2023 1113   ALT 39 05/26/2023 1113   ALKPHOS 97 05/26/2023 1113   BILITOT 0.4 05/26/2023 1113       Component Value Date/Time   WBC 6.4 05/26/2023 1113   RBC 4.81 05/26/2023 1113   HGB 15.2 05/26/2023 1113   HGB 15.2 05/05/2010 1049   HCT 43.8 05/26/2023 1113   HCT  44.8 05/05/2010 1049   PLT 197.0 05/26/2023 1113   PLT 193 05/05/2010 1049   MCV 91.0 05/26/2023 1113   MCV 89.6 05/05/2010 1049   MCH 30.4 05/05/2010 1049   MCHC 34.7 05/26/2023 1113   RDW 13.5 05/26/2023 1113   RDW 13.1 05/05/2010 1049   LYMPHSABS 2.5 07/21/2022 1143   LYMPHSABS 1.8 05/05/2010 1049   MONOABS 0.8 07/21/2022 1143   MONOABS 0.4 05/05/2010 1049   EOSABS 0.6 07/21/2022 1143   EOSABS 0.2 05/05/2010 1049   BASOSABS 0.1 07/21/2022 1143   BASOSABS 0.0 05/05/2010 1049    Lithium  Lvl  Date Value Ref Range Status  02/22/2023 0.7 0.6 - 1.2 mmol/L Final  02/22/23 lithium  0.7 on 900 mg daily. 04/01/22 lithium  level 0.9 on 1200 mg daily  02/02/21 lithium  level 0.6 on 900 mg daily  05/2023 vit D 30 started 5000 units helping fatigue  No results found for: PHENYTOIN, PHENOBARB, VALPROATE, CBMZ   .res Assessment: Plan:     Braison was seen today for follow-up, depression, anxiety and medication reaction.  Diagnoses and all orders for this visit:  Bipolar II disorder (HCC) -     QUEtiapine  (SEROQUEL  XR) 200 MG 24 hr tablet; Take 1 tablet (200 mg total) by mouth at bedtime. -     lithium  carbonate 300 MG capsule; Take 3 capsules (900 mg total) by mouth at bedtime.  GAD (generalized anxiety disorder)  Mixed obsessional thoughts and acts  OSA (obstructive sleep apnea)  Lithium  use  Low vitamin D  level  Low serum vitamin B12  Alcohol dependence in remission (HCC)   Chronic recurrent rumination with anxiety and urgency and  sense of need and instablity with impatience ongoing with frequent phone calls between appts. but  but anhedonic depression and anxious.  Needs a lot of reassurance. Chronic worry and overwhelmed easily and misattributes sx as SE of meds leading to inadequate med trials.  Fear of meds.Falsely attributes his psych sx to the meds and then stops meds AMA.  False attribution of sx as SE.  But overall this is less of a problem than it  was. Discussed the use of a mood chart which may help address some of this part attribution and better delineate mood cycling.  Gave him a copy of the chart reviewed in detail with him as to how to keep it.  Anhedonic dep and anxiety.  Dep worse than anxiety.  Anxiety average.  Less restless.   Option pramipexole but difficult to use with quetiapine .   f he describe history other manic cycling symptoms including increased interest, increased goal-directed behaviors, increased urge to spend that will last for 6 weeks or so and then stop.  He has these cycles about 3 times a year.  This is suggestive of a bipolar predisposition which was confirmed by recent cycle into mania from clomipramine .  Continue CPAP use. New CPAP machine.  Discussed potential metabolic side effects associated with atypical antipsychotics, as well as potential risk for movement side effects. Advised pt to contact office if movement side effects occur.  He agrees.  Call if you have any problems with this. Reduce per his request Seroquel  XR to 200 mg HS.  Disc risk option.  He agrees.  Call if you have any problems with this. Consider alternative Caplyta.    He remains sober.    Bc late 2023 mania with clomipramine  increased lithium  1200  mg daily .   He cut it back to 900 mg daily.  He admits to feeling worse when he stopped the lithium .  Call if sx get worse. Counseled patient regarding potential benefits, risks, and side effects of lithium  to include potential risk of lithium  affecting thyroid  and renal function.  Discussed need for periodic lab monitoring to determine drug level and to assess for potential adverse effects.  Counseled patient regarding signs and symptoms of lithium  toxicity and advised that they notify office immediately or seek urgent medical attention if experiencing these signs and symptoms.  Patient advised to contact office with any questions or concerns. 02/02/21  02/02/21 lithium  level 0.6 on 900 mg  daily, normal B12 and vitamin D  52 04/01/22 lithium  level 0.9 on 1200 mg daily  02/02/21 lithium  level 0.6 on 900 mg daily 02/22/23 lithium  0.7 on 900 mg daily.  Disc the off-label use of N-Acetylcysteine at 600 mg daily to help with mild cognitive problems.  It can be combined with a B-complex vitamin as the B-12 and folate have been shown to sometimes enhance the effect.  Partial benefit so continue NAC 1200 mg daily.  Continue vitamin D  DT history level 30  Constipation management 1.  Lots of water 2.  Powdered fiber supplement such as MiraLAX, Citrucel, etc. preferably with a meal 3.  2 stool softeners a day 4.  Milk of magnesia or magnesium tablets if needed  Plan: Reduce Seroquel  XR to 200  Try to stay as active as possible.  Please see After Visit Summary for patient specific instructions.  FU 8 weeks  Nori Beat, MD, DFAPA     Future Appointments  Date Time Provider Department Center  10/18/2023 11:00 AM Cottle, Kennedy Peabody.,  MD [facility] None  10/19/2023 12:40 PM Jacqueline Matsu, MD [facility]  02/19/2024 10:40 AM [facility]NUAL WELLNESS VISIT [facility]    No orders of the defined types were placed in this encounter.       -------------------------------

## 2023-09-15 ENCOUNTER — Telehealth: Payer: Self-pay | Admitting: Psychiatry

## 2023-09-15 NOTE — Telephone Encounter (Signed)
 LVM to [facility]

## 2023-09-15 NOTE — Telephone Encounter (Signed)
 Pt lvm 6/26 @ 8:00 pm. Pt stated he decreased to 200 mg Seroquel  last week and 6/25 had mild mania. Gone now. Should he stay with 200 mg or increase back to 300 mg. RTC [phone]   AptT 7/30

## 2023-09-18 NOTE — Telephone Encounter (Signed)
 Patient reporting he did have one day of mania on reduced dose of Seroquel  and he went back up to 300 mg and he is doing better.

## 2023-09-21 ENCOUNTER — Telehealth: Payer: Self-pay

## 2023-09-21 NOTE — Telephone Encounter (Signed)
LVM with info per DPR.  

## 2023-09-21 NOTE — Telephone Encounter (Signed)
 Take 300-400 mg HS of Seroquel  nightly.  Stay hydrated and take 1 magnesium tablet daily to prevent cramps.  We can discuss other options at his next appt if needed

## 2023-09-21 NOTE — Telephone Encounter (Signed)
 Pt had called 6/27 and left message that he had reduced Seroquel  to 200 mg. He reported an episode of mania and went back to 300 and was doing better.  Today he is reporting he feels like he is going to lose his marbles. I asked for examples and he said he didn't feel stable. He said he had a lot of 200 mg tablets and asked if he could go back to 400 mg, or if you had another suggestion. He said he had to decrease dose because his legs locked up. When asked he has no active SI, but said he wish he wasn't here. He did say that he was going to be really busy until 1:00 today and to leave him a message if I called and he didn't answer.

## 2023-10-03 ENCOUNTER — Ambulatory Visit: Admitting: Mental Health

## 2023-10-04 ENCOUNTER — Ambulatory Visit: Admitting: Mental Health

## 2023-10-04 DIAGNOSIS — F3181 Bipolar II disorder: Secondary | ICD-10-CM

## 2023-10-04 NOTE — Progress Notes (Signed)
 [facility] Counselor Initial Adult Exam  Name: Jonathon Walker Date: 10/04/2023 MRN: [id number] DOB: 20-Nov-1965 PCP: Jonathon Huxley, NP  Time spent: 50 minutes  Reason for Visit /Presenting Problem: He reports depression since age 58.  Has been in care with Dr. Geoffry for the past 10 years. Reports a history of using alcohol but has discontinued use about 4 years ago after treatment at [facility]. His parents are age 41, are supportive and are his only real supports. He moved here in 1996, his mother moved here about 10 years ago. Father is still in [place] . Parents are divorced since he was age 7. Mother remarried and that husband passed 10 years ago. He often never feels heard by his mother. He tries to realize how she is probably not going to change at this point; he thinks she also may be on the autistic spectrum. She admitted she struggles to give him support or empathy, he found this helpful as she admitted this recently. He had 2 siblings, one passed away due to accidental overdose about 10 years ago. He has another brother who lives in [place] .  Patient lives alone. Never been married. He has been on disability since 2012. History of working as Engineer, maintenance (IT).     Mental Status Exam:    Appearance:    Casual     Behavior:   Appropriate  Motor:   WNL  Speech/Language:    Clear and Coherent  Affect:   Full range   Mood:   Euthymic  Thought process:   Logical, linear, goal directed  Thought content:     WNL  Sensory/Perceptual disturbances:     none  Orientation:   x4  Attention:   Good  Concentration:   Good  Memory:   Intact  Fund of knowledge:    Consistent with age and development  Insight:     Good  Judgment:    Good  Impulse Control:   Good     Reported Symptoms:  depressed mood, hypomania, anxiety, insomnia    Risk Assessment: Danger to Self:  No Self-injurious Behavior: No Danger to Others: No Duty to Warn:no Physical Aggression / Violence:No   Access to Firearms a concern: No  Gang Involvement:No  Patient / guardian was educated about steps to take if suicide or homicide risk level increases between visits: yes While future psychiatric events cannot be accurately predicted, the patient does not currently require acute inpatient psychiatric care and does not currently meet Jonathon Walker  involuntary commitment criteria.   Medical History/Surgical History: Past Medical History:  Diagnosis Date   Alcohol abuse    Anxiety    Bimalleolar fracture of left ankle    Depression    Heart murmur    HLD (hyperlipidemia)    HTN (hypertension)    Mitral valve insufficiency    Sleep apnea    uses CPAP nightly   Tobacco use    Vitamin D  deficiency     Past Surgical History:  Procedure Laterality Date   ORIF ANKLE FRACTURE Left 09/19/2017   Procedure: OPEN REDUCTION INTERNAL FIXATION (ORIF) ANKLE FRACTURE;  Surgeon: Beverley Evalene JONETTA, MD;  Location: [facility];  Service: Orthopedics;  Laterality: Left;   TOOTH EXTRACTION      Medications: Current Outpatient Medications  Medication Sig Dispense Refill   amLODipine  (NORVASC ) 5 MG tablet Take 1 tablet (5 mg total) by mouth daily. 90 tablet 3   atorvastatin  (LIPITOR) 40 MG tablet Take 1 tablet (40 mg total)  by mouth daily. 90 tablet 3   Cholecalciferol (VITAMIN D3) 125 MCG (5000 UT) CAPS Take 5,000 Units by mouth daily in the afternoon.     lithium  carbonate 300 MG capsule Take 3 capsules (900 mg total) by mouth at bedtime. 270 capsule 0   nicotine  (NICODERM CQ  - DOSED IN MG/24 HOURS) 21 mg/24hr patch Place 1 patch (21 mg total) onto the skin daily. 28 patch 0   PARoxetine  (PAXIL ) 30 MG tablet Take 1 tablet (30 mg total) by mouth daily. 90 tablet 0   QUEtiapine  (SEROQUEL  XR) 200 MG 24 hr tablet Take 1 tablet (200 mg total) by mouth at bedtime. 90 tablet 0   No current facility-administered medications for this visit.    No Known Allergies  Diagnoses:    ICD-10-CM    1. Bipolar II disorder (HCC)  F31.81       Plan of Care: tbd   Lonni Fischer, [facility]

## 2023-10-18 ENCOUNTER — Encounter: Payer: Self-pay | Admitting: Psychiatry

## 2023-10-18 ENCOUNTER — Ambulatory Visit (INDEPENDENT_AMBULATORY_CARE_PROVIDER_SITE_OTHER): Admitting: Psychiatry

## 2023-10-18 DIAGNOSIS — G4733 Obstructive sleep apnea (adult) (pediatric): Secondary | ICD-10-CM | POA: Diagnosis not present

## 2023-10-18 DIAGNOSIS — E538 Deficiency of other specified B group vitamins: Secondary | ICD-10-CM

## 2023-10-18 DIAGNOSIS — F411 Generalized anxiety disorder: Secondary | ICD-10-CM

## 2023-10-18 DIAGNOSIS — R7989 Other specified abnormal findings of blood chemistry: Secondary | ICD-10-CM

## 2023-10-18 DIAGNOSIS — F422 Mixed obsessional thoughts and acts: Secondary | ICD-10-CM

## 2023-10-18 DIAGNOSIS — F3181 Bipolar II disorder: Secondary | ICD-10-CM | POA: Diagnosis not present

## 2023-10-18 DIAGNOSIS — F1021 Alcohol dependence, in remission: Secondary | ICD-10-CM

## 2023-10-18 DIAGNOSIS — Z79899 Other long term (current) drug therapy: Secondary | ICD-10-CM

## 2023-10-18 MED ORDER — PAROXETINE HCL 30 MG PO TABS: 30.0000 mg | ORAL_TABLET | Freq: Every day | ORAL | 0 refills | Status: DC

## 2023-10-18 MED ORDER — QUETIAPINE FUMARATE ER 400 MG PO TB24: 400.0000 mg | ORAL_TABLET | Freq: Every day | ORAL | 0 refills | Status: DC

## 2023-10-18 MED ORDER — LITHIUM CARBONATE 300 MG PO CAPS: 900.0000 mg | ORAL_CAPSULE | Freq: Every evening | ORAL | 0 refills | Status: DC

## 2023-10-18 NOTE — Progress Notes (Signed)
 Jonathon Walker [id number] [birth year] 58 y.o.  Subjective:   Patient ID:  Jonathon Walker is a 58 y.o. (DOB [birth year]) male.  Chief Complaint:  Chief Complaint  Patient presents with   Follow-up   Depression   Manic Behavior   Anxiety    Jonathon Walker presents to the office today for follow-up of anxiety , depression, alcohol.    seen August 7.  His anxiety was unmanaged at Paxil  40 mg and olanzapine  5 mg so olanzapine  was increased to 7.5 daily.  seen December 17, 2018.  His anxiety was unmanaged.  The following change was made: Trial 1-1/2 of the 7.5 mg tablets of olanzapine  for anxi.  He is had a stay at [facility] for alcohol dependence since he was last here. Stayed 28 days and DC before Thanksgiving.  Very helpful.  Was having NM nighly before and they stopped since then.  Not drinking.  Involved in AA since then.  seen March 25, 2019.  The patient was doing better requested a reduction in olanzapine  to 7.5 mg nightly to improve energy.  visit April 03, 2019.  He had remained sober and his anxiety was improved with sobriety.  He was having problems with low motivation and forgetfulness.  At the next refill it was decided olanzapine  would be reduced from 7.5 to 5 mg daily. He called requesting this urgent appointment because of worsening symptoms associated with returning to work.  seen May 21, 2019.  He was doing relatively well.  The following was noted: Tolerating Wellbutrin  and it seems to help. He didn't tolerate the increase. Anxiety is improved off alcohol.  Ok to reduce olanzapine  to 2.5 mg daily for a month and stop it.  Hopefully low motivation will improve.   He called back March 23 stating he was more depressed and having a harder time doing routine tasks and having suicidal thoughts.  Because of worsening depression after reducing the olanzapine  he was encouraged to increase olanzapine  back to 5 mg nightly. He called again March 24 stating he was  really struggling but was scheduled for an appointment on March 26.  As of June 14, 2019 he reports the following: Every day a real hard struggle to get through the days.  More anxious and depressed equally. Worse 2 weeks.  Last Saturday so overwhelmed by how he feels he had SI.  Everything is hard. Initial benefit paroxetine  has been lost.  Awakens stressed and tired. Adequate hours of sleep.  Will be major chore just to mow grass.  Still sober. SE sexual. Getting appt with CPAP doc. Changes made include: Increasing olanzapine  back to 5 mg daily a couple of days prior to the appointment due to phone call and the addition of lithium  300 mg for a few days then increase to 600 mg nightly both to augment the antidepressant and because of suicidal thoughts.  July 15, 2019 appointment, the following is noted: Doing OK with both depression and anxiety.  Kind of like a top always going in mind.  Even when I sleep, when awakens mind still going.  NM nightly for years but less than before stopped drinking.  Smaller and less severe ones that don't wake him.  Theme can't finish things.  Both depression and anxiety 3/10.  More productive.  Paces himself.  Can live with it.  Sleep 9 hours.  Initially olanzapine  stopped the ruminating but it's back some.  Overall still benefit.  No SI and can't rmember why he  had them before. Residual energy not great and some ruminating. He had some concerns about sexual side effects from Paxil  but agreed that no med changes were necessary despite residual symptoms of depression and anxiety.  08/14/2019 appointment, the following is noted:  He scheduled urgently. Vaccinated. Angry easily and exhausted and everything is a chore.  Exhausted. Never had appt with CPAP company.  CPAP is 59 years old and on same settings as in the past.  CPAP machine is not working properly but when it did it was helpful for alertness and energy.  Disc need to call them to have it evaluated. To bed  10-7.  Don't feel rested in morning but used to feel rested when first started paroxetine . Plan: Increase Lithium  continue to 900 mg daily for irritability  11/19/19 appt with the following noted: He increased to 900 mg and then reduced it but doesn't know why.  Reduced it to 300 nightly. Irritability much better.  Once anger since here with rage but not outward. Still on paroxetine  40, olanzapine  5, Wellbutrin  75 AM. Getting tired in afternoon and worrying about getting depressed but he's not depressed now.  Don't enjoy things like he used to.  Les interest and excitement. Appetite and sleep are normal.   Still adjusting without alcohol and socialization. Worrying about lack of sex drive with paroxetine . Can still ruminate on things until he gets some reassurance through talking with people. Plan: Continue low-dose lithium  300 mg daily Continue Paxil .  Did not feel well at 60 mg so we will continue 40 mg.  Likely to lose anxiety benefit if change it. Continue low-dose Wellbutrin  75  Tolerating Wellbutrin  and it seems to help. He didn't tolerate the increase.   Continue olanzapine  5 mg  02/18/2020 appointment with following noted: Open to trying increase paroxetine  again bc ruminating wears him out and gets fatigued and it wears him out though is 80% better vs before paroxetine . Doesn't remember trying higher paroxetine .  Ruminates on relationships or work.  Whenever he can talk about it with someone it helps tremendously but also realizes paroxetine  helps. Anxiety affects dreams and NM too.  Up and down a little.  Anxiety drove depression before paroxetine .  Best med he's tried. SOBER 1 YEAR SE no problem.  Except gained 25# in 4 years.  No change in diet and exercise.  No increase in appetite and no food cravings.  Seeing PCP soon. Plan: Increase paroxetine  trial to 60 for rumination.  Disc SE.  05/26/20 appt noted: No benefit increase paroxetine  nor SE.  Wants to reduce back to 30 mg  paroxetine  and stop lithium  bc it didn't seem to help.   4 days of Calm app has seemed to help rumination.  Does it in afternoon and before bed and it seemed to helps.  Doing a 10 min meditation and it helps.   Dep 4/10.  Anxiety 4/10. And a lot better the last few days.   Plan: Reduce lithium  by 1 tablet per week.  Call if there is any suicidal thought. Starting April 1 reduce paroxetine  to 40 mg daily. Continue low-dose Wellbutrin  75  Tolerating Wellbutrin  and it seems to help. He didn't tolerate the increase.   If doing OK will try taper as he suggested at next visit. Continue olanzapine  5 mg nightly it was helpful for depression and anxiety.  07/01/2020 appt with following noted: Moved appt up. Now says he understated depression level when he was here the last time. Restarted lithium  bc  felt more depressed without it. Reduced paroxetine  to 40 mg daily. Lethargic.  Mind won't calm down.  Nonrestorative sleep but 8 hours.  Doesn't think sleep is deep enough.  Tense. Goes to [employer]. Started counseling. Wants to try something different with meds. Can have mixed sx for 6 weeks with increased interest in things and motivation and want to spend money but still feels depressed.  3 times in the last year. Plan: Increase  lithium  back to 3 daily to see if depression is better and less mood cycling.  He admits to feeling worse when he stopped the lithium  Continue the reduce paroxetine  to 45 mg daily. DC Wellbutrin  Lamotrigine  trial 25 mg to 100 mg daily over 4 weeks. Continue olanzapine  5 mg nightly it was helpful for depression and anxiety.  08/26/2020 appointment with the following noted: Sad and down.  Classic depression sx and very fatigued.  Unusual to have classic depression bc usually is atypical.  No effect from lamotrigine . Wonders if atorvastatin  is causing some confusion or depression bc read about it. Depressed for 3 mos. Gained 20#.  It's leveled off now. Olanzapine  helped  the rumination and anxiety.   Life is more depressing bc not seeing friends like it was bc stopped drinking.   No SE with lithium .  Unless dropping things. Sleep 1030 to 6 but doesn't sleep deep.  Not rested in AM Plan: Increase  lithium  back to 3 daily to see if depression is better and less mood cycling.  He admits to feeling worse when he stopped the lithium  Continue the reduce paroxetine  to 40 mg daily bc it helped anxiety. Lamotrigine  increase to 150 mg daily DT no benefit or SE Increase olanzapine10 mg nightly it was helpful for depression and anxiety.  Need more help with depression   09/11/2020 phone call: Pt stated he is foegetting things throughout the day and very unsteady.He works with tools and equptment and this is a problem.He stated he takes 6 Lamictal  in the am. MD response: We made 3 med changes at the last visit including increasing lamotrigine  to 150 mg daily, increasing olanzapine  to 10 mg daily, and restarting lithium .  Any 1 of those changes could possibly cause some of the side effects.  We will have to make 1 change at a time to evaluate this.  Therefore reduce lamotrigine  to 100 mg daily.  It may take a couple of weeks before he sees a difference.  11/13/2020 phone call: Patient lm stating he is currently taking Lamotrigine  150 mg. He has decreased it to 75 mg due to side effects per pt ( blurred vision, shaking and coordination issues). Patient stated he is discontinuing before his scheduled October appointment. MD response: Take the lamotrigine  75 mg for 2 weeks and he should be able to stop then without withdrawal.  If he gets shakey, nervous then we need to reduce more slowly and let us  know that.  Otherwise he can stop if he follows these directions.  11/30/2020 phone call: Pt called and advised he would like to taper off of the Olanzapine .  He has gained 8# since his last visit and 30# total since he started taking the med.  He feels steady, not so much stress now, and  would like to see if he can take Lorazepam instead.  Then talk to Dr. Geoffry in Oct when they meet. MD response: Reduce olanzapine  to one half of the 10 mg tablet at night for 4 weeks and then stop it.  If he gets more depressed then get back in touch with us  and we will try to do something about the weight gain that olanzapine  can cause by using the alternative Lybalvi  12/07/2020 appointment with the following noted: Off lamotrigine  .  Reduced olanzapine  to 5 mg for a week.  On lithium  900 mg HS, paroxetine  40 Fatigue 10/10.  Dizziness resolved off lamotrigine .  Low fatigue and motivation.  I want to want to do things.  Stopped drinking almost 18 mos and socially big adjustment.   Still invloved with AA and daily sponsor.  Working [employer].  Haroldine Young helped. Thinks mild depression with little interest and motivation and no joy.  But not severe.  No SI. No anxiety and it's way better.  Life is more stable and helps.  Working 4 hours daily and occ extrea jobs. Uses new CPAP.  Stil drowsy and fatigue. Plan: Continue CPAP use. New CPAP machine. Trial modafinil  100-200 mg AM Lamotrigine  off DT NR Reduced olanzapine  to 5 mg daily for a week and well stop in 3 weeks DT wt gain and fatigue  01/20/2021 phone call: I called patient to see if I could get better information. He states he started modafinil  a couple of weeks ago, though Rx was written for in September. He states he didn't feel well before taking the modafinil  though. He just says he is agitated, irritable, and unstable feeling. When asked what he meant by unstable he stated feeling a shell of myself.  He has been put on the cancellation list.  MD response: It is likely the modafinil  that is causing this problem.  But it may be corrected with a dose reduction.  I assume he is taking a whole tablet.  If that is the case time to reduce to 1/2 tablet in the morning.  If he is taking half a tablet tell him to reduce it to a quarter of a tablet.   And I agree we need to try to get him in as soon as possible. He reduced modafinil  from 200 mg to 100 mg daily.  02/10/2021 appointment with the following noted: Current psych meds lithium  900 mg nightly, paroxetine  40 mg daily, olanzapine  recently stopped, modafinil  recently tried. Was really bad for awhile feeling depresssed and agitated and anxiety but better now including after reducing modafinil  to 100 mg daily. Fatigue is better and in the middle now with energy, not good but not bad. Sleep is better.  CPAP doctor yesterday and they are gathering info pending. NAC is helping with memory. Still sober and committed to it. SE constipation  Plan: No med changes  03/10/2021 phone call from patient complaining of vague feelings of not feeling well and wanting the Paxil  changed.  He was placed on the cancellation list.  03/18/2021 appointment urgently scheduled at his request: Remains on paroxetine  40 mg daily, lithium  1200 mg daily, modafinil  200 mg every morning. Ruminating  with sense of need to talk to someone.  Then feels better for awhile.  Mostly on relationships with family.  No one to talk to. Wonders need to chang ethe meds.   Don't socialize anymore since sober.  Does online AA everyday at noon. Lost 10# off olanzapine . Plan: Continue paroxetine  40 mg, lithium  1200 mg daily, Try stopping modafinil  100mg  AM to determine if it is helping or not. If not less anxious then start risperidone  for rumination 1-2 mg HS  04/05/2021 appointment with the following noted: Stopped  modafinil  and it helped  reduce anxiety. Started risperidone  2 instead of 1 mg and was off balance so reduced risperidone  to 1 mg HS. Only done this for 3 nights.  No SE with it. Anxiety is not close to as bad as originally.  Can be painful but not now. Still ruminating some my whole adult life but varies.  Anxiety can lead to depression. 6-7/10 with 10 good on depression with some problems with  ambition. Plan: Continue trial risperidone  for rumination 1 mg HS  04/07/2021 TC: Several phone calls: Called 2 days after the visit complaining of itching from the lithium  and wanting to come off of it.  He was warned he was more emotionally distressed when he reduced the lithium  in the past but he wanted to pursue it anyway.  He was continuing risperidone  1 mg nightly but wanting to reduce that also but was warned he would almost certainly have worsening mood severity and anxiety problems based on past experience.  He was cautioned against reducing lithium  below 900 mg daily and risperidone  below 1 mg nightly.  He agreed on 04/09/2021.  04/09/2021 he called again and several phone calls ensued thereafter.  He insisted on stopping lithium  and risperidone  despite warnings that his mood stability and anxiety would worsen. He was prescribed Depakote  to take the place  04/20/2021 appointment with the following noted: He stopped risperidone  and did not take the Depakote .  He is taking lithium  900 mg nightly and paroxetine  40 mg daily. Stopped risperidone  bc feeling agitated and unstable and blamed it.  Still feels the same off of it. Says itching 85% better with less lithium .  Itching for a couple of mos.  Doesn't think anxiety noticeably worse after reducing lithium  to 900 mg daily. Not dizzy.  Clumsy with hands. Anxious but not painful like it was before Paxil  but tense at the end of the day.  Initially felt normal for the first time in 20 years when first started paxil  but not as good now. Not sadness and hasn't had much of it. Started therapy with doctor on demand. Ruminating on fear of not being fully present and worries about little things like appts pending.   Afraid of working FT because gets overwhelmed bc I take it home with me Plan : Continue trial risperidone  but increase to rumination 1.5 mg HS Continue paroxetine  40 and lithium  600 mg   06/01/2021 appointment with the following  noted: Haven't seen benefit with risperidone  1.5 mg daily. Not in pain but feels disconnected and not myself.  Has felt like this off and on for years. Level of angst all day but not real high unless triggered.  Still has the rumination. Initially a lot of benefit from paroxetine  Not markedly deprssed and not sad.  07/14/21 appt noted:  Doesn't feel much different with switch to fluvoxamine  except anxiety well controlled. But still low energy, motivation and not as productive as he wants to be.  Not markedly sad.  Asks about Vraylar . Not much interest or enjoyment. Don't sleep that great and don't relax that great. On fluvoxamine  200, lithium  900, off risperidone . Plan: Anxiety controlled by fluvoxamine  200 Potentiate Vraylar  1.5 mg daily  07/27/2021 phone call complaining of ongoing depression.  Vraylar  increased to 3 mg daily. 07/29/2021 phone call complaining of cost of Vraylar .  Was given samples. 08/20/2021 phone call complaining of fatigue.  Stated he stopped the Vraylar  because it made him agitated.  It was recommended there be no further med changes because he is only been  off the Vraylar  for 1 week.  08/30/2021 appointment noted: Vraylar  1.5 mg didn't do much.  Increased to 3 mg and then felt irritable ans stopped it about 3rd week of May.  Better now. Still extremely tired and feels physically sick with normal workup. Thinks his mind wears him out.  Racing negative thoughts all the time. Asks about Ativan. Always nervous and anxious.  Trouble discerning anxiety from depression. Plan: Anxiety was controlled by fluvoxamine  200 but now he thinks it is worse. So increase 250 mg daily  10/18/21 appt noted: No results with increase fluvoxamine .  No SE. Anxiety is still worse than depression.  Thinks it causes somatic sx like the flu.  Exhausted. GI. Poor sleep and concentration. Asks about lorazepam.   Got checked out by PCP without findings. Plan: Reduce lithium  to 2 capsules daily to  see if physical symptoms like fatigue etc. are better Increase fluvoxamine  to 1 tablet in the morning and 2 tablets in the evening to reduce anxiety Plan Trial reduction of  lithium  900  to 600 mg daily to see if somatic sx are better like fatigue.   01/06/22 appt noted Everything is about the same with med changes. CC anxiety and fatigue.  Sx started with severe depression in 20s and got better but never resolved. Thinks anxiety is causing fatigue.  Has to nap about 1 pm.  Easily fatigued.  No particular worry other than how he feels and himself.. Plan: Reduce fluvoxamine  gradually to 150 mg daily Start clomipramine  25 mg nightly for 5 days and increase to 75 mg HS  02/07/22 TC:  Pt lvm that the clomiprame 25 mg has been taking the full dose for two weeks now. He is experiencing mania. Overspending etc    MD resp:  Increase the lithium  back to 3 daily.  Stop the clomipramine .  Resume the risperidone  until these symptoms clear up.  Call us  back next week and let us  know how it is going.     02/16/22 TC:   Pt was just calling to give you an update.He stated he is still 50 percent manic but feels better.He is currently taking   lithium  carbonate 300 MG 3 daily risperiDONE  3 mg 1/2 tab daily fluvoxamine  100 MG tablet 1/2 tablet in the AM and 1 tablet at night On 11/20 changes were made,you had him stop clomipramine                      and increase lithium  to 900 mg daily.  02/16/22 MD: check lithium  level.  02/24/22 tC complaining of depression. MD :  He has had recent mood swings from manic to depressed by his report.  Lithium  level is low at 0.5 on 600 mg daily.  I think the most helpful thing for his mood would be to increase lithium  back to 900 mg daily.      03/07/22 TC: pt reported increasing his Luvox  to 200 mg daily.  03/08/22 appt noted: Multiple phone calls since here.   Clomipramine  triggered mania like he's never had before.  Increased over the top looking for a house,  wanting to invent things, bought a shed and fish tank abruptly.  Left his job which was a good thing.  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  I feel a lot better about it.  Has disability and will do PT work.  I'll be OK. Exhausted and can't be on his feet all the time.  Sleep is different with EMA and ready to go but watches TV awhile and then goes back to sleep.  Always a late sleeper all his life.   Still feeling some depression and he increased the luvox  to 200 and it helped. Thinks parents are Autistic and don't respond emotionally.  M cannot understand feelings and he doesn't get any support.   Plan: Bc recent mania with clomipramine  increase lithium  1200  mg daily .    03/29/2022 appointment urgently due to recent mania: Psych meds: fluvoxamine  200, lithium  1200, risperidone  1.5 mg HS Exhausted until the last couple of days. SE tremor Has not had any water today and is lightheaded.   Mood a little better and anxiety better than it was.  No mania. Some underlying weight in mood that only responded to paroxetine  and clomipramine . Asks about Wellbutrin  for energy. Sleep normal 8-9 hours now.  Except last night. Goes to [facility] groups and support group and AA. Sober Reduce risperidone  to 1 mg to see if energy better  05/25/22 appt noted: Reduced lithium  about 10 days ago bc thought he might be getting too much bc read on the internet.  No changes since reducing it. More dep for a couple of mos which is unusual.  In bed a lot and more isolated and not normally what I have.  Not real sad but unmotivated and no energy to do things.    Start therapist Friday. No sig sadness.  Irritable and short with people over little things.  Kind of started after stopped drinking.  Worse.  Sleep on and off.  Erratic.  Looks for ward to night so can escape into sleep.   Anxious underlying for 30 years.  Hard to sit and relax and just watch TV.  Tends to ruminate. Asks to try Gaba. Now lethargy is worse  than chronic anxiety Reducing risperidone  made no changes good or bad. DC risperidone  1 mg HS Add Latuda  1/2 tablet for 1 week then 1 tablet in the the evening  07/06/22 appt :  Increased Latuda  up to 100 mg .  No clear SE. Exhausted mentally and it makes his body tired.  Working 10-2 and then comes home and lays in bed a couple of hours.  Not a deep sleep.  Never feels fully rested.  Sleep HS 10-7.   Normal appetitie.   Still some sadness and irritability.  Anxiety doesn't seem better but is having less mood swings and mania.  Anxiety wears him down.   Plan: Increase  Latuda  120 mg in the evening with food  07/21/22 appt noted: Trouble keeping mood chart.  If I talk and I'm heard then feels better for hours but then damn stopped up.  Almost like an obsessive thing to talk for relief. No clear effect of increase Latuda  except reduced som cognitive anxiety but not physical anxiety. Says there was med he took before paxil  that gave him instant relief and wonders about retrying it.  Doesn't know which med. Paxil  had best effect when it worked for a period of time. Exhausted but fidget when sits.   Dep and anxious moderate.  09/01/22 appt noted: Meds clonazepam  0.5 mg BID prn, fluvoxamine  200 mg daily, lithium  900 HS, latuda  120 mg daily, vit D3 This type of dep since November after mania is low energy, anhedonia, low appetite, a lot of time in bed, textbook depresssion, lack of interest but not debilitating sadness.   Tired of it.  Dep is worse than anxiety  and used to be the other way around.  Not as irritable. In past dep was anxious and disconnected and wasn't as tired.  Had to leave work bc can't get himself out of bed.   Toss and turn in bed fidgety in bed, feels like has to move but no energy to walk.  Fidgety for 3 mos. Clonazepam  takes the edge off but feels a higher dose is needed.  Plan: Changes: Reduce Latuda  to 60 mg daily. Start samples of Auvelity  1 in the AM for dep that is  TRD  10/20/22 appt noted: Meds clonazepam  0.5 mg BID prn, fluvoxamine  200 mg daily, lithium  900 HS, latuda  60 mg daily, vit D3, out of Auvelity  after 2 weeks. Less restless but still has it 6/10 after reduced Latuda .  Thinks he had it before eLatuda but not sure. Takes an hour to get to sleep.   No change in dep from this visit to last visit. Did have positive effects from Auvelity  but ? SE conc.  Did help energy Lately more dep than anxiety.  Noted ASA helps anxiety. Couple times situational anger but no mood swings. Plan: Changes: Reduce fluvoxamine  to 1 and 1/2 tablet daily to see if restlessness better Reduce Latuda  from 60 to 40 mg daily to see if restlessness better Retry Auvelity  1 in the AM for 7 days then 1 twice daily  11/14/22 appt noted: Twitching and restlessness better.   Still some teeth grinding but better Auvelity  didn't help mood.  Ongoing classic lethargy, poor appetite.  Can't exercise bc depression.  Little tasks overwhelming.    clonazepam  0.5 mg BID prn, fluvoxamine  150 mg daily, lithium  900 HS, latuda  40 mg daily, vit D3,  Auvelity  BID SE constipation and bruxism. Sleep fair and best and most relaxed 730-930 AM.  To bed 10 pm.   12/26/22 appt noted: More relaxed if not underpressure but not working and needs to. Not much motivation or energy in AM.  Doesn't notice the effects of Seroquel  for many hours, but it is relaxing med. For last 7 years hard time with motivation and capacity.   Happened years ago and then it resolved after 7 years with Wellbutrin  Plan: Changes none:  Continue 1/2 quetiapine  XR 150 mg PM.  This is helping him to feel more calm in the morning than he has felt in a very long time.  However he thinks now he is a little too subdued but does not want to do reduce the dose any further.  Does not tolerate a higher dose because it is too sedating.  01/09/23 TC:  Patient taking 75 mg of Seroquel  and sleeping great at night, but also sleeping a lot  during the day. He reports he doesn't want to be around people, is content to be at home not doing anything. He rates depression as 7/10, anxiety 5/10. Reports anxiety is better since starting Seroquel . He also takes: Fluvoxamine  150 mg at bedtime Lithium  900 mg at bedtime, though 1200 is prescribed. No longer taking clonazepam .    MD resp:  There are a limited number of options left to us  and it is important that each med be tried at his most effective dose.  Seroquel  is FDA approved for depression in doses of 150 mg and higher.  The low dose he is currently on is enough to help anxiety and his sleep but probably not enough to help depression.  We may need to go as high as 200 or 300 mg a day.  He is probably fearful of being too sedated at higher doses but this medicine does not necessarily make people more sleepy at higher doses.  I think she got to go up to the full quetiapine  XR 150 mg daily and give that a try until his appointment with me.      02/08/23 appt noted: Psych med: no Clonazepam  0.5 mg twice daily as needed anxiety, fluvoxamine  150 nightly, lithium  900 nightly, quetiapine  XR 150 at 3 pm Less am drowsiness taking Seroquel  in the afternoon. Chronic fatigue unchanged.  Rare days without it.  Can do one task daily.  Can work 1 and 1/2 hours daily.  Naps about 2 pm.   Seroquel  helps sleep and anxiety.   Easily overwhelmed if multiple tasks.   Feels much better in the AM than in years.  By noon is worn out.   Plan: Increase for TRD quetiapine  XR 200 mg PM.   03/23/23 appt noted: Meds as above except incr Seroquel  XR 200 mg daily. Anxiety better but still some dep with less motivation maybe 20% better. No sig SE except dryness. URI for 8 days. Sleep 10 hours per usual.   Much more relaxed with Seroquel . Plan: Increase further for TRD quetiapine  XR 300 mg PM.   05/02/23 appt noted: Multiple phone calls since here complaining of ongoing sx and med rxns. Incr and decrease  fluvoxamine  to 150 now 100 complaining of no benefit for anxiety at the higher dose and feeling somewhat agitated.   Current meds:.  Fluvoxamine  100 nightly, lithium  900 nightly, quetiapine  ER 300 mg nightly. Felt annoyed, angry , agitated without reason and called.   Is a little better the last 10 days.  Not nearly as angry SE wt gain and constipation. Asks about switch to paxil  bc helped the most of anything.   Speaking to counselor.  Fearful childhood. Plan: Increase quetiapine  ER  to  200 mg 2 tablets in evening for 3 weeks then 3 tablets in the evening.   06/15/23 appt noted: Med: Seroquel  XR 600, lithium  900, fluvoxamine  150. Vit D 5000 helped Overall is a little better.  Less foggy.  Less dep. 30-40% better with combo. SE gained another 5# Vit D helped fatigue.  Still naps but less. Sleep is OK. Less angry but still has shortness.  But not as bad. Plan continue above longer  07/27/23 appt noted: Med as above 50-60% of himself.  Mind wears him out and then has to take a nap.  It's been a lot worse than this before but would like change meds to get better back to paroxetine .   No changes with increase Seroquel . Willing to come back to this if sx are not better with paroxetine .   Physical weakness is rough.  But doesn't work so doesn't have to do much .  Last year could work 4 hours daily but not now.  Get overwhelmed really quickly with little matters.  Gets irritable. Plan: Plan: Reduce fluvoxamine  to 1/2 tablet and add 1/2 paroxetine  for 5 days, Then stop fluvoxamine  and increase paroxetine  to 1 tablet daily.    08/16/23 TC:  Pt reported he was having bad leg cramps from his hips to his feet. He said he googled and read that taking Seroquel  and Paxil  could give him too much dopamine. He decreased Seroquel  from 600 mg to 300 mg and reports after 2 days the cramps went away, he is more relaxed, less agitated, and is sleeping well. He is taking: Paxil  30  Lithium  900 Seroquel  300  He  doesn't need/isn't asking for anything, just wanted to make you aware.        6+/18/25 appt noted:  Med:  Seroquel  XR 300 pm, paroxetine  30, no fluvoxamine  , lithium  900 HS.  Helped leg cramps with less Seroquel  XR and better mental clarity.   Paxil  helped anxiety tremendously.   Still dealing with depression.  Exhausted by 1 pm and naps.   Sleep regular hours but not restful.  Moderate helpful.  9 hours.  CPAP. SE no sig other than constipation and hard to lose wt. But not gaining. Not enough fluids and gets lightheaded.   Plan: Plan: Reduce Seroquel  XR to 200  09/15/23 TC:   10/18/23 appt noted:  Med:  Pt lvm 6/26 @ 8:00 pm. Pt stated he decreased to 200 mg Seroquel  last week and 6/25 had mild mania. Gone now. Should he stay with 200 mg or increase back to 300 mg.    resp:  Patient reporting he did have one day of mania on reduced dose of Seroquel  and he went back up to 300 mg and he is doing better.     09/21/23 TC:  Pt had called 6/27 and left message that he had reduced Seroquel  to 200 mg. He reported an episode of mania and went back to 300 and was doing better.  Today he is reporting he feels like he is going to lose his marbles. I asked for examples and he said he didn't feel stable. He said he had a lot of 200 mg tablets and asked if he could go back to 400 mg, or if you had another suggestion. He said he had to decrease dose because his legs locked up. When asked he has no active SI, but said he wish he wasn't here. He did say that he was going to be really busy until 1:00 today and to leave him a message if I called and he didn't answer.     MD resp:  Take 300-400 mg HS of Seroquel  nightly.  Stay hydrated and take 1 magnesium tablet daily to prevent cramps.  We can discuss other options at his next appt if needed    Lorene Macintosh, MD, [facility]  10/18/23 appt noted:  Med:  Seroquel  XR 400 pm, paroxetine  30,  lithium  900 HS.  Better with 400 mg vs lower, resolved hypomania with  buying.  Not wasteful buying.   2 brothers on Seroquel .  One of them on disability with OCD but passed away. Paxil  very helpful.  More at peace. Get some tiredness in afternoon and has to nap 90 mins.  Done this for years. Normal sleep at night, but maybe not as refreshed, like something is on my mind for the last 6 mos.  Feels like he is half awake at night.  It is bearable.  But asks about Ambien. Mood and anxiety better than a long time.    On disability for 7-8 years.  Working PT Before treatment did not sleep and RX Ambien and Wellbutrin  [facility] for alcohol.  Sober 2 year 01/2019  Past psych meds:   Paxil  60 too much + wt gain,   duloxetine, Zoloft,  Viibryd 60 partial resp (trial after paroxetine ) SE diarrhea. Trintellix 10 agitated Wellbutrin  75 (max tolerated),   Fluvoxamine  300 no better than 200 which seems to take edge off anxiety Clomipramine  mania Auvelity  4 weeks NR  Abilify, Rexulti 2. Vraylar  irritable at 3 mg daily.  Latuda  120 fidgety Olanzapine  10 fatigue Risperdal  2 once Seroquel  XR 600 benefit partial; 200 daily lost response  Buspar 30 BID stopped DT partial effect.  Xanax craving,   lithium   900, worse when he stopped lithium  Lamotrigine  NR CO dizzy  NAC 600 helped Modafinil  200 agitated Took lorazepam  in the past, Xanax less helpful Ambien worked.    B severe OCD obsessions psych tx.  Review of Systems:  Review of Systems  Constitutional:  Positive for fatigue. Negative for unexpected weight change.  Cardiovascular:  Negative for palpitations.  Gastrointestinal:  Positive for constipation. Negative for nausea.  Neurological:  Negative for tremors.  Psychiatric/Behavioral:  Positive for dysphoric mood. Negative for agitation, behavioral problems, confusion, decreased concentration, hallucinations, self-injury, sleep disturbance and suicidal ideas. The patient is nervous/anxious. The patient is not hyperactive.     Medications: I  have reviewed the patient's current medications.  Current Outpatient Medications  Medication Sig Dispense Refill   amLODipine  (NORVASC ) 5 MG tablet Take 1 tablet (5 mg total) by mouth daily. 90 tablet 3   atorvastatin  (LIPITOR) 40 MG tablet Take 1 tablet (40 mg total) by mouth daily. 90 tablet 3   Cholecalciferol (VITAMIN D3) 125 MCG (5000 UT) CAPS Take 5,000 Units by mouth daily in the afternoon.     lithium  carbonate 300 MG capsule Take 3 capsules (900 mg total) by mouth at bedtime. 270 capsule 0   nicotine  (NICODERM CQ  - DOSED IN MG/24 HOURS) 21 mg/24hr patch Place 1 patch (21 mg total) onto the skin daily. 28 patch 0   PARoxetine  (PAXIL ) 30 MG tablet Take 1 tablet (30 mg total) by mouth daily. 90 tablet 0   QUEtiapine  (SEROQUEL  XR) 400 MG 24 hr tablet Take 1 tablet (400 mg total) by mouth at bedtime. 90 tablet 0   No current facility-administered medications for this visit.    Medication Side Effects: sexual, weight  Allergies: No Known Allergies  Past Medical History:  Diagnosis Date   Alcohol abuse    Anxiety    Bimalleolar fracture of left ankle    Depression    Heart murmur    HLD (hyperlipidemia)    HTN (hypertension)    Mitral valve insufficiency    Sleep apnea    uses CPAP nightly   Tobacco use    Vitamin D  deficiency     Family History  Problem Relation Age of Onset   Cancer Maternal Grandfather    Colon cancer Neg Hx    Colon polyps Neg Hx    Esophageal cancer Neg Hx    Rectal cancer Neg Hx    Stomach cancer Neg Hx     Social History   Socioeconomic History   Marital status: Single    Spouse name: Not on file   Number of children: Not on file   Years of education: Not on file   Highest education level: Some college, no degree  Occupational History   Not on file  Tobacco Use   Smoking status: Every Day    Current packs/day: 1.50    Average packs/day: 1.5 packs/day for 48.6 years (72.9 ttl pk-yrs)    Types: Cigarettes    Start date: 03/22/1975    Smokeless tobacco: Never   Tobacco comments:    He reports he has quit 3 times and recently started back in 2012. Hsm   Vaping Use   Vaping status: Never Used  Substance and Sexual Activity   Alcohol use: Not Currently   Drug use:  No   Sexual activity: Not on file  Other Topics Concern   Not on file  Social History Narrative   Not on file   Social Drivers of Health   Financial Resource Strain: Low Risk  (04/25/2023)   Overall Financial Resource Strain (CARDIA)    Difficulty of Paying Living Expenses: Not very hard  Recent Concern: Financial Resource Strain - Medium Risk (02/14/2023)   Overall Financial Resource Strain (CARDIA)    Difficulty of Paying Living Expenses: Somewhat hard  Food Insecurity: No Food Insecurity (04/25/2023)   Hunger Vital Sign    Worried About Running Out of Food in the Last Year: Never true    Ran Out of Food in the Last Year: Never true  Transportation Needs: No Transportation Needs (04/25/2023)   PRAPARE - Administrator, Civil Service (Medical): No    Lack of Transportation (Non-Medical): No  Physical Activity: Inactive (04/25/2023)   Exercise Vital Sign    Days of Exercise per Week: 0 days    Minutes of Exercise per Session: 10 min  Stress: Stress Concern Present (04/25/2023)   [employer] of Occupational Health - Occupational Stress Questionnaire    Feeling of Stress : Very much  Social Connections: Moderately Isolated (04/25/2023)   Social Connection and Isolation Panel    Frequency of Communication with Friends and Family: Three times a week    Frequency of Social Gatherings with Friends and Family: Once a week    Attends Religious Services: Never    Database administrator or Organizations: Yes    Attends Engineer, structural: More than 4 times per year    Marital Status: Never married  Intimate Partner Violence: Not At Risk (02/15/2023)   Humiliation, Afraid, Rape, and Kick questionnaire    Fear of Current or Ex-Partner: No     Emotionally Abused: No    Physically Abused: No    Sexually Abused: No    Past Medical History, Surgical history, Social history, and Family history were reviewed and updated as appropriate.   Please see review of systems for further details on the patient's review from today.   Objective:   Physical Exam:  There were no vitals taken for this visit.  Physical Exam Constitutional:      General: He is not in acute distress.    Appearance: Normal appearance. He is well-developed.     Comments: Red face  Musculoskeletal:        General: No deformity.  Neurological:     Mental Status: He is alert and oriented to person, place, and time.     Motor: No tremor.     Coordination: Coordination normal.     Gait: Gait normal.  Psychiatric:        Attention and Perception: Attention normal. He is attentive.        Mood and Affect: Mood is anxious and depressed. Affect is not labile, blunt, angry or inappropriate.        Speech: Speech normal.        Behavior: Behavior normal. Behavior is not agitated or slowed.        Thought Content: Thought content normal. Thought content is not delusional. Thought content does not include homicidal or suicidal ideation. Thought content does not include suicidal plan.        Cognition and Memory: Cognition normal.        Judgment: Judgment normal.     Comments: Insight  Fair. Continued mood problems  anxiety 3/10.   Dep improved 4/10       Lab Review:     Component Value Date/Time   NA 142 05/26/2023 1113   K 4.3 05/26/2023 1113   CL 107 05/26/2023 1113   CO2 26 05/26/2023 1113   GLUCOSE 106 (H) 05/26/2023 1113   BUN 23 05/26/2023 1113   CREATININE 1.22 05/26/2023 1113   CREATININE 1.01 03/01/2022 0904   CALCIUM  9.5 05/26/2023 1113   PROT 7.0 05/26/2023 1113   ALBUMIN 4.4 05/26/2023 1113   AST 18 05/26/2023 1113   ALT 39 05/26/2023 1113   ALKPHOS 97 05/26/2023 1113   BILITOT 0.4 05/26/2023 1113       Component Value Date/Time    WBC 6.4 05/26/2023 1113   RBC 4.81 05/26/2023 1113   HGB 15.2 05/26/2023 1113   HGB 15.2 05/05/2010 1049   HCT 43.8 05/26/2023 1113   HCT 44.8 05/05/2010 1049   PLT 197.0 05/26/2023 1113   PLT 193 05/05/2010 1049   MCV 91.0 05/26/2023 1113   MCV 89.6 05/05/2010 1049   MCH 30.4 05/05/2010 1049   MCHC 34.7 05/26/2023 1113   RDW 13.5 05/26/2023 1113   RDW 13.1 05/05/2010 1049   LYMPHSABS 2.5 07/21/2022 1143   LYMPHSABS 1.8 05/05/2010 1049   MONOABS 0.8 07/21/2022 1143   MONOABS 0.4 05/05/2010 1049   EOSABS 0.6 07/21/2022 1143   EOSABS 0.2 05/05/2010 1049   BASOSABS 0.1 07/21/2022 1143   BASOSABS 0.0 05/05/2010 1049    Lithium  Lvl  Date Value Ref Range Status  02/22/2023 0.7 0.6 - 1.2 mmol/L Final  02/22/23 lithium  0.7 on 900 mg daily. 04/01/22 lithium  level 0.9 on 1200 mg daily  02/02/21 lithium  level 0.6 on 900 mg daily  05/2023 vit D 30 started 5000 units helping fatigue  No results found for: PHENYTOIN, PHENOBARB, VALPROATE, CBMZ   .res Assessment: Plan:     [place] was seen today for follow-up, depression, manic behavior and anxiety.  Diagnoses and all orders for this visit:  Bipolar II disorder (HCC) -     Lithium  level -     QUEtiapine  (SEROQUEL  XR) 400 MG 24 hr tablet; Take 1 tablet (400 mg total) by mouth at bedtime. -     lithium  carbonate 300 MG capsule; Take 3 capsules (900 mg total) by mouth at bedtime.  GAD (generalized anxiety disorder) -     PARoxetine  (PAXIL ) 30 MG tablet; Take 1 tablet (30 mg total) by mouth daily.  Mixed obsessional thoughts and acts -     PARoxetine  (PAXIL ) 30 MG tablet; Take 1 tablet (30 mg total) by mouth daily.  OSA (obstructive sleep apnea)  Lithium  use  Low vitamin D  level  Low serum vitamin B12  Alcohol dependence in remission (HCC)    Chronic recurrent rumination with anxiety and urgency and sense of need and instablity with impatience ongoing with frequent phone calls between appts. but  but anhedonic  depression and anxious.  Needs a lot of reassurance. Chronic worry and overwhelmed easily and misattributes sx as SE of meds leading to inadequate med trials.  Fear of meds.Falsely attributes his psych sx to the meds and then stops meds AMA.  False attribution of sx as SE.  But overall this is less of a problem than it was.  he describe history other manic cycling symptoms including increased interest, increased goal-directed behaviors, increased urge to spend that will last for 6 weeks or so and then stop.  He has these cycles about  3 times a year.  This is suggestive of a bipolar predisposition which was confirmed by recent cycle into mania from clomipramine .  Explained bipolar disorder underlying as reason never responded consistently to AD alone.  Anhedonic dep and anxiety better with Seroquel  400 and paroxetine  30 added.  Option pramipexole but difficult to use with quetiapine .   Continue CPAP use. New CPAP machine.  Discussed potential metabolic side effects associated with atypical antipsychotics, as well as potential risk for movement side effects. Advised pt to contact office if movement side effects occur.  He agrees.  Call if you have any problems with this. Failed attemp to reduce Seroquel  below 400 mg daily. Consider alternative Caplyta.    He remains sober.    Bc late 2023 mania with clomipramine  increased lithium  1200  mg daily .   He cut it back to 900 mg daily.  He admits to feeling worse when he stopped the lithium .  Call if sx get worse. Counseled patient regarding potential benefits, risks, and side effects of lithium  to include potential risk of lithium  affecting thyroid  and renal function.  Discussed need for periodic lab monitoring to determine drug level and to assess for potential adverse effects.  Counseled patient regarding signs and symptoms of lithium  toxicity and advised that they notify office immediately or seek urgent medical attention if experiencing these signs  and symptoms.  Patient advised to contact office with any questions or concerns. 02/02/21  02/02/21 lithium  level 0.6 on 900 mg daily, normal B12 and vitamin D  52 04/01/22 lithium  level 0.9 on 1200 mg daily  02/02/21 lithium  level 0.6 on 900 mg daily 02/22/23 lithium  0.7 on 900 mg daily.  Disc the off-label use of N-Acetylcysteine at 600 mg daily to help with mild cognitive problems.  It can be combined with a B-complex vitamin as the B-12 and folate have been shown to sometimes enhance the effect.  Partial benefit so continue NAC 1200 mg daily.  Continue vitamin D  DT history level 30  Constipation management 1.  Lots of water 2.  Powdered fiber supplement such as MiraLAX, Citrucel, etc. preferably with a meal 3.  2 stool softeners a day 4.  Milk of magnesia or magnesium tablets if needed  Plan: continue Seroquel  XR 400, paroxetine  30, lithium  900.  No changes today But check lithium  level.  Try to stay as active as possible.  Please see After Visit Summary for patient specific instructions.  FU 8 weeks  Lorene Macintosh, MD, DFAPA     Future Appointments  Date Time Provider Department Center  10/19/2023 12:40 PM Shlomo Wilbert SAUNDERS, MD [facility]  11/01/2023 11:00 AM Bernardo Bruckner, [facility] None  12/06/2023 11:00 AM Cottle, Lorene KANDICE Raddle., MD [facility] None  02/19/2024 10:40 AM [facility]AL WELLNESS VISIT [facility]    Orders Placed This Encounter  Procedures   Lithium  level        -------------------------------

## 2023-10-19 ENCOUNTER — Ambulatory Visit: Attending: Cardiology | Admitting: Cardiology

## 2023-10-19 VITALS — BP 122/73 | HR 77 | Ht 75.0 in | Wt 284.4 lb

## 2023-10-19 DIAGNOSIS — I340 Nonrheumatic mitral (valve) insufficiency: Secondary | ICD-10-CM

## 2023-10-19 DIAGNOSIS — I517 Cardiomegaly: Secondary | ICD-10-CM | POA: Diagnosis not present

## 2023-10-19 DIAGNOSIS — I272 Pulmonary hypertension, unspecified: Secondary | ICD-10-CM

## 2023-10-19 NOTE — Patient Instructions (Signed)
 Medication Instructions:  Your physician recommends that you continue on your current medications as directed. Please refer to the Current Medication list given to you today.  *If you need a refill on your cardiac medications before your next appointment, please call your pharmacy*  Lab Work: None.  If you have labs (blood work) drawn today and your tests are completely normal, you will receive your results only by: MyChart Message (if you have MyChart) OR A paper copy in the mail If you have any lab test that is abnormal or we need to change your treatment, we will call you to review the results.  Testing/Procedures: Your physician has requested that you have an echocardiogram. Echocardiography is a painless test that uses sound waves to create images of your heart. It provides your doctor with information about the size and shape of your heart and how well your heart's chambers and valves are working. This procedure takes approximately one hour. There are no restrictions for this procedure. Please do NOT wear cologne, perfume, aftershave, or lotions (deodorant is allowed). Please arrive 15 minutes prior to your appointment time.  Please note: We ask at that you not bring children with you during ultrasound (echo/ vascular) testing. Due to room size and safety concerns, children are not allowed in the ultrasound rooms during exams. Our front office staff cannot provide observation of children in our lobby area while testing is being conducted. An adult accompanying a patient to their appointment will only be allowed in the ultrasound room at the discretion of the ultrasound technician under special circumstances. We apologize for any inconvenience.   Follow-Up: At [facility], you and your health needs are our priority.  As part of our continuing mission to provide you with exceptional heart care, our providers are all part of one team.  This team includes your primary Cardiologist  (physician) and Advanced Practice Providers or APPs (Physician Assistants and Nurse Practitioners) who all work together to provide you with the care you need, when you need it.  Your next appointment:   1 year(s)  Provider:   Dr. Wilbert Bihari, MD

## 2023-10-19 NOTE — Addendum Note (Signed)
 Addended by: JANIT GENI CROME on: 10/19/2023 01:38 PM   Modules accepted: Orders

## 2023-10-19 NOTE — Progress Notes (Signed)
 Cardiology CONSULT Note    Date:  10/19/2023   ID:  KARVER FADDEN, DOB 1965-10-23, MRN [id number]  PCP:  Merna Huxley, NP  Cardiologist:  Wilbert Bihari, MD   Chief Complaint  Patient presents with   New Patient (Initial Visit)    Mitral regurgitation    Patient Profile: Jonathon Walker is a 58 y.o. male who is being seen today for the evaluation of mitral regurgitation at the request of Merna Huxley, NP.  History of Present Illness:  Jonathon Walker is a 58 y.o. male who is being seen today for the evaluation of mitral regurgitation at the request of Merna Huxley, NP.  This is a 58 year old male with a history of alcohol abuse, anxiety, depression, hyperlipidemia, hypertension, obstructive sleep apnea on CPAP and mitral regurgitation.  He had a 2D echo in 2021 showing EF 60 to 65% with severe asymmetric septal hypertrophy, mild pulmonary hypertension, mild to moderate MR.  He has not seen a cardiologist is now referred to establish cardiac care.  He denies any chest pain or pressure, shortness of breath, DOE, PND, orthopnea, lower extremity edema,  palpitations or syncope.  He does have problems some when it gets hot and and he gets dehydrated and then gets dizzy.  He is still using his CPAP and is doing well with it.  He has not been feeling rested in the am but attributes it to his dx of major depression.  He has problems falling asleep.  His psychiatrist recommended melatonin.   Past Medical History:  Diagnosis Date   Alcohol abuse    Anxiety    Bimalleolar fracture of left ankle    Depression    Heart murmur    HLD (hyperlipidemia)    HTN (hypertension)    Mitral valve insufficiency    Sleep apnea    uses CPAP nightly   Tobacco use    Vitamin D  deficiency     Past Surgical History:  Procedure Laterality Date   ORIF ANKLE FRACTURE Left 09/19/2017   Procedure: OPEN REDUCTION INTERNAL FIXATION (ORIF) ANKLE FRACTURE;  Surgeon: Beverley Evalene JONETTA, MD;  Location:  [facility];  Service: Orthopedics;  Laterality: Left;   TOOTH EXTRACTION      Current Medications: Current Meds  Medication Sig   amLODipine  (NORVASC ) 5 MG tablet Take 1 tablet (5 mg total) by mouth daily.   atorvastatin  (LIPITOR) 40 MG tablet Take 1 tablet (40 mg total) by mouth daily.   Cholecalciferol (VITAMIN D3) 125 MCG (5000 UT) CAPS Take 5,000 Units by mouth daily in the afternoon.   lithium  carbonate 300 MG capsule Take 3 capsules (900 mg total) by mouth at bedtime.   PARoxetine  (PAXIL ) 30 MG tablet Take 1 tablet (30 mg total) by mouth daily.   QUEtiapine  (SEROQUEL  XR) 400 MG 24 hr tablet Take 1 tablet (400 mg total) by mouth at bedtime.    Allergies:   Patient has no known allergies.   Social History   Socioeconomic History   Marital status: Single    Spouse name: Not on file   Number of children: Not on file   Years of education: Not on file   Highest education level: Some college, no degree  Occupational History   Not on file  Tobacco Use   Smoking status: Every Day    Current packs/day: 1.50    Average packs/day: 1.5 packs/day for 48.6 years (72.9 ttl pk-yrs)    Types: Cigarettes  Start date: 03/22/1975   Smokeless tobacco: Never   Tobacco comments:    He reports he has quit 3 times and recently started back in 2012. Hsm   Vaping Use   Vaping status: Never Used  Substance and Sexual Activity   Alcohol use: Not Currently   Drug use: No   Sexual activity: Not on file  Other Topics Concern   Not on file  Social History Narrative   Not on file   Social Drivers of Health   Financial Resource Strain: Low Risk  (04/25/2023)   Overall Financial Resource Strain (CARDIA)    Difficulty of Paying Living Expenses: Not very hard  Recent Concern: Financial Resource Strain - Medium Risk (02/14/2023)   Overall Financial Resource Strain (CARDIA)    Difficulty of Paying Living Expenses: Somewhat hard  Food Insecurity: No Food Insecurity (04/25/2023)    Hunger Vital Sign    Worried About Running Out of Food in the Last Year: Never true    Ran Out of Food in the Last Year: Never true  Transportation Needs: No Transportation Needs (04/25/2023)   PRAPARE - Administrator, Civil Service (Medical): No    Lack of Transportation (Non-Medical): No  Physical Activity: Inactive (04/25/2023)   Exercise Vital Sign    Days of Exercise per Week: 0 days    Minutes of Exercise per Session: 10 min  Stress: Stress Concern Present (04/25/2023)   [employer] of Occupational Health - Occupational Stress Questionnaire    Feeling of Stress : Very much  Social Connections: Moderately Isolated (04/25/2023)   Social Connection and Isolation Panel    Frequency of Communication with Friends and Family: Three times a week    Frequency of Social Gatherings with Friends and Family: Once a week    Attends Religious Services: Never    Database administrator or Organizations: Yes    Attends Engineer, structural: More than 4 times per year    Marital Status: Never married     Family History:  The patient's family history includes Cancer in his maternal grandfather.   ROS:   Please see the history of present illness.    ROS All other systems reviewed and are negative.     10/17/2023    3:42 PM  PAD Screen  Previous PAD dx? No  Previous surgical procedure? Yes  Dates of procedures 10-02-2017  Pain with walking? No  Subsides with rest? Yes  Feet/toe relief with dangling? No  Painful, non-healing ulcers? No  Extremities discolored? No       PHYSICAL EXAM:   VS:  BP 122/73 (BP Location: Right Arm, Patient Position: Sitting)   Pulse 77   Ht 6' 3 (1.905 m)   Wt 284 lb 6.4 oz (129 kg)   SpO2 96%   BMI 35.55 kg/m    GEN: Well nourished, well developed, in no acute distress  HEENT: normal  Neck: no JVD, carotid bruits, or masses Cardiac: RRR; no rubs, or gallops,no edema.  Intact distal pulses bilaterally. 2/6 harsh holosystolic murmur  at the LLSB>>apex Respiratory:  clear to auscultation bilaterally, normal work of breathing GI: soft, nontender, nondistended, + BS MS: no deformity or atrophy  Skin: warm and dry, no rash Neuro:  Alert and Oriented x 3, Strength and sensation are intact Psych: euthymic mood, full affect  Wt Readings from Last 3 Encounters:  10/19/23 284 lb 6.4 oz (129 kg)  05/26/23 281 lb (127.5 kg)  02/15/23 267 lb (  121.1 kg)      Studies/Labs Reviewed:          Recent Labs: 05/26/2023: ALT 39; BUN 23; Creatinine, Ser 1.22; Hemoglobin 15.2; Platelets 197.0; Potassium 4.3; Sodium 142; TSH 2.17   Lipid Panel    Component Value Date/Time   CHOL 137 05/26/2023 1113   TRIG 116.0 05/26/2023 1113   HDL 34.90 (L) 05/26/2023 1113   CHOLHDL 4 05/26/2023 1113   VLDL 23.2 05/26/2023 1113   LDLCALC 79 05/26/2023 1113   LDLDIRECT 204.0 05/31/2018 1100    ds that were reviewed today include:  2D echo from 2021 EKG Interpretation Date/Time:  Thursday October 19 2023 13:08:43 EDT Ventricular Rate:  89 PR Interval:  176 QRS Duration:  84 QT Interval:  374 QTC Calculation: 455 R Axis:   2  Text Interpretation: Normal sinus rhythm Cannot rule out Anterior infarct , age undetermined No previous ECGs available Confirmed by Shlomo Corning ([id number]) on 10/19/2023 1:26:39 PM     ASSESSMENT:    1. Mitral valve insufficiency, unspecified etiology   2. Pulmonary hypertension, unspecified (HCC)   3. Asymmetric septal hypertrophy      PLAN:  In order of problems listed above:  #Mitral regurgitation - 2D echo in 2021 showed mild to moderate MR - Will repeat echo to make sure this is not progressed  #Pulmonary hypertension - Very mild elevated PA pressures on by echo 2021 - Repeat echo to reassess PA pressures  #Severe asymmetric septal hypertrophy - This was noted on echo 2021 - No other mention of findings consistent with HOCM such as SAM or LVOT gradient - Will repeat echo and if there is any  worry for HOCM we will get a cardiac MRI  #OSA -he uses CPAP nightly -I will try to get a download on his device -DME is [facility]  Time Spent: 20 minutes total time of encounter, including 15 minutes spent in face-to-face patient care on the date of this encounter. This time includes coordination of care and counseling regarding above mentioned problem list. Remainder of non-face-to-face time involved reviewing chart documents/testing relevant to the patient encounter and documentation in the medical record. I have independently reviewed documentation from referring provider  Followup: 1 year  Medication Adjustments/Labs and Tests Ordered: Current medicines are reviewed at length with the patient today.  Concerns regarding medicines are outlined above.  Medication changes, Labs and Tests ordered today are listed in the Patient Instructions below.  There are no Patient Instructions on file for this visit.   Signed, Corning Shlomo, MD  10/19/2023 1:13 PM    [facility] [address] [place], [place], [place]  [address] Phone: [phone]; Fax: [phone]

## 2023-11-01 ENCOUNTER — Ambulatory Visit: Admitting: Mental Health

## 2023-11-01 DIAGNOSIS — F3181 Bipolar II disorder: Secondary | ICD-10-CM

## 2023-11-01 NOTE — Progress Notes (Signed)
 [facility] psychotherapy note  Name: Jonathon Walker Date: 11/01/2023 MRN: [id number] DOB: 14-Mar-1966 PCP: Merna Huxley, NP  Time spent: 51 minutes  Treatment: Individual therapy  Mental Status Exam:    Appearance:    Casual     Behavior:   Appropriate  Motor:   WNL  Speech/Language:    Clear and Coherent  Affect:   Full range   Mood:   Euthymic  Thought process:   Logical, linear, goal directed  Thought content:     WNL  Sensory/Perceptual disturbances:     none  Orientation:   x4  Attention:   Good  Concentration:   Good  Memory:   Intact  Fund of knowledge:    Consistent with age and development  Insight:     Good  Judgment:    Good  Impulse Control:   Good     Reported Symptoms:  depressed mood, hypomania, anxiety, insomnia    Risk Assessment: Danger to Self:  No Self-injurious Behavior: No Danger to Others: No Duty to Warn:no Physical Aggression / Violence:No  Access to Firearms a concern: No  Gang Involvement:No  Patient / guardian was educated about steps to take if suicide or homicide risk level increases between visits: yes While future psychiatric events cannot be accurately predicted, the patient does not currently require acute inpatient psychiatric care and does not currently meet Laie  involuntary commitment criteria.  ADULT PSYCHOSOCIAL ASSESSMENT Part II Abuse History: Victim - none  Report needed: No. Victim of Neglect:No. Perpetrator of - none  Witness / Exposure to Domestic Violence: No   Protective Services Involvement: No  Witness to [employer] Violence:  No   Family History:  Family History  Problem Relation Age of Onset   Cancer Maternal Grandfather    Colon cancer Neg Hx    Colon polyps Neg Hx    Esophageal cancer Neg Hx    Rectal cancer Neg Hx    Stomach cancer Neg Hx     Social History:  Social History   Socioeconomic History   Marital status: Single    Spouse name: Not on file   Number of  children: Not on file   Years of education: Not on file   Highest education level: Some college, no degree  Occupational History   Not on file  Tobacco Use   Smoking status: Every Day    Current packs/day: 1.50    Average packs/day: 1.5 packs/day for 48.6 years (72.9 ttl pk-yrs)    Types: Cigarettes    Start date: 03/22/1975   Smokeless tobacco: Never   Tobacco comments:    He reports he has quit 3 times and recently started back in 2012. Hsm   Vaping Use   Vaping status: Never Used  Substance and Sexual Activity   Alcohol use: Not Currently   Drug use: No   Sexual activity: Not on file  Other Topics Concern   Not on file  Social History Narrative   Not on file   Social Drivers of Health   Financial Resource Strain: Low Risk  (04/25/2023)   Overall Financial Resource Strain (CARDIA)    Difficulty of Paying Living Expenses: Not very hard  Recent Concern: Financial Resource Strain - Medium Risk (02/14/2023)   Overall Financial Resource Strain (CARDIA)    Difficulty of Paying Living Expenses: Somewhat hard  Food Insecurity: No Food Insecurity (04/25/2023)   Hunger Vital Sign    Worried About Running Out of Food in the Last Year:  Never true    Ran Out of Food in the Last Year: Never true  Transportation Needs: No Transportation Needs (04/25/2023)   PRAPARE - Administrator, Civil Service (Medical): No    Lack of Transportation (Non-Medical): No  Physical Activity: Inactive (04/25/2023)   Exercise Vital Sign    Days of Exercise per Week: 0 days    Minutes of Exercise per Session: 10 min  Stress: Stress Concern Present (04/25/2023)   [employer] of Occupational Health - Occupational Stress Questionnaire    Feeling of Stress : Very much  Social Connections: Moderately Isolated (04/25/2023)   Social Connection and Isolation Panel    Frequency of Communication with Friends and Family: Three times a week    Frequency of Social Gatherings with Friends and Family: Once a  week    Attends Religious Services: Never    Database administrator or Organizations: Yes    Attends Engineer, structural: More than 4 times per year    Marital Status: Never married    Living situation: the patient lives alone  Sexual Orientation:  Straight  Relationship Status: single  Name of spouse / other:             If a parent, number of children / ages: none  Support Systems; parents  Financial Stress:  No   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   Educational History: Education: high school diploma/GED  Religion/Sprituality/World View:   none stated   Any cultural differences that may affect / interfere with treatment:  none   Stressors: interpersonal  Strengths:  Supportive Relationships  Barriers:  none  Legal History: Pending legal issue / charges: none History of legal issue / charges: none     Medical History/Surgical History: Past Medical History:  Diagnosis Date   Alcohol abuse    Anxiety    Bimalleolar fracture of left ankle    Depression    Heart murmur    HLD (hyperlipidemia)    HTN (hypertension)    Mitral valve insufficiency    Sleep apnea    uses CPAP nightly   Tobacco use    Vitamin D  deficiency     Past Surgical History:  Procedure Laterality Date   ORIF ANKLE FRACTURE Left 09/19/2017   Procedure: OPEN REDUCTION INTERNAL FIXATION (ORIF) ANKLE FRACTURE;  Surgeon: Beverley Evalene BIRCH, MD;  Location: [facility];  Service: Orthopedics;  Laterality: Left;   TOOTH EXTRACTION      Medications: Current Outpatient Medications  Medication Sig Dispense Refill   amLODipine  (NORVASC ) 5 MG tablet Take 1 tablet (5 mg total) by mouth daily. 90 tablet 3   atorvastatin  (LIPITOR) 40 MG tablet Take 1 tablet (40 mg total) by mouth daily. 90 tablet 3   Cholecalciferol (VITAMIN D3) 125 MCG (5000 UT) CAPS Take 5,000 Units by mouth daily in the afternoon.     lithium  carbonate 300 MG capsule  Take 3 capsules (900 mg total) by mouth at bedtime. 270 capsule 0   PARoxetine  (PAXIL ) 30 MG tablet Take 1 tablet (30 mg total) by mouth daily. 90 tablet 0   QUEtiapine  (SEROQUEL  XR) 400 MG 24 hr tablet Take 1 tablet (400 mg total) by mouth at bedtime. 90 tablet 0   No current facility-administered medications for this visit.    Subjective:  Assessed progress this patient presented for session on time.  He stated that he is reflected on his past sessions and identified the need to  be heard by others.  This was further explored where he identified primarily relating this to interaction with family, his parents specifically.  He reviewed how they separated when he was age 52 but how they seem similar.  He went on to share how he feels his mother has some traits on the autistic spectrum as well as his father.  He stated that as a result he often did not feel emotionally supported often, going on to share how his father was often dogmatic in his views and deliberate when he would speak to him or his brother.  He shared how this led to further strain in the relationship between him and his brother but how this relationship has improved over the years.  He went on to share how he talks to his father every few weeks but he has to instigate the communication.  He expressed some acceptance of how this relationship has transpired, specifically how he feels his father will not change with his communication in this regard.  He also doubts his mother will change much but feels they have a closer relationship.  He continues to report feeling depressed at times, today, plans on taking a nap later although he got enough rest he stated.  Encouraged him to identify thoughts that he feels contribute to his depression, encouraged listing these thoughts and journaling where he was receptive.  Interventions: Supportive therapy, motivational interviewing, CBT     Diagnoses:  No diagnosis found.    Plan: Patient to  follow through with coping as identified in session.  Long-term goal:  Reduce overall level, frequency, and intensity of the feelings of depression for at least 3 consecutive months.  Short-term goal: To identify and process feelings related to the disappointment of past painful events that increase worthless feelings.                   Identify situations and associated thoughts that contribute to his feelings of depression.                              Improve functioning where he has increased energy, needing less naps throughout the day.  Assessment of progress:  progressing    Assessment of progress:  progressing     Lonni Fischer, [facility]

## 2023-11-06 ENCOUNTER — Telehealth: Payer: Self-pay | Admitting: *Deleted

## 2023-11-06 NOTE — Telephone Encounter (Signed)
-----   Message from Wilbert Bihari sent at 10/19/2023  1:30 PM EDT ----- Please call [employer] to get him on airview and get a download

## 2023-11-06 NOTE — Telephone Encounter (Signed)
 [name], Jonathon 10/07/2023 - 11/05/2023 Patient ID: [id number] DOB: 1965-04-23 Age: 58 years 30.[address] [name] , [address] Compliance Report Usage 10/07/2023 - 11/05/2023 Usage days 30/30 days (100%) >= 4 hours 28 days (93%) < 4 hours 2 days (7%) Usage hours 232 hours 20 minutes Average usage (total days) 7 hours 45 minutes Average usage (days used) 7 hours 45 minutes Median usage (days used) 7 hours 59 minutes Total used hours (value since last reset - 11/05/2023) 10,299 hours AirSense 11 AutoSet Serial number [id number] Mode AutoSet Min Pressure 6 cmH2O Max Pressure 20 cmH2O EPR Ramp Only EPR level 2 Response Standard Therapy Pressure - cmH2O Median: 8.8 95th percentile: 14.8 Maximum: 16.6 Leaks - L/min Median: 1.1 95th percentile: 25.0 Maximum: 38.8 Events per hour AI: 2.5 HI: 0.9 AHI: 3.4 Apnea Index Central: 1.1 Obstructive: 0.7 Unknown: 0.6 RERA Index 0.5 Cheyne-Stokes respiration (average duration per night) 0 minutes (0%) Usage - hours Printed on 11/06/2023 - [employer] AirView version 4.49.0-5.0 Page 1 of 1

## 2023-11-29 ENCOUNTER — Encounter: Payer: Self-pay | Admitting: Cardiology

## 2023-11-29 ENCOUNTER — Ambulatory Visit (HOSPITAL_COMMUNITY)
Admission: RE | Admit: 2023-11-29 | Discharge: 2023-11-29 | Disposition: A | Discharge status: Home / Self Care | Source: Ambulatory Visit | Attending: Cardiovascular Disease | Admitting: Cardiovascular Disease

## 2023-11-29 ENCOUNTER — Ambulatory Visit: Payer: Self-pay | Admitting: Cardiology

## 2023-11-29 DIAGNOSIS — I340 Nonrheumatic mitral (valve) insufficiency: Secondary | ICD-10-CM | POA: Insufficient documentation

## 2023-11-29 DIAGNOSIS — I341 Nonrheumatic mitral (valve) prolapse: Secondary | ICD-10-CM | POA: Insufficient documentation

## 2023-11-29 LAB — ECHOCARDIOGRAM COMPLETE: S' Lateral: 3.23 cm

## 2023-11-29 NOTE — Telephone Encounter (Signed)
-----   Message from Wilbert Bihari sent at 11/29/2023  3:59 PM EDT ----- Echo showed normal heart function EF 60-65%, myxomatous MV with moderate MVP of the posterior leaflet with moderate to severe MR.  Please get patient in with extender to get set up for TEE to assess  MV further ----- Message ----- From: Interface, [employer] One Seven Sent: 11/29/2023   2:33 PM EDT To: Wilbert JONELLE Bihari, MD

## 2023-11-29 NOTE — Telephone Encounter (Signed)
 Call to patient to advise of echo results, no answer. Left message per DPR asking patient to call our office to discuss echo results.

## 2023-11-30 ENCOUNTER — Telehealth: Payer: Self-pay | Admitting: Cardiology

## 2023-11-30 NOTE — Telephone Encounter (Signed)
 Shlomo Wilbert SAUNDERS, MD  Result Note Echo showed normal heart function EF 60-65%, myxomatous MV with moderate MVP of the posterior leaflet with moderate to severe MR.  Please get patient in with extender to get set up for TEE to assess MV further   The patient has been notified of the result and verbalized understanding.  All questions (if any) were answered. Glenette Bookwalter Chauvigne, RN 11/30/2023 1:57 PM   Patient has been scheduled for an appointment with APP to discuss TEE

## 2023-11-30 NOTE — Telephone Encounter (Signed)
 Patient returning Erica's call in regards to echo results

## 2023-12-02 LAB — LITHIUM LEVEL: Lithium Lvl: 0.8 mmol/L (ref 0.6–1.2)

## 2023-12-06 ENCOUNTER — Encounter: Payer: Self-pay | Admitting: Psychiatry

## 2023-12-06 ENCOUNTER — Ambulatory Visit: Admitting: Psychiatry

## 2023-12-06 ENCOUNTER — Encounter (HOSPITAL_BASED_OUTPATIENT_CLINIC_OR_DEPARTMENT_OTHER): Payer: Self-pay

## 2023-12-06 DIAGNOSIS — E538 Deficiency of other specified B group vitamins: Secondary | ICD-10-CM

## 2023-12-06 DIAGNOSIS — F422 Mixed obsessional thoughts and acts: Secondary | ICD-10-CM

## 2023-12-06 DIAGNOSIS — F1021 Alcohol dependence, in remission: Secondary | ICD-10-CM

## 2023-12-06 DIAGNOSIS — G4733 Obstructive sleep apnea (adult) (pediatric): Secondary | ICD-10-CM | POA: Diagnosis not present

## 2023-12-06 DIAGNOSIS — F3181 Bipolar II disorder: Secondary | ICD-10-CM | POA: Diagnosis not present

## 2023-12-06 DIAGNOSIS — F411 Generalized anxiety disorder: Secondary | ICD-10-CM

## 2023-12-06 DIAGNOSIS — Z79899 Other long term (current) drug therapy: Secondary | ICD-10-CM

## 2023-12-06 DIAGNOSIS — R7989 Other specified abnormal findings of blood chemistry: Secondary | ICD-10-CM

## 2023-12-06 MED ORDER — CARIPRAZINE HCL 3 MG PO CAPS: 3.0000 mg | ORAL_CAPSULE | Freq: Every day | ORAL | Status: DC

## 2023-12-06 NOTE — Progress Notes (Signed)
 Jonathon Walker [id number] 1965/03/30 58 y.o.  Subjective:   Patient ID:  Jonathon Walker is a 58 y.o. (DOB 14-Feb-1966) male.  Chief Complaint:  Chief Complaint  Patient presents with   Follow-up   Depression   Anxiety   Manic Behavior   Fatigue    Jonathon ESQUIVIAS presents to the office today for follow-up of anxiety , depression, alcohol.    seen August 7.  His anxiety was unmanaged at Paxil  40 mg and olanzapine  5 mg so olanzapine  was increased to 7.5 daily.  seen December 17, 2018.  His anxiety was unmanaged.  The following change was made: Trial 1-1/2 of the 7.5 mg tablets of olanzapine  for anxi.  He is had a stay at [facility] for alcohol dependence since he was last here. Stayed 28 days and DC before Thanksgiving.  Very helpful.  Was having NM nighly before and they stopped since then.  Not drinking.  Involved in AA since then.  seen March 25, 2019.  The patient was doing better requested a reduction in olanzapine  to 7.5 mg nightly to improve energy.  visit April 03, 2019.  He had remained sober and his anxiety was improved with sobriety.  He was having problems with low motivation and forgetfulness.  At the next refill it was decided olanzapine  would be reduced from 7.5 to 5 mg daily. He called requesting this urgent appointment because of worsening symptoms associated with returning to work.  seen May 21, 2019.  He was doing relatively well.  The following was noted: Tolerating Wellbutrin  and it seems to help. He didn't tolerate the increase. Anxiety is improved off alcohol.  Ok to reduce olanzapine  to 2.5 mg daily for a month and stop it.  Hopefully low motivation will improve.   He called back March 23 stating he was more depressed and having a harder time doing routine tasks and having suicidal thoughts.  Because of worsening depression after reducing the olanzapine  he was encouraged to increase olanzapine  back to 5 mg nightly. He called again March 24 stating  he was really struggling but was scheduled for an appointment on March 26.  As of June 14, 2019 he reports the following: Every day a real hard struggle to get through the days.  More anxious and depressed equally. Worse 2 weeks.  Last Saturday so overwhelmed by how he feels he had SI.  Everything is hard. Initial benefit paroxetine  has been lost.  Awakens stressed and tired. Adequate hours of sleep.  Will be major chore just to mow grass.  Still sober. SE sexual. Getting appt with CPAP doc. Changes made include: Increasing olanzapine  back to 5 mg daily a couple of days prior to the appointment due to phone call and the addition of lithium  300 mg for a few days then increase to 600 mg nightly both to augment the antidepressant and because of suicidal thoughts.  July 15, 2019 appointment, the following is noted: Doing OK with both depression and anxiety.  Kind of like a top always going in mind.  Even when I sleep, when awakens mind still going.  NM nightly for years but less than before stopped drinking.  Smaller and less severe ones that don't wake him.  Theme can't finish things.  Both depression and anxiety 3/10.  More productive.  Paces himself.  Can live with it.  Sleep 9 hours.  Initially olanzapine  stopped the ruminating but it's back some.  Overall still benefit.  No SI and can't  rmember why he had them before. Residual energy not great and some ruminating. He had some concerns about sexual side effects from Paxil  but agreed that no med changes were necessary despite residual symptoms of depression and anxiety.  08/14/2019 appointment, the following is noted:  He scheduled urgently. Vaccinated. Angry easily and exhausted and everything is a chore.  Exhausted. Never had appt with CPAP company.  CPAP is 59 years old and on same settings as in the past.  CPAP machine is not working properly but when it did it was helpful for alertness and energy.  Disc need to call them to have it evaluated. To  bed 10-7.  Don't feel rested in morning but used to feel rested when first started paroxetine . Plan: Increase Lithium  continue to 900 mg daily for irritability  11/19/19 appt with the following noted: He increased to 900 mg and then reduced it but doesn't know why.  Reduced it to 300 nightly. Irritability much better.  Once anger since here with rage but not outward. Still on paroxetine  40, olanzapine  5, Wellbutrin  75 AM. Getting tired in afternoon and worrying about getting depressed but he's not depressed now.  Don't enjoy things like he used to.  Les interest and excitement. Appetite and sleep are normal.   Still adjusting without alcohol and socialization. Worrying about lack of sex drive with paroxetine . Can still ruminate on things until he gets some reassurance through talking with people. Plan: Continue low-dose lithium  300 mg daily Continue Paxil .  Did not feel well at 59 mg so we will continue 40 mg.  Likely to lose anxiety benefit if change it. Continue low-dose Wellbutrin  75  Tolerating Wellbutrin  and it seems to help. He didn't tolerate the increase.   Continue olanzapine  5 mg  02/18/2020 appointment with following noted: Open to trying increase paroxetine  again bc ruminating wears him out and gets fatigued and it wears him out though is 80% better vs before paroxetine . Doesn't remember trying higher paroxetine .  Ruminates on relationships or work.  Whenever he can talk about it with someone it helps tremendously but also realizes paroxetine  helps. Anxiety affects dreams and NM too.  Up and down a little.  Anxiety drove depression before paroxetine .  Best med he's tried. SOBER 1 YEAR SE no problem.  Except gained 25# in 4 years.  No change in diet and exercise.  No increase in appetite and no food cravings.  Seeing PCP soon. Plan: Increase paroxetine  trial to 59 for rumination.  Disc SE.  05/26/20 appt noted: No benefit increase paroxetine  nor SE.  Wants to reduce back to 30 mg  paroxetine  and stop lithium  bc it didn't seem to help.   4 days of Calm app has seemed to help rumination.  Does it in afternoon and before bed and it seemed to helps.  Doing a 10 min meditation and it helps.   Dep 4/10.  Anxiety 4/10. And a lot better the last few days.   Plan: Reduce lithium  by 1 tablet per week.  Call if there is any suicidal thought. Starting April 1 reduce paroxetine  to 40 mg daily. Continue low-dose Wellbutrin  75  Tolerating Wellbutrin  and it seems to help. He didn't tolerate the increase.   If doing OK will try taper as he suggested at next visit. Continue olanzapine  5 mg nightly it was helpful for depression and anxiety.  07/01/2020 appt with following noted: Moved appt up. Now says he understated depression level when he was here the last time.  Restarted lithium  bc felt more depressed without it. Reduced paroxetine  to 40 mg daily. Lethargic.  Mind won't calm down.  Nonrestorative sleep but 8 hours.  Doesn't think sleep is deep enough.  Tense. Goes to [employer]. Started counseling. Wants to try something different with meds. Can have mixed sx for 6 weeks with increased interest in things and motivation and want to spend money but still feels depressed.  3 times in the last year. Plan: Increase  lithium  back to 3 daily to see if depression is better and less mood cycling.  He admits to feeling worse when he stopped the lithium  Continue the reduce paroxetine  to 45 mg daily. DC Wellbutrin  Lamotrigine  trial 25 mg to 100 mg daily over 4 weeks. Continue olanzapine  5 mg nightly it was helpful for depression and anxiety.  08/26/2020 appointment with the following noted: Sad and down.  Classic depression sx and very fatigued.  Unusual to have classic depression bc usually is atypical.  No effect from lamotrigine . Wonders if atorvastatin  is causing some confusion or depression bc read about it. Depressed for 3 mos. Gained 20#.  It's leveled off now. Olanzapine  helped  the rumination and anxiety.   Life is more depressing bc not seeing friends like it was bc stopped drinking.   No SE with lithium .  Unless dropping things. Sleep 1030 to 6 but doesn't sleep deep.  Not rested in AM Plan: Increase  lithium  back to 3 daily to see if depression is better and less mood cycling.  He admits to feeling worse when he stopped the lithium  Continue the reduce paroxetine  to 40 mg daily bc it helped anxiety. Lamotrigine  increase to 150 mg daily DT no benefit or SE Increase olanzapine10 mg nightly it was helpful for depression and anxiety.  Need more help with depression   09/11/2020 phone call: Pt stated he is foegetting things throughout the day and very unsteady.He works with tools and equptment and this is a problem.He stated he takes 6 Lamictal  in the am. MD response: We made 3 med changes at the last visit including increasing lamotrigine  to 150 mg daily, increasing olanzapine  to 10 mg daily, and restarting lithium .  Any 1 of those changes could possibly cause some of the side effects.  We will have to make 1 change at a time to evaluate this.  Therefore reduce lamotrigine  to 100 mg daily.  It may take a couple of weeks before he sees a difference.  11/13/2020 phone call: Patient lm stating he is currently taking Lamotrigine  150 mg. He has decreased it to 75 mg due to side effects per pt ( blurred vision, shaking and coordination issues). Patient stated he is discontinuing before his scheduled October appointment. MD response: Take the lamotrigine  75 mg for 2 weeks and he should be able to stop then without withdrawal.  If he gets shakey, nervous then we need to reduce more slowly and let us  know that.  Otherwise he can stop if he follows these directions.  11/30/2020 phone call: Pt called and advised he would like to taper off of the Olanzapine .  He has gained 8# since his last visit and 30# total since he started taking the med.  He feels steady, not so much stress now, and  would like to see if he can take Lorazepam instead.  Then talk to Dr. Geoffry in Oct when they meet. MD response: Reduce olanzapine  to one half of the 10 mg tablet at night for 4 weeks and  then stop it.  If he gets more depressed then get back in touch with us  and we will try to do something about the weight gain that olanzapine  can cause by using the alternative Lybalvi  12/07/2020 appointment with the following noted: Off lamotrigine  .  Reduced olanzapine  to 5 mg for a week.  On lithium  900 mg HS, paroxetine  40 Fatigue 10/10.  Dizziness resolved off lamotrigine .  Low fatigue and motivation.  I want to want to do things.  Stopped drinking almost 18 mos and socially big adjustment.   Still invloved with AA and daily sponsor.  Working [employer].  Haroldine Young helped. Thinks mild depression with little interest and motivation and no joy.  But not severe.  No SI. No anxiety and it's way better.  Life is more stable and helps.  Working 4 hours daily and occ extrea jobs. Uses new CPAP.  Stil drowsy and fatigue. Plan: Continue CPAP use. New CPAP machine. Trial modafinil  100-200 mg AM Lamotrigine  off DT NR Reduced olanzapine  to 5 mg daily for a week and well stop in 3 weeks DT wt gain and fatigue  01/20/2021 phone call: I called patient to see if I could get better information. He states he started modafinil  a couple of weeks ago, though Rx was written for in September. He states he didn't feel well before taking the modafinil  though. He just says he is agitated, irritable, and unstable feeling. When asked what he meant by unstable he stated feeling a shell of myself.  He has been put on the cancellation list.  MD response: It is likely the modafinil  that is causing this problem.  But it may be corrected with a dose reduction.  I assume he is taking a whole tablet.  If that is the case time to reduce to 1/2 tablet in the morning.  If he is taking half a tablet tell him to reduce it to a quarter of a tablet.   And I agree we need to try to get him in as soon as possible. He reduced modafinil  from 200 mg to 100 mg daily.  02/10/2021 appointment with the following noted: Current psych meds lithium  900 mg nightly, paroxetine  40 mg daily, olanzapine  recently stopped, modafinil  recently tried. Was really bad for awhile feeling depresssed and agitated and anxiety but better now including after reducing modafinil  to 100 mg daily. Fatigue is better and in the middle now with energy, not good but not bad. Sleep is better.  CPAP doctor yesterday and they are gathering info pending. NAC is helping with memory. Still sober and committed to it. SE constipation  Plan: No med changes  03/10/2021 phone call from patient complaining of vague feelings of not feeling well and wanting the Paxil  changed.  He was placed on the cancellation list.  03/18/2021 appointment urgently scheduled at his request: Remains on paroxetine  40 mg daily, lithium  1200 mg daily, modafinil  200 mg every morning. Ruminating  with sense of need to talk to someone.  Then feels better for awhile.  Mostly on relationships with family.  No one to talk to. Wonders need to chang ethe meds.   Don't socialize anymore since sober.  Does online AA everyday at noon. Lost 10# off olanzapine . Plan: Continue paroxetine  40 mg, lithium  1200 mg daily, Try stopping modafinil  100mg  AM to determine if it is helping or not. If not less anxious then start risperidone  for rumination 1-2 mg HS  04/05/2021 appointment with the following noted: Stopped  modafinil  and it helped reduce anxiety. Started risperidone  2 instead of 1 mg and was off balance so reduced risperidone  to 1 mg HS. Only done this for 3 nights.  No SE with it. Anxiety is not close to as bad as originally.  Can be painful but not now. Still ruminating some my whole adult life but varies.  Anxiety can lead to depression. 6-7/10 with 10 good on depression with some problems with  ambition. Plan: Continue trial risperidone  for rumination 1 mg HS  04/07/2021 TC: Several phone calls: Called 2 days after the visit complaining of itching from the lithium  and wanting to come off of it.  He was warned he was more emotionally distressed when he reduced the lithium  in the past but he wanted to pursue it anyway.  He was continuing risperidone  1 mg nightly but wanting to reduce that also but was warned he would almost certainly have worsening mood severity and anxiety problems based on past experience.  He was cautioned against reducing lithium  below 900 mg daily and risperidone  below 1 mg nightly.  He agreed on 04/09/2021.  04/09/2021 he called again and several phone calls ensued thereafter.  He insisted on stopping lithium  and risperidone  despite warnings that his mood stability and anxiety would worsen. He was prescribed Depakote  to take the place  04/20/2021 appointment with the following noted: He stopped risperidone  and did not take the Depakote .  He is taking lithium  900 mg nightly and paroxetine  40 mg daily. Stopped risperidone  bc feeling agitated and unstable and blamed it.  Still feels the same off of it. Says itching 85% better with less lithium .  Itching for a couple of mos.  Doesn't think anxiety noticeably worse after reducing lithium  to 900 mg daily. Not dizzy.  Clumsy with hands. Anxious but not painful like it was before Paxil  but tense at the end of the day.  Initially felt normal for the first time in 20 years when first started paxil  but not as good now. Not sadness and hasn't had much of it. Started therapy with doctor on demand. Ruminating on fear of not being fully present and worries about little things like appts pending.   Afraid of working FT because gets overwhelmed bc I take it home with me Plan : Continue trial risperidone  but increase to rumination 1.5 mg HS Continue paroxetine  40 and lithium  600 mg   06/01/2021 appointment with the following  noted: Haven't seen benefit with risperidone  1.5 mg daily. Not in pain but feels disconnected and not myself.  Has felt like this off and on for years. Level of angst all day but not real high unless triggered.  Still has the rumination. Initially a lot of benefit from paroxetine  Not markedly deprssed and not sad.  07/14/21 appt noted:  Doesn't feel much different with switch to fluvoxamine  except anxiety well controlled. But still low energy, motivation and not as productive as he wants to be.  Not markedly sad.  Asks about Vraylar . Not much interest or enjoyment. Don't sleep that great and don't relax that great. On fluvoxamine  200, lithium  900, off risperidone . Plan: Anxiety controlled by fluvoxamine  200 Potentiate Vraylar  1.5 mg daily  07/27/2021 phone call complaining of ongoing depression.  Vraylar  increased to 3 mg daily. 07/29/2021 phone call complaining of cost of Vraylar .  Was given samples. 08/20/2021 phone call complaining of fatigue.  Stated he stopped the Vraylar  because it made him agitated.  It was recommended there be no further med changes because  he is only been off the Vraylar  for 1 week.  08/30/2021 appointment noted: Vraylar  1.5 mg didn't do much.  Increased to 3 mg and then felt irritable ans stopped it about 3rd week of May.  Better now. Still extremely tired and feels physically sick with normal workup. Thinks his mind wears him out.  Racing negative thoughts all the time. Asks about Ativan. Always nervous and anxious.  Trouble discerning anxiety from depression. Plan: Anxiety was controlled by fluvoxamine  200 but now he thinks it is worse. So increase 250 mg daily  10/18/21 appt noted: No results with increase fluvoxamine .  No SE. Anxiety is still worse than depression.  Thinks it causes somatic sx like the flu.  Exhausted. GI. Poor sleep and concentration. Asks about lorazepam.   Got checked out by PCP without findings. Plan: Reduce lithium  to 2 capsules daily to  see if physical symptoms like fatigue etc. are better Increase fluvoxamine  to 1 tablet in the morning and 2 tablets in the evening to reduce anxiety Plan Trial reduction of  lithium  900  to 600 mg daily to see if somatic sx are better like fatigue.   01/06/22 appt noted Everything is about the same with med changes. CC anxiety and fatigue.  Sx started with severe depression in 20s and got better but never resolved. Thinks anxiety is causing fatigue.  Has to nap about 1 pm.  Easily fatigued.  No particular worry other than how he feels and himself.. Plan: Reduce fluvoxamine  gradually to 150 mg daily Start clomipramine  25 mg nightly for 5 days and increase to 75 mg HS  02/07/22 TC:  Pt lvm that the clomiprame 25 mg has been taking the full dose for two weeks now. He is experiencing mania. Overspending etc    MD resp:  Increase the lithium  back to 3 daily.  Stop the clomipramine .  Resume the risperidone  until these symptoms clear up.  Call us  back next week and let us  know how it is going.     02/16/22 TC:   Pt was just calling to give you an update.He stated he is still 50 percent manic but feels better.He is currently taking   lithium  carbonate 300 MG 3 daily risperiDONE  3 mg 1/2 tab daily fluvoxamine  100 MG tablet 1/2 tablet in the AM and 1 tablet at night On 11/20 changes were made,you had him stop clomipramine                      and increase lithium  to 900 mg daily.  02/16/22 MD: check lithium  level.  02/24/22 tC complaining of depression. MD :  He has had recent mood swings from manic to depressed by his report.  Lithium  level is low at 0.5 on 600 mg daily.  I think the most helpful thing for his mood would be to increase lithium  back to 900 mg daily.      03/07/22 TC: pt reported increasing his Luvox  to 200 mg daily.  03/08/22 appt noted: Multiple phone calls since here.   Clomipramine  triggered mania like he's never had before.  Increased over the top looking for a house,  wanting to invent things, bought a shed and fish tank abruptly.  Left his job which was a good thing.  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  I feel a lot better about it.  Has disability and will do PT work.  I'll be OK. Exhausted and can't be on his feet  all the time.   Sleep is different with EMA and ready to go but watches TV awhile and then goes back to sleep.  Always a late sleeper all his life.   Still feeling some depression and he increased the luvox  to 200 and it helped. Thinks parents are Autistic and don't respond emotionally.  M cannot understand feelings and he doesn't get any support.   Plan: Bc recent mania with clomipramine  increase lithium  1200  mg daily .    03/29/2022 appointment urgently due to recent mania: Psych meds: fluvoxamine  200, lithium  1200, risperidone  1.5 mg HS Exhausted until the last couple of days. SE tremor Has not had any water today and is lightheaded.   Mood a little better and anxiety better than it was.  No mania. Some underlying weight in mood that only responded to paroxetine  and clomipramine . Asks about Wellbutrin  for energy. Sleep normal 8-9 hours now.  Except last night. Goes to [facility] groups and support group and AA. Sober Reduce risperidone  to 1 mg to see if energy better  05/25/22 appt noted: Reduced lithium  about 10 days ago bc thought he might be getting too much bc read on the internet.  No changes since reducing it. More dep for a couple of mos which is unusual.  In bed a lot and more isolated and not normally what I have.  Not real sad but unmotivated and no energy to do things.    Start therapist Friday. No sig sadness.  Irritable and short with people over little things.  Kind of started after stopped drinking.  Worse.  Sleep on and off.  Erratic.  Looks for ward to night so can escape into sleep.   Anxious underlying for 30 years.  Hard to sit and relax and just watch TV.  Tends to ruminate. Asks to try Gaba. Now lethargy is worse  than chronic anxiety Reducing risperidone  made no changes good or bad. DC risperidone  1 mg HS Add Latuda  1/2 tablet for 1 week then 1 tablet in the the evening  07/06/22 appt :  Increased Latuda  up to 100 mg .  No clear SE. Exhausted mentally and it makes his body tired.  Working 10-2 and then comes home and lays in bed a couple of hours.  Not a deep sleep.  Never feels fully rested.  Sleep HS 10-7.   Normal appetitie.   Still some sadness and irritability.  Anxiety doesn't seem better but is having less mood swings and mania.  Anxiety wears him down.   Plan: Increase  Latuda  120 mg in the evening with food  07/21/22 appt noted: Trouble keeping mood chart.  If I talk and I'm heard then feels better for hours but then damn stopped up.  Almost like an obsessive thing to talk for relief. No clear effect of increase Latuda  except reduced som cognitive anxiety but not physical anxiety. Says there was med he took before paxil  that gave him instant relief and wonders about retrying it.  Doesn't know which med. Paxil  had best effect when it worked for a period of time. Exhausted but fidget when sits.   Dep and anxious moderate.  09/01/22 appt noted: Meds clonazepam  0.5 mg BID prn, fluvoxamine  200 mg daily, lithium  900 HS, latuda  120 mg daily, vit D3 This type of dep since November after mania is low energy, anhedonia, low appetite, a lot of time in bed, textbook depresssion, lack of interest but not debilitating sadness.   Tired of it.  Dep is worse than anxiety and used to be the other way around.  Not as irritable. In past dep was anxious and disconnected and wasn't as tired.  Had to leave work bc can't get himself out of bed.   Toss and turn in bed fidgety in bed, feels like has to move but no energy to walk.  Fidgety for 3 mos. Clonazepam  takes the edge off but feels a higher dose is needed.  Plan: Changes: Reduce Latuda  to 60 mg daily. Start samples of Auvelity  1 in the AM for dep that is  TRD  10/20/22 appt noted: Meds clonazepam  0.5 mg BID prn, fluvoxamine  200 mg daily, lithium  900 HS, latuda  60 mg daily, vit D3, out of Auvelity  after 2 weeks. Less restless but still has it 6/10 after reduced Latuda .  Thinks he had it before eLatuda but not sure. Takes an hour to get to sleep.   No change in dep from this visit to last visit. Did have positive effects from Auvelity  but ? SE conc.  Did help energy Lately more dep than anxiety.  Noted ASA helps anxiety. Couple times situational anger but no mood swings. Plan: Changes: Reduce fluvoxamine  to 1 and 1/2 tablet daily to see if restlessness better Reduce Latuda  from 60 to 40 mg daily to see if restlessness better Retry Auvelity  1 in the AM for 7 days then 1 twice daily  11/14/22 appt noted: Twitching and restlessness better.   Still some teeth grinding but better Auvelity  didn't help mood.  Ongoing classic lethargy, poor appetite.  Can't exercise bc depression.  Little tasks overwhelming.    clonazepam  0.5 mg BID prn, fluvoxamine  150 mg daily, lithium  900 HS, latuda  40 mg daily, vit D3,  Auvelity  BID SE constipation and bruxism. Sleep fair and best and most relaxed 730-930 AM.  To bed 10 pm.   12/26/22 appt noted: More relaxed if not underpressure but not working and needs to. Not much motivation or energy in AM.  Doesn't notice the effects of Seroquel  for many hours, but it is relaxing med. For last 7 years hard time with motivation and capacity.   Happened years ago and then it resolved after 7 years with Wellbutrin  Plan: Changes none:  Continue 1/2 quetiapine  XR 150 mg PM.  This is helping him to feel more calm in the morning than he has felt in a very long time.  However he thinks now he is a little too subdued but does not want to do reduce the dose any further.  Does not tolerate a higher dose because it is too sedating.  01/09/23 TC:  Patient taking 75 mg of Seroquel  and sleeping great at night, but also sleeping a lot  during the day. He reports he doesn't want to be around people, is content to be at home not doing anything. He rates depression as 7/10, anxiety 5/10. Reports anxiety is better since starting Seroquel . He also takes: Fluvoxamine  150 mg at bedtime Lithium  900 mg at bedtime, though 1200 is prescribed. No longer taking clonazepam .    MD resp:  There are a limited number of options left to us  and it is important that each med be tried at his most effective dose.  Seroquel  is FDA approved for depression in doses of 150 mg and higher.  The low dose he is currently on is enough to help anxiety and his sleep but probably not enough to help depression.  We may need to go as high as 200  or 300 mg a day.  He is probably fearful of being too sedated at higher doses but this medicine does not necessarily make people more sleepy at higher doses.  I think she got to go up to the full quetiapine  XR 150 mg daily and give that a try until his appointment with me.      02/08/23 appt noted: Psych med: no Clonazepam  0.5 mg twice daily as needed anxiety, fluvoxamine  150 nightly, lithium  900 nightly, quetiapine  XR 150 at 3 pm Less am drowsiness taking Seroquel  in the afternoon. Chronic fatigue unchanged.  Rare days without it.  Can do one task daily.  Can work 1 and 1/2 hours daily.  Naps about 2 pm.   Seroquel  helps sleep and anxiety.   Easily overwhelmed if multiple tasks.   Feels much better in the AM than in years.  By noon is worn out.   Plan: Increase for TRD quetiapine  XR 200 mg PM.   03/23/23 appt noted: Meds as above except incr Seroquel  XR 200 mg daily. Anxiety better but still some dep with less motivation maybe 20% better. No sig SE except dryness. URI for 8 days. Sleep 10 hours per usual.   Much more relaxed with Seroquel . Plan: Increase further for TRD quetiapine  XR 300 mg PM.   05/02/23 appt noted: Multiple phone calls since here complaining of ongoing sx and med rxns. Incr and decrease  fluvoxamine  to 150 now 100 complaining of no benefit for anxiety at the higher dose and feeling somewhat agitated.   Current meds:.  Fluvoxamine  100 nightly, lithium  900 nightly, quetiapine  ER 300 mg nightly. Felt annoyed, angry , agitated without reason and called.   Is a little better the last 10 days.  Not nearly as angry SE wt gain and constipation. Asks about switch to paxil  bc helped the most of anything.   Speaking to counselor.  Fearful childhood. Plan: Increase quetiapine  ER  to  200 mg 2 tablets in evening for 3 weeks then 3 tablets in the evening.   06/15/23 appt noted: Med: Seroquel  XR 600, lithium  900, fluvoxamine  150. Vit D 5000 helped Overall is a little better.  Less foggy.  Less dep. 30-40% better with combo. SE gained another 5# Vit D helped fatigue.  Still naps but less. Sleep is OK. Less angry but still has shortness.  But not as bad. Plan continue above longer  07/27/23 appt noted: Med as above 50-60% of himself.  Mind wears him out and then has to take a nap.  It's been a lot worse than this before but would like change meds to get better back to paroxetine .   No changes with increase Seroquel . Willing to come back to this if sx are not better with paroxetine .   Physical weakness is rough.  But doesn't work so doesn't have to do much .  Last year could work 4 hours daily but not now.  Get overwhelmed really quickly with little matters.  Gets irritable. Plan: Plan: Reduce fluvoxamine  to 1/2 tablet and add 1/2 paroxetine  for 5 days, Then stop fluvoxamine  and increase paroxetine  to 1 tablet daily.    08/16/23 TC:  Pt reported he was having bad leg cramps from his hips to his feet. He said he googled and read that taking Seroquel  and Paxil  could give him too much dopamine. He decreased Seroquel  from 600 mg to 300 mg and reports after 2 days the cramps went away, he is more relaxed, less agitated, and is sleeping  well. He is taking: Paxil  30 Lithium  900 Seroquel  300  He  doesn't need/isn't asking for anything, just wanted to make you aware.        6+/18/25 appt noted:  Med:  Seroquel  XR 300 pm, paroxetine  30, no fluvoxamine  , lithium  900 HS.  Helped leg cramps with less Seroquel  XR and better mental clarity.   Paxil  helped anxiety tremendously.   Still dealing with depression.  Exhausted by 1 pm and naps.   Sleep regular hours but not restful.  Moderate helpful.  9 hours.  CPAP. SE no sig other than constipation and hard to lose wt. But not gaining. Not enough fluids and gets lightheaded.   Plan: Plan: Reduce Seroquel  XR to 200  09/15/23 TC:   10/18/23 appt noted:  Med:  Pt lvm 6/26 @ 8:00 pm. Pt stated he decreased to 200 mg Seroquel  last week and 6/25 had mild mania. Gone now. Should he stay with 200 mg or increase back to 300 mg.    resp:  Patient reporting he did have one day of mania on reduced dose of Seroquel  and he went back up to 300 mg and he is doing better.     09/21/23 TC:  Pt had called 6/27 and left message that he had reduced Seroquel  to 200 mg. He reported an episode of mania and went back to 300 and was doing better.  Today he is reporting he feels like he is going to lose his marbles. I asked for examples and he said he didn't feel stable. He said he had a lot of 200 mg tablets and asked if he could go back to 400 mg, or if you had another suggestion. He said he had to decrease dose because his legs locked up. When asked he has no active SI, but said he wish he wasn't here. He did say that he was going to be really busy until 1:00 today and to leave him a message if I called and he didn't answer.     MD resp:  Take 300-400 mg HS of Seroquel  nightly.  Stay hydrated and take 1 magnesium tablet daily to prevent cramps.  We can discuss other options at his next appt if needed    Lorene Macintosh, MD, [facility]  10/18/23 appt noted:  Med:  Seroquel  XR 400 pm, paroxetine  30,  lithium  900 HS.  Better with 400 mg vs lower, resolved hypomania with  buying.  Not wasteful buying.   2 brothers on Seroquel .  One of them on disability with OCD but passed away. Paxil  very helpful.  More at peace. Get some tiredness in afternoon and has to nap 90 mins.  Done this for years. Normal sleep at night, but maybe not as refreshed, like something is on my mind for the last 6 mos.  Feels like he is half awake at night.  It is bearable.  But asks about Ambien. Mood and anxiety better than a long time.   Plan: continue Seroquel  XR 400, paroxetine  30, lithium  900.  No changes today But check lithium  level.  12/06/23 appt Med:  Seroquel  XR 400 pm, paroxetine  30,  lithium  900 HS.  Not going that good.  Mind running and tired and some mild mania.  Reduced conc, some dep.   Exhausted and has to nap in afternoon after 8 hours at night.   Anxiety is pretty high with rumination.  Helps to talk to people.   Knows we tried a variety of other meds.  Asks maybe to retry Vraylar .  Reviewed history Dwells on fear of how he feels.  If talks to someone about it it is relieved but then recurs after 3 days. Wonders if it is OCD.    On disability for 7-8 years.  Working PT Before treatment did not sleep and RX Ambien and Wellbutrin  [facility] for alcohol.  Sober 2 year 01/2019  Past psych meds:   Paxil  60 too much + wt gain,   duloxetine, Zoloft,  Viibryd 60 partial resp (trial after paroxetine ) SE diarrhea. Trintellix 10 agitated Wellbutrin  75 (max tolerated),   Fluvoxamine  300 no better than 200 which seems to take edge off anxiety Clomipramine  mania Auvelity  4 weeks NR  Abilify, Rexulti 2. Vraylar  irritable at 3 mg daily.  Latuda  120 fidgety Olanzapine  10 fatigue Risperdal  2 once Seroquel  XR 600 benefit partial; 200 daily lost response  Buspar 30 BID stopped DT partial effect.  Xanax craving,   lithium   900, worse when he stopped lithium  Lamotrigine  NR CO dizzy  NAC 600 helped Modafinil  200 agitated Took lorazepam  in the past, Xanax less  helpful Ambien worked.    B severe OCD obsessions psych tx.  Review of Systems:  Review of Systems  Constitutional:  Positive for fatigue. Negative for unexpected weight change.  Cardiovascular:  Negative for palpitations.  Gastrointestinal:  Positive for constipation. Negative for nausea.  Neurological:  Negative for tremors.  Psychiatric/Behavioral:  Positive for dysphoric mood. Negative for agitation, behavioral problems, confusion, decreased concentration, hallucinations, self-injury, sleep disturbance and suicidal ideas. The patient is nervous/anxious. The patient is not hyperactive.     Medications: I have reviewed the patient's current medications.  Current Outpatient Medications  Medication Sig Dispense Refill   amLODipine  (NORVASC ) 5 MG tablet Take 1 tablet (5 mg total) by mouth daily. 90 tablet 3   atorvastatin  (LIPITOR) 40 MG tablet Take 1 tablet (40 mg total) by mouth daily. 90 tablet 3   cariprazine  (VRAYLAR ) 3 MG capsule Take 1 capsule (3 mg total) by mouth daily.     Cholecalciferol (VITAMIN D3) 125 MCG (5000 UT) CAPS Take 5,000 Units by mouth daily in the afternoon.     lithium  carbonate 300 MG capsule Take 3 capsules (900 mg total) by mouth at bedtime. 270 capsule 0   PARoxetine  (PAXIL ) 30 MG tablet Take 1 tablet (30 mg total) by mouth daily. 90 tablet 0   QUEtiapine  (SEROQUEL  XR) 400 MG 24 hr tablet Take 1 tablet (400 mg total) by mouth at bedtime. 90 tablet 0   No current facility-administered medications for this visit.    Medication Side Effects: sexual, weight  Allergies: No Known Allergies  Past Medical History:  Diagnosis Date   Alcohol abuse    Anxiety    Bimalleolar fracture of left ankle    Depression    Heart murmur    HLD (hyperlipidemia)    HTN (hypertension)    MVP (mitral valve prolapse)    moderate MVP of the middle scallop of the posterior MVL with moderate to severe MR echo 11/2023   Sleep apnea    uses CPAP nightly   Tobacco use     Vitamin D  deficiency     Family History  Problem Relation Age of Onset   Cancer Maternal Grandfather    Colon cancer Neg Hx    Colon polyps Neg Hx    Esophageal cancer Neg Hx    Rectal cancer Neg Hx    Stomach cancer Neg Hx  Social History   Socioeconomic History   Marital status: Single    Spouse name: Not on file   Number of children: Not on file   Years of education: Not on file   Highest education level: Some college, no degree  Occupational History   Not on file  Tobacco Use   Smoking status: Every Day    Current packs/day: 1.50    Average packs/day: 1.5 packs/day for 48.7 years (73.1 ttl pk-yrs)    Types: Cigarettes    Start date: 03/22/1975   Smokeless tobacco: Never   Tobacco comments:    He reports he has quit 3 times and recently started back in 2012. Hsm   Vaping Use   Vaping status: Never Used  Substance and Sexual Activity   Alcohol use: Not Currently   Drug use: No   Sexual activity: Not on file  Other Topics Concern   Not on file  Social History Narrative   Not on file   Social Drivers of Health   Financial Resource Strain: Low Risk  (04/25/2023)   Overall Financial Resource Strain (CARDIA)    Difficulty of Paying Living Expenses: Not very hard  Recent Concern: Financial Resource Strain - Medium Risk (02/14/2023)   Overall Financial Resource Strain (CARDIA)    Difficulty of Paying Living Expenses: Somewhat hard  Food Insecurity: No Food Insecurity (04/25/2023)   Hunger Vital Sign    Worried About Running Out of Food in the Last Year: Never true    Ran Out of Food in the Last Year: Never true  Transportation Needs: No Transportation Needs (04/25/2023)   PRAPARE - Administrator, Civil Service (Medical): No    Lack of Transportation (Non-Medical): No  Physical Activity: Inactive (04/25/2023)   Exercise Vital Sign    Days of Exercise per Week: 0 days    Minutes of Exercise per Session: 10 min  Stress: Stress Concern Present (04/25/2023)    [employer] of Occupational Health - Occupational Stress Questionnaire    Feeling of Stress : Very much  Social Connections: Moderately Isolated (04/25/2023)   Social Connection and Isolation Panel    Frequency of Communication with Friends and Family: Three times a week    Frequency of Social Gatherings with Friends and Family: Once a week    Attends Religious Services: Never    Database administrator or Organizations: Yes    Attends Engineer, structural: More than 4 times per year    Marital Status: Never married  Intimate Partner Violence: Not At Risk (02/15/2023)   Humiliation, Afraid, Rape, and Kick questionnaire    Fear of Current or Ex-Partner: No    Emotionally Abused: No    Physically Abused: No    Sexually Abused: No    Past Medical History, Surgical history, Social history, and Family history were reviewed and updated as appropriate.   Please see review of systems for further details on the patient's review from today.   Objective:   Physical Exam:  There were no vitals taken for this visit.  Physical Exam Constitutional:      General: He is not in acute distress.    Appearance: Normal appearance. He is well-developed.     Comments: Red face  Musculoskeletal:        General: No deformity.  Neurological:     Mental Status: He is alert and oriented to person, place, and time.     Motor: No tremor.     Coordination:  Coordination normal.     Gait: Gait normal.  Psychiatric:        Attention and Perception: Attention normal. He is attentive.        Mood and Affect: Mood is anxious and depressed. Affect is not labile, blunt, angry or inappropriate.        Speech: Speech normal.        Behavior: Behavior normal. Behavior is not agitated or slowed.        Thought Content: Thought content normal. Thought content is not delusional. Thought content does not include homicidal or suicidal ideation. Thought content does not include suicidal plan.         Cognition and Memory: Cognition normal.        Judgment: Judgment normal.     Comments: Insight  Fair. Continued mood problems  Sx worse again.  Obsessive vs ruminative on himself.       Lab Review:     Component Value Date/Time   NA 142 05/26/2023 1113   K 4.3 05/26/2023 1113   CL 107 05/26/2023 1113   CO2 26 05/26/2023 1113   GLUCOSE 106 (H) 05/26/2023 1113   BUN 23 05/26/2023 1113   CREATININE 1.22 05/26/2023 1113   CREATININE 1.01 03/01/2022 0904   CALCIUM  9.5 05/26/2023 1113   PROT 7.0 05/26/2023 1113   ALBUMIN 4.4 05/26/2023 1113   AST 18 05/26/2023 1113   ALT 39 05/26/2023 1113   ALKPHOS 97 05/26/2023 1113   BILITOT 0.4 05/26/2023 1113       Component Value Date/Time   WBC 6.4 05/26/2023 1113   RBC 4.81 05/26/2023 1113   HGB 15.2 05/26/2023 1113   HGB 15.2 05/05/2010 1049   HCT 43.8 05/26/2023 1113   HCT 44.8 05/05/2010 1049   PLT 197.0 05/26/2023 1113   PLT 193 05/05/2010 1049   MCV 91.0 05/26/2023 1113   MCV 89.6 05/05/2010 1049   MCH 30.4 05/05/2010 1049   MCHC 34.7 05/26/2023 1113   RDW 13.5 05/26/2023 1113   RDW 13.1 05/05/2010 1049   LYMPHSABS 2.5 07/21/2022 1143   LYMPHSABS 1.8 05/05/2010 1049   MONOABS 0.8 07/21/2022 1143   MONOABS 0.4 05/05/2010 1049   EOSABS 0.6 07/21/2022 1143   EOSABS 0.2 05/05/2010 1049   BASOSABS 0.1 07/21/2022 1143   BASOSABS 0.0 05/05/2010 1049    Lithium  Lvl  Date Value Ref Range Status  12/01/2023 0.8 0.6 - 1.2 mmol/L Final  02/22/23 lithium  0.7 on 900 mg daily. 04/01/22 lithium  level 0.9 on 1200 mg daily  02/02/21 lithium  level 0.6 on 900 mg daily  05/2023 vit D 30 started 5000 units helping fatigue  No results found for: PHENYTOIN, PHENOBARB, VALPROATE, CBMZ   .res Assessment: Plan:     Jonathon Walker was seen today for follow-up, depression, anxiety, manic behavior and fatigue.  Diagnoses and all orders for this visit:  Bipolar II disorder (HCC) -     cariprazine  (VRAYLAR ) 3 MG capsule; Take 1 capsule  (3 mg total) by mouth daily.  GAD (generalized anxiety disorder) -     cariprazine  (VRAYLAR ) 3 MG capsule; Take 1 capsule (3 mg total) by mouth daily.  Mixed obsessional thoughts and acts -     cariprazine  (VRAYLAR ) 3 MG capsule; Take 1 capsule (3 mg total) by mouth daily.  OSA (obstructive sleep apnea)  Lithium  use  Low vitamin D  level  Low serum vitamin B12  Alcohol dependence in remission (HCC)     Chronic recurrent rumination with anxiety and urgency  and sense of need and instablity with impatience ongoing with frequent phone calls between appts. but  but anhedonic depression and anxious.  Needs a lot of reassurance. Chronic worry and overwhelmed easily and misattributes sx as SE of meds leading to inadequate med trials.  Fear of meds.Falsely attributes his psych sx to the meds and then stops meds AMA.  False attribution of sx as SE.  But overall this is less of a problem than it was.  he describe history other manic cycling symptoms including increased interest, increased goal-directed behaviors, increased urge to spend that will last for 6 weeks or so and then stop.  He has these cycles about 3 times a year.  This is suggestive of a bipolar predisposition which was confirmed by recent cycle into mania from clomipramine .  Explained bipolar disorder underlying as reason never responded consistently to AD alone.  Anhedonic dep and anxiety better with Seroquel  400 and paroxetine  30 added.  Option pramipexole but difficult to use with quetiapine .   Continue CPAP use. New CPAP machine.  Discussed potential metabolic side effects associated with atypical antipsychotics, as well as potential risk for movement side effects. Advised pt to contact office if movement side effects occur.  He agrees.  Call if you have any problems with this. Failed attemp to reduce Seroquel  below 400 mg daily. Consider alternative Caplyta.  Or retry Vraylar . Try Vraylar  3 mg daily  He remains sober.     Bc late 2023 mania with clomipramine  increased lithium  1200  mg daily .   He cut it back to 900 mg daily.  He admits to feeling worse when he stopped the lithium .  Call if sx get worse. Counseled patient regarding potential benefits, risks, and side effects of lithium  to include potential risk of lithium  affecting thyroid  and renal function.  Discussed need for periodic lab monitoring to determine drug level and to assess for potential adverse effects.  Counseled patient regarding signs and symptoms of lithium  toxicity and advised that they notify office immediately or seek urgent medical attention if experiencing these signs and symptoms.  Patient advised to contact office with any questions or concerns. 02/02/21  02/02/21 lithium  level 0.6 on 900 mg daily, normal B12 and vitamin D  52 04/01/22 lithium  level 0.9 on 1200 mg daily  02/02/21 lithium  level 0.6 on 900 mg daily 02/22/23 lithium  0.7 on 900 mg daily. 12/01/23 0.8 on 900 mg daily.  Disc the off-label use of N-Acetylcysteine at 600 mg daily to help with mild cognitive problems.  It can be combined with a B-complex vitamin as the B-12 and folate have been shown to sometimes enhance the effect.  Partial benefit so continue NAC 1200 mg daily.  Continue vitamin D  DT history level 30  Constipation management 1.  Lots of water 2.  Powdered fiber supplement such as MiraLAX, Citrucel, etc. preferably with a meal 3.  2 stool softeners a day 4.  Milk of magnesia or magnesium tablets if needed  Plan: continue Seroquel  XR 400, paroxetine  30, lithium  900.  Vraylar  1.5 mg capsule daily for 1 week then 3 mg daily for 3 weeks, then if no benefit then increase to 4.5 mg  (or 3 mg +1.5 mg ).  If worsening mania then increase faster.  Try to stay as active as possible.  Encourage Kellen support group  Please see After Visit Summary for patient specific instructions.  FU 8 weeks  Lorene Macintosh, MD, DFAPA     Future Appointments  Date Time  Provider Department Center  12/08/2023  2:45 PM Janene Boer, [place] [facility]  02/19/2024 10:40 AM [facility]AL WE[facility] VISIT [facility] [address]    No orders of the defined types were placed in this encounter.       -------------------------------

## 2023-12-06 NOTE — Patient Instructions (Addendum)
 Vraylar  1.5 mg capsule daily for 1 week then 3 mg daily for 3 weeks, then if no benefit then increase to 4.5 mg  (or 3 mg +1.5 mg ) Encourage Kellen support group

## 2023-12-07 NOTE — H&P (View-Only) (Signed)
 Cardiology Office Note:  .   Date:  12/08/2023  ID:  Jonathon Walker, DOB 1965-05-21, MRN [id number] PCP: Merna Huxley, NP  [facility] Providers Cardiologist:  Wilbert Bihari, MD  History of Present Illness: .   Jonathon Walker is a 58 y.o. male with history of mitral regurgitation, previous alcohol abuse, anxiety, depression, bipolar, hyperlipidemia, hypertension, OSA on CPAP.     Mitral regurgitation Echo 07/2019 preserved biventricular function.  Mild to moderate MR.  Severe asymmetric LVH. Echo 11/2023 preserved biventricular function.  MV myxomatous, moderate to severe MR.  Prolapse of the middle scallop of the posterior leaflet.  No LVH noted.  Social history  On disability due to depression 1.5 pack/day smoker Previous alcohol abuse quit 5 years ago     Patient with history of mitral regurgitation but only just established care with cardiology 09/2023.  Most recent echocardiogram demonstrates progression and now moderate to severe with prolapse of the posterior leaflet.  Plans now for TEE.  Today patient presents for follow-up.  He reports that he has had crippling depression all of his life that has prevented him from working and really with no social support.  His brother lives up in [place] .  He is not married and does not have any children.  He attributes his depression as having a lack of energy and gets fatigued easily but this has been chronic for him.  He has not noticed any acute shortness of breath, peripheral edema.  Continues to smoke, he is interested in a counseling group.  He is followed by psychiatrist  ROS: Denies: Chest pain, shortness of breath, orthopnea, peripheral edema, palpitations, decreased exercise intolerance, fatigue, lightheadedness.   Studies Reviewed: .         Risk Assessment/Calculations:             Physical Exam:   VS:  BP 128/76   Pulse 84   Ht 6' 3 (1.905 m)   Wt 294 lb (133.4 kg)   SpO2 97%   BMI 36.75 kg/m    Wt Readings  from Last 3 Encounters:  12/08/23 294 lb (133.4 kg)  10/19/23 284 lb 6.4 oz (129 kg)  05/26/23 281 lb (127.5 kg)    GEN: Well nourished, well developed in no acute distress NECK: No JVD; No carotid bruits CARDIAC: RRR, 3 out of 6 murmur at the apex RESPIRATORY:  Clear to auscultation without rales, wheezing or rhonchi  ABDOMEN: Soft, non-tender, non-distended EXTREMITIES:  No edema; No deformity   ASSESSMENT AND PLAN: .    Mitral regurgitation with MVP Echo 11/2023 preserved biventricular function.  MV myxomatous, moderate to severe MR due to  prolapse of the middle scallop of the posterior leaflet.  Overall seems asymptomatic however his depression coincides with significant fatigue so difficult to distinguish but has not noticed any significant changes. Plan for TEE. Get BMP and CBC Will place orders.  LVH? Note on 2020 echocardiogram has been severe and asymmetric but echo above does not mention this.  Will clarify with Dr. Bihari interpretation and if this requires any further workup.  OSA Compliant with CPAP.  Hypertension Well-controlled.  Continue with amlodipine  5 mg daily.  Hyperlipidemia Continue atorvastatin  40 mg.  LDL 05/2023 was 79.  Depression Debilitating symptoms.  Follows with psychiatry.  Informed Consent   Shared Decision Making/Informed Consent The risks [esophageal damage, perforation (1:10,000 risk), bleeding, pharyngeal hematoma as well as other potential complications associated with conscious sedation including aspiration, arrhythmia, respiratory failure and death],  benefits (treatment guidance and diagnostic support) and alternatives of a transesophageal echocardiogram were discussed in detail with Jonathon Walker and he is willing to proceed.      Dispo: 2-week follow-up after his TEE.  Signed, Thom LITTIE Sluder, PA-C

## 2023-12-07 NOTE — Progress Notes (Unsigned)
 Cardiology Office Note:  .   Date:  12/08/2023  ID:  Jonathon Walker, DOB 1965-05-21, MRN [id number] PCP: Merna Huxley, NP  [facility] Providers Cardiologist:  Wilbert Bihari, MD  History of Present Illness: .   Jonathon Walker is a 58 y.o. male with history of mitral regurgitation, previous alcohol abuse, anxiety, depression, bipolar, hyperlipidemia, hypertension, OSA on CPAP.     Mitral regurgitation Echo 07/2019 preserved biventricular function.  Mild to moderate MR.  Severe asymmetric LVH. Echo 11/2023 preserved biventricular function.  MV myxomatous, moderate to severe MR.  Prolapse of the middle scallop of the posterior leaflet.  No LVH noted.  Social history  On disability due to depression 1.5 pack/day smoker Previous alcohol abuse quit 5 years ago     Patient with history of mitral regurgitation but only just established care with cardiology 09/2023.  Most recent echocardiogram demonstrates progression and now moderate to severe with prolapse of the posterior leaflet.  Plans now for TEE.  Today patient presents for follow-up.  He reports that he has had crippling depression all of his life that has prevented him from working and really with no social support.  His brother lives up in [place] .  He is not married and does not have any children.  He attributes his depression as having a lack of energy and gets fatigued easily but this has been chronic for him.  He has not noticed any acute shortness of breath, peripheral edema.  Continues to smoke, he is interested in a counseling group.  He is followed by psychiatrist  ROS: Denies: Chest pain, shortness of breath, orthopnea, peripheral edema, palpitations, decreased exercise intolerance, fatigue, lightheadedness.   Studies Reviewed: .         Risk Assessment/Calculations:             Physical Exam:   VS:  BP 128/76   Pulse 84   Ht 6' 3 (1.905 m)   Wt 294 lb (133.4 kg)   SpO2 97%   BMI 36.75 kg/m    Wt Readings  from Last 3 Encounters:  12/08/23 294 lb (133.4 kg)  10/19/23 284 lb 6.4 oz (129 kg)  05/26/23 281 lb (127.5 kg)    GEN: Well nourished, well developed in no acute distress NECK: No JVD; No carotid bruits CARDIAC: RRR, 3 out of 6 murmur at the apex RESPIRATORY:  Clear to auscultation without rales, wheezing or rhonchi  ABDOMEN: Soft, non-tender, non-distended EXTREMITIES:  No edema; No deformity   ASSESSMENT AND PLAN: .    Mitral regurgitation with MVP Echo 11/2023 preserved biventricular function.  MV myxomatous, moderate to severe MR due to  prolapse of the middle scallop of the posterior leaflet.  Overall seems asymptomatic however his depression coincides with significant fatigue so difficult to distinguish but has not noticed any significant changes. Plan for TEE. Get BMP and CBC Will place orders.  LVH? Note on 2020 echocardiogram has been severe and asymmetric but echo above does not mention this.  Will clarify with Dr. Bihari interpretation and if this requires any further workup.  OSA Compliant with CPAP.  Hypertension Well-controlled.  Continue with amlodipine  5 mg daily.  Hyperlipidemia Continue atorvastatin  40 mg.  LDL 05/2023 was 79.  Depression Debilitating symptoms.  Follows with psychiatry.  Informed Consent   Shared Decision Making/Informed Consent The risks [esophageal damage, perforation (1:10,000 risk), bleeding, pharyngeal hematoma as well as other potential complications associated with conscious sedation including aspiration, arrhythmia, respiratory failure and death],  benefits (treatment guidance and diagnostic support) and alternatives of a transesophageal echocardiogram were discussed in detail with Mr. Kinne and he is willing to proceed.      Dispo: 2-week follow-up after his TEE.  Signed, Thom LITTIE Sluder, PA-C

## 2023-12-08 ENCOUNTER — Encounter: Payer: Self-pay | Admitting: Physician Assistant

## 2023-12-08 ENCOUNTER — Ambulatory Visit: Attending: Physician Assistant | Admitting: Cardiology

## 2023-12-08 VITALS — BP 128/76 | HR 84 | Ht 75.0 in | Wt 294.0 lb

## 2023-12-08 DIAGNOSIS — G4733 Obstructive sleep apnea (adult) (pediatric): Secondary | ICD-10-CM

## 2023-12-08 DIAGNOSIS — I341 Nonrheumatic mitral (valve) prolapse: Secondary | ICD-10-CM | POA: Diagnosis not present

## 2023-12-08 LAB — BASIC METABOLIC PANEL WITH GFR
BUN/Creatinine Ratio: 17 (ref 9–20)
BUN: 19 mg/dL (ref 6–24)
CO2: 21 mmol/L (ref 20–29)
Calcium: 9.5 mg/dL (ref 8.7–10.2)
Chloride: 106 mmol/L (ref 96–106)
Creatinine, Ser: 1.15 mg/dL (ref 0.76–1.27)
Glucose: 103 mg/dL — ABNORMAL HIGH (ref 70–99)
Potassium: 4 mmol/L (ref 3.5–5.2)
Sodium: 143 mmol/L (ref 134–144)
eGFR: 74 mL/min/1.73 (ref >59)

## 2023-12-08 LAB — CBC
Hematocrit: 44.9 % (ref 37.5–51.0)
Hemoglobin: 15.3 g/dL (ref 13.0–17.7)
MCH: 31.2 pg (ref 26.6–33.0)
MCHC: 34.1 g/dL (ref 31.5–35.7)
MCV: 92 fL (ref 79–97)
Platelets: 211 x10E3/uL (ref 150–450)
RBC: 4.9 x10E6/uL (ref 4.14–5.80)
RDW: 12.4 % (ref 11.6–15.4)
WBC: 8.6 x10E3/uL (ref 3.4–10.8)

## 2023-12-08 NOTE — Patient Instructions (Signed)
 Medication Instructions:  Your physician recommends that you continue on your current medications as directed. Please refer to the Current Medication list given to you today.  *If you need a refill on your cardiac medications before your next appointment, please call your pharmacy*  Lab Work: TODAY: BMET, CBC If you have labs (blood work) drawn today and your tests are completely normal, you will receive your results only by: MyChart Message (if you have MyChart) OR A paper copy in the mail If you have any lab test that is abnormal or we need to change your treatment, we will call you to review the results.  Testing/Procedures: Your physician has requested that you have a TEE. During a TEE, sound waves are used to create images of your heart. It provides your doctor with information about the size and shape of your heart and how well your heart's chambers and valves are working. In this test, a transducer is attached to the end of a flexible tube that's guided down your throat and into your esophagus (the tube leading from you mouth to your stomach) to get a more detailed image of your heart. You are not awake for the procedure. Please see the instruction sheet given to you today. For further information please visit [email or web address]    Follow-Up: At [facility], you and your health needs are our priority.  As part of our continuing mission to provide you with exceptional heart care, our providers are all part of one team.  This team includes your primary Cardiologist (physician) and Advanced Practice Providers or APPs (Physician Assistants and Nurse Practitioners) who all work together to provide you with the care you need, when you need it.  Your next appointment:   KEEP SCHEDULED FOLLOW-UP  We recommend signing up for the patient portal called MyChart.  Sign up information is provided on this After Visit Summary.  MyChart is used to connect with patients for Virtual Visits  (Telemedicine).  Patients are able to view lab/test results, encounter notes, upcoming appointments, etc.  Non-urgent messages can be sent to your provider as well.   To learn more about what you can do with MyChart, go to [email or web address].   Other Instructions   You are scheduled for a TEE (Transesophageal Echocardiogram) on Wednesday, September 24 with Dr. Loni.  Please arrive at the [facility] (Main Entrance A) at [facility]: [address] [place], [place] [address] at 9:00 AM (This time is 1 hour(s) before your procedure to ensure your preparation).   Free valet parking service is available. You will check in at ADMITTING.   *Please Note: You will receive a call the day before your procedure to confirm the appointment time. That time may have changed from the original time based on the schedule for that day.*    DIET:  Nothing to eat or drink after midnight except a sip of water with medications (see medication instructions below)  MEDICATION INSTRUCTIONS: !!IF ANY NEW MEDICATIONS ARE STARTED AFTER TODAY, PLEASE NOTIFY YOUR PROVIDER AS SOON AS POSSIBLE!!  FYI: Medications such as Semaglutide (Ozempic, [place]), Tirzepatide (Mounjaro, Zepbound), Dulaglutide (Trulicity), etc (GLP1 agonists) AND Canagliflozin (Invokana), Dapagliflozin (Farxiga), Empagliflozin (Jardiance), Ertugliflozin (Steglatro), Bexagliflozin [employer]) or any combination with one of these drugs such as Invokamet (Canagliflozin/Metformin), Synjardy (Empagliflozin/Metformin), etc (SGLT2 inhibitors) must be held around the time of a procedure. This is not a comprehensive list of all of these drugs. Please review all of your medications and talk to your provider if you  take any one of these. If you are not sure, ask your provider.   LABS: drawn today (9/19)   FYI:  For your safety, and to allow us  to monitor your vital signs accurately during the surgery/procedure we request: If you have artificial  nails, gel coating, SNS etc, please have those removed prior to your surgery/procedure. Not having the nail coverings /polish removed may result in cancellation or delay of your surgery/procedure.  Your support person will be asked to wait in the waiting room during your procedure.  It is OK to have someone drop you off and come back when you are ready to be discharged.  You cannot drive after the procedure and will need someone to drive you home.  Bring your insurance cards.  *Special Note: Every effort is made to have your procedure done on time. Occasionally there are emergencies that occur at the hospital that may cause delays. Please be patient if a delay does occur.

## 2023-12-08 NOTE — Addendum Note (Signed)
 Addended byBETHA DARRYLE CURRIER on: 12/08/2023 04:00 PM   Modules accepted: Orders

## 2023-12-09 ENCOUNTER — Ambulatory Visit: Payer: Self-pay | Admitting: Cardiology

## 2023-12-11 NOTE — Progress Notes (Signed)
 Patient aware. Will move forward with testing.

## 2023-12-12 NOTE — Progress Notes (Signed)
 Called patient with pre-procedure instructions for tomorrow.   Patient informed of:   Time to arrive for procedure (0930). Remain NPO past midnight.  Must have a ride home and a responsible adult to remain with them for 24 hours post procedure. Instructed to take am meds with sip of water.  Patient reports understanding and no other questions at this time.

## 2023-12-13 ENCOUNTER — Other Ambulatory Visit: Payer: Self-pay

## 2023-12-13 ENCOUNTER — Ambulatory Visit (HOSPITAL_COMMUNITY): Admitting: Anesthesiology

## 2023-12-13 ENCOUNTER — Ambulatory Visit (HOSPITAL_COMMUNITY)
Admission: RE | Admit: 2023-12-13 | Discharge: 2023-12-13 | Disposition: A | Discharge status: Home / Self Care | Source: Ambulatory Visit | Attending: Cardiology

## 2023-12-13 ENCOUNTER — Encounter (HOSPITAL_COMMUNITY): Payer: Self-pay | Admitting: Internal Medicine

## 2023-12-13 ENCOUNTER — Ambulatory Visit (HOSPITAL_COMMUNITY)
Admission: RE | Admit: 2023-12-13 | Discharge: 2023-12-13 | Disposition: A | Discharge status: Home / Self Care | Attending: Internal Medicine | Admitting: Internal Medicine

## 2023-12-13 ENCOUNTER — Encounter (HOSPITAL_COMMUNITY)
Admission: RE | Disposition: A | Discharge status: Home / Self Care | Payer: Self-pay | Source: Home / Self Care | Attending: Internal Medicine

## 2023-12-13 DIAGNOSIS — I10 Essential (primary) hypertension: Secondary | ICD-10-CM | POA: Insufficient documentation

## 2023-12-13 DIAGNOSIS — F17210 Nicotine dependence, cigarettes, uncomplicated: Secondary | ICD-10-CM | POA: Insufficient documentation

## 2023-12-13 DIAGNOSIS — Z79899 Other long term (current) drug therapy: Secondary | ICD-10-CM | POA: Insufficient documentation

## 2023-12-13 DIAGNOSIS — I340 Nonrheumatic mitral (valve) insufficiency: Secondary | ICD-10-CM

## 2023-12-13 DIAGNOSIS — I341 Nonrheumatic mitral (valve) prolapse: Secondary | ICD-10-CM | POA: Diagnosis not present

## 2023-12-13 DIAGNOSIS — G4733 Obstructive sleep apnea (adult) (pediatric): Secondary | ICD-10-CM | POA: Diagnosis not present

## 2023-12-13 DIAGNOSIS — E785 Hyperlipidemia, unspecified: Secondary | ICD-10-CM | POA: Insufficient documentation

## 2023-12-13 HISTORY — PX: TRANSESOPHAGEAL ECHOCARDIOGRAM (CATH LAB): EP1270

## 2023-12-13 LAB — ECHO TEE

## 2023-12-13 SURGERY — TRANSESOPHAGEAL ECHOCARDIOGRAM (TEE) (CATHLAB)
Anesthesia: Monitor Anesthesia Care

## 2023-12-13 MED ORDER — EPHEDRINE SULFATE-NACL 50-0.9 MG/10ML-% IV SOSY
PREFILLED_SYRINGE | INTRAVENOUS | Status: DC | PRN
  Administered 2023-12-13 (×2): 10 mg via INTRAVENOUS

## 2023-12-13 MED ORDER — PROPOFOL 10 MG/ML IV BOLUS
INTRAVENOUS | Status: DC | PRN
  Administered 2023-12-13: 20 mg via INTRAVENOUS
  Administered 2023-12-13: 50 mg via INTRAVENOUS
  Administered 2023-12-13: 20 mg via INTRAVENOUS

## 2023-12-13 MED ORDER — LIDOCAINE 2% (20 MG/ML) 5 ML SYRINGE
INTRAMUSCULAR | Status: DC | PRN
  Administered 2023-12-13: 100 mg via INTRAVENOUS

## 2023-12-13 MED ORDER — PHENYLEPHRINE 80 MCG/ML (10ML) SYRINGE FOR IV PUSH (FOR BLOOD PRESSURE SUPPORT)
PREFILLED_SYRINGE | INTRAVENOUS | Status: DC | PRN
  Administered 2023-12-13 (×2): 160 ug via INTRAVENOUS

## 2023-12-13 MED ORDER — SODIUM CHLORIDE 0.9 % IV SOLN: INTRAVENOUS | Status: DC

## 2023-12-13 MED ORDER — PROPOFOL 500 MG/50ML IV EMUL
INTRAVENOUS | Status: DC | PRN
  Administered 2023-12-13: 80 ug/kg/min via INTRAVENOUS

## 2023-12-13 NOTE — Interval H&P Note (Signed)
 History and Physical Interval Note:  12/13/2023 11:34 AM  Jonathon Walker  has presented today for surgery, with the diagnosis of MITRAL VALVE REGURGITATION.  The various methods of treatment have been discussed with the patient and family. After consideration of risks, benefits and other options for treatment, the patient has consented to  Procedure(s): TRANSESOPHAGEAL ECHOCARDIOGRAM (N/A) as a surgical intervention.  The patient's history has been reviewed, patient examined, no change in status, stable for surgery.  I have reviewed the patient's chart and labs.  Questions were answered to the patient's satisfaction.     Velton Roselle A Haydan Wedig

## 2023-12-13 NOTE — Anesthesia Preprocedure Evaluation (Signed)
 Anesthesia Evaluation  Patient identified by MRN, date of birth, ID band Patient awake    Reviewed: Allergy & Precautions, NPO status , Patient's Chart, lab work & pertinent test results  History of Anesthesia Complications Negative for: history of anesthetic complications  Airway Mallampati: III  TM Distance: >3 FB Neck ROM: Full    Dental  (+) Dental Advisory Given, Teeth Intact   Pulmonary neg shortness of breath, sleep apnea and Continuous Positive Airway Pressure Ventilation , neg COPD, neg recent URI, Current Smoker and Patient abstained from smoking.   breath sounds clear to auscultation       Cardiovascular hypertension, Pt. on medications (-) angina (-) Past MI  Rhythm:Regular   1. Left ventricular ejection fraction, by estimation, is 60 to 65%. Left  ventricular ejection fraction by 3D volume is 61 %. The left ventricle has  normal function. The left ventricle has no regional wall motion  abnormalities. Left ventricular diastolic   parameters were normal. The average left ventricular global longitudinal  strain is -20.2 %. The global longitudinal strain is normal.   2. Right ventricular systolic function is normal. The right ventricular  size is normal. There is normal pulmonary artery systolic pressure. The  estimated right ventricular systolic pressure is 27.6 mmHg.   3. The mitral valve is myxomatous. Moderate to severe mitral valve  regurgitation. No evidence of mitral stenosis. There is moderate  holosystolic prolapse of the middle scallop of the posterior leaflet of  the mitral valve.   4. The aortic valve is tricuspid. Aortic valve regurgitation is not  visualized. No aortic stenosis is present.   5. The inferior vena cava is normal in size with greater than 50%  respiratory variability, suggesting right atrial pressure of 3 mmHg.     Neuro/Psych  PSYCHIATRIC DISORDERS Anxiety Depression    negative  neurological ROS     GI/Hepatic negative GI ROS, Neg liver ROS,,,  Endo/Other  negative endocrine ROS    Renal/GU negative Renal ROS     Musculoskeletal negative musculoskeletal ROS (+)    Abdominal   Peds  Hematology negative hematology ROS (+)   Anesthesia Other Findings   Reproductive/Obstetrics                              Anesthesia Physical Anesthesia Plan  ASA: 2  Anesthesia Plan: MAC   Post-op Pain Management: Minimal or no pain anticipated   Induction: Intravenous  PONV Risk Score and Plan: 0 and Propofol  infusion and Treatment may vary due to age or medical condition  Airway Management Planned: Nasal Cannula, Natural Airway and Simple Face Mask  Additional Equipment: None  Intra-op Plan:   Post-operative Plan:   Informed Consent: I have reviewed the patients History and Physical, chart, labs and discussed the procedure including the risks, benefits and alternatives for the proposed anesthesia with the patient or authorized representative who has indicated his/her understanding and acceptance.     Dental advisory given  Plan Discussed with: CRNA  Anesthesia Plan Comments:          Anesthesia Quick Evaluation

## 2023-12-13 NOTE — Transfer of Care (Signed)
 Immediate Anesthesia Transfer of Care Note  Patient: Jonathon Walker  Procedure(s) Performed: TRANSESOPHAGEAL ECHOCARDIOGRAM  Patient Location: PACU  Anesthesia Type:MAC  Level of Consciousness: drowsy  Airway & Oxygen Therapy: Patient Spontanous Breathing and Patient connected to nasal cannula oxygen  Post-op Assessment: Report given to RN and Post -op Vital signs reviewed and stable  Post vital signs: Reviewed and stable  Last Vitals:  Vitals Value Taken Time  BP    Temp    Pulse 79 12/13/23 12:18  Resp 14 12/13/23 12:18  SpO2 92 % 12/13/23 12:18  Vitals shown include unfiled device data.  Last Pain:  Vitals:   12/13/23 0947  TempSrc:   PainSc: 0-No pain         Complications: No notable events documented.

## 2023-12-13 NOTE — CV Procedure (Signed)
 INDICATIONS: MR  PROCEDURE:   Informed consent was obtained prior to the procedure. The risks, benefits and alternatives for the procedure were discussed and the patient comprehended these risks.  Risks include, but are not limited to, cough, sore throat, vomiting, nausea, somnolence, esophageal and stomach trauma or perforation, bleeding, low blood pressure, aspiration, pneumonia, infection, trauma to the teeth and death.    Procedural time out performed.  During this procedure the patient was administered propofol  per anesthesia.  The patient's heart rate, blood pressure, and oxygen saturation were monitored continuously during the procedure. The period of conscious sedation was 30 minutes, of which I was present face-to-face 100% of this time.  The transesophageal probe was inserted in the esophagus and stomach without difficulty and multiple views were obtained.  The patient was kept under observation until the patient left the procedure room.  The patient left the procedure room in stable condition.   Agitated microbubble saline contrast was administered.  COMPLICATIONS:    There were no immediate complications.  FINDINGS:   FORMAL ECHOCARDIOGRAM REPORT PENDING Severe MR with posterior leaflet prolapse of the P3 scallop with flail segment likely torn chords.  Normal biventricular function. Trivial TR Normal aortic valve. No PFO.  RECOMMENDATIONS:    Dc when alert. F/u 10/16.    Time Spent Directly with the Patient:  45 minutes   Bexley Mclester A Maitlyn Penza 12/13/2023, 12:15 PM

## 2023-12-15 ENCOUNTER — Ambulatory Visit: Payer: Self-pay | Admitting: Physician Assistant

## 2023-12-15 NOTE — Anesthesia Postprocedure Evaluation (Signed)
 Anesthesia Post Note  Patient: Jonathon Walker  Procedure(s) Performed: TRANSESOPHAGEAL ECHOCARDIOGRAM     Patient location during evaluation: Cath Lab Anesthesia Type: MAC Level of consciousness: awake and alert Pain management: pain level controlled Vital Signs Assessment: post-procedure vital signs reviewed and stable Respiratory status: spontaneous breathing, nonlabored ventilation, respiratory function stable and patient connected to nasal cannula oxygen Cardiovascular status: stable and blood pressure returned to baseline Postop Assessment: no apparent nausea or vomiting Anesthetic complications: no   No notable events documented.  Last Vitals:  Vitals:   12/13/23 1230 12/13/23 1240  BP: 130/66 (!) 145/81  Pulse: 66 67  Resp: 18 (!) 21  Temp:  36.7 C  SpO2: 92% 94%    Last Pain:  Vitals:   12/13/23 1240  TempSrc: Temporal  PainSc:                  [name] S

## 2023-12-15 NOTE — Progress Notes (Signed)
Called patient no answer, left a vm to call back

## 2023-12-25 ENCOUNTER — Telehealth: Payer: Self-pay | Admitting: Psychiatry

## 2023-12-25 MED ORDER — VRAYLAR 4.5 MG PO CAPS: 1.0000 | ORAL_CAPSULE | Freq: Every day | ORAL | 0 refills | Status: DC

## 2023-12-25 NOTE — Telephone Encounter (Signed)
 Pt said he needs one more week of Vraylar  3 mg samples. Did not have 3, but pulled enough 1.5 mg to double up. Rx sent for 4.5 mg.

## 2023-12-25 NOTE — Telephone Encounter (Signed)
 Jonathon Walker called to say that he has been taking samples of Vraylar . He is currently taking 3.0mg  and needs to go up to 4.5mg  . Can we send in an RX for him? Please send to: [facility] [address]

## 2023-12-26 ENCOUNTER — Telehealth: Payer: Self-pay | Admitting: Psychiatry

## 2023-12-26 NOTE — Telephone Encounter (Signed)
 Yesterday pt wanted script for Vraylar  4.5 mg sent in. He has been on 3 mg for a week, had enough samples for one more week and wanted to pick up another week of samples, was supposed to take for 3 weeks before deciding if dose should be increased to 4.5. Today he called to report jerking in his legs and pain. He reported he had noticed toe tapping previously, but felt he could live with it. Reported the jerking was bad last night, he was unable to relax and had trouble sleeping. Reporting the jerking also in his arms, but worse in his legs. He has had these complaints previously and is relating them to Vraylar .

## 2023-12-26 NOTE — Telephone Encounter (Signed)
 Pt left vm saying he is having some intolerable leg jerking and pain. Wants to speak with Dr. Geoffry about it.

## 2023-12-26 NOTE — Telephone Encounter (Signed)
Recommendations reviewed with patient.

## 2023-12-26 NOTE — Telephone Encounter (Signed)
 Stop Vraylar  and wait until SE resolve.  Then resume lower dose of 3 mg daily until appt.

## 2024-01-03 ENCOUNTER — Telehealth: Payer: Self-pay | Admitting: *Deleted

## 2024-01-03 NOTE — Progress Notes (Deleted)
  Cardiology Office Note:  .   Date:  01/03/2024  ID:  Jonathon Walker, DOB 04-16-1965, MRN [id number] PCP: Merna Huxley, NP  [facility] Providers Cardiologist:  Wilbert Bihari, MD {  History of Present Illness: .   Jonathon Walker is a 58 y.o. male with history of  mitral regurgitation, previous alcohol abuse, anxiety, depression, bipolar, hyperlipidemia, hypertension, OSA on CPAP.      Mitral regurgitation Echo 07/2019 preserved biventricular function.  Mild to moderate MR.  Severe asymmetric LVH. Lost in follow-up Echo 11/2023 preserved biventricular function.  MV myxomatous, moderate to severe MR.  Prolapse of the middle scallop leaflet.  No LVH noted. 11/2023 TEE with myxomatous posterior leaflet at the MV with P3 scallop flail and prolapse.  Severe MR with blunting of systolic pulmonary vein flow with intermittent systolic reversal.   Social history  On disability due to depression 1.5 pack/day smoker Previous alcohol abuse quit 2020 Mother lives in [place] . Single with no kids.     Patient with progressive, now severe mitral regurgitation status post recent TEE demonstrating myxomatous severe MR with prolapse.  Plans are for right and left heart catheterization and referral to CT surgery to discuss atrial valve placement.  Severe MR TEE demonstrating prolapse of the P3 scallop leaflet with blunting of systolic pulmonary vein flow with intermittent systolic reversal. Plan for right and left heart catheterization and referral to CT surgery to decide if he is a surgical candidate. Already has recent BMP and CBC. Place orders and consent  LVH Some discrepancy between readings in 2020 and less recent echocardiogram.  Per Dr. Bihari no further workup recommended at this point.  OSA Compliant with CPAP.   Hypertension Well-controlled.  Continue with amlodipine  5 mg daily.   Hyperlipidemia Continue atorvastatin  40 mg.  LDL 05/2023 was 79.   Depression Debilitating  symptoms.  Follows with psychiatry.  ROS: Denies: Chest pain, shortness of breath, orthopnea, peripheral edema, palpitations, decreased exercise intolerance, fatigue, lightheadedness.   Studies Reviewed: .         Risk Assessment/Calculations:   {Does this patient have ATRIAL FIBRILLATION?:[id number]} No BP recorded.  {Refresh Note OR Click here to enter BP  :1}***       Physical Exam:   VS:  There were no vitals taken for this visit.   Wt Readings from Last 3 Encounters:  12/08/23 294 lb (133.4 kg)  10/19/23 284 lb 6.4 oz (129 kg)  05/26/23 281 lb (127.5 kg)    GEN: Well nourished, well developed in no acute distress NECK: No JVD; No carotid bruits CARDIAC: ***RRR, no murmurs, rubs, gallops RESPIRATORY:  Clear to auscultation without rales, wheezing or rhonchi  ABDOMEN: Soft, non-tender, non-distended EXTREMITIES:  No edema; No deformity   ASSESSMENT AND PLAN: .         {Are you ordering a CV Procedure (e.g. stress test, cath, DCCV, TEE, etc)?   Press F2        :[id number]}  Dispo: ***  Signed, Thom LITTIE Sluder, PA-C

## 2024-01-03 NOTE — Telephone Encounter (Signed)
 Left message for pt to call back, need to offer him the 8:25 AM slot tomorrow on Angela Duke's schedule due to clinic pilot.

## 2024-01-03 NOTE — Progress Notes (Deleted)
 Cardiology Office Note:    Date:  01/03/2024   ID:  [name], DOB [birth year], MRN [id number]  PCP:  Merna Huxley, NP   [facility] Providers Cardiologist:  Wilbert Bihari, MD { Click to update primary MD,subspecialty MD or APP then REFRESH:1}    Referring MD: Merna Huxley, NP   No chief complaint on file. ***  History of Present Illness:    Jonathon Walker is a 58 y.o. male with a hx of ***  Past Medical History:  Diagnosis Date   Alcohol abuse    Anxiety    Bimalleolar fracture of left ankle    Depression    Heart murmur    HLD (hyperlipidemia)    HTN (hypertension)    MVP (mitral valve prolapse)    moderate MVP of the middle scallop of the posterior MVL with moderate to severe MR echo 11/2023   Sleep apnea    uses CPAP nightly   Tobacco use    Vitamin D  deficiency     Past Surgical History:  Procedure Laterality Date   ORIF ANKLE FRACTURE Left 09/19/2017   Procedure: OPEN REDUCTION INTERNAL FIXATION (ORIF) ANKLE FRACTURE;  Surgeon: Beverley Evalene JONETTA, MD;  Location: [facility];  Service: Orthopedics;  Laterality: Left;   TOOTH EXTRACTION     TRANSESOPHAGEAL ECHOCARDIOGRAM (CATH LAB) N/A 12/13/2023   Procedure: TRANSESOPHAGEAL ECHOCARDIOGRAM;  Surgeon: Loni Soyla LABOR, MD;  Location: [facility] INVASIVE CV LAB;  Service: Cardiovascular;  Laterality: N/A;    Current Medications: No outpatient medications have been marked as taking for the 01/04/24 encounter (Appointment) with Madie Jon Garre, PA.     Allergies:   Patient has no known allergies.   Social History   Socioeconomic History   Marital status: Single    Spouse name: Not on file   Number of children: Not on file   Years of education: Not on file   Highest education level: Some college, no degree  Occupational History   Not on file  Tobacco Use   Smoking status: Every Day    Current packs/day: 1.50    Average packs/day: 1.5 packs/day for 48.8 years (73.2 ttl pk-yrs)     Types: Cigarettes    Start date: 03/22/1975   Smokeless tobacco: Never   Tobacco comments:    He reports he has quit 3 times and recently started back in 2012. Hsm     1/2 PPD  Vaping Use   Vaping status: Never Used  Substance and Sexual Activity   Alcohol use: Not Currently   Drug use: No   Sexual activity: Not on file  Other Topics Concern   Not on file  Social History Narrative   Not on file   Social Drivers of Health   Financial Resource Strain: Low Risk  (04/25/2023)   Overall Financial Resource Strain (CARDIA)    Difficulty of Paying Living Expenses: Not very hard  Recent Concern: Financial Resource Strain - Medium Risk (02/14/2023)   Overall Financial Resource Strain (CARDIA)    Difficulty of Paying Living Expenses: Somewhat hard  Food Insecurity: No Food Insecurity (04/25/2023)   Hunger Vital Sign    Worried About Running Out of Food in the Last Year: Never true    Ran Out of Food in the Last Year: Never true  Transportation Needs: No Transportation Needs (04/25/2023)   PRAPARE - Administrator, Civil Service (Medical): No    Lack of Transportation (Non-Medical): No  Physical Activity: Inactive (  04/25/2023)   Exercise Vital Sign    Days of Exercise per Week: 0 days    Minutes of Exercise per Session: 10 min  Stress: Stress Concern Present (04/25/2023)   [employer] of Occupational Health - Occupational Stress Questionnaire    Feeling of Stress : Very much  Social Connections: Moderately Isolated (04/25/2023)   Social Connection and Isolation Panel    Frequency of Communication with Friends and Family: Three times a week    Frequency of Social Gatherings with Friends and Family: Once a week    Attends Religious Services: Never    Database administrator or Organizations: Yes    Attends Engineer, structural: More than 4 times per year    Marital Status: Never married     Family History: The patient's ***family history includes Cancer in his  maternal grandfather. There is no history of Colon cancer, Colon polyps, Esophageal cancer, Rectal cancer, or Stomach cancer.  ROS:   Please see the history of present illness.    *** All other systems reviewed and are negative.  EKGs/Labs/Other Studies Reviewed:    The following studies were reviewed today: ***      Recent Labs: 05/26/2023: ALT 39; TSH 2.17 12/08/2023: BUN 19; Creatinine, Ser 1.15; Hemoglobin 15.3; Platelets 211; Potassium 4.0; Sodium 143  Recent Lipid Panel    Component Value Date/Time   CHOL 137 05/26/2023 1113   TRIG 116.0 05/26/2023 1113   HDL 34.90 (L) 05/26/2023 1113   CHOLHDL 4 05/26/2023 1113   VLDL 23.2 05/26/2023 1113   LDLCALC 79 05/26/2023 1113   LDLDIRECT 204.0 05/31/2018 1100     Risk Assessment/Calculations:   {Does this patient have ATRIAL FIBRILLATION?:[id number]}  No BP recorded.  {Refresh Note OR Click here to enter BP  :1}***         Physical Exam:    VS:  There were no vitals taken for this visit.    Wt Readings from Last 3 Encounters:  12/08/23 294 lb (133.4 kg)  10/19/23 284 lb 6.4 oz (129 kg)  05/26/23 281 lb (127.5 kg)     GEN: *** Well nourished, well developed in no acute distress HEENT: Normal NECK: No JVD; No carotid bruits LYMPHATICS: No lymphadenopathy CARDIAC: ***RRR, no murmurs, rubs, gallops RESPIRATORY:  Clear to auscultation without rales, wheezing or rhonchi  ABDOMEN: Soft, non-tender, non-distended MUSCULOSKELETAL:  No edema; No deformity  SKIN: Warm and dry NEUROLOGIC:  Alert and oriented x 3 PSYCHIATRIC:  Normal affect   ASSESSMENT:    No diagnosis found. PLAN:    In order of problems listed above:  ***      {Are you ordering a CV Procedure (e.g. stress test, cath, DCCV, TEE, etc)?   Press F2        :[id number]}    Medication Adjustments/Labs and Tests Ordered: Current medicines are reviewed at length with the patient today.  Concerns regarding medicines are outlined above.  No orders of the  defined types were placed in this encounter.  No orders of the defined types were placed in this encounter.   There are no Patient Instructions on file for this visit.   Signed, Jon Nat Hails, [place]  01/03/2024 9:39 AM    [facility]

## 2024-01-04 ENCOUNTER — Ambulatory Visit: Admitting: Physician Assistant

## 2024-01-04 NOTE — Telephone Encounter (Signed)
 Mother ([place] ) returned staff call.

## 2024-01-04 NOTE — Telephone Encounter (Signed)
 Patient rescheduled.

## 2024-01-17 NOTE — Progress Notes (Unsigned)
 Cardiology Office Note:    Date:  01/18/2024   ID:  Jonathon Walker, DOB Dec 25, 1965, MRN [id number]  PCP:  Merna Huxley, NP   [facility] Providers Cardiologist:  Wilbert Bihari, MD Cardiology APP:  Madie Jon Garre, PA     Referring MD: Merna Huxley, NP   Chief Complaint  Patient presents with   Follow-up    MVR/MVP    History of Present Illness:    Jonathon Walker is a 58 y.o. male with a hx of severe MR by recent TEE, prior alcohol abuse (quit in 2020), tobacco abuse, anxiety/depression/BPD, HTN, HLD, and OSA on CPAP. Echo 2021 with mild to moderate MR, severe asymmetric septal hypertrophy, mild pulmonary HTN. Repeat echocardiogram 11/2023 showed progression of MR leading to cardiology referral and TEE 12/13/23 which confirmed myxomatous posterior leaflet of MV with P3 scallop flail and prolapse, severe MR.   He presents back to discuss right and left heart catheterization and referral to CT surgery. He reports no cardiac symptoms. 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 Currently on disability for MDD, but wants to get back to work. 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     Past Medical History:  Diagnosis Date   Alcohol abuse    Anxiety    Bimalleolar fracture of left ankle    Depression    Heart murmur    HLD (hyperlipidemia)    HTN (hypertension)    MVP (mitral valve prolapse)    moderate MVP of the middle scallop of the posterior MVL with moderate to severe MR echo 11/2023   Sleep apnea    uses CPAP nightly   Tobacco use    Vitamin D  deficiency     Past Surgical History:  Procedure Laterality Date   ORIF ANKLE FRACTURE Left 09/19/2017   Procedure: OPEN REDUCTION INTERNAL FIXATION (ORIF) ANKLE FRACTURE;  Surgeon: Beverley Evalene JONETTA, MD;  Location: [facility];  Service: Orthopedics;  Laterality: Left;   TOOTH EXTRACTION     TRANSESOPHAGEAL ECHOCARDIOGRAM (CATH LAB) N/A 12/13/2023   Procedure:  TRANSESOPHAGEAL ECHOCARDIOGRAM;  Surgeon: Loni Soyla LABOR, MD;  Location: [facility] INVASIVE CV LAB;  Service: Cardiovascular;  Laterality: N/A;    Current Medications: Current Meds  Medication Sig   amLODipine  (NORVASC ) 5 MG tablet Take 1 tablet (5 mg total) by mouth daily.   atorvastatin  (LIPITOR) 80 MG tablet Take 1 tablet (80 mg total) by mouth daily.   Cholecalciferol (VITAMIN D3) 125 MCG (5000 UT) CAPS Take 5,000 Units by mouth daily in the afternoon.   lithium  carbonate 300 MG capsule Take 3 capsules (900 mg total) by mouth at bedtime.   PARoxetine  (PAXIL ) 30 MG tablet Take 1 tablet (30 mg total) by mouth daily.   QUEtiapine  (SEROQUEL  XR) 400 MG 24 hr tablet Take 1 tablet (400 mg total) by mouth at bedtime.   [DISCONTINUED] atorvastatin  (LIPITOR) 40 MG tablet Take 1 tablet (40 mg total) by mouth daily.     Allergies:   Patient has no known allergies.   Social History   Socioeconomic History   Marital status: Single    Spouse name: Not on file   Number of children: Not on file   Years of education: Not on file   Highest education level: Some college, no degree  Occupational History   Not on file  Tobacco Use   Smoking status: Every Day    Current packs/day: 1.50    Average packs/day: 1.5  packs/day for 48.8 years (73.2 ttl pk-yrs)    Types: Cigarettes    Start date: 03/22/1975   Smokeless tobacco: Never   Tobacco comments:    He reports he has quit 3 times and recently started back in 2012. Hsm     1/2 PPD  Vaping Use   Vaping status: Never Used  Substance and Sexual Activity   Alcohol use: Not Currently   Drug use: No   Sexual activity: Not on file  Other Topics Concern   Not on file  Social History Narrative   Not on file   Social Drivers of Health   Financial Resource Strain: Low Risk  (04/25/2023)   Overall Financial Resource Strain (CARDIA)    Difficulty of Paying Living Expenses: Not very hard  Recent Concern: Financial Resource Strain - Medium Risk  (02/14/2023)   Overall Financial Resource Strain (CARDIA)    Difficulty of Paying Living Expenses: Somewhat hard  Food Insecurity: No Food Insecurity (04/25/2023)   Hunger Vital Sign    Worried About Running Out of Food in the Last Year: Never true    Ran Out of Food in the Last Year: Never true  Transportation Needs: No Transportation Needs (04/25/2023)   PRAPARE - Administrator, Civil Service (Medical): No    Lack of Transportation (Non-Medical): No  Physical Activity: Inactive (04/25/2023)   Exercise Vital Sign    Days of Exercise per Week: 0 days    Minutes of Exercise per Session: 10 min  Stress: Stress Concern Present (04/25/2023)   [employer] of Occupational Health - Occupational Stress Questionnaire    Feeling of Stress : Very much  Social Connections: Moderately Isolated (04/25/2023)   Social Connection and Isolation Panel    Frequency of Communication with Friends and Family: Three times a week    Frequency of Social Gatherings with Friends and Family: Once a week    Attends Religious Services: Never    Database Administrator or Organizations: Yes    Attends Engineer, Structural: More than 4 times per year    Marital Status: Never married     Family History: The patient's family history includes Cancer in his maternal grandfather. There is no history of Colon cancer, Colon polyps, Esophageal cancer, Rectal cancer, or Stomach cancer.  ROS:   Please see the history of present illness.     All other systems reviewed and are negative.  EKGs/Labs/Other Studies Reviewed:    The following studies were reviewed today:  EKG Interpretation Date/Time:  Thursday January 18 2024 14:27:11 EDT Ventricular Rate:  85 PR Interval:  180 QRS Duration:  76 QT Interval:  382 QTC Calculation: 454 R Axis:   -3  Text Interpretation: Normal sinus rhythm Normal ECG When compared with ECG of 19-Oct-2023 13:08, No significant change was found Confirmed by Madie Slough  ([id number]) on 01/18/2024 2:32:23 PM    Recent Labs: 05/26/2023: ALT 39; TSH 2.17 12/08/2023: BUN 19; Creatinine, Ser 1.15; Hemoglobin 15.3; Platelets 211; Potassium 4.0; Sodium 143  Recent Lipid Panel    Component Value Date/Time   CHOL 137 05/26/2023 1113   TRIG 116.0 05/26/2023 1113   HDL 34.90 (L) 05/26/2023 1113   CHOLHDL 4 05/26/2023 1113   VLDL 23.2 05/26/2023 1113   LDLCALC 79 05/26/2023 1113   LDLDIRECT 204.0 05/31/2018 1100     Risk Assessment/Calculations:                Physical Exam:    VS:  BP 120/68 (BP Location: Left Arm)   Pulse 90   Ht 6' 3 (1.905 m)   Wt 296 lb 6.4 oz (134.4 kg)   SpO2 95%   BMI 37.05 kg/m     Wt Readings from Last 3 Encounters:  01/18/24 296 lb 6.4 oz (134.4 kg)  12/08/23 294 lb (133.4 kg)  10/19/23 284 lb 6.4 oz (129 kg)     GEN:  Well nourished, well developed in no acute distress HEENT: Normal NECK: No JVD; No carotid bruits LYMPHATICS: No lymphadenopathy CARDIAC: RRR 4/6 murmur heard throughout RESPIRATORY:  rhonchi throughout ABDOMEN: Soft, non-tender, non-distended MUSCULOSKELETAL:  No edema; No deformity  SKIN: Warm and dry NEUROLOGIC:  Alert and oriented x 3 PSYCHIATRIC:  Normal affect   ASSESSMENT:    1. Mitral valve insufficiency, unspecified etiology   2. OSA (obstructive sleep apnea)   3. Primary hypertension   4. Hyperlipidemia with target LDL less than 70   5. Preop cardiovascular exam    PLAN:    In order of problems listed above:  Severe MR with prolapse of P3 - confirmed with TEE - will proceed with R/L HC - appears euvolemic - I advised to hold off on strenuous activity (such as spreading mulch), but continue walking - draw labs today pre-cath - scheduled with Dr. Jordan second case Nov 6, encouraged him to keep his dental appt on Nov 4   OSA Compliant with CPAP.    Hypertension Well-controlled.  Continue with amlodipine  5 mg daily.    Hyperlipidemia with LDL goal < 70 Continue  atorvastatin  40 mg.  LDL 05/2023 was 79. Will increase lipitor to 80 mg.     Depression Debilitating symptoms.  Follows with psychiatry. Now on disability.   Tobacco abuse - smoked 1.5 ppd - strongly encouraged cessation - he will use gum, declined patch - will ultimately need to see pulmonology with COPD       Informed Consent   Shared Decision Making/Informed Consent The risks [stroke (1 in 1000), death (1 in 1000), kidney failure [usually temporary] (1 in 500), bleeding (1 in 200), allergic reaction [possibly serious] (1 in 200)], benefits (diagnostic support and management of coronary artery disease) and alternatives of a cardiac catheterization were discussed in detail with Mr. Harren and he is willing to proceed.      Medication Adjustments/Labs and Tests Ordered: Current medicines are reviewed at length with the patient today.  Concerns regarding medicines are outlined above.  Orders Placed This Encounter  Procedures   Basic Metabolic Panel (BMET)   CBC   EKG 12-Lead   Meds ordered this encounter  Medications   atorvastatin  (LIPITOR) 80 MG tablet    Sig: Take 1 tablet (80 mg total) by mouth daily.    Dispense:  90 tablet    Refill:  3    Patient Instructions  Medication Instructions:  Your physician recommends that you continue on your current medications as directed. Please refer to the Current Medication list given to you today.  *If you need a refill on your cardiac medications before your next appointment, please call your pharmacy*  Lab Work: BMET, CBC If you have labs (blood work) drawn today and your tests are completely normal, you will receive your results only by: MyChart Message (if you have MyChart) OR A paper copy in the mail If you have any lab test that is abnormal or we need to change your treatment, we will call you to review the results.  Testing/Procedures: Left  and Right Heart Catheterization  Follow-Up: At [facility], you and  your health needs are our priority.  As part of our continuing mission to provide you with exceptional heart care, our providers are all part of one team.  This team includes your primary Cardiologist (physician) and Advanced Practice Providers or APPs (Physician Assistants and Nurse Practitioners) who all work together to provide you with the care you need, when you need it.  Your next appointment:   2 week(s)  Provider:   Jon Hails, PA-C          We recommend signing up for the patient portal called MyChart.  Sign up information is provided on this After Visit Summary.  MyChart is used to connect with patients for Virtual Visits (Telemedicine).  Patients are able to view lab/test results, encounter notes, upcoming appointments, etc.  Non-urgent messages can be sent to your provider as well.   To learn more about what you can do with MyChart, go to [email or web address].   Other Instructions  We are referring you Cardio-Thoracic Surgery  [facility] A DEPT OF [place] [address] A DEPT OF THE [place] [address] [place] [address] Dept: [phone] Loc: [phone]  DESMOND SZABO  01/18/2024  You are scheduled for a Cardiac Catheterization on Thursday, November 6 with Dr. Peter Jordan.  1. Please arrive at the [facility] (Main Entrance A) at [facility]: [address] [place], [place] [address] at 7:00 AM (This time is 2 hour(s) before your procedure to ensure your preparation).   Free valet parking service is available. You will check in at ADMITTING. The support person will be asked to wait in the waiting room.  It is OK to have someone drop you off and come back when you are ready to be discharged.    Special note: Every effort is made to have your procedure done on time. Please understand that emergencies sometimes delay scheduled procedures.  2. Diet: Nothing to eat after midnight.   3.  Hydration: You need to be well hydrated before your procedure. On November 6, you may drink approved liquids (see below) until 2 hours before the procedure, with 16 oz of water as your last intake.   List of approved liquids water, clear juice, clear tea, black coffee, fruit juices, non-citric and without pulp, carbonated beverages, Gatorade, Kool -Aid, plain Jello-O and plain ice popsicles.  4. Labs: You will need to have blood drawn on Thursday, October 10 at [facility] (1st Floor), [address], [place], [place] [address]. You do not need to be fasting.  5. Medication instructions in preparation for your procedure:   Contrast Allergy: No    Current Outpatient Medications (Cardiovascular):    amLODipine  (NORVASC ) 5 MG tablet, Take 1 tablet (5 mg total) by mouth daily.   atorvastatin  (LIPITOR) 40 MG tablet, Take 1 tablet (40 mg total) by mouth daily.     Current Outpatient Medications (Other):    Cholecalciferol (VITAMIN D3) 125 MCG (5000 UT) CAPS, Take 5,000 Units by mouth daily in the afternoon.   lithium  carbonate 300 MG capsule, Take 3 capsules (900 mg total) by mouth at bedtime.   PARoxetine  (PAXIL ) 30 MG tablet, Take 1 tablet (30 mg total) by mouth daily.   QUEtiapine  (SEROQUEL  XR) 400 MG 24 hr tablet, Take 1 tablet (400 mg total) by mouth at  bedtime.   cariprazine  (VRAYLAR ) 1.5 MG capsule, Take 1.5 mg by mouth daily. (Patient not taking: Reported on 01/18/2024)   cariprazine  (VRAYLAR ) 3 MG capsule, Take 1 capsule (3 mg total) by mouth daily. (Patient not taking: Reported on 01/18/2024)   Cariprazine  HCl (VRAYLAR ) 4.5 MG CAPS, Take 1 capsule (4.5 mg total) by mouth daily. (Patient not taking: Reported on 01/18/2024)   NON FORMULARY, Pt uses a c-pap nightly *For reference purposes while preparing patient instructions.   Delete this med list prior to printing instructions for patient.*   On the morning of your procedure, take your  morning medicines.  You may use sips of water.  6. Plan to go home the same day, you will only stay overnight if medically necessary. 7. Bring a current list of your medications and current insurance cards. 8. You MUST have a responsible person to drive you home. 9. Someone MUST be with you the first 24 hours after you arrive home or your discharge will be delayed. 10. Please wear clothes that are easy to get on and off and wear slip-on shoes.  Thank you for allowing us  to care for you!   -- [facility] Invasive Cardiovascular services           Signed, Jon Nat Hails, [place]  01/18/2024 2:32 PM    [facility]

## 2024-01-17 NOTE — H&P (View-Only) (Signed)
 Cardiology Office Note:    Date:  01/18/2024   ID:  Jonathon Walker, DOB 20-Mar-1966, MRN [id number]  PCP:  Jonathon Huxley, NP   [facility] Providers Cardiologist:  Jonathon Bihari, MD Cardiology APP:  Jonathon Jon Garre, PA     Referring MD: Jonathon Huxley, NP   Chief Complaint  Patient presents with   Follow-up    MVR/MVP    History of Present Illness:    Jonathon Walker is a 58 y.o. male with a hx of severe MR by recent TEE, prior alcohol abuse (quit in 2020), tobacco abuse, anxiety/depression/BPD, HTN, HLD, and OSA on CPAP. Echo 2021 with mild to moderate MR, severe asymmetric septal hypertrophy, mild pulmonary HTN. Repeat echocardiogram 11/2023 showed progression of MR leading to cardiology referral and TEE 12/13/23 which confirmed myxomatous posterior leaflet of MV with P3 scallop flail and prolapse, severe MR.   He presents back to discuss right and left heart catheterization and referral to CT surgery. He reports no cardiac symptoms. 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 Currently on disability for MDD, but wants to get back to work. 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     Past Medical History:  Diagnosis Date   Alcohol abuse    Anxiety    Bimalleolar fracture of left ankle    Depression    Heart murmur    HLD (hyperlipidemia)    HTN (hypertension)    MVP (mitral valve prolapse)    moderate MVP of the middle scallop of the posterior MVL with moderate to severe MR echo 11/2023   Sleep apnea    uses CPAP nightly   Tobacco use    Vitamin D  deficiency     Past Surgical History:  Procedure Laterality Date   ORIF ANKLE FRACTURE Left 09/19/2017   Procedure: OPEN REDUCTION INTERNAL FIXATION (ORIF) ANKLE FRACTURE;  Surgeon: Jonathon Evalene JONETTA, MD;  Location: [facility];  Service: Orthopedics;  Laterality: Left;   TOOTH EXTRACTION     TRANSESOPHAGEAL ECHOCARDIOGRAM (CATH LAB) N/A 12/13/2023   Procedure:  TRANSESOPHAGEAL ECHOCARDIOGRAM;  Surgeon: Jonathon Soyla LABOR, MD;  Location: [facility] INVASIVE CV LAB;  Service: Cardiovascular;  Laterality: N/A;    Current Medications: Current Meds  Medication Sig   amLODipine  (NORVASC ) 5 MG tablet Take 1 tablet (5 mg total) by mouth daily.   atorvastatin  (LIPITOR) 80 MG tablet Take 1 tablet (80 mg total) by mouth daily.   Cholecalciferol (VITAMIN D3) 125 MCG (5000 UT) CAPS Take 5,000 Units by mouth daily in the afternoon.   lithium  carbonate 300 MG capsule Take 3 capsules (900 mg total) by mouth at bedtime.   PARoxetine  (PAXIL ) 30 MG tablet Take 1 tablet (30 mg total) by mouth daily.   QUEtiapine  (SEROQUEL  XR) 400 MG 24 hr tablet Take 1 tablet (400 mg total) by mouth at bedtime.   [DISCONTINUED] atorvastatin  (LIPITOR) 40 MG tablet Take 1 tablet (40 mg total) by mouth daily.     Allergies:   Patient has no known allergies.   Social History   Socioeconomic History   Marital status: Single    Spouse name: Not on file   Number of children: Not on file   Years of education: Not on file   Highest education level: Some college, no degree  Occupational History   Not on file  Tobacco Use   Smoking status: Every Day    Current packs/day: 1.50    Average packs/day: 1.5  packs/day for 48.8 years (73.2 ttl pk-yrs)    Types: Cigarettes    Start date: 03/22/1975   Smokeless tobacco: Never   Tobacco comments:    He reports he has quit 3 times and recently started back in 2012. Hsm     1/2 PPD  Vaping Use   Vaping status: Never Used  Substance and Sexual Activity   Alcohol use: Not Currently   Drug use: No   Sexual activity: Not on file  Other Topics Concern   Not on file  Social History Narrative   Not on file   Social Drivers of Health   Financial Resource Strain: Low Risk  (04/25/2023)   Overall Financial Resource Strain (CARDIA)    Difficulty of Paying Living Expenses: Not very hard  Recent Concern: Financial Resource Strain - Medium Risk  (02/14/2023)   Overall Financial Resource Strain (CARDIA)    Difficulty of Paying Living Expenses: Somewhat hard  Food Insecurity: No Food Insecurity (04/25/2023)   Hunger Vital Sign    Worried About Running Out of Food in the Last Year: Never true    Ran Out of Food in the Last Year: Never true  Transportation Needs: No Transportation Needs (04/25/2023)   PRAPARE - Administrator, Civil Service (Medical): No    Lack of Transportation (Non-Medical): No  Physical Activity: Inactive (04/25/2023)   Exercise Vital Sign    Days of Exercise per Week: 0 days    Minutes of Exercise per Session: 10 min  Stress: Stress Concern Present (04/25/2023)   [employer] of Occupational Health - Occupational Stress Questionnaire    Feeling of Stress : Very much  Social Connections: Moderately Isolated (04/25/2023)   Social Connection and Isolation Panel    Frequency of Communication with Friends and Family: Three times a week    Frequency of Social Gatherings with Friends and Family: Once a week    Attends Religious Services: Never    Database Administrator or Organizations: Yes    Attends Engineer, Structural: More than 4 times per year    Marital Status: Never married     Family History: The patient's family history includes Cancer in his maternal grandfather. There is no history of Colon cancer, Colon polyps, Esophageal cancer, Rectal cancer, or Stomach cancer.  ROS:   Please see the history of present illness.     All other systems reviewed and are negative.  EKGs/Labs/Other Studies Reviewed:    The following studies were reviewed today:  EKG Interpretation Date/Time:  Thursday January 18 2024 14:27:11 EDT Ventricular Rate:  85 PR Interval:  180 QRS Duration:  76 QT Interval:  382 QTC Calculation: 454 R Axis:   -3  Text Interpretation: Normal sinus rhythm Normal ECG When compared with ECG of 19-Oct-2023 13:08, No significant change was found Confirmed by Jonathon Walker  ([id number]) on 01/18/2024 2:32:23 PM    Recent Labs: 05/26/2023: ALT 39; TSH 2.17 12/08/2023: BUN 19; Creatinine, Ser 1.15; Hemoglobin 15.3; Platelets 211; Potassium 4.0; Sodium 143  Recent Lipid Panel    Component Value Date/Time   CHOL 137 05/26/2023 1113   TRIG 116.0 05/26/2023 1113   HDL 34.90 (L) 05/26/2023 1113   CHOLHDL 4 05/26/2023 1113   VLDL 23.2 05/26/2023 1113   LDLCALC 79 05/26/2023 1113   LDLDIRECT 204.0 05/31/2018 1100     Risk Assessment/Calculations:                Physical Exam:    VS:  BP 120/68 (BP Location: Left Arm)   Pulse 90   Ht 6' 3 (1.905 m)   Wt 296 lb 6.4 oz (134.4 kg)   SpO2 95%   BMI 37.05 kg/m     Wt Readings from Last 3 Encounters:  01/18/24 296 lb 6.4 oz (134.4 kg)  12/08/23 294 lb (133.4 kg)  10/19/23 284 lb 6.4 oz (129 kg)     GEN:  Well nourished, well developed in no acute distress HEENT: Normal NECK: No JVD; No carotid bruits LYMPHATICS: No lymphadenopathy CARDIAC: RRR 4/6 murmur heard throughout RESPIRATORY:  rhonchi throughout ABDOMEN: Soft, non-tender, non-distended MUSCULOSKELETAL:  No edema; No deformity  SKIN: Warm and dry NEUROLOGIC:  Alert and oriented x 3 PSYCHIATRIC:  Normal affect   ASSESSMENT:    1. Mitral valve insufficiency, unspecified etiology   2. OSA (obstructive sleep apnea)   3. Primary hypertension   4. Hyperlipidemia with target LDL less than 70   5. Preop cardiovascular exam    PLAN:    In order of problems listed above:  Severe MR with prolapse of P3 - confirmed with TEE - will proceed with R/L HC - appears euvolemic - I advised to hold off on strenuous activity (such as spreading mulch), but continue walking - draw labs today pre-cath - scheduled with Dr. Jordan second case Nov 6, encouraged him to keep his dental appt on Nov 4   OSA Compliant with CPAP.    Hypertension Well-controlled.  Continue with amlodipine  5 mg daily.    Hyperlipidemia with LDL goal < 70 Continue  atorvastatin  40 mg.  LDL 05/2023 was 79. Will increase lipitor to 80 mg.     Depression Debilitating symptoms.  Follows with psychiatry. Now on disability.   Tobacco abuse - smoked 1.5 ppd - strongly encouraged cessation - he will use gum, declined patch - will ultimately need to see pulmonology with COPD       Informed Consent   Shared Decision Making/Informed Consent The risks [stroke (1 in 1000), death (1 in 1000), kidney failure [usually temporary] (1 in 500), bleeding (1 in 200), allergic reaction [possibly serious] (1 in 200)], benefits (diagnostic support and management of coronary artery disease) and alternatives of a cardiac catheterization were discussed in detail with Mr. Todorov and he is willing to proceed.      Medication Adjustments/Labs and Tests Ordered: Current medicines are reviewed at length with the patient today.  Concerns regarding medicines are outlined above.  Orders Placed This Encounter  Procedures   Basic Metabolic Panel (BMET)   CBC   EKG 12-Lead   Meds ordered this encounter  Medications   atorvastatin  (LIPITOR) 80 MG tablet    Sig: Take 1 tablet (80 mg total) by mouth daily.    Dispense:  90 tablet    Refill:  3    Patient Instructions  Medication Instructions:  Your physician recommends that you continue on your current medications as directed. Please refer to the Current Medication list given to you today.  *If you need a refill on your cardiac medications before your next appointment, please call your pharmacy*  Lab Work: BMET, CBC If you have labs (blood work) drawn today and your tests are completely normal, you will receive your results only by: MyChart Message (if you have MyChart) OR A paper copy in the mail If you have any lab test that is abnormal or we need to change your treatment, we will call you to review the results.  Testing/Procedures: Left  and Right Heart Catheterization  Follow-Up: At [facility], you and  your health needs are our priority.  As part of our continuing mission to provide you with exceptional heart care, our providers are all part of one team.  This team includes your primary Cardiologist (physician) and Advanced Practice Providers or APPs (Physician Assistants and Nurse Practitioners) who all work together to provide you with the care you need, when you need it.  Your next appointment:   2 week(s)  Provider:   Jon Hails, PA-C          We recommend signing up for the patient portal called MyChart.  Sign up information is provided on this After Visit Summary.  MyChart is used to connect with patients for Virtual Visits (Telemedicine).  Patients are able to view lab/test results, encounter notes, upcoming appointments, etc.  Non-urgent messages can be sent to your provider as well.   To learn more about what you can do with MyChart, go to [email or web address].   Other Instructions  We are referring you Cardio-Thoracic Surgery  [facility] A DEPT OF [place] AT [address] A DEPT OF THE [place] [place] [address] [place] [address] Dept: [phone] Loc: [phone]  ASHER TORPEY  01/18/2024  You are scheduled for a Cardiac Catheterization on Thursday, November 6 with Dr. Peter Jordan.  1. Please arrive at the [facility] (Main Entrance A) at [facility]: [address] [place], [place] [address] at 7:00 AM (This time is 2 hour(s) before your procedure to ensure your preparation).   Free valet parking service is available. You will check in at ADMITTING. The support person will be asked to wait in the waiting room.  It is OK to have someone drop you off and come back when you are ready to be discharged.    Special note: Every effort is made to have your procedure done on time. Please understand that emergencies sometimes delay scheduled procedures.  2. Diet: Nothing to eat after midnight.   3.  Hydration: You need to be well hydrated before your procedure. On November 6, you may drink approved liquids (see below) until 2 hours before the procedure, with 16 oz of water as your last intake.   List of approved liquids water, clear juice, clear tea, black coffee, fruit juices, non-citric and without pulp, carbonated beverages, Gatorade, Kool -Aid, plain Jello-O and plain ice popsicles.  4. Labs: You will need to have blood drawn on Thursday, October 10 at [facility] (1st Floor), [address] [place], [place] [address]. You do not need to be fasting.  5. Medication instructions in preparation for your procedure:   Contrast Allergy: No    Current Outpatient Medications (Cardiovascular):    amLODipine  (NORVASC ) 5 MG tablet, Take 1 tablet (5 mg total) by mouth daily.   atorvastatin  (LIPITOR) 40 MG tablet, Take 1 tablet (40 mg total) by mouth daily.     Current Outpatient Medications (Other):    Cholecalciferol (VITAMIN D3) 125 MCG (5000 UT) CAPS, Take 5,000 Units by mouth daily in the afternoon.   lithium  carbonate 300 MG capsule, Take 3 capsules (900 mg total) by mouth at bedtime.   PARoxetine  (PAXIL ) 30 MG tablet, Take 1 tablet (30 mg total) by mouth daily.   QUEtiapine  (SEROQUEL  XR) 400 MG 24 hr tablet, Take 1 tablet (400 mg total) by mouth at  bedtime.   cariprazine  (VRAYLAR ) 1.5 MG capsule, Take 1.5 mg by mouth daily. (Patient not taking: Reported on 01/18/2024)   cariprazine  (VRAYLAR ) 3 MG capsule, Take 1 capsule (3 mg total) by mouth daily. (Patient not taking: Reported on 01/18/2024)   Cariprazine  HCl (VRAYLAR ) 4.5 MG CAPS, Take 1 capsule (4.5 mg total) by mouth daily. (Patient not taking: Reported on 01/18/2024)   NON FORMULARY, Pt uses a c-pap nightly *For reference purposes while preparing patient instructions.   Delete this med list prior to printing instructions for patient.*   On the morning of your procedure, take your  morning medicines.  You may use sips of water.  6. Plan to go home the same day, you will only stay overnight if medically necessary. 7. Bring a current list of your medications and current insurance cards. 8. You MUST have a responsible person to drive you home. 9. Someone MUST be with you the first 24 hours after you arrive home or your discharge will be delayed. 10. Please wear clothes that are easy to get on and off and wear slip-on shoes.  Thank you for allowing us  to care for you!   -- [facility] Cardiovascular services           Signed, Jon Nat Hails, [place]  01/18/2024 2:32 PM    [facility]

## 2024-01-18 ENCOUNTER — Encounter: Payer: Self-pay | Admitting: Physician Assistant

## 2024-01-18 ENCOUNTER — Telehealth: Payer: Self-pay

## 2024-01-18 ENCOUNTER — Ambulatory Visit: Attending: Internal Medicine | Admitting: Physician Assistant

## 2024-01-18 VITALS — BP 120/68 | HR 90 | Ht 75.0 in | Wt 296.4 lb

## 2024-01-18 DIAGNOSIS — Z01810 Encounter for preprocedural cardiovascular examination: Secondary | ICD-10-CM

## 2024-01-18 DIAGNOSIS — I340 Nonrheumatic mitral (valve) insufficiency: Secondary | ICD-10-CM | POA: Diagnosis not present

## 2024-01-18 DIAGNOSIS — E785 Hyperlipidemia, unspecified: Secondary | ICD-10-CM

## 2024-01-18 DIAGNOSIS — G4733 Obstructive sleep apnea (adult) (pediatric): Secondary | ICD-10-CM

## 2024-01-18 DIAGNOSIS — I10 Essential (primary) hypertension: Secondary | ICD-10-CM | POA: Diagnosis not present

## 2024-01-18 MED ORDER — ATORVASTATIN CALCIUM 80 MG PO TABS
80.0000 mg | ORAL_TABLET | Freq: Every day | ORAL | 3 refills | Status: AC
Start: 2024-01-18 — End: 2024-04-17

## 2024-01-18 NOTE — Telephone Encounter (Signed)
 Upon chart review after clinic, noticed patients follow up to L and R heart cath was scheduled too close to procedure. Contact patient to reschedule. Follow up changed from 02/01/24 to 02/13/24. Patient acknowledged understanding.

## 2024-01-18 NOTE — Patient Instructions (Addendum)
 Medication Instructions:  INCREASE Atorvastatin  (Lipitor) to 80 mg, take one (1) tablet by mouth daily. You may finish the supply of 40 mg tablets by taking two (2) tablets by mouth once daily.  *If you need a refill on your cardiac medications before your next appointment, please call your pharmacy*  Lab Work: BMET, CBC If you have labs (blood work) drawn today and your tests are completely normal, you will receive your results only by: MyChart Message (if you have MyChart) OR A paper copy in the mail If you have any lab test that is abnormal or we need to change your treatment, we will call you to review the results.  Testing/Procedures: Left and Right Heart Catheterization  Follow-Up: At [facility], you and your health needs are our priority.  As part of our continuing mission to provide you with exceptional heart care, our providers are all part of one team.  This team includes your primary Cardiologist (physician) and Advanced Practice Providers or APPs (Physician Assistants and Nurse Practitioners) who all work together to provide you with the care you need, when you need it.  Your next appointment:   2 week(s)  Provider:   Jon Hails, PA-C          We recommend signing up for the patient portal called MyChart.  Sign up information is provided on this After Visit Summary.  MyChart is used to connect with patients for Virtual Visits (Telemedicine).  Patients are able to view lab/test results, encounter notes, upcoming appointments, etc.  Non-urgent messages can be sent to your provider as well.   To learn more about what you can do with MyChart, go to [email or web address].   Other Instructions  We are referring you Cardio-Thoracic Surgery  [facility] A DEPT OF [place] HEARTCARE AT [address] A DEPT OF THE [place] [address] [place] [address] Dept: [phone] Loc: [phone]  REDELL NAZIR  01/18/2024  You are scheduled for a Cardiac Catheterization on Thursday, November 6 with Dr. Peter Jordan.  1. Please arrive at the [facility] (Main Entrance A) at [facility]: [address] [place], [place] [address] at 7:00 AM (This time is 2 hour(s) before your procedure to ensure your preparation).   Free valet parking service is available. You will check in at ADMITTING. The support person will be asked to wait in the waiting room.  It is OK to have someone drop you off and come back when you are ready to be discharged.    Special note: Every effort is made to have your procedure done on time. Please understand that emergencies sometimes delay scheduled procedures.  2. Diet: Nothing to eat after midnight.   3. Hydration: You need to be well hydrated before your procedure. On November 6, you may drink approved liquids (see below) until 2 hours before the procedure, with 16 oz of water as your last intake.   List of approved liquids water, clear juice, clear tea, black coffee, fruit juices, non-citric and without pulp, carbonated beverages, Gatorade, Kool -Aid, plain Jello-O and plain ice popsicles.  4. Labs: You will need to have blood drawn on Thursday, October 10 at [facility] (1st Floor), [address], [place], [place] [address]. You do not need to be fasting.  5. Medication instructions in preparation for your procedure:   Contrast Allergy: No  Current Outpatient Medications (Cardiovascular):    amLODipine  (NORVASC ) 5 MG tablet, Take 1 tablet (5 mg total) by mouth daily.   atorvastatin  (LIPITOR) 40 MG tablet, Take 1 tablet (40 mg total) by mouth daily.     Current Outpatient Medications (Other):    Cholecalciferol (VITAMIN D3) 125 MCG (5000 UT) CAPS, Take 5,000 Units by mouth daily in the afternoon.   lithium  carbonate 300 MG capsule, Take 3 capsules (900 mg total) by mouth at bedtime.   PARoxetine   (PAXIL ) 30 MG tablet, Take 1 tablet (30 mg total) by mouth daily.   QUEtiapine  (SEROQUEL  XR) 400 MG 24 hr tablet, Take 1 tablet (400 mg total) by mouth at bedtime.   cariprazine  (VRAYLAR ) 1.5 MG capsule, Take 1.5 mg by mouth daily. (Patient not taking: Reported on 01/18/2024)   cariprazine  (VRAYLAR ) 3 MG capsule, Take 1 capsule (3 mg total) by mouth daily. (Patient not taking: Reported on 01/18/2024)   Cariprazine  HCl (VRAYLAR ) 4.5 MG CAPS, Take 1 capsule (4.5 mg total) by mouth daily. (Patient not taking: Reported on 01/18/2024)   NON FORMULARY, Pt uses a c-pap nightly *For reference purposes while preparing patient instructions.   Delete this med list prior to printing instructions for patient.*   On the morning of your procedure, take your morning medicines.  You may use sips of water.  6. Plan to go home the same day, you will only stay overnight if medically necessary. 7. Bring a current list of your medications and current insurance cards. 8. You MUST have a responsible person to drive you home. 9. Someone MUST be with you the first 24 hours after you arrive home or your discharge will be delayed. 10. Please wear clothes that are easy to get on and off and wear slip-on shoes.  Thank you for allowing us  to care for you!   -- [facility]

## 2024-01-19 LAB — BASIC METABOLIC PANEL WITH GFR
BUN/Creatinine Ratio: 16 (ref 9–20)
BUN: 18 mg/dL (ref 6–24)
CO2: 21 mmol/L (ref 20–29)
Calcium: 9.4 mg/dL (ref 8.7–10.2)
Chloride: 104 mmol/L (ref 96–106)
Creatinine, Ser: 1.12 mg/dL (ref 0.76–1.27)
Glucose: 88 mg/dL (ref 70–99)
Potassium: 4.3 mmol/L (ref 3.5–5.2)
Sodium: 139 mmol/L (ref 134–144)
eGFR: 76 mL/min/1.73 (ref >59)

## 2024-01-19 LAB — CBC
Hematocrit: 46.2 % (ref 37.5–51.0)
Hemoglobin: 15.9 g/dL (ref 13.0–17.7)
MCH: 31.2 pg (ref 26.6–33.0)
MCHC: 34.4 g/dL (ref 31.5–35.7)
MCV: 91 fL (ref 79–97)
Platelets: 228 x10E3/uL (ref 150–450)
RBC: 5.1 x10E6/uL (ref 4.14–5.80)
RDW: 11.9 % (ref 11.6–15.4)
WBC: 9.2 x10E3/uL (ref 3.4–10.8)

## 2024-01-22 ENCOUNTER — Telehealth: Admitting: Emergency Medicine

## 2024-01-22 NOTE — Progress Notes (Signed)
 Rescheduled for a day when provider trained in smoking cessation is available.

## 2024-01-23 ENCOUNTER — Telehealth

## 2024-01-23 ENCOUNTER — Encounter: Payer: Self-pay | Admitting: Physician Assistant

## 2024-01-23 ENCOUNTER — Telehealth: Payer: Self-pay | Admitting: *Deleted

## 2024-01-23 DIAGNOSIS — F17210 Nicotine dependence, cigarettes, uncomplicated: Secondary | ICD-10-CM

## 2024-01-23 MED ORDER — NICOTINE POLACRILEX 4 MG MT GUM: CHEWING_GUM | OROMUCOSAL | 0 refills | Status: DC

## 2024-01-23 NOTE — Telephone Encounter (Signed)
 Cardiac Catheterization scheduled at [facility] for: Thursday January 25, 2024 9 AM Arrival time [facility] Main Entrance A at: 7 AM  Diet: -Nothing to eat after midnight.  Hydration: -May drink clear liquids until 2 hours before the procedure.  Approved liquids: Water, clear tea, black coffee, fruit juices-non-citric and without pulp,Gatorade, plain Jello/popsicles.   -Please drink 16 oz of water 2 hours before procedure.  Medication instructions: -Usual morning medications can be taken including aspirin  81 mg.  Plan to go home the same day, you will only stay overnight if medically necessary.  You must have responsible adult to drive you home.  Someone must be with you the first 24 hours after you arrive home.  Reviewed procedure instructions with patient.

## 2024-01-23 NOTE — Patient Instructions (Signed)
 Jonathon Walker, thank you for joining Elsie [name], PA-C for today's virtual visit.  While this provider is not your primary care provider (PCP), if your PCP is located in our provider database this encounter information will be shared with them immediately following your visit.  Consent: (Patient) Jonathon Walker provided verbal consent for this virtual visit at the beginning of the encounter.  Current Medications:  Current Outpatient Medications:  .  nicotine  polacrilex (NICORETTE) 4 MG gum, Weeks 1 to 6: Chew 1 piece of gum every 1 to 2 hours (maximum: 24 pieces/day); to increase chances of quitting, chew at least 9 pieces/day during the first 6 weeks., Disp: 100 each, Rfl: 0 .  amLODipine  (NORVASC ) 5 MG tablet, Take 1 tablet (5 mg total) by mouth daily., Disp: 90 tablet, Rfl: 3 .  atorvastatin  (LIPITOR) 80 MG tablet, Take 1 tablet (80 mg total) by mouth daily., Disp: 90 tablet, Rfl: 3 .  Cholecalciferol (VITAMIN D3) 125 MCG (5000 UT) CAPS, Take 5,000 Units by mouth daily in the afternoon., Disp: , Rfl:  .  lithium  carbonate 300 MG capsule, Take 3 capsules (900 mg total) by mouth at bedtime., Disp: 270 capsule, Rfl: 0 .  NON FORMULARY, Pt uses a c-pap nightly, Disp: , Rfl:  .  PARoxetine  (PAXIL ) 30 MG tablet, Take 1 tablet (30 mg total) by mouth daily., Disp: 90 tablet, Rfl: 0 .  QUEtiapine  (SEROQUEL  XR) 400 MG 24 hr tablet, Take 1 tablet (400 mg total) by mouth at bedtime., Disp: 90 tablet, Rfl: 0   When you are getting low on your smoking cessation medications, schedule your next video appointment as previously discussed. This way we can follow-up with you, make any necessary adjustments/changes, and get next fill of medication sent in to your pharmacy.   Follow-Up: Call back or seek an in-person evaluation if the symptoms worsen or if the condition fails to improve as anticipated.  Other Instructions  Congratulations for your interest in quitting smoking!  Quitting smoking is  one of the most important things you can do to protect your health.  We are here to help you! Did you know that if you quit smoking 1 pack per day you could save up to $2550.00 per year?  Medications are not appropriate for everyone depending upon your situation and any health issues you may have. If we do prescribe medication, it will be for 1 month at time and you will be required to have a follow up Video visit at 1 month to assess how you are doing and if there are any side effects.  This could be for up to a total of 3 months.    Support from your friends, family and work colleagues is very important. Please let them know that you are trying to stop smoking so that they understand your need for support in this goal!  - Remove tobacco products from your environment - Set a quit date ideally within 2 weeks. - You should totally abstain from smoking after your quit date. A single puff could hurt your progress or cause you to relapse - If there are others in your household that smoke, ask them to try to quit or abstain from smoking in your presence  You may notice nicotine  withdrawal symptoms such as increased appetite and weight gain, changes in mood, insomnia, irritability and/or anxiety. These symptoms peak in the first three days after smoking cessation and subside over the next 3-4 weeks.   I have prescribed  Nicotine  Gum with the following directions: I have sent in a 4 mg dose for you to take as follows:. Weeks 1 to 6: Chew 1 piece of gum every 1 to 2 hours (maximum: 24 pieces/day); to increase chances of quitting, chew at least 9 pieces/day during the first 6 weeks.  Weeks 7 to 9: Chew 1 piece of gum every 2 to 4 hours (maximum: 24 pieces/day). Weeks 10 to 12: Chew 1 piece of gum every 4 to 8 hours (maximum: 24 pieces/day).   Chew the gum slowly - using the chew and park method. Chew the gum until the nicotine  taste appears, then park the gum until the taste disappears, then chew again to  release more nicotine . Chew the gum for 30 minutes.  A combination of behavioral and medication can improve the success of you quitting smoking. You may want to use the 1-800-QUIT-NOW free support line.  Also, the Department of Health and Human Services provides Smoke free apps for smartphones: sharedcustomer.fi  If you happen to break your plan and have a cigarette, keep taking your medications and continue to try to abstain.  Do not give up!  MAKE SURE YOU  Take any prescribed medications only as instructed.  If you miss a dose of medication, take the next dose when it is due and get back on schedule. DO NOT double up on medications.  Mark your calendar to do your Follow Up Smoking Cessation Visit in one month   If you have been instructed to have an in-person evaluation today at a local Urgent Care facility, please use the link below. It will take you to a list of all of our available [facility], including address, phone number and hours of operation. Please do not delay care.  [facility]  If you or a family member do not have a primary care provider, use the link below to schedule a visit and establish care. When you choose a [facility] primary care physician or advanced practice provider, you gain a long-term partner in health. Find a Primary Care Provider  Learn more about [facility]'s in-office and virtual care options:  - Get Care Now

## 2024-01-23 NOTE — Progress Notes (Addendum)
 Virtual Visit Consent  Jonathon Walker, you are scheduled for a virtual visit with a [facility] provider today. Just as with appointments in the office, your consent must be obtained to participate. Your consent will be active for this visit and any virtual visit you may have with one of our providers in the next 365 days. If you have a MyChart account, a copy of this consent can be sent to you electronically.  As this is a virtual visit, video technology does not allow for your provider to perform a traditional examination. This may limit your provider's ability to fully assess your condition. If your provider identifies any concerns that need to be evaluated in person or the need to arrange testing (such as labs, EKG, etc.), we will make arrangements to do so. Although advances in technology are sophisticated, we cannot ensure that it will always work on either your end or our end. If the connection with a video visit is poor, the visit may have to be switched to a telephone visit. With either a video or telephone visit, we are not always able to ensure that we have a secure connection.  By engaging in this virtual visit, you consent to the provision of healthcare and authorize for your insurance to be billed (if applicable) for the services provided during this visit. Depending on your insurance coverage, you may receive a charge related to this service.  I need to obtain your verbal consent now. Are you willing to proceed with your visit today? Jonathon Walker has provided verbal consent on 01/23/2024 for a virtual visit (video or telephone). Elsie [name], [place]  Date: 01/23/2024 10:15 AM  Virtual Visit via Video Note  I, Elsie [name], connected with  Jonathon Walker  ([id number], Dec 06, 1965) on 01/23/24 at 10:15 AM EST by a video-enabled telemedicine application and verified that I am speaking with the correct person using two identifiers.  Location: Patient: Virtual Visit Location  Patient: Home Provider: Virtual Visit Location Provider: Home Office   I discussed the limitations of evaluation and management by telemedicine and the availability of in person appointments. The patient expressed understanding and agreed to proceed.    History of Present Illness: Jonathon Walker is wanting to discuss their tobacco use and is seeking assistance with cessation. Patient has been smoking/using around 1.5 packs cigarettes per day for the past 17 years.  Prior to that he also smoked some from [phone]. There has been prior attempts to quit with success. Notes back in 1994 when he quit, he did by going cold turkey. Notes he restarted back in 2008 due to mood and anxiety (GAD, MDD -- uncontrolled at that time, unemployed. Drinking and started smoking back again). Notes he feels ready to stop and confident in his ability. Recently having to see Cardiology for an assessment of his heart and that has him more proactive about his risk factors. Denies any chest pain or SOB. History of OSA, on CPAP at night.  BP Readings from Last 3 Encounters:  01/18/24 120/68  12/13/23 (!) 145/81  12/08/23 128/76   Has bough some of the Nicorette gum but has not started using it yet.   Triggers: The following trigger(s) for use has/have been identified: psychological: drinking alcohol, caffeine, car, stressors  Withdrawal Symptoms: Identified withdrawal symptoms: mild anxiety.  State of Readiness: Patient feels overall Ready to quit. Concerns/Barriers to Cessation - None  Observations/Objective: Patient is well-developed, well-nourished in no acute distress.  Resting comfortably at home.  Head is normocephalic, atraumatic.  No labored breathing. Speech is clear and coherent with logical content.  Patient is alert and oriented at baseline.   Assessment and Plan: 1. Nicotine  dependence, cigarettes, uncomplicated (Primary) - nicotine  polacrilex (NICORETTE) 4 MG gum; Weeks 1 to 6: Chew 1 piece of gum  every 1 to 2 hours (maximum: 24 pieces/day); to increase chances of quitting, chew at least 9 pieces/day during the first 6 weeks.  Dispense: 100 each; Refill: 0   - Patient is currently at the following State of Change: Preparation -- actively planning an attempt to quit - Comorbid conditions identified: OSA (on CPAP), CVD (Cardiology work up in process), Obesity. History of MDD and GAD, followed by Psychiatry and under good control.  - Reviewed impacts of smoking on patient's health. - Quit date set for today - He would really like to quit cold turkey. Discussed risks and statistics with this and encouraged use of the nicorette gum as directed, when needed - I have prescribed Nicotine  Gum with the following directions: I have sent in a 4 mg dose for you to take as follows:. Weeks 1 to 6: Chew 1 piece of gum every 1 to 2 hours (maximum: 24 pieces/day); to increase chances of quitting, chew at least 9 pieces/day during the first 6 weeks.  Weeks 7 to 9: Chew 1 piece of gum every 2 to 4 hours (maximum: 24 pieces/day). Weeks 10 to 12: Chew 1 piece of gum every 4 to 8 hours (maximum: 24 pieces/day).   Chew the gum slowly - using the chew and park method. Chew the gum until the nicotine  taste appears, then park the gum until the taste disappears, then chew again to release more nicotine . Chew the gum for 30 minutes. - Other resources including the [employer] and Department of Health and [employer] -- have been included in the patient's written instructions and sent to their MyChart. - Plan for follow-up in 2 weeks.  Follow Up Instructions: I discussed the assessment and treatment plan with the patient. The patient was provided an opportunity to ask questions and all were answered. The patient agreed with the plan and demonstrated an understanding of the instructions.  A copy of instructions were sent to the patient via MyChart unless otherwise noted below.   Time:  I have spent 25  minutes with the patient via telehealth technology in tobacco cessation counseling.    Elsie [name], PA-C

## 2024-01-25 ENCOUNTER — Other Ambulatory Visit: Payer: Self-pay

## 2024-01-25 ENCOUNTER — Ambulatory Visit (HOSPITAL_COMMUNITY)
Admission: RE | Admit: 2024-01-25 | Discharge: 2024-01-25 | Disposition: A | Discharge status: Home / Self Care | Attending: Cardiology | Admitting: Cardiology

## 2024-01-25 ENCOUNTER — Encounter (HOSPITAL_COMMUNITY)
Admission: RE | Disposition: A | Discharge status: Home / Self Care | Payer: Self-pay | Source: Home / Self Care | Attending: Cardiology

## 2024-01-25 DIAGNOSIS — G4733 Obstructive sleep apnea (adult) (pediatric): Secondary | ICD-10-CM | POA: Insufficient documentation

## 2024-01-25 DIAGNOSIS — E785 Hyperlipidemia, unspecified: Secondary | ICD-10-CM | POA: Diagnosis not present

## 2024-01-25 DIAGNOSIS — Z79899 Other long term (current) drug therapy: Secondary | ICD-10-CM | POA: Diagnosis not present

## 2024-01-25 DIAGNOSIS — F329 Major depressive disorder, single episode, unspecified: Secondary | ICD-10-CM | POA: Diagnosis not present

## 2024-01-25 DIAGNOSIS — J449 Chronic obstructive pulmonary disease, unspecified: Secondary | ICD-10-CM | POA: Insufficient documentation

## 2024-01-25 DIAGNOSIS — F17210 Nicotine dependence, cigarettes, uncomplicated: Secondary | ICD-10-CM | POA: Insufficient documentation

## 2024-01-25 DIAGNOSIS — I341 Nonrheumatic mitral (valve) prolapse: Secondary | ICD-10-CM | POA: Diagnosis not present

## 2024-01-25 DIAGNOSIS — I272 Pulmonary hypertension, unspecified: Secondary | ICD-10-CM | POA: Diagnosis not present

## 2024-01-25 DIAGNOSIS — I340 Nonrheumatic mitral (valve) insufficiency: Secondary | ICD-10-CM | POA: Diagnosis present

## 2024-01-25 DIAGNOSIS — I10 Essential (primary) hypertension: Secondary | ICD-10-CM | POA: Insufficient documentation

## 2024-01-25 HISTORY — PX: RIGHT/LEFT HEART CATH AND CORONARY ANGIOGRAPHY: CATH118266

## 2024-01-25 LAB — POCT I-STAT EG7
Acid-base deficit: 2 mmol/L (ref 0.0–2.0)
Acid-base deficit: 2 mmol/L (ref 0.0–2.0)
Bicarbonate: 24.2 mmol/L (ref 20.0–28.0)
Bicarbonate: 24 mmol/L (ref 20.0–28.0)
Calcium, Ion: 1.24 mmol/L (ref 1.15–1.40)
Calcium, Ion: 1.23 mmol/L (ref 1.15–1.40)
HCT: 38 % — ABNORMAL LOW (ref 39.0–52.0)
HCT: 38 % — ABNORMAL LOW (ref 39.0–52.0)
Hemoglobin: 12.9 g/dL — ABNORMAL LOW (ref 13.0–17.0)
Hemoglobin: 12.9 g/dL — ABNORMAL LOW (ref 13.0–17.0)
O2 Saturation: 71 %
O2 Saturation: 71 %
Potassium: 4.1 mmol/L (ref 3.5–5.1)
Potassium: 4 mmol/L (ref 3.5–5.1)
Sodium: 140 mmol/L (ref 135–145)
Sodium: 140 mmol/L (ref 135–145)
TCO2: 26 mmol/L (ref 22–32)
TCO2: 25 mmol/L (ref 22–32)
pCO2, Ven: 45.4 mmHg (ref 44–60)
pCO2, Ven: 45.7 mmHg (ref 44–60)
pH, Ven: 7.335 (ref 7.25–7.43)
pH, Ven: 7.327 (ref 7.25–7.43)
pO2, Ven: 40 mmHg (ref 32–45)
pO2, Ven: 40 mmHg (ref 32–45)

## 2024-01-25 LAB — POCT I-STAT 7, (LYTES, BLD GAS, ICA,H+H)
Acid-base deficit: 3 mmol/L — ABNORMAL HIGH (ref 0.0–2.0)
Bicarbonate: 22.3 mmol/L (ref 20.0–28.0)
Calcium, Ion: 1.23 mmol/L (ref 1.15–1.40)
HCT: 37 % — ABNORMAL LOW (ref 39.0–52.0)
Hemoglobin: 12.6 g/dL — ABNORMAL LOW (ref 13.0–17.0)
O2 Saturation: 94 %
Potassium: 4 mmol/L (ref 3.5–5.1)
Sodium: 142 mmol/L (ref 135–145)
TCO2: 24 mmol/L (ref 22–32)
pCO2 arterial: 39.6 mmHg (ref 32–48)
pH, Arterial: 7.359 (ref 7.35–7.45)
pO2, Arterial: 74 mmHg — ABNORMAL LOW (ref 83–108)

## 2024-01-25 SURGERY — RIGHT/LEFT HEART CATH AND CORONARY ANGIOGRAPHY
Anesthesia: LOCAL

## 2024-01-25 MED ORDER — SODIUM CHLORIDE 0.9% FLUSH: 3.0000 mL | INTRAVENOUS | Status: DC | PRN

## 2024-01-25 MED ORDER — ASPIRIN 81 MG PO CHEW
81.0000 mg | CHEWABLE_TABLET | ORAL | Status: AC
  Administered 2024-01-25: 81 mg via ORAL
  Filled 2024-01-25: qty 1

## 2024-01-25 MED ORDER — VERAPAMIL HCL 2.5 MG/ML IV SOLN
INTRAVENOUS | Status: AC
Start: 2024-01-25 — End: 2024-01-25
  Filled 2024-01-25: qty 2

## 2024-01-25 MED ORDER — HEPARIN SODIUM (PORCINE) 1000 UNIT/ML IJ SOLN
INTRAMUSCULAR | Status: AC
  Filled 2024-01-25: qty 10

## 2024-01-25 MED ORDER — SODIUM CHLORIDE 0.9% FLUSH: 3.0000 mL | Freq: Two times a day (BID) | INTRAVENOUS | Status: DC

## 2024-01-25 MED ORDER — VERAPAMIL HCL 2.5 MG/ML IV SOLN
INTRAVENOUS | Status: DC | PRN
  Administered 2024-01-25: 10 mL via INTRA_ARTERIAL

## 2024-01-25 MED ORDER — HEPARIN SODIUM (PORCINE) 1000 UNIT/ML IJ SOLN
INTRAMUSCULAR | Status: DC | PRN
Start: 2024-01-25 — End: 2024-01-25
  Administered 2024-01-25: 6000 [IU] via INTRAVENOUS

## 2024-01-25 MED ORDER — FENTANYL CITRATE (PF) 100 MCG/2ML IJ SOLN
INTRAMUSCULAR | Status: DC | PRN
  Administered 2024-01-25: 25 ug via INTRAVENOUS

## 2024-01-25 MED ORDER — MIDAZOLAM HCL 2 MG/2ML IJ SOLN
INTRAMUSCULAR | Status: AC
Start: 2024-01-25 — End: 2024-01-25
  Filled 2024-01-25: qty 2

## 2024-01-25 MED ORDER — IOHEXOL 350 MG/ML SOLN
INTRAVENOUS | Status: DC | PRN
  Administered 2024-01-25: 35 mL

## 2024-01-25 MED ORDER — FREE WATER: 500.0000 mL | Freq: Once | Status: DC

## 2024-01-25 MED ORDER — FENTANYL CITRATE (PF) 100 MCG/2ML IJ SOLN
INTRAMUSCULAR | Status: AC
  Filled 2024-01-25: qty 2

## 2024-01-25 MED ORDER — SODIUM CHLORIDE 0.9 % IV SOLN: 250.0000 mL | INTRAVENOUS | Status: DC | PRN

## 2024-01-25 MED ORDER — LIDOCAINE HCL (PF) 1 % IJ SOLN
INTRAMUSCULAR | Status: AC
  Filled 2024-01-25: qty 30

## 2024-01-25 MED ORDER — MIDAZOLAM HCL (PF) 2 MG/2ML IJ SOLN
INTRAMUSCULAR | Status: DC | PRN
  Administered 2024-01-25: 1 mg via INTRAVENOUS

## 2024-01-25 MED ORDER — HEPARIN (PORCINE) IN NACL 1000-0.9 UT/500ML-% IV SOLN
INTRAVENOUS | Status: DC | PRN
  Administered 2024-01-25: 1000 mL via SURGICAL_CAVITY

## 2024-01-25 MED ORDER — LIDOCAINE HCL (PF) 1 % IJ SOLN
INTRAMUSCULAR | Status: DC | PRN
  Administered 2024-01-25: 5 mL

## 2024-01-25 SURGICAL SUPPLY — 9 items
CATH 5FR JL3.5 JR4 ANG PIG MP (CATHETERS) IMPLANT
CATH BALLN WEDGE 5F 110CM (CATHETERS) IMPLANT
DEVICE RAD COMP TR BAND LRG (VASCULAR PRODUCTS) IMPLANT
GLIDESHEATH SLEND SS 6F .021 (SHEATH) IMPLANT
GUIDEWIRE INQWIRE 1.5J.035X260 (WIRE) IMPLANT
PACK CARDIAC CATHETERIZATION (CUSTOM PROCEDURE TRAY) ×1 IMPLANT
SET ATX-X65L (MISCELLANEOUS) IMPLANT
SHEATH GLIDE SLENDER 4/5FR (SHEATH) IMPLANT
SHEATH PROBE COVER 6X72 (BAG) IMPLANT

## 2024-01-25 NOTE — Progress Notes (Signed)
 Discharge instructions reviewed with patient and Mother Randall via Telephone. Questions and concerns addressed. TR Band removed. No s/s of complications at the incision site.  Brachial Site remained C/D/I during short stay visit. PT tolerated PO intake. Ambulated in the hallway was able to void in the bathroom, without difficulty.

## 2024-01-25 NOTE — Progress Notes (Signed)
 PT escorted from unit to personal vehicle via wheelchair.

## 2024-01-25 NOTE — Interval H&P Note (Signed)
 History and Physical Interval Note:  01/25/2024 7:24 AM  Jonathon Walker  has presented today for surgery, with the diagnosis of mr.  The various methods of treatment have been discussed with the patient and family. After consideration of risks, benefits and other options for treatment, the patient has consented to  Procedure(s): RIGHT/LEFT HEART CATH AND CORONARY ANGIOGRAPHY (N/A) as a surgical intervention.  The patient's history has been reviewed, patient examined, no change in status, stable for surgery.  I have reviewed the patient's chart and labs.  Questions were answered to the patient's satisfaction.     [facility] 01/25/2024 7:24 AM

## 2024-01-25 NOTE — Discharge Instructions (Signed)

## 2024-01-26 ENCOUNTER — Encounter (HOSPITAL_COMMUNITY): Payer: Self-pay | Admitting: Cardiology

## 2024-01-26 ENCOUNTER — Ambulatory Visit: Payer: Self-pay | Admitting: Physician Assistant

## 2024-01-31 ENCOUNTER — Ambulatory Visit: Admitting: Physician Assistant

## 2024-02-01 ENCOUNTER — Ambulatory Visit: Admitting: Physician Assistant

## 2024-02-01 ENCOUNTER — Ambulatory Visit

## 2024-02-01 VITALS — BP 132/75 | HR 80 | Resp 20 | Ht 75.0 in | Wt 305.0 lb

## 2024-02-01 DIAGNOSIS — I340 Nonrheumatic mitral (valve) insufficiency: Secondary | ICD-10-CM | POA: Diagnosis not present

## 2024-02-01 NOTE — Progress Notes (Signed)
 [address], [place] [address]             [phone]    DAREL [facility] Medical Record #[id number] Date of Birth: [birth year]  Referring: Madie Jon Garre, PA Primary Care: Merna Huxley, NP Primary Cardiologist:Traci Shlomo, MD  Chief Complaint:    Chief Complaint  Patient presents with   Mitral Regurgitation    Surgical consult/ Cardiac Cath 01/25/24/ TEE 12/13/23    History of Present Illness:     TEVON BERHANE is a 58 y.o. male who presents for surgical evaluation of severe mitral regurgitation.  He had a TTE in 2021 that demonstrated moderate MR, and it has now progressed to severe.  It is difficult to discern whether or not he has symptoms.  He has severe MDD and anxiety and has felt fatigue for many years.  He also does feel some shortness of breath with the fatigue but feels it is mostly related to his mood.  When his mood is down he feels heavy, fatigued and dyspneic.  When his mood is better, all of those symptoms resolve.  He recently helped a unload a large amount of mulch from a trailer when his mood was good, and he felt really good while doing it.  He is fairly sedentary but does mow grass sometimes and wants to get back to working.  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  He quit smoking a couple weeks ago, but prior to that was smoking 1.5 PPD x 5 years.  He has quit multiple times.  He also used to be an alcoholic but has been sober for 5 years.  He otherwise lives independently and is functional in all his ADLs   LHC (01/25/24): Normal coronaries RHC (01/25/24): Elevated filling pressures: PAP 37/18, PCW 12; preserved index 2.7 TEE (12/13/23): Normal LV function, normal RV function.  Myxomatous posterior leaflet of mitral valve with P3 scallop flail and prolapse.  Severe MR. TTE (11/29/23): EF 60-65%, LVESD 3.23 cm Chest CT (08/28/23): No significant aortic calcification, centrilobular  and paraseptal emphysema  Past Medical and Surgical History: Previous Chest Surgery: No Previous Chest Radiation: No Diabetes Mellitus: No.  HbA1C N/A Anticoagulation: No, Last dose N/A  Creatinine:  Lab Results  Component Value Date   CREATININE 1.12 01/18/2024   CREATININE 1.15 12/08/2023   CREATININE 1.22 05/26/2023     Past Medical History:  Diagnosis Date   Alcohol abuse    Anxiety    Bimalleolar fracture of left ankle    Depression    Heart murmur    HLD (hyperlipidemia)    HTN (hypertension)    MVP (mitral valve prolapse)    moderate MVP of the middle scallop of the posterior MVL with moderate to severe MR echo 11/2023   Sleep apnea    uses CPAP nightly   Tobacco use    Vitamin D  deficiency     Past Surgical History:  Procedure Laterality Date   ORIF ANKLE FRACTURE Left 09/19/2017   Procedure: OPEN REDUCTION INTERNAL FIXATION (ORIF) ANKLE FRACTURE;  Surgeon: Beverley Evalene JONETTA, MD;  Location: [facility];  Service: Orthopedics;  Laterality: Left;   RIGHT/LEFT HEART CATH AND CORONARY ANGIOGRAPHY N/A 01/25/2024   Procedure: RIGHT/LEFT HEART CATH AND CORONARY ANGIOGRAPHY;  Surgeon: Jordan, Peter M, MD;  Location: [facility] INVASIVE CV LAB;  Service: Cardiovascular;  Laterality: N/A;   TOOTH EXTRACTION  TRANSESOPHAGEAL ECHOCARDIOGRAM (CATH LAB) N/A 12/13/2023   Procedure: TRANSESOPHAGEAL ECHOCARDIOGRAM;  Surgeon: Loni Soyla LABOR, MD;  Location: [facility] INVASIVE CV LAB;  Service: Cardiovascular;  Laterality: N/A;    Social History:  Social History   Tobacco Use  Smoking Status Every Day   Current packs/day: 1.50   Average packs/day: 1.5 packs/day for 48.9 years (73.3 ttl pk-yrs)   Types: Cigarettes   Start date: 03/22/1975  Smokeless Tobacco Never  Tobacco Comments   He reports he has quit 3 times and recently started back in 2012. Hsm    1/2 PPD    Social History   Substance and Sexual Activity  Alcohol Use Not Currently     No Known  Allergies   Current Outpatient Medications  Medication Sig Dispense Refill   amLODipine  (NORVASC ) 5 MG tablet Take 1 tablet (5 mg total) by mouth daily. 90 tablet 3   atorvastatin  (LIPITOR) 80 MG tablet Take 1 tablet (80 mg total) by mouth daily. (Patient taking differently: Take 40 mg by mouth daily.) 90 tablet 3   Cholecalciferol (VITAMIN D3) 125 MCG (5000 UT) CAPS Take 5,000 Units by mouth daily in the afternoon.     lithium  carbonate 300 MG capsule Take 3 capsules (900 mg total) by mouth at bedtime. 270 capsule 0   NON FORMULARY Pt uses a c-pap nightly     PARoxetine  (PAXIL ) 30 MG tablet Take 1 tablet (30 mg total) by mouth daily. 90 tablet 0   QUEtiapine  (SEROQUEL  XR) 400 MG 24 hr tablet Take 1 tablet (400 mg total) by mouth at bedtime. 90 tablet 0   No current facility-administered medications for this visit.    (Not in a hospital admission)   Family History  Problem Relation Age of Onset   Cancer Maternal Grandfather    Colon cancer Neg Hx    Colon polyps Neg Hx    Esophageal cancer Neg Hx    Rectal cancer Neg Hx    Stomach cancer Neg Hx      Review of Systems:   Review of Systems  Constitutional:  Positive for malaise/fatigue. Negative for weight loss.  Respiratory:  Positive for shortness of breath.   Cardiovascular:  Negative for chest pain, palpitations, orthopnea and leg swelling.  Gastrointestinal:  Negative for nausea and vomiting.  Neurological:  Negative for dizziness and headaches.  Psychiatric/Behavioral:  Positive for depression.       Physical Exam: BP 132/75   Pulse 80   Resp 20   Ht 6' 3 (1.905 m)   Wt (!) 305 lb (138.3 kg)   SpO2 95% Comment: RA  BMI 38.12 kg/m  Physical Exam Constitutional:      Appearance: Normal appearance.  HENT:     Head: Normocephalic and atraumatic.  Cardiovascular:     Rate and Rhythm: Normal rate and regular rhythm.     Heart sounds: Murmur heard.  Pulmonary:     Effort: Pulmonary effort is normal.      Breath sounds: Normal breath sounds.  Abdominal:     General: There is no distension.     Palpations: Abdomen is soft.     Tenderness: There is no abdominal tenderness.  Musculoskeletal:        General: No swelling.  Skin:    General: Skin is warm and dry.  Neurological:     General: No focal deficit present.     Mental Status: He is alert and oriented to person, place, and time.  Diagnostic Studies & Laboratory data: Cardiac Studies & Procedures   ______________________________________________________________________________________________ CARDIAC CATHETERIZATION  CARDIAC CATHETERIZATION 01/25/2024  Conclusion   LV end diastolic pressure is mildly elevated.   Hemodynamic findings consistent with mild pulmonary hypertension.  Normal coronary anatomy Mildly elevated LV filling pressures. PCWP 20/19, mean 12 mm Hg Mildly elevated right heart pressures. PAP 37/18, mean 29 mm Hg Normal cardiac output. 6.93 L/min, index 2.67  Plan: CT surgery consultation for MV repair.  Findings Coronary Findings Diagnostic  Dominance: Right  Left Main Vessel was injected. Vessel is normal in caliber. Vessel is angiographically normal.  Left Anterior Descending Vessel was injected. Vessel is normal in caliber. Vessel is angiographically normal.  Left Circumflex Vessel was injected. Vessel is normal in caliber. Vessel is angiographically normal.  Right Coronary Artery Vessel was injected. Vessel is normal in caliber. Vessel is angiographically normal.  Intervention  No interventions have been documented.     ECHOCARDIOGRAM  ECHOCARDIOGRAM COMPLETE 11/29/2023  Narrative ECHOCARDIOGRAM REPORT    Patient Name:   ZACHARIE PORTNER Date of Exam: 11/29/2023 Medical Rec #:  [id number]       Height:       75.0 in Accession #:    [id number]      Weight:       284.4 lb Date of Birth:  04-04-1965      BSA:          2.549 m Patient Age:    57 years        BP:           122/73  mmHg Patient Gender: M               HR:           66 bpm. Exam Location:  [address]  Procedure: 2D Echo, 3D Echo and Strain Analysis (Both Spectral and Color Flow Doppler were utilized during procedure).  Indications:    I05.9 Mitral valve disorder; I34.0 Nonrheumatic mitral (valve) insufficiency  History:        Patient has prior history of Echocardiogram examinations, most recent 07/25/2019. Signs/Symptoms:Murmur; Risk Factors:Hypertension, Dyslipidemia, Sleep Apnea and Current Smoker. Asymmetric septal hypertrophy. Pulmonary hypertension.  Sonographer:    Jon Hacker RCS Referring Phys: [phone] TRACI R TURNER  IMPRESSIONS   1. Left ventricular ejection fraction, by estimation, is 60 to 65%. Left ventricular ejection fraction by 3D volume is 61 %. The left ventricle has normal function. The left ventricle has no regional wall motion abnormalities. Left ventricular diastolic parameters were normal. The average left ventricular global longitudinal strain is -20.2 %. The global longitudinal strain is normal. 2. Right ventricular systolic function is normal. The right ventricular size is normal. There is normal pulmonary artery systolic pressure. The estimated right ventricular systolic pressure is 27.6 mmHg. 3. The mitral valve is myxomatous. Moderate to severe mitral valve regurgitation. No evidence of mitral stenosis. There is moderate holosystolic prolapse of the middle scallop of the posterior leaflet of the mitral valve. 4. The aortic valve is tricuspid. Aortic valve regurgitation is not visualized. No aortic stenosis is present. 5. The inferior vena cava is normal in size with greater than 50% respiratory variability, suggesting right atrial pressure of 3 mmHg.  Conclusion(s)/Recommendation(s): Consider TEE for further clarification of mitral regurgitation.  FINDINGS Left Ventricle: Left ventricular ejection fraction, by estimation, is 60 to 65%. Left ventricular ejection  fraction by 3D volume is 61 %. The left ventricle has normal function. The left ventricle has no regional  wall motion abnormalities. The average left ventricular global longitudinal strain is -20.2 %. Strain was performed and the global longitudinal strain is normal. The left ventricular internal cavity size was normal in size. There is no left ventricular hypertrophy. Left ventricular diastolic parameters were normal.  Right Ventricle: The right ventricular size is normal. No increase in right ventricular wall thickness. Right ventricular systolic function is normal. There is normal pulmonary artery systolic pressure. The tricuspid regurgitant velocity is 2.48 m/s, and with an assumed right atrial pressure of 3 mmHg, the estimated right ventricular systolic pressure is 27.6 mmHg.  Left Atrium: Left atrial size was normal in size.  Right Atrium: Right atrial size was normal in size.  Pericardium: There is no evidence of pericardial effusion.  Mitral Valve: The mitral valve is myxomatous. There is moderate holosystolic prolapse of the middle scallop of the posterior leaflet of the mitral valve. Moderate to severe mitral valve regurgitation, with anteriorly-directed jet. No evidence of mitral valve stenosis.  Tricuspid Valve: The tricuspid valve is normal in structure. Tricuspid valve regurgitation is not demonstrated. No evidence of tricuspid stenosis.  Aortic Valve: The aortic valve is tricuspid. Aortic valve regurgitation is not visualized. No aortic stenosis is present.  Pulmonic Valve: The pulmonic valve was normal in structure. Pulmonic valve regurgitation is not visualized. No evidence of pulmonic stenosis.  Aorta: The aortic root is normal in size and structure.  Venous: The inferior vena cava is normal in size with greater than 50% respiratory variability, suggesting right atrial pressure of 3 mmHg.  IAS/Shunts: No atrial level shunt detected by color flow Doppler.  Additional  Comments: 3D was performed not requiring image post processing on an independent workstation and was normal.   LEFT VENTRICLE PLAX 2D LVIDd:         5.84 cm         Diastology LVIDs:         3.23 cm         LV e' medial:    9.57 cm/s LV PW:         0.98 cm         LV E/e' medial:  13.0 LV IVS:        1.22 cm         LV e' lateral:   13.70 cm/s LVOT diam:     2.10 cm         LV E/e' lateral: 9.1 LV SV:         95 LV SV Index:   37              2D Longitudinal LVOT Area:     3.46 cm        Strain 2D Strain GLS   -20.8 % (A4C): 2D Strain GLS   -18.8 % (A3C): 2D Strain GLS   -21.0 % (A2C): 2D Strain GLS   -20.2 % Avg:  3D Volume EF LV 3D EF:    Left ventricul ar ejection fraction by 3D volume is 61 %.  3D Volume EF: 3D EF:        61 % LV EDV:       234 ml LV ESV:       92 ml LV SV:        142 ml  RIGHT VENTRICLE RV Basal diam:  2.86 cm RV S prime:     12.90 cm/s TAPSE (M-mode): 2.3 cm RVSP:           27.6 mmHg  LEFT ATRIUM             Index        RIGHT ATRIUM           Index LA diam:        4.60 cm 1.80 cm/m   RA Pressure: 3.00 mmHg LA Vol (A2C):   91.4 ml 35.86 ml/m  RA Area:     12.90 cm LA Vol (A4C):   83.6 ml 32.80 ml/m  RA Volume:   26.60 ml  10.44 ml/m LA Biplane Vol: 89.0 ml 34.92 ml/m AORTIC VALVE LVOT Vmax:   126.00 cm/s LVOT Vmean:  83.600 cm/s LVOT VTI:    0.273 m  AORTA Ao Root diam: 3.60 cm Ao Asc diam:  3.30 cm  MV E velocity: 124.00 cm/s  TRICUSPID VALVE MV A velocity: 89.40 cm/s   TR Peak grad:   24.6 mmHg MV E/A ratio:  1.39         TR Vmax:        248.00 cm/s Estimated RAP:  3.00 mmHg RVSP:           27.6 mmHg  SHUNTS Systemic VTI:  0.27 m Systemic Diam: 2.10 cm  Oneil Parchment MD Electronically signed by Oneil Parchment MD Signature Date/Time: 11/29/2023/2:33:11 PM    Final   TEE  ECHO TEE 12/13/2023  Narrative TRANSESOPHOGEAL ECHO REPORT    Patient Name:   YAASIR MENKEN Date of Exam: 12/13/2023 Medical Rec #:   [id number]       Height:       75.0 in Accession #:    [id number]      Weight:       294.0 lb Date of Birth:  06-24-1965      BSA:          2.585 m Patient Age:    57 years        BP:           139/64 mmHg Patient Gender: M               HR:           65 bpm. Exam Location:  Outpatient  Procedure: Transesophageal Echo, 3D Echo, Cardiac Doppler and Color Doppler (Both Spectral and Color Flow Doppler were utilized during procedure).  Indications:     MVP  History:         Patient has prior history of Echocardiogram examinations, most recent 11/29/2023. Mitral Valve Prolapse; Risk Factors:Sleep Apnea, Hypertension and Dyslipidemia.  Sonographer:     Philomena Daring Referring Phys:  [id number] THOM CROME HALEY Diagnosing Phys: Soyla Merck MD  PROCEDURE: After discussion of the risks and benefits of a TEE, an informed consent was obtained from the patient. The transesophogeal probe was passed without difficulty through the esophogus of the patient. Imaged were obtained with the patient in a left lateral decubitus position. Sedation performed by different physician. The patient was monitored while under deep sedation. Anesthestetic sedation was provided intravenously by Anesthesiology: 561mg  of Propofol , 100mg  of Lidocaine . Image quality was excellent. The patient's vital signs; including heart rate, blood pressure, and oxygen saturation; remained stable throughout the procedure. The patient developed no complications during the procedure.  IMPRESSIONS   1. Left ventricular ejection fraction, by estimation, is 60 to 65%. The left ventricle has normal function. 2. Right ventricular systolic function is normal. The right ventricular size is normal. 3. Left atrial size was mildly dilated. No left atrial/left atrial appendage thrombus was  detected. The LAA emptying velocity was 49 cm/s. 4. Myxomatous posterior leaflet of the mitral valve with P3 scallop flail and prolapse. Severe mitral valve  regurgitation. Blunting of systolic pulmonary vein flow, with intermittently seen systolic reversals.. The mitral valve is myxomatous. Severe mitral valve regurgitation. No evidence of mitral stenosis. 5. The aortic valve is tricuspid. Aortic valve regurgitation is not visualized. No aortic stenosis is present. 6. Agitated saline contrast bubble study was negative, with no evidence of any interatrial shunt. 7. 3D performed of the mitral valve and demonstrates P3 scallop flail.  FINDINGS Left Ventricle: Left ventricular ejection fraction, by estimation, is 60 to 65%. The left ventricle has normal function. The left ventricular internal cavity size was normal in size.  Right Ventricle: The right ventricular size is normal. No increase in right ventricular wall thickness. Right ventricular systolic function is normal.  Left Atrium: Left atrial size was mildly dilated. No left atrial/left atrial appendage thrombus was detected. The LAA emptying velocity was 49 cm/s.  Right Atrium: Right atrial size was normal in size.  Pericardium: There is no evidence of pericardial effusion.  Mitral Valve: Myxomatous posterior leaflet of the mitral valve with P3 scallop flail and prolapse. Severe mitral valve regurgitation. Blunting of systolic pulmonary vein flow, with intermittently seen systolic reversals. The mitral valve is myxomatous. Severe mitral valve regurgitation. No evidence of mitral valve stenosis. The mean mitral valve gradient is 1.5 mmHg with average heart rate of 49 bpm.  Tricuspid Valve: The tricuspid valve is normal in structure. Tricuspid valve regurgitation is trivial.  Aortic Valve: The aortic valve is tricuspid. Aortic valve regurgitation is not visualized. No aortic stenosis is present.  Pulmonic Valve: The pulmonic valve was normal in structure. Pulmonic valve regurgitation is trivial.  Aorta: The aortic root and ascending aorta are structurally normal, with no evidence of  dilitation.  IAS/Shunts: No atrial level shunt detected by color flow Doppler. Agitated saline contrast was given intravenously to evaluate for intracardiac shunting. Agitated saline contrast bubble study was negative, with no evidence of any interatrial shunt.  Additional Comments: 3D was performed not requiring image post processing on an independent workstation and was abnormal. Spectral Doppler performed.  LEFT VENTRICLE PLAX 2D LVOT diam:     2.30 cm LV SV:         67 LV SV Index:   26 LVOT Area:     4.15 cm   AORTIC VALVE LVOT Vmax:   76.30 cm/s LVOT Vmean:  51.200 cm/s LVOT VTI:    0.161 m  AORTA Ao Root diam: 3.50 cm  MITRAL VALVE MV Mean grad: 1.5 mmHg SHUNTS MR PISA: 10.62 cm     Systemic VTI:  0.16 m Systemic Diam: 2.30 cm  Soyla Merck MD Electronically signed by Soyla Merck MD Signature Date/Time: 12/13/2023/7:18:55 PM    Final        ______________________________________________________________________________________________     EKG: NSR  I have independently reviewed the above radiologic studies and discussed with the patient   Recent Lab Findings: Lab Results  Component Value Date   WBC 9.2 01/18/2024   HGB 12.9 (L) 01/25/2024   HCT 38.0 (L) 01/25/2024   PLT 228 01/18/2024   GLUCOSE 88 01/18/2024   CHOL 137 05/26/2023   TRIG 116.0 05/26/2023   HDL 34.90 (L) 05/26/2023   LDLDIRECT 204.0 05/31/2018   LDLCALC 79 05/26/2023   ALT 39 05/26/2023   AST 18 05/26/2023   NA 140 01/25/2024   K 4.1 01/25/2024  CL 104 01/18/2024   CREATININE 1.12 01/18/2024   BUN 18 01/18/2024   CO2 21 01/18/2024   TSH 2.17 05/26/2023   HGBA1C 5.8 06/16/2020      Assessment / Plan:   Mr. Talarico is a very pleasant 58 year old man with history of tobacco and alcohol abuse, as well as MDD and anxiety.  He is working closely with a psychiatrist on controlling his MDD.  He recently had an ECHO performed which demonstrated severe MR due to P3  prolapse.  His LVEF is preserved (60%) and LVESD is 3.2 cm.  It is hard to know if he is symptomatic, but I do not get the sense that he is.  His fatigue and dyspnea resolve when his mood is good, which would not be the case if his symptoms were due to MR.  Given that he is asymptomatic with normal EF and LVESD, there is no absolute indication for surgery at this time.  That being said, he is low risk for surgery and has a high likelihood for repair, so this would qualify as a Class IIa indication for surgery.  After a long discussion with the patient and his mother, he strongly prefers to hold off on surgery at this time.  He would like to continue surveillance and will return to my office in 6 months with a new TTE.  I advised him that if he develops new, persistent symptoms between now and then, that he should be seen sooner. We also discussed the options of sternotomy vs thoracotomy approach for surgery, and he does initially prefer thoracotomy approach for now but would still like to see me for follow-up.  Will have him come back in 6 months with a repeat TTE and re-evaluate need for intervention at that time.  I  spent 45 minutes counseling the patient face to face.   Con RAMAN Shaniyah Wix 02/01/2024 1:09 PM

## 2024-02-02 ENCOUNTER — Other Ambulatory Visit: Payer: Self-pay | Admitting: Psychiatry

## 2024-02-02 DIAGNOSIS — F411 Generalized anxiety disorder: Secondary | ICD-10-CM

## 2024-02-02 DIAGNOSIS — F422 Mixed obsessional thoughts and acts: Secondary | ICD-10-CM

## 2024-02-06 ENCOUNTER — Telehealth: Admitting: Physician Assistant

## 2024-02-06 ENCOUNTER — Ambulatory Visit

## 2024-02-06 DIAGNOSIS — F17211 Nicotine dependence, cigarettes, in remission: Secondary | ICD-10-CM

## 2024-02-06 NOTE — Progress Notes (Signed)
 Virtual Visit Consent  Jonathon Walker, you are scheduled for a virtual visit with a [facility] provider today. Just as with appointments in the office, your consent must be obtained to participate. Your consent will be active for this visit and any virtual visit you may have with one of our providers in the next 365 days. If you have a MyChart account, a copy of this consent can be sent to you electronically.  As this is a virtual visit, video technology does not allow for your provider to perform a traditional examination. This may limit your provider's ability to fully assess your condition. If your provider identifies any concerns that need to be evaluated in person or the need to arrange testing (such as labs, EKG, etc.), we will make arrangements to do so. Although advances in technology are sophisticated, we cannot ensure that it will always work on either your end or our end. If the connection with a video visit is poor, the visit may have to be switched to a telephone visit. With either a video or telephone visit, we are not always able to ensure that we have a secure connection.  By engaging in this virtual visit, you consent to the provision of healthcare and authorize for your insurance to be billed (if applicable) for the services provided during this visit. Depending on your insurance coverage, you may receive a charge related to this service.  I need to obtain your verbal consent now. Are you willing to proceed with your visit today? REG BIRCHER has provided verbal consent on 02/06/2024 for a virtual visit (video or telephone). Jonathon Walker, [place]  Date: 02/06/2024 2:00 PM  Virtual Visit via Video Note  I, Jonathon Walker, connected with  Jonathon Walker  ([id number], 1965/03/31) on 02/06/24 at  2:00 PM EST by a video-enabled telemedicine application and verified that I am speaking with the correct person using two identifiers.  Location: Patient: Virtual Visit  Location Patient: Home Provider: Virtual Visit Location Provider: Home Office   I discussed the limitations of evaluation and management by telemedicine and the availability of in person appointments. The patient expressed understanding and agreed to proceed.    History of Present Illness: Jonathon Walker is following up 2 weeks after his initial tobacco counseling session. AT last visit, he was really pushing to quit without the use of medications. Quit date was 01/23/2024. So far, since then, he has not had any further cigarettes. Has had an occasional craving but able to manage it without the need for the Nicorette gum. Notes sleeping well and no major change to appetite. Rare irritable mood, more so right after quitting, much improved now. Feels overall mood remains stable on his ongoing treatment from [facility].    Observations/Objective: Patient is well-developed, well-nourished in no acute distress.  Resting comfortably  at home.  Head is normocephalic, atraumatic.  No labored breathing.  Speech is clear and coherent with logical content.  Patient is alert and oriented at baseline.    Assessment and Plan: 1. Nicotine  dependence, cigarettes, in remission (Primary)  - Patient is currently at the following State of Change: Maintenance -- achieved smoking cessation - Comorbid conditions identified: GAD/MDD, OSA, MVP - Reviewed impacts of smoking on patient's health. - Recent heart cath -- Normal coronary anatomy, mildly elevated LV filling pressure and R hear pressure. Normal cardiac output. DX mild pulmonary hypertension -- planned for CT surgery for MV repair.  - Quit date -- 11/4.  Continue current  regimen, having the Nicorette on hand in case of bad cravings.   - Other resources including the [employer] and Department of Health and [employer] -- have been included in the patient's written instructions and sent to their MyChart.  - Plan for follow-up in 4 weeks  Follow Up  Instructions: I discussed the assessment and treatment plan with the patient. The patient was provided an opportunity to ask questions and all were answered. The patient agreed with the plan and demonstrated an understanding of the instructions.  A copy of instructions were sent to the patient via MyChart unless otherwise noted below.    Time:  I have spent 10 minutes with the patient via telehealth technology in tobacco cessation counseling.    Jonathon Jonathon Lunger, PA-C

## 2024-02-06 NOTE — Patient Instructions (Signed)
 Jonathon Walker, thank you for joining Elsie [name], PA-C for today's virtual visit.  While this provider is not your primary care provider (PCP), if your PCP is located in our provider database this encounter information will be shared with them immediately following your visit.  Consent: (Patient) Jonathon Walker provided verbal consent for this virtual visit at the beginning of the encounter.  Current Medications:  Current Outpatient Medications:  .  amLODipine  (NORVASC ) 5 MG tablet, Take 1 tablet (5 mg total) by mouth daily., Disp: 90 tablet, Rfl: 3 .  atorvastatin  (LIPITOR) 80 MG tablet, Take 1 tablet (80 mg total) by mouth daily. (Patient taking differently: Take 40 mg by mouth daily.), Disp: 90 tablet, Rfl: 3 .  Cholecalciferol (VITAMIN D3) 125 MCG (5000 UT) CAPS, Take 5,000 Units by mouth daily in the afternoon., Disp: , Rfl:  .  lithium  carbonate 300 MG capsule, Take 3 capsules (900 mg total) by mouth at bedtime., Disp: 270 capsule, Rfl: 0 .  NON FORMULARY, Pt uses a c-pap nightly, Disp: , Rfl:  .  PARoxetine  (PAXIL ) 30 MG tablet, TAKE 1 TABLET(30 MG) BY MOUTH DAILY. STOP FLUVOXAMINE , Disp: 30 tablet, Rfl: 0 .  QUEtiapine  (SEROQUEL  XR) 400 MG 24 hr tablet, Take 1 tablet (400 mg total) by mouth at bedtime., Disp: 90 tablet, Rfl: 0   When you are getting low on your smoking cessation medications, schedule your next video appointment as previously discussed. This way we can follow-up with you, make any necessary adjustments/changes, and get next fill of medication sent in to your pharmacy.   Follow-Up: Next appointment scheduled for 12/16 at 2:00 PM. Please message me directly between no and then for any changes in how things are going! Congratulations on all of your hard work so far!  Other Instructions  Congratulations for your interest in quitting smoking!  Quitting smoking is one of the most important things you can do to protect your health.  We are here to help you! Did you  know that if you quit smoking 1 pack per day you could save up to $2550.00 per year?  Medications are not appropriate for everyone depending upon your situation and any health issues you may have. If we do prescribe medication, it will be for 1 month at time and you will be required to have a follow up Video visit at 1 month to assess how you are doing and if there are any side effects.  This could be for up to a total of 3 months.    Support from your friends, family and work colleagues is very important. Please let them know that you are trying to stop smoking so that they understand your need for support in this goal!  - Remove tobacco products from your environment - Set a quit date ideally within 2 weeks. - You should totally abstain from smoking after your quit date. A single puff could hurt your progress or cause you to relapse - If there are others in your household that smoke, ask them to try to quit or abstain from smoking in your presence  You may notice nicotine  withdrawal symptoms such as increased appetite and weight gain, changes in mood, insomnia, irritability and/or anxiety. These symptoms peak in the first three days after smoking cessation and subside over the next 3-4 weeks.     A combination of behavioral and medication can improve the success of you quitting smoking. You may want to use the 1-800-QUIT-NOW free support line.  Also, the Department of Health and Human Services provides Smoke free apps for smartphones: sharedcustomer.fi  If you happen to break your plan and have a cigarette, keep taking your medications and continue to try to abstain.  Do not give up!  MAKE SURE YOU  Take any prescribed medications only as instructed.  If you miss a dose of medication, take the next dose when it is due and get back on schedule. DO NOT double up on medications.  Mark your calendar to do your Follow Up Smoking Cessation Visit in one month   If you have  been instructed to have an in-person evaluation today at a local Urgent Care facility, please use the link below. It will take you to a list of all of our available [facility] [place] [facility], including address, phone number and hours of operation. Please do not delay care.  [facility]  If you or a family member do not have a primary care provider, use the link below to schedule a visit and establish care. When you choose a [place] primary care physician or advanced practice provider, you gain a long-term partner in health. Find a Primary Care Provider  Learn more about [place]'s in-office and virtual care options: [employer] - Get Care Now

## 2024-02-07 NOTE — Addendum Note (Signed)
 Addended by: GLADIS ELSIE BROCKS on: 02/07/2024 02:34 PM   Modules accepted: Level of Service

## 2024-02-12 NOTE — Progress Notes (Deleted)
 Cardiology Office Note:    Date:  02/12/2024   ID:  NASHUA HOMEWOOD, DOB [birth year], MRN [id number]  PCP:  Merna Huxley, NP   [facility] Providers Cardiologist:  Wilbert Bihari, MD Cardiology APP:  Madie Jon Garre, PA { Click to update primary MD,subspecialty MD or APP then REFRESH:1}    Referring MD: Merna Huxley, NP   No chief complaint on file. ***  History of Present Illness:    Jonathon Walker is a 58 y.o. male with a hx of severe MR by recent TEE, prior alcohol abuse (quit in 2020), tobacco abuse, anxiety/depression/BPD, HTN, HLD, and OSA on CPAP. Echo 2021 with mild to moderate MR, severe asymmetric septal hypertrophy, mild pulmonary HTN. Repeat echocardiogram 11/2023 showed progression of MR leading to cardiology referral and TEE 12/13/23 which confirmed myxomatous posterior leaflet of MV with P3 scallop flail and prolapse, severe MR.   He presents back to discuss right and left heart catheterization and referral to CT surgery. Heart cath showed normal coronary anatomy.   He was seen by CT surgery. Since he is asymptomatic, through shared decision making, he is deferring surgery for now.  He will follow up in 6 months with TTE with CT surgery.  He presents for follow up after heart cath.      Severe MR with prolapse of P3 - confirmed with TEE - will proceed with R/L HC - appears euvolemic    OSA Compliant with CPAP.    Hypertension Well-controlled.  Continue with amlodipine  5 mg daily.    Hyperlipidemia Continue atorvastatin  40 mg.  LDL 05/2023 was 79.    Depression Debilitating symptoms.  Follows with psychiatry. Now on disability.       Past Medical History:  Diagnosis Date   Alcohol abuse    Anxiety    Bimalleolar fracture of left ankle    Depression    Heart murmur    HLD (hyperlipidemia)    HTN (hypertension)    MVP (mitral valve prolapse)    moderate MVP of the middle scallop of the posterior MVL with moderate to severe  MR echo 11/2023   Sleep apnea    uses CPAP nightly   Tobacco use    Vitamin D  deficiency     Past Surgical History:  Procedure Laterality Date   ORIF ANKLE FRACTURE Left 09/19/2017   Procedure: OPEN REDUCTION INTERNAL FIXATION (ORIF) ANKLE FRACTURE;  Surgeon: Beverley Evalene JONETTA, MD;  Location: [facility];  Service: Orthopedics;  Laterality: Left;   RIGHT/LEFT HEART CATH AND CORONARY ANGIOGRAPHY N/A 01/25/2024   Procedure: RIGHT/LEFT HEART CATH AND CORONARY ANGIOGRAPHY;  Surgeon: Jordan, Peter M, MD;  Location: [facility] INVASIVE CV LAB;  Service: Cardiovascular;  Laterality: N/A;   TOOTH EXTRACTION     TRANSESOPHAGEAL ECHOCARDIOGRAM (CATH LAB) N/A 12/13/2023   Procedure: TRANSESOPHAGEAL ECHOCARDIOGRAM;  Surgeon: Loni Soyla LABOR, MD;  Location: [facility] INVASIVE CV LAB;  Service: Cardiovascular;  Laterality: N/A;    Current Medications: No outpatient medications have been marked as taking for the 02/13/24 encounter (Appointment) with Madie Jon Garre, PA.     Allergies:   Patient has no known allergies.   Social History   Socioeconomic History   Marital status: Single    Spouse name: Not on file   Number of children: Not on file   Years of education: Not on file   Highest education level: Some college, no degree  Occupational History   Not on file  Tobacco Use   Smoking  status: Every Day    Current packs/day: 1.50    Average packs/day: 1.5 packs/day for 48.9 years (73.3 ttl pk-yrs)    Types: Cigarettes    Start date: 03/22/1975   Smokeless tobacco: Never   Tobacco comments:    He reports he has quit 3 times and recently started back in 2012. Hsm     1/2 PPD  Vaping Use   Vaping status: Never Used  Substance and Sexual Activity   Alcohol use: Not Currently   Drug use: No   Sexual activity: Not on file  Other Topics Concern   Not on file  Social History Narrative   Not on file   Social Drivers of Health   Financial Resource Strain: Low Risk  (04/25/2023)   Overall  Financial Resource Strain (CARDIA)    Difficulty of Paying Living Expenses: Not very hard  Recent Concern: Financial Resource Strain - Medium Risk (02/14/2023)   Overall Financial Resource Strain (CARDIA)    Difficulty of Paying Living Expenses: Somewhat hard  Food Insecurity: No Food Insecurity (04/25/2023)   Hunger Vital Sign    Worried About Running Out of Food in the Last Year: Never true    Ran Out of Food in the Last Year: Never true  Transportation Needs: No Transportation Needs (04/25/2023)   PRAPARE - Administrator, Civil Service (Medical): No    Lack of Transportation (Non-Medical): No  Physical Activity: Inactive (04/25/2023)   Exercise Vital Sign    Days of Exercise per Week: 0 days    Minutes of Exercise per Session: 10 min  Stress: Stress Concern Present (04/25/2023)   [employer] of Occupational Health - Occupational Stress Questionnaire    Feeling of Stress : Very much  Social Connections: Moderately Isolated (04/25/2023)   Social Connection and Isolation Panel    Frequency of Communication with Friends and Family: Three times a week    Frequency of Social Gatherings with Friends and Family: Once a week    Attends Religious Services: Never    Database Administrator or Organizations: Yes    Attends Engineer, Structural: More than 4 times per year    Marital Status: Never married     Family History: The patient's ***family history includes Cancer in his maternal grandfather. There is no history of Colon cancer, Colon polyps, Esophageal cancer, Rectal cancer, or Stomach cancer.  ROS:   Please see the history of present illness.    *** All other systems reviewed and are negative.  EKGs/Labs/Other Studies Reviewed:    The following studies were reviewed today: ***      Recent Labs: 05/26/2023: ALT 39; TSH 2.17 01/18/2024: BUN 18; Creatinine, Ser 1.12; Platelets 228 01/25/2024: Hemoglobin 12.9; Potassium 4.1; Sodium 140  Recent Lipid Panel     Component Value Date/Time   CHOL 137 05/26/2023 1113   TRIG 116.0 05/26/2023 1113   HDL 34.90 (L) 05/26/2023 1113   CHOLHDL 4 05/26/2023 1113   VLDL 23.2 05/26/2023 1113   LDLCALC 79 05/26/2023 1113   LDLDIRECT 204.0 05/31/2018 1100     Risk Assessment/Calculations:   {Does this patient have ATRIAL FIBRILLATION?:[phone]}  No BP recorded.  {Refresh Note OR Click here to enter BP  :1}***         Physical Exam:    VS:  There were no vitals taken for this visit.    Wt Readings from Last 3 Encounters:  02/01/24 (!) 305 lb (138.3 kg)  01/25/24 295 lb (  133.8 kg)  01/18/24 296 lb 6.4 oz (134.4 kg)     GEN: *** Well nourished, well developed in no acute distress HEENT: Normal NECK: No JVD; No carotid bruits LYMPHATICS: No lymphadenopathy CARDIAC: ***RRR, no murmurs, rubs, gallops RESPIRATORY:  Clear to auscultation without rales, wheezing or rhonchi  ABDOMEN: Soft, non-tender, non-distended MUSCULOSKELETAL:  No edema; No deformity  SKIN: Warm and dry NEUROLOGIC:  Alert and oriented x 3 PSYCHIATRIC:  Normal affect   ASSESSMENT:    No diagnosis found. PLAN:    In order of problems listed above:  ***      {Are you ordering a CV Procedure (e.g. stress test, cath, DCCV, TEE, etc)?   Press F2        :[id number]}    Medication Adjustments/Labs and Tests Ordered: Current medicines are reviewed at length with the patient today.  Concerns regarding medicines are outlined above.  No orders of the defined types were placed in this encounter.  No orders of the defined types were placed in this encounter.   There are no Patient Instructions on file for this visit.   Signed, Jon Nat Hails, [place]  02/12/2024 7:24 PM    [facility]

## 2024-02-13 ENCOUNTER — Ambulatory Visit: Admitting: Physician Assistant

## 2024-02-13 ENCOUNTER — Telehealth: Payer: Self-pay | Admitting: *Deleted

## 2024-02-13 NOTE — Telephone Encounter (Signed)
 LMVOM offering earlier appointment (10:55am) with A. Duke, PA-C. Main office number provided. Will also send a MyChart message.

## 2024-02-14 ENCOUNTER — Ambulatory Visit (INDEPENDENT_AMBULATORY_CARE_PROVIDER_SITE_OTHER): Admitting: Psychiatry

## 2024-02-14 DIAGNOSIS — Z79899 Other long term (current) drug therapy: Secondary | ICD-10-CM

## 2024-02-14 DIAGNOSIS — G4733 Obstructive sleep apnea (adult) (pediatric): Secondary | ICD-10-CM

## 2024-02-14 DIAGNOSIS — F422 Mixed obsessional thoughts and acts: Secondary | ICD-10-CM

## 2024-02-14 DIAGNOSIS — F411 Generalized anxiety disorder: Secondary | ICD-10-CM | POA: Diagnosis not present

## 2024-02-14 DIAGNOSIS — F3181 Bipolar II disorder: Secondary | ICD-10-CM | POA: Diagnosis not present

## 2024-02-14 DIAGNOSIS — R7989 Other specified abnormal findings of blood chemistry: Secondary | ICD-10-CM

## 2024-02-14 DIAGNOSIS — F1021 Alcohol dependence, in remission: Secondary | ICD-10-CM

## 2024-02-14 DIAGNOSIS — E538 Deficiency of other specified B group vitamins: Secondary | ICD-10-CM

## 2024-02-14 MED ORDER — PAROXETINE HCL 40 MG PO TABS: 40.0000 mg | ORAL_TABLET | Freq: Every day | ORAL | 0 refills | Status: DC

## 2024-02-14 NOTE — Progress Notes (Signed)
 Jonathon Walker [id number] 29-Dec-1965 58 y.o.  Subjective:   Patient ID:  Jonathon Walker is a 58 y.o. (DOB 01-Nov-1965) male.  Chief Complaint:  Chief Complaint  Patient presents with   Follow-up   Anxiety   Depression   Medication Problem    Jonathon Walker presents to the office today for follow-up of anxiety , depression, alcohol.    seen August 7.  His anxiety was unmanaged at Paxil  40 mg and olanzapine  5 mg so olanzapine  was increased to 7.5 daily.  seen December 17, 2018.  His anxiety was unmanaged.  The following change was made: Trial 1-1/2 of the 7.5 mg tablets of olanzapine  for anxi.  He is had a stay at [facility] for alcohol dependence since he was last here. Stayed 28 days and DC before Thanksgiving.  Very helpful.  Was having NM nighly before and they stopped since then.  Not drinking.  Involved in AA since then.  seen March 25, 2019.  The patient was doing better requested a reduction in olanzapine  to 7.5 mg nightly to improve energy.  visit April 03, 2019.  He had remained sober and his anxiety was improved with sobriety.  He was having problems with low motivation and forgetfulness.  At the next refill it was decided olanzapine  would be reduced from 7.5 to 5 mg daily. He called requesting this urgent appointment because of worsening symptoms associated with returning to work.  seen May 21, 2019.  He was doing relatively well.  The following was noted: Tolerating Wellbutrin  and it seems to help. He didn't tolerate the increase. Anxiety is improved off alcohol.  Ok to reduce olanzapine  to 2.5 mg daily for a month and stop it.  Hopefully low motivation will improve.   He called back March 23 stating he was more depressed and having a harder time doing routine tasks and having suicidal thoughts.  Because of worsening depression after reducing the olanzapine  he was encouraged to increase olanzapine  back to 5 mg nightly. He called again March 24 stating he was  really struggling but was scheduled for an appointment on March 26.  As of June 14, 2019 he reports the following: Every day a real hard struggle to get through the days.  More anxious and depressed equally. Worse 2 weeks.  Last Saturday so overwhelmed by how he feels he had SI.  Everything is hard. Initial benefit paroxetine  has been lost.  Awakens stressed and tired. Adequate hours of sleep.  Will be major chore just to mow grass.  Still sober. SE sexual. Getting appt with CPAP doc. Changes made include: Increasing olanzapine  back to 5 mg daily a couple of days prior to the appointment due to phone call and the addition of lithium  300 mg for a few days then increase to 600 mg nightly both to augment the antidepressant and because of suicidal thoughts.  July 15, 2019 appointment, the following is noted: Doing OK with both depression and anxiety.  Kind of like a top always going in mind.  Even when I sleep, when awakens mind still going.  NM nightly for years but less than before stopped drinking.  Smaller and less severe ones that don't wake him.  Theme can't finish things.  Both depression and anxiety 3/10.  More productive.  Paces himself.  Can live with it.  Sleep 9 hours.  Initially olanzapine  stopped the ruminating but it's back some.  Overall still benefit.  No SI and can't rmember why he  had them before. Residual energy not great and some ruminating. He had some concerns about sexual side effects from Paxil  but agreed that no med changes were necessary despite residual symptoms of depression and anxiety.  08/14/2019 appointment, the following is noted:  He scheduled urgently. Vaccinated. Angry easily and exhausted and everything is a chore.  Exhausted. Never had appt with CPAP company.  CPAP is 59 years old and on same settings as in the past.  CPAP machine is not working properly but when it did it was helpful for alertness and energy.  Disc need to call them to have it evaluated. To bed  10-7.  Don't feel rested in morning but used to feel rested when first started paroxetine . Plan: Increase Lithium  continue to 900 mg daily for irritability  11/19/19 appt with the following noted: He increased to 900 mg and then reduced it but doesn't know why.  Reduced it to 300 nightly. Irritability much better.  Once anger since here with rage but not outward. Still on paroxetine  40, olanzapine  5, Wellbutrin  75 AM. Getting tired in afternoon and worrying about getting depressed but he's not depressed now.  Don't enjoy things like he used to.  Les interest and excitement. Appetite and sleep are normal.   Still adjusting without alcohol and socialization. Worrying about lack of sex drive with paroxetine . Can still ruminate on things until he gets some reassurance through talking with people. Plan: Continue low-dose lithium  300 mg daily Continue Paxil .  Did not feel well at 60 mg so we will continue 40 mg.  Likely to lose anxiety benefit if change it. Continue low-dose Wellbutrin  75  Tolerating Wellbutrin  and it seems to help. He didn't tolerate the increase.   Continue olanzapine  5 mg  02/18/2020 appointment with following noted: Open to trying increase paroxetine  again bc ruminating wears him out and gets fatigued and it wears him out though is 80% better vs before paroxetine . Doesn't remember trying higher paroxetine .  Ruminates on relationships or work.  Whenever he can talk about it with someone it helps tremendously but also realizes paroxetine  helps. Anxiety affects dreams and NM too.  Up and down a little.  Anxiety drove depression before paroxetine .  Best med he's tried. SOBER 1 YEAR SE no problem.  Except gained 25# in 4 years.  No change in diet and exercise.  No increase in appetite and no food cravings.  Seeing PCP soon. Plan: Increase paroxetine  trial to 60 for rumination.  Disc SE.  05/26/20 appt noted: No benefit increase paroxetine  nor SE.  Wants to reduce back to 30 mg  paroxetine  and stop lithium  bc it didn't seem to help.   4 days of Calm app has seemed to help rumination.  Does it in afternoon and before bed and it seemed to helps.  Doing a 10 min meditation and it helps.   Dep 4/10.  Anxiety 4/10. And a lot better the last few days.   Plan: Reduce lithium  by 1 tablet per week.  Call if there is any suicidal thought. Starting April 1 reduce paroxetine  to 40 mg daily. Continue low-dose Wellbutrin  75  Tolerating Wellbutrin  and it seems to help. He didn't tolerate the increase.   If doing OK will try taper as he suggested at next visit. Continue olanzapine  5 mg nightly it was helpful for depression and anxiety.  07/01/2020 appt with following noted: Moved appt up. Now says he understated depression level when he was here the last time. Restarted lithium  bc  felt more depressed without it. Reduced paroxetine  to 40 mg daily. Lethargic.  Mind won't calm down.  Nonrestorative sleep but 8 hours.  Doesn't think sleep is deep enough.  Tense. Goes to [employer]. Started counseling. Wants to try something different with meds. Can have mixed sx for 6 weeks with increased interest in things and motivation and want to spend money but still feels depressed.  3 times in the last year. Plan: Increase  lithium  back to 3 daily to see if depression is better and less mood cycling.  He admits to feeling worse when he stopped the lithium  Continue the reduce paroxetine  to 45 mg daily. DC Wellbutrin  Lamotrigine  trial 25 mg to 100 mg daily over 4 weeks. Continue olanzapine  5 mg nightly it was helpful for depression and anxiety.  08/26/2020 appointment with the following noted: Sad and down.  Classic depression sx and very fatigued.  Unusual to have classic depression bc usually is atypical.  No effect from lamotrigine . Wonders if atorvastatin  is causing some confusion or depression bc read about it. Depressed for 3 mos. Gained 20#.  It's leveled off now. Olanzapine  helped  the rumination and anxiety.   Life is more depressing bc not seeing friends like it was bc stopped drinking.   No SE with lithium .  Unless dropping things. Sleep 1030 to 6 but doesn't sleep deep.  Not rested in AM Plan: Increase  lithium  back to 3 daily to see if depression is better and less mood cycling.  He admits to feeling worse when he stopped the lithium  Continue the reduce paroxetine  to 40 mg daily bc it helped anxiety. Lamotrigine  increase to 150 mg daily DT no benefit or SE Increase olanzapine10 mg nightly it was helpful for depression and anxiety.  Need more help with depression   09/11/2020 phone call: Pt stated he is foegetting things throughout the day and very unsteady.He works with tools and equptment and this is a problem.He stated he takes 6 Lamictal  in the am. MD response: We made 3 med changes at the last visit including increasing lamotrigine  to 150 mg daily, increasing olanzapine  to 10 mg daily, and restarting lithium .  Any 1 of those changes could possibly cause some of the side effects.  We will have to make 1 change at a time to evaluate this.  Therefore reduce lamotrigine  to 100 mg daily.  It may take a couple of weeks before he sees a difference.  11/13/2020 phone call: Patient lm stating he is currently taking Lamotrigine  150 mg. He has decreased it to 75 mg due to side effects per pt ( blurred vision, shaking and coordination issues). Patient stated he is discontinuing before his scheduled October appointment. MD response: Take the lamotrigine  75 mg for 2 weeks and he should be able to stop then without withdrawal.  If he gets shakey, nervous then we need to reduce more slowly and let us  know that.  Otherwise he can stop if he follows these directions.  11/30/2020 phone call: Pt called and advised he would like to taper off of the Olanzapine .  He has gained 8# since his last visit and 30# total since he started taking the med.  He feels steady, not so much stress now, and  would like to see if he can take Lorazepam instead.  Then talk to Dr. Geoffry in Oct when they meet. MD response: Reduce olanzapine  to one half of the 10 mg tablet at night for 4 weeks and then stop it.  If he gets more depressed then get back in touch with us  and we will try to do something about the weight gain that olanzapine  can cause by using the alternative Lybalvi  12/07/2020 appointment with the following noted: Off lamotrigine  .  Reduced olanzapine  to 5 mg for a week.  On lithium  900 mg HS, paroxetine  40 Fatigue 10/10.  Dizziness resolved off lamotrigine .  Low fatigue and motivation.  I want to want to do things.  Stopped drinking almost 18 mos and socially big adjustment.   Still invloved with AA and daily sponsor.  Working [employer].  Haroldine Young helped. Thinks mild depression with little interest and motivation and no joy.  But not severe.  No SI. No anxiety and it's way better.  Life is more stable and helps.  Working 4 hours daily and occ extrea jobs. Uses new CPAP.  Stil drowsy and fatigue. Plan: Continue CPAP use. New CPAP machine. Trial modafinil  100-200 mg AM Lamotrigine  off DT NR Reduced olanzapine  to 5 mg daily for a week and well stop in 3 weeks DT wt gain and fatigue  01/20/2021 phone call: I called patient to see if I could get better information. He states he started modafinil  a couple of weeks ago, though Rx was written for in September. He states he didn't feel well before taking the modafinil  though. He just says he is agitated, irritable, and unstable feeling. When asked what he meant by unstable he stated feeling a shell of myself.  He has been put on the cancellation list.  MD response: It is likely the modafinil  that is causing this problem.  But it may be corrected with a dose reduction.  I assume he is taking a whole tablet.  If that is the case time to reduce to 1/2 tablet in the morning.  If he is taking half a tablet tell him to reduce it to a quarter of a tablet.   And I agree we need to try to get him in as soon as possible. He reduced modafinil  from 200 mg to 100 mg daily.  02/10/2021 appointment with the following noted: Current psych meds lithium  900 mg nightly, paroxetine  40 mg daily, olanzapine  recently stopped, modafinil  recently tried. Was really bad for awhile feeling depresssed and agitated and anxiety but better now including after reducing modafinil  to 100 mg daily. Fatigue is better and in the middle now with energy, not good but not bad. Sleep is better.  CPAP doctor yesterday and they are gathering info pending. NAC is helping with memory. Still sober and committed to it. SE constipation  Plan: No med changes  03/10/2021 phone call from patient complaining of vague feelings of not feeling well and wanting the Paxil  changed.  He was placed on the cancellation list.  03/18/2021 appointment urgently scheduled at his request: Remains on paroxetine  40 mg daily, lithium  1200 mg daily, modafinil  200 mg every morning. Ruminating  with sense of need to talk to someone.  Then feels better for awhile.  Mostly on relationships with family.  No one to talk to. Wonders need to chang ethe meds.   Don't socialize anymore since sober.  Does online AA everyday at noon. Lost 10# off olanzapine . Plan: Continue paroxetine  40 mg, lithium  1200 mg daily, Try stopping modafinil  100mg  AM to determine if it is helping or not. If not less anxious then start risperidone  for rumination 1-2 mg HS  04/05/2021 appointment with the following noted: Stopped  modafinil  and it helped  reduce anxiety. Started risperidone  2 instead of 1 mg and was off balance so reduced risperidone  to 1 mg HS. Only done this for 3 nights.  No SE with it. Anxiety is not close to as bad as originally.  Can be painful but not now. Still ruminating some my whole adult life but varies.  Anxiety can lead to depression. 6-7/10 with 10 good on depression with some problems with  ambition. Plan: Continue trial risperidone  for rumination 1 mg HS  04/07/2021 TC: Several phone calls: Called 2 days after the visit complaining of itching from the lithium  and wanting to come off of it.  He was warned he was more emotionally distressed when he reduced the lithium  in the past but he wanted to pursue it anyway.  He was continuing risperidone  1 mg nightly but wanting to reduce that also but was warned he would almost certainly have worsening mood severity and anxiety problems based on past experience.  He was cautioned against reducing lithium  below 900 mg daily and risperidone  below 1 mg nightly.  He agreed on 04/09/2021.  04/09/2021 he called again and several phone calls ensued thereafter.  He insisted on stopping lithium  and risperidone  despite warnings that his mood stability and anxiety would worsen. He was prescribed Depakote  to take the place  04/20/2021 appointment with the following noted: He stopped risperidone  and did not take the Depakote .  He is taking lithium  900 mg nightly and paroxetine  40 mg daily. Stopped risperidone  bc feeling agitated and unstable and blamed it.  Still feels the same off of it. Says itching 85% better with less lithium .  Itching for a couple of mos.  Doesn't think anxiety noticeably worse after reducing lithium  to 900 mg daily. Not dizzy.  Clumsy with hands. Anxious but not painful like it was before Paxil  but tense at the end of the day.  Initially felt normal for the first time in 20 years when first started paxil  but not as good now. Not sadness and hasn't had much of it. Started therapy with doctor on demand. Ruminating on fear of not being fully present and worries about little things like appts pending.   Afraid of working FT because gets overwhelmed bc I take it home with me Plan : Continue trial risperidone  but increase to rumination 1.5 mg HS Continue paroxetine  40 and lithium  600 mg   06/01/2021 appointment with the following  noted: Haven't seen benefit with risperidone  1.5 mg daily. Not in pain but feels disconnected and not myself.  Has felt like this off and on for years. Level of angst all day but not real high unless triggered.  Still has the rumination. Initially a lot of benefit from paroxetine  Not markedly deprssed and not sad.  07/14/21 appt noted:  Doesn't feel much different with switch to fluvoxamine  except anxiety well controlled. But still low energy, motivation and not as productive as he wants to be.  Not markedly sad.  Asks about Vraylar . Not much interest or enjoyment. Don't sleep that great and don't relax that great. On fluvoxamine  200, lithium  900, off risperidone . Plan: Anxiety controlled by fluvoxamine  200 Potentiate Vraylar  1.5 mg daily  07/27/2021 phone call complaining of ongoing depression.  Vraylar  increased to 3 mg daily. 07/29/2021 phone call complaining of cost of Vraylar .  Was given samples. 08/20/2021 phone call complaining of fatigue.  Stated he stopped the Vraylar  because it made him agitated.  It was recommended there be no further med changes because he is only been  off the Vraylar  for 1 week.  08/30/2021 appointment noted: Vraylar  1.5 mg didn't do much.  Increased to 3 mg and then felt irritable ans stopped it about 3rd week of May.  Better now. Still extremely tired and feels physically sick with normal workup. Thinks his mind wears him out.  Racing negative thoughts all the time. Asks about Ativan. Always nervous and anxious.  Trouble discerning anxiety from depression. Plan: Anxiety was controlled by fluvoxamine  200 but now he thinks it is worse. So increase 250 mg daily  10/18/21 appt noted: No results with increase fluvoxamine .  No SE. Anxiety is still worse than depression.  Thinks it causes somatic sx like the flu.  Exhausted. GI. Poor sleep and concentration. Asks about lorazepam.   Got checked out by PCP without findings. Plan: Reduce lithium  to 2 capsules daily to  see if physical symptoms like fatigue etc. are better Increase fluvoxamine  to 1 tablet in the morning and 2 tablets in the evening to reduce anxiety Plan Trial reduction of  lithium  900  to 600 mg daily to see if somatic sx are better like fatigue.   01/06/22 appt noted Everything is about the same with med changes. CC anxiety and fatigue.  Sx started with severe depression in 20s and got better but never resolved. Thinks anxiety is causing fatigue.  Has to nap about 1 pm.  Easily fatigued.  No particular worry other than how he feels and himself.. Plan: Reduce fluvoxamine  gradually to 150 mg daily Start clomipramine  25 mg nightly for 5 days and increase to 75 mg HS  02/07/22 TC:  Pt lvm that the clomiprame 25 mg has been taking the full dose for two weeks now. He is experiencing mania. Overspending etc    MD resp:  Increase the lithium  back to 3 daily.  Stop the clomipramine .  Resume the risperidone  until these symptoms clear up.  Call us  back next week and let us  know how it is going.     02/16/22 TC:   Pt was just calling to give you an update.He stated he is still 50 percent manic but feels better.He is currently taking   lithium  carbonate 300 MG 3 daily risperiDONE  3 mg 1/2 tab daily fluvoxamine  100 MG tablet 1/2 tablet in the AM and 1 tablet at night On 11/20 changes were made,you had him stop clomipramine                      and increase lithium  to 900 mg daily.  02/16/22 MD: check lithium  level.  02/24/22 tC complaining of depression. MD :  He has had recent mood swings from manic to depressed by his report.  Lithium  level is low at 0.5 on 600 mg daily.  I think the most helpful thing for his mood would be to increase lithium  back to 900 mg daily.      03/07/22 TC: pt reported increasing his Luvox  to 200 mg daily.  03/08/22 appt noted: Multiple phone calls since here.   Clomipramine  triggered mania like he's never had before.  Increased over the top looking for a house,  wanting to invent things, bought a shed and fish tank abruptly.  Left his job which was a good thing.  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  I feel a lot better about it.  Has disability and will do PT work.  I'll be OK. Exhausted and can't be on his feet all the time.  Sleep is different with EMA and ready to go but watches TV awhile and then goes back to sleep.  Always a late sleeper all his life.   Still feeling some depression and he increased the luvox  to 200 and it helped. Thinks parents are Autistic and don't respond emotionally.  M cannot understand feelings and he doesn't get any support.   Plan: Bc recent mania with clomipramine  increase lithium  1200  mg daily .    03/29/2022 appointment urgently due to recent mania: Psych meds: fluvoxamine  200, lithium  1200, risperidone  1.5 mg HS Exhausted until the last couple of days. SE tremor Has not had any water  today and is lightheaded.   Mood a little better and anxiety better than it was.  No mania. Some underlying weight in mood that only responded to paroxetine  and clomipramine . Asks about Wellbutrin  for energy. Sleep normal 8-9 hours now.  Except last night. Goes to [facility] groups and support group and AA. Sober Reduce risperidone  to 1 mg to see if energy better  05/25/22 appt noted: Reduced lithium  about 10 days ago bc thought he might be getting too much bc read on the internet.  No changes since reducing it. More dep for a couple of mos which is unusual.  In bed a lot and more isolated and not normally what I have.  Not real sad but unmotivated and no energy to do things.    Start therapist Friday. No sig sadness.  Irritable and short with people over little things.  Kind of started after stopped drinking.  Worse.  Sleep on and off.  Erratic.  Looks for ward to night so can escape into sleep.   Anxious underlying for 30 years.  Hard to sit and relax and just watch TV.  Tends to ruminate. Asks to try Gaba. Now lethargy is worse  than chronic anxiety Reducing risperidone  made no changes good or bad. DC risperidone  1 mg HS Add Latuda  1/2 tablet for 1 week then 1 tablet in the the evening  07/06/22 appt :  Increased Latuda  up to 100 mg .  No clear SE. Exhausted mentally and it makes his body tired.  Working 10-2 and then comes home and lays in bed a couple of hours.  Not a deep sleep.  Never feels fully rested.  Sleep HS 10-7.   Normal appetitie.   Still some sadness and irritability.  Anxiety doesn't seem better but is having less mood swings and mania.  Anxiety wears him down.   Plan: Increase  Latuda  120 mg in the evening with food  07/21/22 appt noted: Trouble keeping mood chart.  If I talk and I'm heard then feels better for hours but then damn stopped up.  Almost like an obsessive thing to talk for relief. No clear effect of increase Latuda  except reduced som cognitive anxiety but not physical anxiety. Says there was med he took before paxil  that gave him instant relief and wonders about retrying it.  Doesn't know which med. Paxil  had best effect when it worked for a period of time. Exhausted but fidget when sits.   Dep and anxious moderate.  09/01/22 appt noted: Meds clonazepam  0.5 mg BID prn, fluvoxamine  200 mg daily, lithium  900 HS, latuda  120 mg daily, vit D3 This type of dep since November after mania is low energy, anhedonia, low appetite, a lot of time in bed, textbook depresssion, lack of interest but not debilitating sadness.   Tired of it.  Dep is worse than anxiety  and used to be the other way around.  Not as irritable. In past dep was anxious and disconnected and wasn't as tired.  Had to leave work bc can't get himself out of bed.   Toss and turn in bed fidgety in bed, feels like has to move but no energy to walk.  Fidgety for 3 mos. Clonazepam  takes the edge off but feels a higher dose is needed.  Plan: Changes: Reduce Latuda  to 60 mg daily. Start samples of Auvelity  1 in the AM for dep that is  TRD  10/20/22 appt noted: Meds clonazepam  0.5 mg BID prn, fluvoxamine  200 mg daily, lithium  900 HS, latuda  60 mg daily, vit D3, out of Auvelity  after 2 weeks. Less restless but still has it 6/10 after reduced Latuda .  Thinks he had it before eLatuda but not sure. Takes an hour to get to sleep.   No change in dep from this visit to last visit. Did have positive effects from Auvelity  but ? SE conc.  Did help energy Lately more dep than anxiety.  Noted ASA helps anxiety. Couple times situational anger but no mood swings. Plan: Changes: Reduce fluvoxamine  to 1 and 1/2 tablet daily to see if restlessness better Reduce Latuda  from 60 to 40 mg daily to see if restlessness better Retry Auvelity  1 in the AM for 7 days then 1 twice daily  11/14/22 appt noted: Twitching and restlessness better.   Still some teeth grinding but better Auvelity  didn't help mood.  Ongoing classic lethargy, poor appetite.  Can't exercise bc depression.  Little tasks overwhelming.    clonazepam  0.5 mg BID prn, fluvoxamine  150 mg daily, lithium  900 HS, latuda  40 mg daily, vit D3,  Auvelity  BID SE constipation and bruxism. Sleep fair and best and most relaxed 730-930 AM.  To bed 10 pm.   12/26/22 appt noted: More relaxed if not underpressure but not working and needs to. Not much motivation or energy in AM.  Doesn't notice the effects of Seroquel  for many hours, but it is relaxing med. For last 7 years hard time with motivation and capacity.   Happened years ago and then it resolved after 7 years with Wellbutrin  Plan: Changes none:  Continue 1/2 quetiapine  XR 150 mg PM.  This is helping him to feel more calm in the morning than he has felt in a very long time.  However he thinks now he is a little too subdued but does not want to do reduce the dose any further.  Does not tolerate a higher dose because it is too sedating.  01/09/23 TC:  Patient taking 75 mg of Seroquel  and sleeping great at night, but also sleeping a lot  during the day. He reports he doesn't want to be around people, is content to be at home not doing anything. He rates depression as 7/10, anxiety 5/10. Reports anxiety is better since starting Seroquel . He also takes: Fluvoxamine  150 mg at bedtime Lithium  900 mg at bedtime, though 1200 is prescribed. No longer taking clonazepam .    MD resp:  There are a limited number of options left to us  and it is important that each med be tried at his most effective dose.  Seroquel  is FDA approved for depression in doses of 150 mg and higher.  The low dose he is currently on is enough to help anxiety and his sleep but probably not enough to help depression.  We may need to go as high as 200 or 300 mg a day.  He is probably fearful of being too sedated at higher doses but this medicine does not necessarily make people more sleepy at higher doses.  I think she got to go up to the full quetiapine  XR 150 mg daily and give that a try until his appointment with me.      02/08/23 appt noted: Psych med: no Clonazepam  0.5 mg twice daily as needed anxiety, fluvoxamine  150 nightly, lithium  900 nightly, quetiapine  XR 150 at 3 pm Less am drowsiness taking Seroquel  in the afternoon. Chronic fatigue unchanged.  Rare days without it.  Can do one task daily.  Can work 1 and 1/2 hours daily.  Naps about 2 pm.   Seroquel  helps sleep and anxiety.   Easily overwhelmed if multiple tasks.   Feels much better in the AM than in years.  By noon is worn out.   Plan: Increase for TRD quetiapine  XR 200 mg PM.   03/23/23 appt noted: Meds as above except incr Seroquel  XR 200 mg daily. Anxiety better but still some dep with less motivation maybe 20% better. No sig SE except dryness. URI for 8 days. Sleep 10 hours per usual.   Much more relaxed with Seroquel . Plan: Increase further for TRD quetiapine  XR 300 mg PM.   05/02/23 appt noted: Multiple phone calls since here complaining of ongoing sx and med rxns. Incr and decrease  fluvoxamine  to 150 now 100 complaining of no benefit for anxiety at the higher dose and feeling somewhat agitated.   Current meds:.  Fluvoxamine  100 nightly, lithium  900 nightly, quetiapine  ER 300 mg nightly. Felt annoyed, angry , agitated without reason and called.   Is a little better the last 10 days.  Not nearly as angry SE wt gain and constipation. Asks about switch to paxil  bc helped the most of anything.   Speaking to counselor.  Fearful childhood. Plan: Increase quetiapine  ER  to  200 mg 2 tablets in evening for 3 weeks then 3 tablets in the evening.   06/15/23 appt noted: Med: Seroquel  XR 600, lithium  900, fluvoxamine  150. Vit D 5000 helped Overall is a little better.  Less foggy.  Less dep. 30-40% better with combo. SE gained another 5# Vit D helped fatigue.  Still naps but less. Sleep is OK. Less angry but still has shortness.  But not as bad. Plan continue above longer  07/27/23 appt noted: Med as above 50-60% of himself.  Mind wears him out and then has to take a nap.  It's been a lot worse than this before but would like change meds to get better back to paroxetine .   No changes with increase Seroquel . Willing to come back to this if sx are not better with paroxetine .   Physical weakness is rough.  But doesn't work so doesn't have to do much .  Last year could work 4 hours daily but not now.  Get overwhelmed really quickly with little matters.  Gets irritable. Plan: Plan: Reduce fluvoxamine  to 1/2 tablet and add 1/2 paroxetine  for 5 days, Then stop fluvoxamine  and increase paroxetine  to 1 tablet daily.    08/16/23 TC:  Pt reported he was having bad leg cramps from his hips to his feet. He said he googled and read that taking Seroquel  and Paxil  could give him too much dopamine. He decreased Seroquel  from 600 mg to 300 mg and reports after 2 days the cramps went away, he is more relaxed, less agitated, and is sleeping well. He is taking: Paxil  30  Lithium  900 Seroquel  300  He  doesn't need/isn't asking for anything, just wanted to make you aware.        6+/18/25 appt noted:  Med:  Seroquel  XR 300 pm, paroxetine  30, no fluvoxamine  , lithium  900 HS.  Helped leg cramps with less Seroquel  XR and better mental clarity.   Paxil  helped anxiety tremendously.   Still dealing with depression.  Exhausted by 1 pm and naps.   Sleep regular hours but not restful.  Moderate helpful.  9 hours.  CPAP. SE no sig other than constipation and hard to lose wt. But not gaining. Not enough fluids and gets lightheaded.   Plan: Plan: Reduce Seroquel  XR to 200  09/15/23 TC:   10/18/23 appt noted:  Med:  Pt lvm 6/26 @ 8:00 pm. Pt stated he decreased to 200 mg Seroquel  last week and 6/25 had mild mania. Gone now. Should he stay with 200 mg or increase back to 300 mg.    resp:  Patient reporting he did have one day of mania on reduced dose of Seroquel  and he went back up to 300 mg and he is doing better.     09/21/23 TC:  Pt had called 6/27 and left message that he had reduced Seroquel  to 200 mg. He reported an episode of mania and went back to 300 and was doing better.  Today he is reporting he feels like he is going to lose his marbles. I asked for examples and he said he didn't feel stable. He said he had a lot of 200 mg tablets and asked if he could go back to 400 mg, or if you had another suggestion. He said he had to decrease dose because his legs locked up. When asked he has no active SI, but said he wish he wasn't here. He did say that he was going to be really busy until 1:00 today and to leave him a message if I called and he didn't answer.     MD resp:  Take 300-400 mg HS of Seroquel  nightly.  Stay hydrated and take 1 magnesium tablet daily to prevent cramps.  We can discuss other options at his next appt if needed    Lorene Macintosh, MD, [facility]  10/18/23 appt noted:  Med:  Seroquel  XR 400 pm, paroxetine  30,  lithium  900 HS.  Better with 400 mg vs lower, resolved hypomania with  buying.  Not wasteful buying.   2 brothers on Seroquel .  One of them on disability with OCD but passed away. Paxil  very helpful.  More at peace. Get some tiredness in afternoon and has to nap 90 mins.  Done this for years. Normal sleep at night, but maybe not as refreshed, like something is on my mind for the last 6 mos.  Feels like he is half awake at night.  It is bearable.  But asks about Ambien. Mood and anxiety better than a long time.   Plan: continue Seroquel  XR 400, paroxetine  30, lithium  900.  No changes today But check lithium  level.  12/06/23 appt Med:  Seroquel  XR 400 pm, paroxetine  30,  lithium  900 HS.  Not going that good.  Mind running and tired and some mild mania.  Reduced conc, some dep.   Exhausted and has to nap in afternoon after 8 hours at night.   Anxiety is pretty high with rumination.  Helps to talk to people.   Knows we tried a variety of other meds.  Asks maybe to retry Vraylar .  Reviewed history Dwells on fear of how he feels.  If talks to someone about it it is relieved but then recurs after 3 days. Wonders if it is OCD.   Plan: retry Vraylar  1.5 mg capsule daily for 1 week then 3 mg daily for 3 weeks, then if no benefit then increase to 4.5 mg  (or 3 mg +1.5 mg ).  If worsening mania then increase faster.  12/26/23 TC:  Pt left vm saying he is having some intolerable leg jerking and pain.   He reported he had noticed toe tapping previously, but felt he could live with it. Reported the jerking was bad last night, he was unable to relax and had trouble sleeping. Reporting the jerking also in his arms, but worse in his legs. He has had these complaints previously and is relating them to Vraylar .  MD resp:  Stop Vraylar  and wait until SE resolve.  Then resume lower dose of 3 mg daily until appt.    Lorene Macintosh, MD, DFAPA       02/14/24 appt noted:  Med:  Seroquel  XR 400 pm, paroxetine  30,  lithium  900 HS. , Vraylar  stopped but partly DT cost Reports  compulsive confession issues.  Will feel a lot better briefly with confession and has for the last month.  He remembers this back teen years.  His B had severe OCD.  Concerned over wt gain but usually it has stopped.  Dx MVR 30% but will monitor. No longer manic sx Overall sx mod dep sx and anxiety better .  But knows OCD will flare up again.    On disability for 7-8 years.  Working PT Before treatment did not sleep and RX Ambien and Wellbutrin  SP fellowship hall for alcohol.  Sober 2 year 01/2019  Past psych meds:   Paxil  60 too much + wt gain,   duloxetine, Zoloft,  Viibryd 60 partial resp (trial after paroxetine ) SE diarrhea. Trintellix 10 agitated Wellbutrin  75 (max tolerated),   Fluvoxamine  300 no better than 200 which seems to take edge off anxiety Clomipramine  mania Auvelity  4 weeks NR  Abilify, Rexulti 2. Vraylar  irritable at 3 mg daily. Retry 4.5  mg akathisia Latuda  120 fidgety Olanzapine  10 fatigue Risperdal  2 once Seroquel  XR 600 benefit partial; 200 daily lost response  Buspar 30 BID stopped DT partial effect.  Xanax craving,   lithium   900, worse when he stopped lithium  Lamotrigine  NR CO dizzy  NAC 600 helped Modafinil  200 agitated Took lorazepam  in the past, Xanax less helpful Ambien worked.    B severe OCD obsessions psych tx.  Review of Systems:  Review of Systems  Constitutional:  Positive for fatigue. Negative for unexpected weight change.  Cardiovascular:  Negative for palpitations.  Gastrointestinal:  Positive for constipation. Negative for nausea.  Neurological:  Negative for tremors.  Psychiatric/Behavioral:  Positive for dysphoric mood. Negative for agitation, behavioral problems, confusion, decreased concentration, hallucinations, self-injury, sleep disturbance and suicidal ideas. The patient is nervous/anxious. The patient is not hyperactive.     Medications: I have reviewed the patient's current medications.  Current Outpatient  Medications  Medication Sig Dispense Refill   amLODipine  (NORVASC ) 5 MG tablet Take 1 tablet (5 mg total) by mouth daily. 90 tablet 3   lithium  carbonate 300 MG capsule Take 3 capsules (900 mg total) by mouth at bedtime. 270 capsule 0   QUEtiapine  (SEROQUEL  XR) 400 MG 24 hr tablet Take 1 tablet (400 mg total) by mouth at bedtime. 90 tablet  0   atorvastatin  (LIPITOR) 80 MG tablet Take 1 tablet (80 mg total) by mouth daily. (Patient taking differently: Take 40 mg by mouth daily.) 90 tablet 3   Cholecalciferol (VITAMIN D3) 125 MCG (5000 UT) CAPS Take 5,000 Units by mouth daily in the afternoon.     NON FORMULARY Pt uses a c-pap nightly     PARoxetine  (PAXIL ) 40 MG tablet Take 1 tablet (40 mg total) by mouth daily. 90 tablet 0   No current facility-administered medications for this visit.    Medication Side Effects: sexual, weight  Allergies: No Known Allergies  Past Medical History:  Diagnosis Date   Alcohol abuse    Anxiety    Bimalleolar fracture of left ankle    Depression    Heart murmur    HLD (hyperlipidemia)    HTN (hypertension)    MVP (mitral valve prolapse)    moderate MVP of the middle scallop of the posterior MVL with moderate to severe MR echo 11/2023   Sleep apnea    uses CPAP nightly   Tobacco use    Vitamin D  deficiency     Family History  Problem Relation Age of Onset   Cancer Maternal Grandfather    Colon cancer Neg Hx    Colon polyps Neg Hx    Esophageal cancer Neg Hx    Rectal cancer Neg Hx    Stomach cancer Neg Hx     Social History   Socioeconomic History   Marital status: Single    Spouse name: Not on file   Number of children: Not on file   Years of education: Not on file   Highest education level: Associate degree: occupational, scientist, product/process development, or vocational program  Occupational History   Not on file  Tobacco Use   Smoking status: Former    Current packs/day: 0.00    Average packs/day: 1.5 packs/day for 48.0 years (72.0 ttl pk-yrs)    Types:  Cigarettes    Start date: 03/22/1975    Quit date: 2025    Years since quitting: 0.9    Passive exposure: Past   Smokeless tobacco: Never   Tobacco comments:    He reports he has quit 3 times and recently started back in 2012. Hsm     1/2 PPD  Vaping Use   Vaping status: Never Used  Substance and Sexual Activity   Alcohol use: Not Currently   Drug use: No   Sexual activity: Not on file  Other Topics Concern   Not on file  Social History Narrative   Not on file   Social Drivers of Health   Financial Resource Strain: Low Risk  (02/18/2024)   Overall Financial Resource Strain (CARDIA)    Difficulty of Paying Living Expenses: Not very hard  Food Insecurity: No Food Insecurity (02/19/2024)   Hunger Vital Sign    Worried About Running Out of Food in the Last Year: Never true    Ran Out of Food in the Last Year: Never true  Transportation Needs: No Transportation Needs (02/19/2024)   PRAPARE - Administrator, Civil Service (Medical): No    Lack of Transportation (Non-Medical): No  Physical Activity: Inactive (02/19/2024)   Exercise Vital Sign    Days of Exercise per Week: 0 days    Minutes of Exercise per Session: 0 min  Stress: No Stress Concern Present (02/19/2024)   [employer] of Occupational Health - Occupational Stress Questionnaire    Feeling of Stress: Only a little  Social Connections: Socially Isolated (02/19/2024)   Social Connection and Isolation Panel    Frequency of Communication with Friends and Family: More than three times a week    Frequency of Social Gatherings with Friends and Family: Three times a week    Attends Religious Services: Never    Active Member of Clubs or Organizations: No    Attends Banker Meetings: Never    Marital Status: Never married  Intimate Partner Violence: Not At Risk (02/19/2024)   Humiliation, Afraid, Rape, and Kick questionnaire    Fear of Current or Ex-Partner: No    Emotionally Abused: No    Physically  Abused: No    Sexually Abused: No    Past Medical History, Surgical history, Social history, and Family history were reviewed and updated as appropriate.   Please see review of systems for further details on the patient's review from today.   Objective:   Physical Exam:  There were no vitals taken for this visit.  Physical Exam Constitutional:      General: He is not in acute distress.    Appearance: Normal appearance. He is well-developed.     Comments: Red face  Musculoskeletal:        General: No deformity.  Neurological:     Mental Status: He is alert and oriented to person, place, and time.     Motor: No tremor.     Coordination: Coordination normal.     Gait: Gait normal.  Psychiatric:        Attention and Perception: Attention normal. He is attentive.        Mood and Affect: Mood is anxious and depressed. Affect is not labile, blunt, angry or inappropriate.        Speech: Speech normal.        Behavior: Behavior normal. Behavior is not agitated or slowed.        Thought Content: Thought content normal. Thought content is not delusional. Thought content does not include homicidal or suicidal ideation. Thought content does not include suicidal plan.        Cognition and Memory: Cognition normal.        Judgment: Judgment normal.     Comments: Insight  Fair. Continued mood problems  Sx worse again.  Obsessive vs ruminative on himself.       Lab Review:     Component Value Date/Time   NA 140 01/25/2024 0906   NA 139 01/18/2024 1501   K 4.1 01/25/2024 0906   CL 104 01/18/2024 1501   CO2 21 01/18/2024 1501   GLUCOSE 88 01/18/2024 1501   GLUCOSE 106 (H) 05/26/2023 1113   BUN 18 01/18/2024 1501   CREATININE 1.12 01/18/2024 1501   CREATININE 1.01 03/01/2022 0904   CALCIUM  9.4 01/18/2024 1501   PROT 7.0 05/26/2023 1113   ALBUMIN 4.4 05/26/2023 1113   AST 18 05/26/2023 1113   ALT 39 05/26/2023 1113   ALKPHOS 97 05/26/2023 1113   BILITOT 0.4 05/26/2023 1113        Component Value Date/Time   WBC 9.2 01/18/2024 1501   WBC 6.4 05/26/2023 1113   RBC 5.10 01/18/2024 1501   RBC 4.81 05/26/2023 1113   HGB 12.9 (L) 01/25/2024 0906   HGB 15.9 01/18/2024 1501   HGB 15.2 05/05/2010 1049   HCT 38.0 (L) 01/25/2024 0906   HCT 46.2 01/18/2024 1501   HCT 44.8 05/05/2010 1049   PLT 228 01/18/2024 1501   MCV 91 01/18/2024 1501  MCV 89.6 05/05/2010 1049   MCH 31.2 01/18/2024 1501   MCH 30.4 05/05/2010 1049   MCHC 34.4 01/18/2024 1501   MCHC 34.7 05/26/2023 1113   RDW 11.9 01/18/2024 1501   RDW 13.1 05/05/2010 1049   LYMPHSABS 2.5 07/21/2022 1143   LYMPHSABS 1.8 05/05/2010 1049   MONOABS 0.8 07/21/2022 1143   MONOABS 0.4 05/05/2010 1049   EOSABS 0.6 07/21/2022 1143   EOSABS 0.2 05/05/2010 1049   BASOSABS 0.1 07/21/2022 1143   BASOSABS 0.0 05/05/2010 1049    Lithium  Lvl  Date Value Ref Range Status  12/01/2023 0.8 0.6 - 1.2 mmol/L Final  02/22/23 lithium  0.7 on 900 mg daily. 04/01/22 lithium  level 0.9 on 1200 mg daily  02/02/21 lithium  level 0.6 on 900 mg daily  05/2023 vit D 30 started 5000 units helping fatigue  No results found for: PHENYTOIN, PHENOBARB, VALPROATE, CBMZ   .res Assessment: Plan:     Devaris was seen today for follow-up, anxiety, depression and medication problem.  Diagnoses and all orders for this visit:  Mixed obsessional thoughts and acts -     PARoxetine  (PAXIL ) 40 MG tablet; Take 1 tablet (40 mg total) by mouth daily.  Bipolar II disorder (HCC)  GAD (generalized anxiety disorder) -     PARoxetine  (PAXIL ) 40 MG tablet; Take 1 tablet (40 mg total) by mouth daily.  OSA (obstructive sleep apnea)  Lithium  use  Low vitamin D  level  Low serum vitamin B12  Alcohol dependence in remission (HCC)      Chronic recurrent rumination with anxiety and urgency and sense of need and instablity with impatience ongoing with frequent phone calls between appts. but  but anhedonic depression and anxious.  Needs a  lot of reassurance. Chronic worry and overwhelmed easily and misattributes sx as SE of meds leading to inadequate med trials.  Fear of meds.Falsely attributes his psych sx to the meds and then stops meds AMA.  False attribution of sx as SE.  But overall this is less of a problem than it was.  he describe history other manic cycling symptoms including increased interest, increased goal-directed behaviors, increased urge to spend that will last for 6 weeks or so and then stop.  He has these cycles about 3 times a year.  This is suggestive of a bipolar predisposition which was confirmed by recent cycle into mania from clomipramine .  Explained bipolar disorder underlying as reason never responded consistently to AD alone.  Anhedonic dep and anxiety better with Seroquel  400 and paroxetine  30 added.  Option pramipexole but difficult to use with quetiapine .   Continue CPAP use. New CPAP machine.  Discussed potential metabolic side effects associated with atypical antipsychotics, as well as potential risk for movement side effects. Advised pt to contact office if movement side effects occur.  He agrees.  Call if you have any problems with this. Failed attemp to reduce Seroquel  below 400 mg daily. Consider alternative Caplyta.    He remains sober.    Bc late 2023 mania with clomipramine  increased lithium  1200  mg daily .   He cut it back to 900 mg daily.  He admits to feeling worse when he stopped the lithium .  Call if sx get worse. Counseled patient regarding potential benefits, risks, and side effects of lithium  to include potential risk of lithium  affecting thyroid  and renal function.  Discussed need for periodic lab monitoring to determine drug level and to assess for potential adverse effects.  Counseled patient regarding signs and symptoms of  lithium  toxicity and advised that they notify office immediately or seek urgent medical attention if experiencing these signs and symptoms.  Patient advised to  contact office with any questions or concerns. 02/02/21  02/02/21 lithium  level 0.6 on 900 mg daily, normal B12 and vitamin D  52 04/01/22 lithium  level 0.9 on 1200 mg daily  02/02/21 lithium  level 0.6 on 900 mg daily 02/22/23 lithium  0.7 on 900 mg daily. 12/01/23 0.8 on 900 mg daily.  Disc the off-label use of N-Acetylcysteine at 600 mg daily to help with mild cognitive problems.  It can be combined with a B-complex vitamin as the B-12 and folate have been shown to sometimes enhance the effect.  Partial benefit so continue NAC 1200 mg daily.  Continue vitamin D  DT history level 30  Constipation management 1.  Lots of water  2.  Powdered fiber supplement such as MiraLAX, Citrucel, etc. preferably with a meal 3.  2 stool softeners a day 4.  Milk of magnesia or magnesium tablets if needed  Plan: continue Seroquel  XR 400, incrase paroxetine  40 for OCD, continue lithium  900.    Talk with insurance rep about Caplyta In general Medicare D plans cover costs of brands better than Medicare Advantage plans  Try to stay as active as possible.  Encourage [place] support group.  Disc therapy for compulsive confession.  Please see After Visit Summary for patient specific instructions.  FU 8 weeks  Lorene Macintosh, MD, DFAPA     Future Appointments  Date Time Provider Department Center  03/05/2024  2:00 PM [facility] PROVIDER [facility] None  03/27/2024 10:30 AM Cottle, Lorene KANDICE Raddle., MD [facility] None  05/08/2024 11:00 AM Cottle, Lorene KANDICE Raddle., MD [facility] None  02/24/2025 10:40 AM [facility]-ANNUAL WELLNESS VISIT [facility] [address]    No orders of the defined types were placed in this encounter.       -------------------------------

## 2024-02-14 NOTE — Patient Instructions (Addendum)
 Talk with insurance rep about Caplyta In general Medicare D plans cover costs of brands better than Medicare Advantage plans

## 2024-02-19 ENCOUNTER — Ambulatory Visit: Payer: Medicare Other

## 2024-02-19 VITALS — BP 124/62 | HR 75 | Temp 98.2°F | Ht 75.0 in | Wt 306.0 lb

## 2024-02-19 DIAGNOSIS — Z1211 Encounter for screening for malignant neoplasm of colon: Secondary | ICD-10-CM | POA: Diagnosis not present

## 2024-02-19 DIAGNOSIS — Z0000 Encounter for general adult medical examination without abnormal findings: Secondary | ICD-10-CM

## 2024-02-19 DIAGNOSIS — Z23 Encounter for immunization: Secondary | ICD-10-CM

## 2024-02-19 NOTE — Patient Instructions (Addendum)
 [name],  Thank you for taking the time for your Medicare Wellness Visit. I appreciate your continued commitment to your health goals. Please review the care plan we discussed, and feel free to reach out if I can assist you further.  Please note that Annual Wellness Visits do not include a physical exam. Some assessments may be limited, especially if the visit was conducted virtually. If needed, we may recommend an in-person follow-up with your provider.  Ongoing Care Seeing your primary care provider every 3 to 6 months helps us  monitor your health and provide consistent, personalized care.   Referrals If a referral was made during today's visit and you haven't received any updates within two weeks, please contact the referred provider directly to check on the status.  Recommended Screenings:  Health Maintenance  Topic Date Due   HIV Screening  Never done   Hepatitis B Vaccine (1 of 3 - 19+ 3-dose series) Never done   Zoster (Shingles) Vaccine (1 of 2) 03/05/1984   Colon Cancer Screening  08/30/2022   COVID-19 Vaccine (8 - Pfizer risk 2025-26 season) 05/19/2024   Screening for Lung Cancer  08/08/2024   Medicare Annual Wellness Visit  02/18/2025   DTaP/Tdap/Td vaccine (2 - Td or Tdap) 06/06/2029   Pneumococcal Vaccine for age over 6  Completed   Flu Shot  Completed   Hepatitis C Screening  Completed   HPV Vaccine  Aged Out   Meningitis B Vaccine  Aged Out       02/19/2024   10:52 AM  Advanced Directives  Does Patient Have a Medical Advance Directive? Yes  Type of Estate Agent of [place];Living will  Does patient want to make changes to medical advance directive? No - Patient declined  Copy of Healthcare Power of Attorney in Chart? No - copy requested    Vision: Annual vision screenings are recommended for early detection of glaucoma, cataracts, and diabetic retinopathy. These exams can also reveal signs of chronic conditions such as diabetes and  high blood pressure.  Dental: Annual dental screenings help detect early signs of oral cancer, gum disease, and other conditions linked to overall health, including heart disease and diabetes.  Please see the attached documents for additional preventive care recommendations.

## 2024-02-19 NOTE — Progress Notes (Signed)
 Chief Complaint  Patient presents with   Medicare Wellness     Subjective:   KIEREN ADKISON is a 58 y.o. male who presents for a Medicare Annual Wellness Visit.  Allergies (verified) Patient has no known allergies.   History: Past Medical History:  Diagnosis Date   Alcohol abuse    Anxiety    Bimalleolar fracture of left ankle    Depression    Heart murmur    HLD (hyperlipidemia)    HTN (hypertension)    MVP (mitral valve prolapse)    moderate MVP of the middle scallop of the posterior MVL with moderate to severe MR echo 11/2023   Sleep apnea    uses CPAP nightly   Tobacco use    Vitamin D  deficiency    Past Surgical History:  Procedure Laterality Date   ORIF ANKLE FRACTURE Left 09/19/2017   Procedure: OPEN REDUCTION INTERNAL FIXATION (ORIF) ANKLE FRACTURE;  Surgeon: Beverley Evalene JONETTA, MD;  Location: [facility];  Service: Orthopedics;  Laterality: Left;   RIGHT/LEFT HEART CATH AND CORONARY ANGIOGRAPHY N/A 01/25/2024   Procedure: RIGHT/LEFT HEART CATH AND CORONARY ANGIOGRAPHY;  Surgeon: Jordan, Peter M, MD;  Location: [facility] INVASIVE CV LAB;  Service: Cardiovascular;  Laterality: N/A;   TOOTH EXTRACTION     TRANSESOPHAGEAL ECHOCARDIOGRAM (CATH LAB) N/A 12/13/2023   Procedure: TRANSESOPHAGEAL ECHOCARDIOGRAM;  Surgeon: Loni Soyla LABOR, MD;  Location: [facility] INVASIVE CV LAB;  Service: Cardiovascular;  Laterality: N/A;   Family History  Problem Relation Age of Onset   Cancer Maternal Grandfather    Colon cancer Neg Hx    Colon polyps Neg Hx    Esophageal cancer Neg Hx    Rectal cancer Neg Hx    Stomach cancer Neg Hx    Social History   Occupational History   Not on file  Tobacco Use   Smoking status: Former    Current packs/day: 0.00    Average packs/day: 1.5 packs/day for 48.0 years (72.0 ttl pk-yrs)    Types: Cigarettes    Start date: 03/22/1975    Quit date: 2025    Years since quitting: 0.9    Passive exposure: Past   Smokeless tobacco: Never    Tobacco comments:    He reports he has quit 3 times and recently started back in 2012. Hsm     1/2 PPD  Vaping Use   Vaping status: Never Used  Substance and Sexual Activity   Alcohol use: Not Currently   Drug use: No   Sexual activity: Not on file   Tobacco Counseling Counseling given: Yes Tobacco comments: He reports he has quit 3 times and recently started back in 2012. Hsm  1/2 PPD  SDOH Screenings   Food Insecurity: No Food Insecurity (02/19/2024)  Housing: Low Risk  (02/19/2024)  Transportation Needs: No Transportation Needs (02/19/2024)  Utilities: Not At Risk (02/19/2024)  Alcohol Screen: Low Risk  (02/14/2023)  Depression (PHQ2-9): Medium Risk (02/19/2024)  Financial Resource Strain: Low Risk  (02/18/2024)  Physical Activity: Inactive (02/19/2024)  Social Connections: Socially Isolated (02/19/2024)  Stress: No Stress Concern Present (02/19/2024)  Tobacco Use: Medium Risk (02/19/2024)  Health Literacy: Adequate Health Literacy (02/19/2024)   See flowsheets for full screening details  Depression Screen PHQ 2 & 9 Depression Scale- Over the past 2 weeks, how often have you been bothered by any of the following problems? Little interest or pleasure in doing things: 1 Feeling down, depressed, or hopeless (PHQ Adolescent also includes...irritable): 1 PHQ-2 Total  Score: 2 Trouble falling or staying asleep, or sleeping too much: 3 Feeling tired or having little energy: 0 Poor appetite or overeating (PHQ Adolescent also includes...weight loss): 0 Feeling bad about yourself - or that you are a failure or have let yourself or your family down: 0 Trouble concentrating on things, such as reading the newspaper or watching television (PHQ Adolescent also includes...like school work): 1 Moving or speaking so slowly that other people could have noticed. Or the opposite - being so fidgety or restless that you have been moving around a lot more than usual: 0 Thoughts that you would be better  off dead, or of hurting yourself in some way: 0 PHQ-9 Total Score: 6 If you checked off any problems, how difficult have these problems made it for you to do your work, take care of things at home, or get along with other people?: Very difficult  Depression Treatment Depression Interventions/Treatment : Currently on Treatment     Goals Addressed               This Visit's Progress     Remain healthy (pt-stated)        Lose weight and get off some meds.       Visit info / Clinical Intake: Medicare Wellness Visit Type:: Subsequent Annual Wellness Visit Persons participating in visit:: patient Medicare Wellness Visit Mode:: In-person (required for WTM) Information given by:: patient Interpreter Needed?: No Pre-visit prep was completed: yes AWV questionnaire completed by patient prior to visit?: yes Date:: 02/18/24 Living arrangements:: (!) lives alone Patient's Overall Health Status Rating: (!) fair Typical amount of pain: none Does pain affect daily life?: no Are you currently prescribed opioids?: no  Dietary Habits and Nutritional Risks How many meals a day?: 3 Eats fruit and vegetables daily?: yes Most meals are obtained by: preparing own meals In the last 2 weeks, have you had any of the following?: none Diabetic:: no  Functional Status Activities of Daily Living (to include ambulation/medication): Independent Ambulation: Independent with device- listed below Home Assistive Devices/Equipment: CPAP; Eyeglasses Medication Administration: Independent Home Management: Independent Manage your own finances?: yes Primary transportation is: driving Concerns about vision?: no *vision screening is required for WTM* Concerns about hearing?: no  Fall Screening Falls in the past year?: 0 Number of falls in past year: 0 Was there an injury with Fall?: 0 Fall Risk Category Calculator: 0 Patient Fall Risk Level: Low Fall Risk  Fall Risk Patient at Risk for Falls Due  to: No Fall Risks  Home and Transportation Safety: All rugs have non-skid backing?: yes All stairs or steps have railings?: yes Grab bars in the bathtub or shower?: (!) no Have non-skid surface in bathtub or shower?: (!) no Good home lighting?: yes Regular seat belt use?: yes Hospital stays in the last year:: no  Cognitive Assessment Difficulty concentrating, remembering, or making decisions? : no Will 6CIT or Mini Cog be Completed: no 6CIT or Mini Cog Declined: patient alert, oriented, able to answer questions appropriately and recall recent events  Advance Directives (For Healthcare) Does Patient Have a Medical Advance Directive?: Yes Does patient want to make changes to medical advance directive?: No - Patient declined Type of Advance Directive: Healthcare Power of [place]; Living will Copy of Healthcare Power of Attorney in Chart?: No - copy requested Copy of Living Will in Chart?: No - copy requested  Reviewed/Updated  Reviewed/Updated: Reviewed All (Medical, Surgical, Family, Medications, Allergies, Care Teams, Patient Goals)  Objective:    Today's Vitals   02/19/24 1043  BP: 124/62  Pulse: 75  Temp: 98.2 F (36.8 C)  TempSrc: Oral  SpO2: 95%  Weight: (!) 306 lb (138.8 kg)  Height: 6' 3 (1.905 m)   Body mass index is 38.25 kg/m.  Current Medications (verified) Outpatient Encounter Medications as of 02/19/2024  Medication Sig   amLODipine  (NORVASC ) 5 MG tablet Take 1 tablet (5 mg total) by mouth daily.   atorvastatin  (LIPITOR) 80 MG tablet Take 1 tablet (80 mg total) by mouth daily. (Patient taking differently: Take 40 mg by mouth daily.)   Cholecalciferol (VITAMIN D3) 125 MCG (5000 UT) CAPS Take 5,000 Units by mouth daily in the afternoon.   lithium  carbonate 300 MG capsule Take 3 capsules (900 mg total) by mouth at bedtime.   NON FORMULARY Pt uses a c-pap nightly   PARoxetine  (PAXIL ) 40 MG tablet Take 1 tablet (40 mg total) by mouth daily.    QUEtiapine  (SEROQUEL  XR) 400 MG 24 hr tablet Take 1 tablet (400 mg total) by mouth at bedtime.   No facility-administered encounter medications on file as of 02/19/2024.   Hearing/Vision screen Hearing Screening - Comments:: Denies hearing difficulties   Vision Screening - Comments:: Wears rx glasses -Not up to date with routine eye exams. Deferred  Immunizations and Health Maintenance Health Maintenance  Topic Date Due   HIV Screening  Never done   Hepatitis B Vaccines 19-59 Average Risk (1 of 3 - 19+ 3-dose series) Never done   Zoster Vaccines- Shingrix (1 of 2) 03/05/1984   Colonoscopy  08/30/2022   COVID-19 Vaccine (8 - Pfizer risk 2025-26 season) 05/19/2024   Lung Cancer Screening  08/08/2024   Medicare Annual Wellness (AWV)  02/18/2025   DTaP/Tdap/Td (2 - Td or Tdap) 06/06/2029   Pneumococcal Vaccine: 50+ Years  Completed   Influenza Vaccine  Completed   Hepatitis C Screening  Completed   HPV VACCINES  Aged Out   Meningococcal B Vaccine  Aged Out        Assessment/Plan:  This is a routine wellness examination for Yehuda.  Patient Care Team: Merna Huxley, NP as PCP - General (Family Medicine) Shlomo Wilbert SAUNDERS, MD as PCP - Cardiology (Cardiology) Cottle, Lorene KANDICE Raddle., MD as Attending Physician (Psychiatry) Dann Candyce RAMAN, MD as Consulting Physician (Cardiology) Liane Sharyne KANDICE, [facility] (Inactive) as Pharmacist (Pharmacist) Duke, Jon Garre, PA as Physician Assistant (Cardiology)  I have personally reviewed and noted the following in the patient's chart:   Medical and social history Use of alcohol, tobacco or illicit drugs  Current medications and supplements including opioid prescriptions. Functional ability and status Nutritional status Physical activity Advanced directives List of other physicians Hospitalizations, surgeries, and ER visits in previous 12 months Vitals Screenings to include cognitive, depression, and falls Referrals and  appointments  Orders Placed This Encounter  Procedures   Ambulatory referral to Gastroenterology    Referral Priority:   Routine    Referral Type:   Consultation    Referral Reason:   Specialty Services Required    Number of Visits Requested:   1   In addition, I have reviewed and discussed with patient certain preventive protocols, quality metrics, and best practice recommendations. A written personalized care plan for preventive services as well as general preventive health recommendations were provided to patient.   Rojelio LELON Blush, LPN   [id number]   Return in 1 year on 02/24/25  After Visit Summary: (In Person-Declined) Patient declined AVS  at this time.  Nurse Notes: None

## 2024-02-20 ENCOUNTER — Encounter: Payer: Self-pay | Admitting: Psychiatry

## 2024-02-21 ENCOUNTER — Telehealth: Payer: Self-pay | Admitting: Psychiatry

## 2024-02-21 NOTE — Telephone Encounter (Signed)
 Patient lvm at 11:42 in regards to his Paxil  medication. States that he increased dosage last week and had to stop due to feeling confused and irritability and still is experiencing that. He has since decreased back to normal dosage and wondered if the confusion and irritability would stop with time. He also inquired about trying Caplyta and if he could get samples of that. PH: [phone] Appt 1/7

## 2024-02-22 ENCOUNTER — Other Ambulatory Visit: Payer: Self-pay | Admitting: Psychiatry

## 2024-02-22 DIAGNOSIS — F3181 Bipolar II disorder: Secondary | ICD-10-CM

## 2024-02-22 NOTE — Telephone Encounter (Signed)
 Pt seen 11/26. Please see his message regarding SE. He reports he decreased dose back down on Saturday and is still reporting confusion. He asked again about Caplyta. I told him we could provide samples for him to try if you were agreeable to trying it. He said he could pay $175/mo, said he wanted to get off Seroquel  and if this would help he wanted to try.

## 2024-02-23 NOTE — Telephone Encounter (Signed)
 Recommendations reviewed with patient. He said he is 85% better this Friday than he was last Friday.

## 2024-02-23 NOTE — Telephone Encounter (Signed)
 It is peculiar that he felt increasing paroxetine  caused confusion.  That is not typical .  Also if he dropped the dose from 40 to 30 mg and it is not better that should not happen.  Makes me suspicious something else might be going on like mania.  Is he having manic sx?    I can't switch him from Seroquel  to Caplyta outside of an appt.  This is too complex to do without appt.

## 2024-03-05 ENCOUNTER — Ambulatory Visit

## 2024-03-06 ENCOUNTER — Encounter: Payer: Self-pay | Admitting: Psychiatry

## 2024-03-06 ENCOUNTER — Telehealth: Admitting: Physician Assistant

## 2024-03-06 ENCOUNTER — Ambulatory Visit: Admitting: Psychiatry

## 2024-03-06 DIAGNOSIS — R7989 Other specified abnormal findings of blood chemistry: Secondary | ICD-10-CM | POA: Diagnosis not present

## 2024-03-06 DIAGNOSIS — Z79899 Other long term (current) drug therapy: Secondary | ICD-10-CM

## 2024-03-06 DIAGNOSIS — Z716 Tobacco abuse counseling: Secondary | ICD-10-CM

## 2024-03-06 DIAGNOSIS — G4733 Obstructive sleep apnea (adult) (pediatric): Secondary | ICD-10-CM | POA: Diagnosis not present

## 2024-03-06 DIAGNOSIS — E538 Deficiency of other specified B group vitamins: Secondary | ICD-10-CM | POA: Diagnosis not present

## 2024-03-06 DIAGNOSIS — F17211 Nicotine dependence, cigarettes, in remission: Secondary | ICD-10-CM

## 2024-03-06 DIAGNOSIS — F3181 Bipolar II disorder: Secondary | ICD-10-CM

## 2024-03-06 DIAGNOSIS — F411 Generalized anxiety disorder: Secondary | ICD-10-CM

## 2024-03-06 DIAGNOSIS — F422 Mixed obsessional thoughts and acts: Secondary | ICD-10-CM

## 2024-03-06 MED ORDER — PAROXETINE HCL 30 MG PO TABS: 30.0000 mg | ORAL_TABLET | Freq: Every day | ORAL | 0 refills | Status: AC

## 2024-03-06 MED ORDER — QUETIAPINE FUMARATE ER 300 MG PO TB24: 300.0000 mg | ORAL_TABLET | Freq: Every day | ORAL | 0 refills | Status: DC

## 2024-03-06 NOTE — Patient Instructions (Signed)
 Start Caplyta 1 nightly. Reduce Seroquel  XR 300 mg nightly for 2 weeks, Then cut it 1/2 of 300 mg nightly for 2 weeks and then stop it.

## 2024-03-06 NOTE — Progress Notes (Signed)
 Jonathon Walker [id number] Jun 29, 1965 58 y.o.  Subjective:   Patient ID:  Jonathon Walker is a 58 y.o. (DOB September 26, 1965) male.  Chief Complaint:  Chief Complaint  Patient presents with   Follow-up   Depression   Anxiety    Jonathon Walker presents to the office today for follow-up of anxiety , depression, alcohol.    seen August 7.  His anxiety was unmanaged at Paxil  40 mg and olanzapine  5 mg so olanzapine  was increased to 7.5 daily.  seen December 17, 2018.  His anxiety was unmanaged.  The following change was made: Trial 1-1/2 of the 7.5 mg tablets of olanzapine  for anxi.  He is had a stay at [facility] for alcohol dependence since he was last here. Stayed 28 days and DC before Thanksgiving.  Very helpful.  Was having NM nighly before and they stopped since then.  Not drinking.  Involved in AA since then.  seen March 25, 2019.  The patient was doing better requested a reduction in olanzapine  to 7.5 mg nightly to improve energy.  visit April 03, 2019.  He had remained sober and his anxiety was improved with sobriety.  He was having problems with low motivation and forgetfulness.  At the next refill it was decided olanzapine  would be reduced from 7.5 to 5 mg daily. He called requesting this urgent appointment because of worsening symptoms associated with returning to work.  seen May 21, 2019.  He was doing relatively well.  The following was noted: Tolerating Wellbutrin  and it seems to help. He didn't tolerate the increase. Anxiety is improved off alcohol.  Ok to reduce olanzapine  to 2.5 mg daily for a month and stop it.  Hopefully low motivation will improve.   He called back March 23 stating he was more depressed and having a harder time doing routine tasks and having suicidal thoughts.  Because of worsening depression after reducing the olanzapine  he was encouraged to increase olanzapine  back to 5 mg nightly. He called again March 24 stating he was really struggling but  was scheduled for an appointment on March 26.  As of June 14, 2019 he reports the following: Every day a real hard struggle to get through the days.  More anxious and depressed equally. Worse 2 weeks.  Last Saturday so overwhelmed by how he feels he had SI.  Everything is hard. Initial benefit paroxetine  has been lost.  Awakens stressed and tired. Adequate hours of sleep.  Will be major chore just to mow grass.  Still sober. SE sexual. Getting appt with CPAP doc. Changes made include: Increasing olanzapine  back to 5 mg daily a couple of days prior to the appointment due to phone call and the addition of lithium  300 mg for a few days then increase to 600 mg nightly both to augment the antidepressant and because of suicidal thoughts.  July 15, 2019 appointment, the following is noted: Doing OK with both depression and anxiety.  Kind of like a top always going in mind.  Even when I sleep, when awakens mind still going.  NM nightly for years but less than before stopped drinking.  Smaller and less severe ones that don't wake him.  Theme can't finish things.  Both depression and anxiety 3/10.  More productive.  Paces himself.  Can live with it.  Sleep 9 hours.  Initially olanzapine  stopped the ruminating but it's back some.  Overall still benefit.  No SI and can't rmember why he had them before. Residual  energy not great and some ruminating. He had some concerns about sexual side effects from Paxil  but agreed that no med changes were necessary despite residual symptoms of depression and anxiety.  08/14/2019 appointment, the following is noted:  He scheduled urgently. Vaccinated. Angry easily and exhausted and everything is a chore.  Exhausted. Never had appt with CPAP company.  CPAP is 59 years old and on same settings as in the past.  CPAP machine is not working properly but when it did it was helpful for alertness and energy.  Disc need to call them to have it evaluated. To bed 10-7.  Don't feel rested  in morning but used to feel rested when first started paroxetine . Plan: Increase Lithium  continue to 900 mg daily for irritability  11/19/19 appt with the following noted: He increased to 900 mg and then reduced it but doesn't know why.  Reduced it to 300 nightly. Irritability much better.  Once anger since here with rage but not outward. Still on paroxetine  40, olanzapine  5, Wellbutrin  75 AM. Getting tired in afternoon and worrying about getting depressed but he's not depressed now.  Don't enjoy things like he used to.  Les interest and excitement. Appetite and sleep are normal.   Still adjusting without alcohol and socialization. Worrying about lack of sex drive with paroxetine . Can still ruminate on things until he gets some reassurance through talking with people. Plan: Continue low-dose lithium  300 mg daily Continue Paxil .  Did not feel well at 60 mg so we will continue 40 mg.  Likely to lose anxiety benefit if change it. Continue low-dose Wellbutrin  75  Tolerating Wellbutrin  and it seems to help. He didn't tolerate the increase.   Continue olanzapine  5 mg  02/18/2020 appointment with following noted: Open to trying increase paroxetine  again bc ruminating wears him out and gets fatigued and it wears him out though is 80% better vs before paroxetine . Doesn't remember trying higher paroxetine .  Ruminates on relationships or work.  Whenever he can talk about it with someone it helps tremendously but also realizes paroxetine  helps. Anxiety affects dreams and NM too.  Up and down a little.  Anxiety drove depression before paroxetine .  Best med he's tried. SOBER 1 YEAR SE no problem.  Except gained 25# in 4 years.  No change in diet and exercise.  No increase in appetite and no food cravings.  Seeing PCP soon. Plan: Increase paroxetine  trial to 60 for rumination.  Disc SE.  05/26/20 appt noted: No benefit increase paroxetine  nor SE.  Wants to reduce back to 30 mg paroxetine  and stop lithium  bc  it didn't seem to help.   4 days of Calm app has seemed to help rumination.  Does it in afternoon and before bed and it seemed to helps.  Doing a 10 min meditation and it helps.   Dep 4/10.  Anxiety 4/10. And a lot better the last few days.   Plan: Reduce lithium  by 1 tablet per week.  Call if there is any suicidal thought. Starting April 1 reduce paroxetine  to 40 mg daily. Continue low-dose Wellbutrin  75  Tolerating Wellbutrin  and it seems to help. He didn't tolerate the increase.   If doing OK will try taper as he suggested at next visit. Continue olanzapine  5 mg nightly it was helpful for depression and anxiety.  07/01/2020 appt with following noted: Moved appt up. Now says he understated depression level when he was here the last time. Restarted lithium  bc felt more depressed without  it. Reduced paroxetine  to 40 mg daily. Lethargic.  Mind won't calm down.  Nonrestorative sleep but 8 hours.  Doesn't think sleep is deep enough.  Tense. Goes to [employer]. Started counseling. Wants to try something different with meds. Can have mixed sx for 6 weeks with increased interest in things and motivation and want to spend money but still feels depressed.  3 times in the last year. Plan: Increase  lithium  back to 3 daily to see if depression is better and less mood cycling.  He admits to feeling worse when he stopped the lithium  Continue the reduce paroxetine  to 45 mg daily. DC Wellbutrin  Lamotrigine  trial 25 mg to 100 mg daily over 4 weeks. Continue olanzapine  5 mg nightly it was helpful for depression and anxiety.  08/26/2020 appointment with the following noted: Sad and down.  Classic depression sx and very fatigued.  Unusual to have classic depression bc usually is atypical.  No effect from lamotrigine . Wonders if atorvastatin  is causing some confusion or depression bc read about it. Depressed for 3 mos. Gained 20#.  It's leveled off now. Olanzapine  helped the rumination and anxiety.    Life is more depressing bc not seeing friends like it was bc stopped drinking.   No SE with lithium .  Unless dropping things. Sleep 1030 to 6 but doesn't sleep deep.  Not rested in AM Plan: Increase  lithium  back to 3 daily to see if depression is better and less mood cycling.  He admits to feeling worse when he stopped the lithium  Continue the reduce paroxetine  to 40 mg daily bc it helped anxiety. Lamotrigine  increase to 150 mg daily DT no benefit or SE Increase olanzapine10 mg nightly it was helpful for depression and anxiety.  Need more help with depression   09/11/2020 phone call: Pt stated he is foegetting things throughout the day and very unsteady.He works with tools and equptment and this is a problem.He stated he takes 6 Lamictal  in the am. MD response: We made 3 med changes at the last visit including increasing lamotrigine  to 150 mg daily, increasing olanzapine  to 10 mg daily, and restarting lithium .  Any 1 of those changes could possibly cause some of the side effects.  We will have to make 1 change at a time to evaluate this.  Therefore reduce lamotrigine  to 100 mg daily.  It may take a couple of weeks before he sees a difference.  11/13/2020 phone call: Patient lm stating he is currently taking Lamotrigine  150 mg. He has decreased it to 75 mg due to side effects per pt ( blurred vision, shaking and coordination issues). Patient stated he is discontinuing before his scheduled October appointment. MD response: Take the lamotrigine  75 mg for 2 weeks and he should be able to stop then without withdrawal.  If he gets shakey, nervous then we need to reduce more slowly and let us  know that.  Otherwise he can stop if he follows these directions.  11/30/2020 phone call: Pt called and advised he would like to taper off of the Olanzapine .  He has gained 8# since his last visit and 30# total since he started taking the med.  He feels steady, not so much stress now, and would like to see if he can take  Lorazepam instead.  Then talk to Dr. Geoffry in Oct when they meet. MD response: Reduce olanzapine  to one half of the 10 mg tablet at night for 4 weeks and then stop it.  If he gets  more depressed then get back in touch with us  and we will try to do something about the weight gain that olanzapine  can cause by using the alternative Lybalvi  12/07/2020 appointment with the following noted: Off lamotrigine  .  Reduced olanzapine  to 5 mg for a week.  On lithium  900 mg HS, paroxetine  40 Fatigue 10/10.  Dizziness resolved off lamotrigine .  Low fatigue and motivation.  I want to want to do things.  Stopped drinking almost 18 mos and socially big adjustment.   Still invloved with AA and daily sponsor.  Working [employer].  Haroldine Young helped. Thinks mild depression with little interest and motivation and no joy.  But not severe.  No SI. No anxiety and it's way better.  Life is more stable and helps.  Working 4 hours daily and occ extrea jobs. Uses new CPAP.  Stil drowsy and fatigue. Plan: Continue CPAP use. New CPAP machine. Trial modafinil  100-200 mg AM Lamotrigine  off DT NR Reduced olanzapine  to 5 mg daily for a week and well stop in 3 weeks DT wt gain and fatigue  01/20/2021 phone call: I called patient to see if I could get better information. He states he started modafinil  a couple of weeks ago, though Rx was written for in September. He states he didn't feel well before taking the modafinil  though. He just says he is agitated, irritable, and unstable feeling. When asked what he meant by unstable he stated feeling a shell of myself.  He has been put on the cancellation list.  MD response: It is likely the modafinil  that is causing this problem.  But it may be corrected with a dose reduction.  I assume he is taking a whole tablet.  If that is the case time to reduce to 1/2 tablet in the morning.  If he is taking half a tablet tell him to reduce it to a quarter of a tablet.  And I agree we need to try to get  him in as soon as possible. He reduced modafinil  from 200 mg to 100 mg daily.  02/10/2021 appointment with the following noted: Current psych meds lithium  900 mg nightly, paroxetine  40 mg daily, olanzapine  recently stopped, modafinil  recently tried. Was really bad for awhile feeling depresssed and agitated and anxiety but better now including after reducing modafinil  to 100 mg daily. Fatigue is better and in the middle now with energy, not good but not bad. Sleep is better.  CPAP doctor yesterday and they are gathering info pending. NAC is helping with memory. Still sober and committed to it. SE constipation  Plan: No med changes  03/10/2021 phone call from patient complaining of vague feelings of not feeling well and wanting the Paxil  changed.  He was placed on the cancellation list.  03/18/2021 appointment urgently scheduled at his request: Remains on paroxetine  40 mg daily, lithium  1200 mg daily, modafinil  200 mg every morning. Ruminating  with sense of need to talk to someone.  Then feels better for awhile.  Mostly on relationships with family.  No one to talk to. Wonders need to chang ethe meds.   Don't socialize anymore since sober.  Does online AA everyday at noon. Lost 10# off olanzapine . Plan: Continue paroxetine  40 mg, lithium  1200 mg daily, Try stopping modafinil  100mg  AM to determine if it is helping or not. If not less anxious then start risperidone  for rumination 1-2 mg HS  04/05/2021 appointment with the following noted: Stopped  modafinil  and it helped reduce anxiety. Started  risperidone  2 instead of 1 mg and was off balance so reduced risperidone  to 1 mg HS. Only done this for 3 nights.  No SE with it. Anxiety is not close to as bad as originally.  Can be painful but not now. Still ruminating some my whole adult life but varies.  Anxiety can lead to depression. 6-7/10 with 10 good on depression with some problems with ambition. Plan: Continue trial risperidone  for  rumination 1 mg HS  04/07/2021 TC: Several phone calls: Called 2 days after the visit complaining of itching from the lithium  and wanting to come off of it.  He was warned he was more emotionally distressed when he reduced the lithium  in the past but he wanted to pursue it anyway.  He was continuing risperidone  1 mg nightly but wanting to reduce that also but was warned he would almost certainly have worsening mood severity and anxiety problems based on past experience.  He was cautioned against reducing lithium  below 900 mg daily and risperidone  below 1 mg nightly.  He agreed on 04/09/2021.  04/09/2021 he called again and several phone calls ensued thereafter.  He insisted on stopping lithium  and risperidone  despite warnings that his mood stability and anxiety would worsen. He was prescribed Depakote  to take the place  04/20/2021 appointment with the following noted: He stopped risperidone  and did not take the Depakote .  He is taking lithium  900 mg nightly and paroxetine  40 mg daily. Stopped risperidone  bc feeling agitated and unstable and blamed it.  Still feels the same off of it. Says itching 85% better with less lithium .  Itching for a couple of mos.  Doesn't think anxiety noticeably worse after reducing lithium  to 900 mg daily. Not dizzy.  Clumsy with hands. Anxious but not painful like it was before Paxil  but tense at the end of the day.  Initially felt normal for the first time in 20 years when first started paxil  but not as good now. Not sadness and hasn't had much of it. Started therapy with doctor on demand. Ruminating on fear of not being fully present and worries about little things like appts pending.   Afraid of working FT because gets overwhelmed bc I take it home with me Plan : Continue trial risperidone  but increase to rumination 1.5 mg HS Continue paroxetine  40 and lithium  600 mg   06/01/2021 appointment with the following noted: Haven't seen benefit with risperidone  1.5 mg  daily. Not in pain but feels disconnected and not myself.  Has felt like this off and on for years. Level of angst all day but not real high unless triggered.  Still has the rumination. Initially a lot of benefit from paroxetine  Not markedly deprssed and not sad.  07/14/21 appt noted:  Doesn't feel much different with switch to fluvoxamine  except anxiety well controlled. But still low energy, motivation and not as productive as he wants to be.  Not markedly sad.  Asks about Vraylar . Not much interest or enjoyment. Don't sleep that great and don't relax that great. On fluvoxamine  200, lithium  900, off risperidone . Plan: Anxiety controlled by fluvoxamine  200 Potentiate Vraylar  1.5 mg daily  07/27/2021 phone call complaining of ongoing depression.  Vraylar  increased to 3 mg daily. 07/29/2021 phone call complaining of cost of Vraylar .  Was given samples. 08/20/2021 phone call complaining of fatigue.  Stated he stopped the Vraylar  because it made him agitated.  It was recommended there be no further med changes because he is only been off the Vraylar   for 1 week.  08/30/2021 appointment noted: Vraylar  1.5 mg didn't do much.  Increased to 3 mg and then felt irritable ans stopped it about 3rd week of May.  Better now. Still extremely tired and feels physically sick with normal workup. Thinks his mind wears him out.  Racing negative thoughts all the time. Asks about Ativan. Always nervous and anxious.  Trouble discerning anxiety from depression. Plan: Anxiety was controlled by fluvoxamine  200 but now he thinks it is worse. So increase 250 mg daily  10/18/21 appt noted: No results with increase fluvoxamine .  No SE. Anxiety is still worse than depression.  Thinks it causes somatic sx like the flu.  Exhausted. GI. Poor sleep and concentration. Asks about lorazepam.   Got checked out by PCP without findings. Plan: Reduce lithium  to 2 capsules daily to see if physical symptoms like fatigue etc. are  better Increase fluvoxamine  to 1 tablet in the morning and 2 tablets in the evening to reduce anxiety Plan Trial reduction of  lithium  900  to 600 mg daily to see if somatic sx are better like fatigue.   01/06/22 appt noted Everything is about the same with med changes. CC anxiety and fatigue.  Sx started with severe depression in 20s and got better but never resolved. Thinks anxiety is causing fatigue.  Has to nap about 1 pm.  Easily fatigued.  No particular worry other than how he feels and himself.. Plan: Reduce fluvoxamine  gradually to 150 mg daily Start clomipramine  25 mg nightly for 5 days and increase to 75 mg HS  02/07/22 TC:  Pt lvm that the clomiprame 25 mg has been taking the full dose for two weeks now. He is experiencing mania. Overspending etc    MD resp:  Increase the lithium  back to 3 daily.  Stop the clomipramine .  Resume the risperidone  until these symptoms clear up.  Call us  back next week and let us  know how it is going.     02/16/22 TC:   Pt was just calling to give you an update.He stated he is still 50 percent manic but feels better.He is currently taking   lithium  carbonate 300 MG 3 daily risperiDONE  3 mg 1/2 tab daily fluvoxamine  100 MG tablet 1/2 tablet in the AM and 1 tablet at night On 11/20 changes were made,you had him stop clomipramine                      and increase lithium  to 900 mg daily.  02/16/22 MD: check lithium  level.  02/24/22 tC complaining of depression. MD :  He has had recent mood swings from manic to depressed by his report.  Lithium  level is low at 0.5 on 600 mg daily.  I think the most helpful thing for his mood would be to increase lithium  back to 900 mg daily.      03/07/22 TC: pt reported increasing his Luvox  to 200 mg daily.  03/08/22 appt noted: Multiple phone calls since here.   Clomipramine  triggered mania like he's never had before.  Increased over the top looking for a house, wanting to invent things, bought a shed and fish  tank abruptly.  Left his job which was a good thing.  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  I feel a lot better about it.  Has disability and will do PT work.  I'll be OK. Exhausted and can't be on his feet all the time.   Sleep is  different with EMA and ready to go but watches TV awhile and then goes back to sleep.  Always a late sleeper all his life.   Still feeling some depression and he increased the luvox  to 200 and it helped. Thinks parents are Autistic and don't respond emotionally.  M cannot understand feelings and he doesn't get any support.   Plan: Bc recent mania with clomipramine  increase lithium  1200  mg daily .    03/29/2022 appointment urgently due to recent mania: Psych meds: fluvoxamine  200, lithium  1200, risperidone  1.5 mg HS Exhausted until the last couple of days. SE tremor Has not had any water  today and is lightheaded.   Mood a little better and anxiety better than it was.  No mania. Some underlying weight in mood that only responded to paroxetine  and clomipramine . Asks about Wellbutrin  for energy. Sleep normal 8-9 hours now.  Except last night. Goes to [facility] groups and support group and AA. Sober Reduce risperidone  to 1 mg to see if energy better  05/25/22 appt noted: Reduced lithium  about 10 days ago bc thought he might be getting too much bc read on the internet.  No changes since reducing it. More dep for a couple of mos which is unusual.  In bed a lot and more isolated and not normally what I have.  Not real sad but unmotivated and no energy to do things.    Start therapist Friday. No sig sadness.  Irritable and short with people over little things.  Kind of started after stopped drinking.  Worse.  Sleep on and off.  Erratic.  Looks for ward to night so can escape into sleep.   Anxious underlying for 30 years.  Hard to sit and relax and just watch TV.  Tends to ruminate. Asks to try Gaba. Now lethargy is worse than chronic anxiety Reducing risperidone  made no  changes good or bad. DC risperidone  1 mg HS Add Latuda  1/2 tablet for 1 week then 1 tablet in the the evening  07/06/22 appt :  Increased Latuda  up to 100 mg .  No clear SE. Exhausted mentally and it makes his body tired.  Working 10-2 and then comes home and lays in bed a couple of hours.  Not a deep sleep.  Never feels fully rested.  Sleep HS 10-7.   Normal appetitie.   Still some sadness and irritability.  Anxiety doesn't seem better but is having less mood swings and mania.  Anxiety wears him down.   Plan: Increase  Latuda  120 mg in the evening with food  07/21/22 appt noted: Trouble keeping mood chart.  If I talk and I'm heard then feels better for hours but then damn stopped up.  Almost like an obsessive thing to talk for relief. No clear effect of increase Latuda  except reduced som cognitive anxiety but not physical anxiety. Says there was med he took before paxil  that gave him instant relief and wonders about retrying it.  Doesn't know which med. Paxil  had best effect when it worked for a period of time. Exhausted but fidget when sits.   Dep and anxious moderate.  09/01/22 appt noted: Meds clonazepam  0.5 mg BID prn, fluvoxamine  200 mg daily, lithium  900 HS, latuda  120 mg daily, vit D3 This type of dep since November after mania is low energy, anhedonia, low appetite, a lot of time in bed, textbook depresssion, lack of interest but not debilitating sadness.   Tired of it.  Dep is worse than anxiety and used  to be the other way around.  Not as irritable. In past dep was anxious and disconnected and wasn't as tired.  Had to leave work bc can't get himself out of bed.   Toss and turn in bed fidgety in bed, feels like has to move but no energy to walk.  Fidgety for 3 mos. Clonazepam  takes the edge off but feels a higher dose is needed.  Plan: Changes: Reduce Latuda  to 60 mg daily. Start samples of Auvelity  1 in the AM for dep that is TRD  10/20/22 appt noted: Meds clonazepam  0.5 mg BID  prn, fluvoxamine  200 mg daily, lithium  900 HS, latuda  60 mg daily, vit D3, out of Auvelity  after 2 weeks. Less restless but still has it 6/10 after reduced Latuda .  Thinks he had it before eLatuda but not sure. Takes an hour to get to sleep.   No change in dep from this visit to last visit. Did have positive effects from Auvelity  but ? SE conc.  Did help energy Lately more dep than anxiety.  Noted ASA helps anxiety. Couple times situational anger but no mood swings. Plan: Changes: Reduce fluvoxamine  to 1 and 1/2 tablet daily to see if restlessness better Reduce Latuda  from 60 to 40 mg daily to see if restlessness better Retry Auvelity  1 in the AM for 7 days then 1 twice daily  11/14/22 appt noted: Twitching and restlessness better.   Still some teeth grinding but better Auvelity  didn't help mood.  Ongoing classic lethargy, poor appetite.  Can't exercise bc depression.  Little tasks overwhelming.    clonazepam  0.5 mg BID prn, fluvoxamine  150 mg daily, lithium  900 HS, latuda  40 mg daily, vit D3,  Auvelity  BID SE constipation and bruxism. Sleep fair and best and most relaxed 730-930 AM.  To bed 10 pm.   12/26/22 appt noted: More relaxed if not underpressure but not working and needs to. Not much motivation or energy in AM.  Doesn't notice the effects of Seroquel  for many hours, but it is relaxing med. For last 7 years hard time with motivation and capacity.   Happened years ago and then it resolved after 7 years with Wellbutrin  Plan: Changes none:  Continue 1/2 quetiapine  XR 150 mg PM.  This is helping him to feel more calm in the morning than he has felt in a very long time.  However he thinks now he is a little too subdued but does not want to do reduce the dose any further.  Does not tolerate a higher dose because it is too sedating.  01/09/23 TC:  Patient taking 75 mg of Seroquel  and sleeping great at night, but also sleeping a lot during the day. He reports he doesn't want to be around  people, is content to be at home not doing anything. He rates depression as 7/10, anxiety 5/10. Reports anxiety is better since starting Seroquel . He also takes: Fluvoxamine  150 mg at bedtime Lithium  900 mg at bedtime, though 1200 is prescribed. No longer taking clonazepam .    MD resp:  There are a limited number of options left to us  and it is important that each med be tried at his most effective dose.  Seroquel  is FDA approved for depression in doses of 150 mg and higher.  The low dose he is currently on is enough to help anxiety and his sleep but probably not enough to help depression.  We may need to go as high as 200 or 300 mg a day.  He  is probably fearful of being too sedated at higher doses but this medicine does not necessarily make people more sleepy at higher doses.  I think she got to go up to the full quetiapine  XR 150 mg daily and give that a try until his appointment with me.      02/08/23 appt noted: Psych med: no Clonazepam  0.5 mg twice daily as needed anxiety, fluvoxamine  150 nightly, lithium  900 nightly, quetiapine  XR 150 at 3 pm Less am drowsiness taking Seroquel  in the afternoon. Chronic fatigue unchanged.  Rare days without it.  Can do one task daily.  Can work 1 and 1/2 hours daily.  Naps about 2 pm.   Seroquel  helps sleep and anxiety.   Easily overwhelmed if multiple tasks.   Feels much better in the AM than in years.  By noon is worn out.   Plan: Increase for TRD quetiapine  XR 200 mg PM.   03/23/23 appt noted: Meds as above except incr Seroquel  XR 200 mg daily. Anxiety better but still some dep with less motivation maybe 20% better. No sig SE except dryness. URI for 8 days. Sleep 10 hours per usual.   Much more relaxed with Seroquel . Plan: Increase further for TRD quetiapine  XR 300 mg PM.   05/02/23 appt noted: Multiple phone calls since here complaining of ongoing sx and med rxns. Incr and decrease fluvoxamine  to 150 now 100 complaining of no benefit for anxiety  at the higher dose and feeling somewhat agitated.   Current meds:.  Fluvoxamine  100 nightly, lithium  900 nightly, quetiapine  ER 300 mg nightly. Felt annoyed, angry , agitated without reason and called.   Is a little better the last 10 days.  Not nearly as angry SE wt gain and constipation. Asks about switch to paxil  bc helped the most of anything.   Speaking to counselor.  Fearful childhood. Plan: Increase quetiapine  ER  to  200 mg 2 tablets in evening for 3 weeks then 3 tablets in the evening.   06/15/23 appt noted: Med: Seroquel  XR 600, lithium  900, fluvoxamine  150. Vit D 5000 helped Overall is a little better.  Less foggy.  Less dep. 30-40% better with combo. SE gained another 5# Vit D helped fatigue.  Still naps but less. Sleep is OK. Less angry but still has shortness.  But not as bad. Plan continue above longer  07/27/23 appt noted: Med as above 50-60% of himself.  Mind wears him out and then has to take a nap.  It's been a lot worse than this before but would like change meds to get better back to paroxetine .   No changes with increase Seroquel . Willing to come back to this if sx are not better with paroxetine .   Physical weakness is rough.  But doesn't work so doesn't have to do much .  Last year could work 4 hours daily but not now.  Get overwhelmed really quickly with little matters.  Gets irritable. Plan: Plan: Reduce fluvoxamine  to 1/2 tablet and add 1/2 paroxetine  for 5 days, Then stop fluvoxamine  and increase paroxetine  to 1 tablet daily.    08/16/23 TC:  Pt reported he was having bad leg cramps from his hips to his feet. He said he googled and read that taking Seroquel  and Paxil  could give him too much dopamine. He decreased Seroquel  from 600 mg to 300 mg and reports after 2 days the cramps went away, he is more relaxed, less agitated, and is sleeping well. He is taking: Paxil  30 Lithium   900 Seroquel  300  He doesn't need/isn't asking for anything, just wanted to make you  aware.        6+/18/25 appt noted:  Med:  Seroquel  XR 300 pm, paroxetine  30, no fluvoxamine  , lithium  900 HS.  Helped leg cramps with less Seroquel  XR and better mental clarity.   Paxil  helped anxiety tremendously.   Still dealing with depression.  Exhausted by 1 pm and naps.   Sleep regular hours but not restful.  Moderate helpful.  9 hours.  CPAP. SE no sig other than constipation and hard to lose wt. But not gaining. Not enough fluids and gets lightheaded.   Plan: Plan: Reduce Seroquel  XR to 200  09/15/23 TC:   10/18/23 appt noted:  Med:  Pt lvm 6/26 @ 8:00 pm. Pt stated he decreased to 200 mg Seroquel  last week and 6/25 had mild mania. Gone now. Should he stay with 200 mg or increase back to 300 mg.    resp:  Patient reporting he did have one day of mania on reduced dose of Seroquel  and he went back up to 300 mg and he is doing better.     09/21/23 TC:  Pt had called 6/27 and left message that he had reduced Seroquel  to 200 mg. He reported an episode of mania and went back to 300 and was doing better.  Today he is reporting he feels like he is going to lose his marbles. I asked for examples and he said he didn't feel stable. He said he had a lot of 200 mg tablets and asked if he could go back to 400 mg, or if you had another suggestion. He said he had to decrease dose because his legs locked up. When asked he has no active SI, but said he wish he wasn't here. He did say that he was going to be really busy until 1:00 today and to leave him a message if I called and he didn't answer.     MD resp:  Take 300-400 mg HS of Seroquel  nightly.  Stay hydrated and take 1 magnesium tablet daily to prevent cramps.  We can discuss other options at his next appt if needed    Lorene Macintosh, MD, [facility]  10/18/23 appt noted:  Med:  Seroquel  XR 400 pm, paroxetine  30,  lithium  900 HS.  Better with 400 mg vs lower, resolved hypomania with buying.  Not wasteful buying.   2 brothers on Seroquel .  One of  them on disability with OCD but passed away. Paxil  very helpful.  More at peace. Get some tiredness in afternoon and has to nap 90 mins.  Done this for years. Normal sleep at night, but maybe not as refreshed, like something is on my mind for the last 6 mos.  Feels like he is half awake at night.  It is bearable.  But asks about Ambien. Mood and anxiety better than a long time.   Plan: continue Seroquel  XR 400, paroxetine  30, lithium  900.  No changes today But check lithium  level.  12/06/23 appt Med:  Seroquel  XR 400 pm, paroxetine  30,  lithium  900 HS.  Not going that good.  Mind running and tired and some mild mania.  Reduced conc, some dep.   Exhausted and has to nap in afternoon after 8 hours at night.   Anxiety is pretty high with rumination.  Helps to talk to people.   Knows we tried a variety of other meds.  Asks maybe to retry Vraylar .  Reviewed history Dwells on fear of how he feels.  If talks to someone about it it is relieved but then recurs after 3 days. Wonders if it is OCD.   Plan: retry Vraylar  1.5 mg capsule daily for 1 week then 3 mg daily for 3 weeks, then if no benefit then increase to 4.5 mg  (or 3 mg +1.5 mg ).  If worsening mania then increase faster.  12/26/23 TC:  Pt left vm saying he is having some intolerable leg jerking and pain.   He reported he had noticed toe tapping previously, but felt he could live with it. Reported the jerking was bad last night, he was unable to relax and had trouble sleeping. Reporting the jerking also in his arms, but worse in his legs. He has had these complaints previously and is relating them to Vraylar .  MD resp:  Stop Vraylar  and wait until SE resolve.  Then resume lower dose of 3 mg daily until appt.    Lorene Macintosh, MD, DFAPA       02/14/24 appt noted:  Med:  Seroquel  XR 400 pm, paroxetine  30,  lithium  900 HS. , Vraylar  stopped but partly DT cost Reports compulsive confession issues.  Will feel a lot better briefly with  confession and has for the last month.  He remembers this back teen years.  His B had severe OCD.  Concerned over wt gain but usually it has stopped.  Dx MVR 30% but will monitor. No longer manic sx Overall sx mod dep sx and anxiety better .  But knows OCD will flare up again.   Plan:  incrase paroxetine  40 for OCD,  12/03/28/23 appt noted: Med: Seroquel  XR 400 pm, paroxetine  30,  lithium  900 HS. ,  Felt mor e confused after increasing paroxetine  to 40 mg and then reduced to 30 mg and took a week to get better.    Wants off Seroquel  though it hleps DT wt gain.   Still fatigued.  Still some persistent sx.  But not obsesing as much right now.  That was why I drank so much but is better right now. Paxil  helped it but concerned it might come back again. Started gaining wt with Paxil  before eSeroquel.   Asks Latuda , risperidone , olanzapine , Geodon.   Sleep 9-10 hours now.    On disability for 7-8 years.  Working PT Before treatment did not sleep and RX Ambien and Wellbutrin  [facility] for alcohol.  Sober 2 year 01/2019  Past psych meds:   Paxil  60 too much + wt gain,   duloxetine, Zoloft,  Viibryd 60 partial resp (trial after paroxetine ) SE diarrhea. Trintellix 10 agitated Wellbutrin  75 (max tolerated),   Fluvoxamine  300 no better than 200 which seems to take edge off anxiety Clomipramine  mania Auvelity  4 weeks NR  Abilify, Rexulti 2. Vraylar  irritable at 3 mg daily. Retry 4.5  mg akathisia Latuda  120 fidgety Olanzapine  10 fatigue Risperdal  2 once Seroquel  XR 600 benefit partial; 200 daily lost response  Buspar 30 BID stopped DT partial effect.  Xanax craving,   lithium   900, worse when he stopped lithium  Lamotrigine  NR CO dizzy  NAC 600 helped Modafinil  200 agitated Took lorazepam  in the past, Xanax less helpful Ambien worked.    B severe OCD obsessions psych tx.   Died OD pain pills and Seroquel  Other B Seroquel  for anxiety.  Review of Systems:  Review of  Systems  Constitutional:  Positive for fatigue. Negative for unexpected weight change.  Cardiovascular:  Negative for palpitations.  Gastrointestinal:  Positive for constipation. Negative for nausea.  Neurological:  Negative for tremors.  Psychiatric/Behavioral:  Positive for dysphoric mood. Negative for agitation, behavioral problems, confusion, decreased concentration, hallucinations, self-injury, sleep disturbance and suicidal ideas. The patient is nervous/anxious. The patient is not hyperactive.     Medications: I have reviewed the patient's current medications.  Current Outpatient Medications  Medication Sig Dispense Refill   amLODipine  (NORVASC ) 5 MG tablet Take 1 tablet (5 mg total) by mouth daily. 90 tablet 3   atorvastatin  (LIPITOR) 80 MG tablet Take 1 tablet (80 mg total) by mouth daily. (Patient taking differently: Take 40 mg by mouth daily.) 90 tablet 3   Cholecalciferol (VITAMIN D3) 125 MCG (5000 UT) CAPS Take 5,000 Units by mouth daily in the afternoon.     lithium  carbonate 300 MG capsule Take 3 capsules (900 mg total) by mouth at bedtime. 270 capsule 0   NON FORMULARY Pt uses a c-pap nightly     PARoxetine  (PAXIL ) 30 MG tablet Take 1 tablet (30 mg total) by mouth daily. 90 tablet 0   QUEtiapine  (SEROQUEL  XR) 300 MG 24 hr tablet Take 1 tablet (300 mg total) by mouth at bedtime. 30 tablet 0   No current facility-administered medications for this visit.    Medication Side Effects: sexual, weight  Allergies: No Known Allergies  Past Medical History:  Diagnosis Date   Alcohol abuse    Anxiety    Bimalleolar fracture of left ankle    Depression    Heart murmur    HLD (hyperlipidemia)    HTN (hypertension)    MVP (mitral valve prolapse)    moderate MVP of the middle scallop of the posterior MVL with moderate to severe MR echo 11/2023   Sleep apnea    uses CPAP nightly   Tobacco use    Vitamin D  deficiency     Family History  Problem Relation Age of Onset   Cancer  Maternal Grandfather    Colon cancer Neg Hx    Colon polyps Neg Hx    Esophageal cancer Neg Hx    Rectal cancer Neg Hx    Stomach cancer Neg Hx     Social History   Socioeconomic History   Marital status: Single    Spouse name: Not on file   Number of children: Not on file   Years of education: Not on file   Highest education level: Associate degree: occupational, scientist, product/process development, or vocational program  Occupational History   Not on file  Tobacco Use   Smoking status: Former    Current packs/day: 0.00    Average packs/day: 1.5 packs/day for 48.0 years (72.0 ttl pk-yrs)    Types: Cigarettes    Start date: 03/22/1975    Quit date: 2025    Years since quitting: 0.9    Passive exposure: Past   Smokeless tobacco: Never   Tobacco comments:    He reports he has quit 3 times and recently started back in 2012. Hsm     1/2 PPD  Vaping Use   Vaping status: Never Used  Substance and Sexual Activity   Alcohol use: Not Currently   Drug use: No   Sexual activity: Not on file  Other Topics Concern   Not on file  Social History Narrative   Not on file   Social Drivers of Health   Tobacco Use: Medium Risk (03/06/2024)   Patient History    Smoking Tobacco Use: Former  Smokeless Tobacco Use: Never    Passive Exposure: Past  Financial Resource Strain: Low Risk (02/18/2024)   Overall Financial Resource Strain (CARDIA)    Difficulty of Paying Living Expenses: Not very hard  Food Insecurity: No Food Insecurity (02/19/2024)   Epic    Worried About Programme Researcher, Broadcasting/film/video in the Last Year: Never true    Ran Out of Food in the Last Year: Never true  Transportation Needs: No Transportation Needs (02/19/2024)   Epic    Lack of Transportation (Medical): No    Lack of Transportation (Non-Medical): No  Physical Activity: Inactive (02/19/2024)   Exercise Vital Sign    Days of Exercise per Week: 0 days    Minutes of Exercise per Session: 0 min  Stress: No Stress Concern Present (02/19/2024)    [employer] of Occupational Health - Occupational Stress Questionnaire    Feeling of Stress: Only a little  Social Connections: Socially Isolated (02/19/2024)   Social Connection and Isolation Panel    Frequency of Communication with Friends and Family: More than three times a week    Frequency of Social Gatherings with Friends and Family: Three times a week    Attends Religious Services: Never    Active Member of Clubs or Organizations: No    Attends Banker Meetings: Never    Marital Status: Never married  Intimate Partner Violence: Not At Risk (02/19/2024)   Epic    Fear of Current or Ex-Partner: No    Emotionally Abused: No    Physically Abused: No    Sexually Abused: No  Depression (PHQ2-9): Medium Risk (02/19/2024)   Depression (PHQ2-9)    PHQ-2 Score: 6  Alcohol Screen: Low Risk (02/14/2023)   Alcohol Screen    Last Alcohol Screening Score (AUDIT): 0  Housing: Low Risk (02/19/2024)   Epic    Unable to Pay for Housing in the Last Year: No    Number of Times Moved in the Last Year: 0    Homeless in the Last Year: No  Utilities: Not At Risk (02/19/2024)   Epic    Threatened with loss of utilities: No  Health Literacy: Adequate Health Literacy (02/19/2024)   B1300 Health Literacy    Frequency of need for help with medical instructions: Never    Past Medical History, Surgical history, Social history, and Family history were reviewed and updated as appropriate.   Please see review of systems for further details on the patient's review from today.   Objective:   Physical Exam:  There were no vitals taken for this visit.  Physical Exam Constitutional:      General: He is not in acute distress.    Appearance: Normal appearance. He is well-developed.     Comments: Red face  Musculoskeletal:        General: No deformity.  Neurological:     Mental Status: He is alert and oriented to person, place, and time.     Motor: No tremor.     Coordination:  Coordination normal.     Gait: Gait normal.  Psychiatric:        Attention and Perception: Attention normal. He is attentive.        Mood and Affect: Mood is anxious and depressed. Affect is not labile, blunt, angry or inappropriate.        Speech: Speech normal.        Behavior: Behavior normal. Behavior is not agitated or slowed.        Thought  Content: Thought content normal. Thought content is not delusional. Thought content does not include homicidal or suicidal ideation. Thought content does not include suicidal plan.        Cognition and Memory: Cognition normal.        Judgment: Judgment normal.     Comments: Insight  Fair. Continued mood problems  Less obsessive right now for unclear reasons       Lab Review:     Component Value Date/Time   NA 140 01/25/2024 0906   NA 139 01/18/2024 1501   K 4.1 01/25/2024 0906   CL 104 01/18/2024 1501   CO2 21 01/18/2024 1501   GLUCOSE 88 01/18/2024 1501   GLUCOSE 106 (H) 05/26/2023 1113   BUN 18 01/18/2024 1501   CREATININE 1.12 01/18/2024 1501   CREATININE 1.01 03/01/2022 0904   CALCIUM  9.4 01/18/2024 1501   PROT 7.0 05/26/2023 1113   ALBUMIN 4.4 05/26/2023 1113   AST 18 05/26/2023 1113   ALT 39 05/26/2023 1113   ALKPHOS 97 05/26/2023 1113   BILITOT 0.4 05/26/2023 1113       Component Value Date/Time   WBC 9.2 01/18/2024 1501   WBC 6.4 05/26/2023 1113   RBC 5.10 01/18/2024 1501   RBC 4.81 05/26/2023 1113   HGB 12.9 (L) 01/25/2024 0906   HGB 15.9 01/18/2024 1501   HGB 15.2 05/05/2010 1049   HCT 38.0 (L) 01/25/2024 0906   HCT 46.2 01/18/2024 1501   HCT 44.8 05/05/2010 1049   PLT 228 01/18/2024 1501   MCV 91 01/18/2024 1501   MCV 89.6 05/05/2010 1049   MCH 31.2 01/18/2024 1501   MCH 30.4 05/05/2010 1049   MCHC 34.4 01/18/2024 1501   MCHC 34.7 05/26/2023 1113   RDW 11.9 01/18/2024 1501   RDW 13.1 05/05/2010 1049   LYMPHSABS 2.5 07/21/2022 1143   LYMPHSABS 1.8 05/05/2010 1049   MONOABS 0.8 07/21/2022 1143    MONOABS 0.4 05/05/2010 1049   EOSABS 0.6 07/21/2022 1143   EOSABS 0.2 05/05/2010 1049   BASOSABS 0.1 07/21/2022 1143   BASOSABS 0.0 05/05/2010 1049    Lithium  Lvl  Date Value Ref Range Status  12/01/2023 0.8 0.6 - 1.2 mmol/L Final  02/22/23 lithium  0.7 on 900 mg daily. 04/01/22 lithium  level 0.9 on 1200 mg daily  02/02/21 lithium  level 0.6 on 900 mg daily  05/2023 vit D 30 started 5000 units helping fatigue  No results found for: PHENYTOIN, PHENOBARB, VALPROATE, CBMZ   .res Assessment: Plan:     Cobey was seen today for follow-up, depression and anxiety.  Diagnoses and all orders for this visit:  Bipolar II disorder (HCC) -     QUEtiapine  (SEROQUEL  XR) 300 MG 24 hr tablet; Take 1 tablet (300 mg total) by mouth at bedtime.  Mixed obsessional thoughts and acts -     PARoxetine  (PAXIL ) 30 MG tablet; Take 1 tablet (30 mg total) by mouth daily.  GAD (generalized anxiety disorder) -     PARoxetine  (PAXIL ) 30 MG tablet; Take 1 tablet (30 mg total) by mouth daily.  OSA (obstructive sleep apnea)  Lithium  use  Low vitamin D  level  Low serum vitamin B12   Chronic recurrent rumination with anxiety and urgency and sense of need and instablity with impatience ongoing with frequent phone calls between appts. but  but anhedonic depression and anxious.  Needs a lot of reassurance. Chronic worry and overwhelmed easily and misattributes sx as SE of meds leading to inadequate med trials.  Fear of meds.Falsely  attributes his psych sx to the meds and then stops meds AMA.  False attribution of sx as SE.  But overall this is less of a problem than it was.  he describe history other manic cycling symptoms including increased interest, increased goal-directed behaviors, increased urge to spend that will last for 6 weeks or so and then stop.  He has these cycles about 3 times a year.  This is suggestive of a bipolar predisposition which was confirmed by recent cycle into mania from  clomipramine .  Explained bipolar disorder underlying as reason never responded consistently to AD alone.  Anhedonic dep and anxiety better with Seroquel  400 and paroxetine  30 added.  Option pramipexole but difficult to use with quetiapine .   Continue CPAP use. New CPAP machine.  Discussed potential metabolic side effects associated with atypical antipsychotics, as well as potential risk for movement side effects. Advised pt to contact office if movement side effects occur.  He agrees.  Call if you have any problems with this. Failed attemp to reduce Seroquel  below 400 mg daily. Consider alternative Caplyta.    He remains sober.    Bc late 2023 mania with clomipramine  increased lithium  1200  mg daily .   He cut it back to 900 mg daily.  He admits to feeling worse when he stopped the lithium .  Call if sx get worse. Counseled patient regarding potential benefits, risks, and side effects of lithium  to include potential risk of lithium  affecting thyroid  and renal function.  Discussed need for periodic lab monitoring to determine drug level and to assess for potential adverse effects.  Counseled patient regarding signs and symptoms of lithium  toxicity and advised that they notify office immediately or seek urgent medical attention if experiencing these signs and symptoms.  Patient advised to contact office with any questions or concerns. 02/02/21  02/02/21 lithium  level 0.6 on 900 mg daily, normal B12 and vitamin D  52 04/01/22 lithium  level 0.9 on 1200 mg daily  02/02/21 lithium  level 0.6 on 900 mg daily 02/22/23 lithium  0.7 on 900 mg daily. 12/01/23 0.8 on 900 mg daily.  Disc the off-label use of N-Acetylcysteine at 600 mg daily to help with mild cognitive problems.  It can be combined with a B-complex vitamin as the B-12 and folate have been shown to sometimes enhance the effect.  Partial benefit so continue NAC 1200 mg daily.  Continue vitamin D  DT history level 30  Constipation management 1.   Lots of water  2.  Powdered fiber supplement such as MiraLAX, Citrucel, etc. preferably with a meal 3.  2 stool softeners a day 4.  Milk of magnesia or magnesium tablets if needed  Plan: paroxetine  30 for OCD, continue lithium  900.  Start Caplyta 1 nightly. Reduce Seroquel  XR 300 mg nightly for 2 weeks, Then cut it 1/2 of 300 mg nightly for 2 weeks and then stop it.  Talk with insurance rep about Caplyta, option retry risperidone , Geodon, Latuda ,   Try to stay as active as possible.  Encourage [facility] support group.  Disc therapy for compulsive confession.  Please see After Visit Summary for patient specific instructions.  FU 4 weeks  Lorene Macintosh, MD, DFAP[facility]     Future Appointments  Date Time Provider Department Center  03/06/2024  2:00 PM [facility] PROVID[facility] None  03/27/2024 10:30 AM Cottle, Lorene KANDICE Raddle., MD [facility] None  05/08/2024 11:00 AM Cottle, Lorene KANDICE Raddle., MD [facility] None  02/24/2025 10:40 AM [facility]-ANNUAL WELLNESS VISIT [facility] [address]  No orders of the defined types were placed in this encounter.       -------------------------------

## 2024-03-06 NOTE — Patient Instructions (Signed)
 [name], thank you for joining [name], PA-C for today's virtual visit.  While this provider is not your primary care provider (PCP), if your PCP is located in our provider database this encounter information will be shared with them immediately following your visit.  Consent: (Patient) [name] provided verbal consent for this virtual visit at the beginning of the encounter.  Current Medications:  Current Outpatient Medications:    amLODipine  (NORVASC ) 5 MG tablet, Take 1 tablet (5 mg total) by mouth daily., Disp: 90 tablet, Rfl: 3   atorvastatin  (LIPITOR) 80 MG tablet, Take 1 tablet (80 mg total) by mouth daily. (Patient taking differently: Take 40 mg by mouth daily.), Disp: 90 tablet, Rfl: 3   Cholecalciferol (VITAMIN D3) 125 MCG (5000 UT) CAPS, Take 5,000 Units by mouth daily in the afternoon., Disp: , Rfl:    lithium  carbonate 300 MG capsule, Take 3 capsules (900 mg total) by mouth at bedtime., Disp: 270 capsule, Rfl: 0   NON FORMULARY, Pt uses a c-pap nightly, Disp: , Rfl:    PARoxetine  (PAXIL ) 30 MG tablet, Take 1 tablet (30 mg total) by mouth daily., Disp: 90 tablet, Rfl: 0   QUEtiapine  (SEROQUEL  XR) 300 MG 24 hr tablet, Take 1 tablet (300 mg total) by mouth at bedtime., Disp: 30 tablet, Rfl: 0   When you are getting low on your smoking cessation medications, schedule your next video appointment as previously discussed. This way we can follow-up with you, make any necessary adjustments/changes, and get next fill of medication sent in to your pharmacy.   Follow-Up: Call back or seek an in-person evaluation if the symptoms worsen or if the condition fails to improve as anticipated.  Other Instructions  Congratulations for your interest in quitting smoking!  Quitting smoking is one of the most important things you can do to protect your health.  We are here to help you! Did you know that if you quit smoking 1 pack per day you could save up to $2550.00 per  year?  Medications are not appropriate for everyone depending upon your situation and any health issues you may have. If we do prescribe medication, it will be for 1 month at time and you will be required to have a follow up Video visit at 1 month to assess how you are doing and if there are any side effects.  This could be for up to a total of 3 months.    Support from your friends, family and work colleagues is very important. Please let them know that you are trying to stop smoking so that they understand your need for support in this goal!  - Remove tobacco products from your environment - Set a quit date ideally within 2 weeks. - You should totally abstain from smoking after your quit date. A single puff could hurt your progress or cause you to relapse - If there are others in your household that smoke, ask them to try to quit or abstain from smoking in your presence  You may notice nicotine  withdrawal symptoms such as increased appetite and weight gain, changes in mood, insomnia, irritability and/or anxiety. These symptoms peak in the first three days after smoking cessation and subside over the next 3-4 weeks.   A combination of behavioral and medication can improve the success of you quitting smoking. You may want to use the 1-800-QUIT-NOW free support line.  Also, the Department of Health and [employer] provides Smoke free apps for smartphones:  sharedcustomer.fi  If you happen to break your plan and have a cigarette, keep taking your medications and continue to try to abstain.  Do not give up!  MAKE SURE YOU  Take any prescribed medications only as instructed.  If you miss a dose of medication, take the next dose when it is due and get back on schedule. DO NOT double up on medications.  Mark your calendar to do your Follow Up Smoking Cessation Visit in one month   If you have been instructed to have an in-person evaluation today at a local Urgent Care facility,  please use the link below. It will take you to a list of all of our available [facility], including address, phone number and hours of operation. Please do not delay care.  [facility]  If you or a family member do not have a primary care provider, use the link below to schedule a visit and establish care. When you choose a [facility] primary care physician or advanced practice provider, you gain a long-term partner in health. Find a Primary Care Provider  Learn more about [facility] in-office and virtual care options: [facility] - Get Care Now

## 2024-03-06 NOTE — Progress Notes (Signed)
 Virtual Visit Consent   Jonathon Walker, you are scheduled for a virtual visit with a [facility] provider today. Just as with appointments in the office, your consent must be obtained to participate. Your consent will be active for this visit and any virtual visit you may have with one of our providers in the next 365 days. If you have a MyChart account, a copy of this consent can be sent to you electronically.  As this is a virtual visit, video technology does not allow for your provider to perform a traditional examination. This may limit your provider's ability to fully assess your condition. If your provider identifies any concerns that need to be evaluated in person or the need to arrange testing (such as labs, EKG, etc.), we will make arrangements to do so. Although advances in technology are sophisticated, we cannot ensure that it will always work on either your end or our end. If the connection with a video visit is poor, the visit may have to be switched to a telephone visit. With either a video or telephone visit, we are not always able to ensure that we have a secure connection.  By engaging in this virtual visit, you consent to the provision of healthcare and authorize for your insurance to be billed (if applicable) for the services provided during this visit. Depending on your insurance coverage, you may receive a charge related to this service.  I need to obtain your verbal consent now. Are you willing to proceed with your visit today? Jonathon Walker has provided verbal consent on 03/06/2024 for a virtual visit (video or telephone). Jonathon CHRISTELLA Dickinson, PA-C  Date: 03/06/2024 2:16 PM  Virtual Visit via Video Note   I, Jonathon Walker, connected with  Jonathon Walker  ([id number], 1965/05/11) on 03/06/2024 at  2:00 PM EST by a video-enabled telemedicine application and verified that I am speaking with the correct person using two identifiers.  Location: Patient: Virtual Visit  Location Patient: Home Provider: Virtual Visit Location Provider: Home Office   I discussed the limitations of evaluation and management by telemedicine and the availability of in person appointments. The patient expressed understanding and agreed to proceed.    History of Present Illness: [name] is wanting to discuss their tobacco use and is seeking assistance with cessation. At this time, he is following up a 4 week visit from his last and is now 6-7 weeks since he quit smoking on 01/23/24. He has continued to not smoke. He does report cravings have increased some, but he feels he is holding strong and his previous health scares (heart cath) have been motivators for him to stay quit at this time. He has not utilized the Nicorette  gum at all.    Triggers: The following trigger(s) for use has/have been identified: psychological: social  Withdrawal Symptoms: Identified withdrawal symptoms: none, overall he reports doing well.  State of Readiness: Management: Successfully quit smoking on 01/23/24 Concerns/Barriers to Cessation - Lack of support  Observations/Objective: Patient is well-developed, well-nourished in no acute distress.  Resting comfortably at home.  Head is normocephalic, atraumatic.  No labored breathing.  Speech is clear and coherent with logical content.  Patient is alert and oriented at baseline.    Assessment and Plan: There are no diagnoses linked to this encounter.  - Patient is currently at the following State of Change: Maintenance -- achieved smoking cessation - Comorbid conditions identified: CAD, MVP, OSA, MDD, GAD - Reviewed impacts of smoking on patient's  health. - Congratulated patient on continued success - Other resources including the [employer] and Department of Health and [employer] -- have been included in the patient's written instructions and sent to their MyChart. - Plan for follow-up in None. Patient feels well to proceed without  future appointments, but will reach out if needed  Follow Up Instructions: I discussed the assessment and treatment plan with the patient. The patient was provided an opportunity to ask questions and all were answered. The patient agreed with the plan and demonstrated an understanding of the instructions.  A copy of instructions were sent to the patient via MyChart unless otherwise noted below.    Time:  I have spent 13 minutes with the patient via telehealth technology in tobacco cessation counseling.    Jonathon CHRISTELLA Dickinson, PA-C

## 2024-03-12 ENCOUNTER — Telehealth: Payer: Self-pay | Admitting: Psychiatry

## 2024-03-12 NOTE — Telephone Encounter (Signed)
 LVM to RC. Asked him to send message via MyChart since phones are off and we are off the rest of the week.

## 2024-03-12 NOTE — Telephone Encounter (Signed)
 Pt called to report effects with new med Caplyta. Blurred vision, balance off and fuzzy headed.  Asking Dawna to return call [phone]

## 2024-03-18 NOTE — Telephone Encounter (Signed)
 He can stay on Seroquel  150 with Caplyta if that's what he wants to do, until hi s next appt with me.

## 2024-03-18 NOTE — Telephone Encounter (Signed)
 Patient lvm 12/27 at 6:29pm stating that he was taking one whole Caplyta from 12/17-12/24 and than 1/2 a pill since than. He realized that he was supposed to stay on 300mg . He has been experiencing some side effects such as terrible dizziness where he fell down one day. He didn't take any Caplyta one night. Pls rtc to discuss how he should proceed going forward with taking medication. PH: [phone] Appt 1/7

## 2024-03-18 NOTE — Telephone Encounter (Signed)
 Pt was seen 12/17: Start Caplyta 1 nightly. Reduce Seroquel  XR 300 mg nightly for 2 weeks, Then cut it 1/2 of 300 mg nightly for 2 weeks and then stop it.  He reports he didn't take Seroquel  XR 300 for 2 weeks, took for 1 week and then reduced to 150 mg and has been on 150 mg for 4 days. He is taking the Caplyta.  He reported SE of dizziness, blurred vision (he described floaters), he fell on one occasion, and he was having confusion. He reports that sx are improving, but is asking if he can stay on Seroquel  150 with the Caplyta. He has FU 1/7.

## 2024-03-19 ENCOUNTER — Ambulatory Visit (INDEPENDENT_AMBULATORY_CARE_PROVIDER_SITE_OTHER): Admitting: Adult Health

## 2024-03-19 ENCOUNTER — Encounter: Payer: Self-pay | Admitting: Adult Health

## 2024-03-19 VITALS — BP 138/80 | HR 78 | Temp 98.7°F | Wt 304.2 lb

## 2024-03-19 DIAGNOSIS — D492 Neoplasm of unspecified behavior of bone, soft tissue, and skin: Secondary | ICD-10-CM | POA: Diagnosis not present

## 2024-03-19 NOTE — Telephone Encounter (Signed)
"  Patient notified   "

## 2024-03-19 NOTE — Progress Notes (Signed)
 "  Subjective:    Patient ID: Jonathon Walker, male    DOB: 1966/03/16, 58 y.o.   MRN: [id number]  HPI Discussed the use of AI scribe software for clinical note transcription with the patient, who gave verbal consent to proceed.  History of Present Illness   Jonathon Walker is a 58 year old male who presents with a  bump on his right leg.  He noticed a black bump on his right leg about six to eight months ago. He has peeled it off multiple times, but it grows back in the same area.        Review of Systems See HPI   Past Medical History:  Diagnosis Date   Alcohol abuse    Anxiety    Bimalleolar fracture of left ankle    Depression    Heart murmur    HLD (hyperlipidemia)    HTN (hypertension)    MVP (mitral valve prolapse)    moderate MVP of the middle scallop of the posterior MVL with moderate to severe MR echo 11/2023   Sleep apnea    uses CPAP nightly   Tobacco use    Vitamin D  deficiency     Social History   Socioeconomic History   Marital status: Single    Spouse name: Not on file   Number of children: Not on file   Years of education: Not on file   Highest education level: Associate degree: occupational, scientist, product/process development, or vocational program  Occupational History   Not on file  Tobacco Use   Smoking status: Former    Current packs/day: 0.00    Average packs/day: 1.5 packs/day for 48.0 years (72.0 ttl pk-yrs)    Types: Cigarettes    Start date: 03/22/1975    Quit date: 2025    Years since quitting: 0.9    Passive exposure: Past   Smokeless tobacco: Never   Tobacco comments:    Quit 1.5 ago  Vaping Use   Vaping status: Never Used  Substance and Sexual Activity   Alcohol use: Not Currently   Drug use: No   Sexual activity: Not on file  Other Topics Concern   Not on file  Social History Narrative   Not on file   Social Drivers of Health   Tobacco Use: Medium Risk (03/19/2024)   Patient History    Smoking Tobacco Use: Former    Smokeless Tobacco Use:  Never    Passive Exposure: Past  Physicist, Medical Strain: Low Risk (02/18/2024)   Overall Financial Resource Strain (CARDIA)    Difficulty of Paying Living Expenses: Not very hard  Food Insecurity: No Food Insecurity (02/19/2024)   Epic    Worried About Radiation Protection Practitioner of Food in the Last Year: Never true    Ran Out of Food in the Last Year: Never true  Transportation Needs: No Transportation Needs (02/19/2024)   Epic    Lack of Transportation (Medical): No    Lack of Transportation (Non-Medical): No  Physical Activity: Inactive (02/19/2024)   Exercise Vital Sign    Days of Exercise per Week: 0 days    Minutes of Exercise per Session: 0 min  Stress: No Stress Concern Present (02/19/2024)   [employer] of Occupational Health - Occupational Stress Questionnaire    Feeling of Stress: Only a little  Social Connections: Socially Isolated (02/19/2024)   Social Connection and Isolation Panel    Frequency of Communication with Friends and Family: More than three times a week  Frequency of Social Gatherings with Friends and Family: Three times a week    Attends Religious Services: Never    Active Member of Clubs or Organizations: No    Attends Banker Meetings: Never    Marital Status: Never married  Intimate Partner Violence: Not At Risk (02/19/2024)   Epic    Fear of Current or Ex-Partner: No    Emotionally Abused: No    Physically Abused: No    Sexually Abused: No  Depression (PHQ2-9): Medium Risk (02/19/2024)   Depression (PHQ2-9)    PHQ-2 Score: 6  Alcohol Screen: Low Risk (02/14/2023)   Alcohol Screen    Last Alcohol Screening Score (AUDIT): 0  Housing: Low Risk (02/19/2024)   Epic    Unable to Pay for Housing in the Last Year: No    Number of Times Moved in the Last Year: 0    Homeless in the Last Year: No  Utilities: Not At Risk (02/19/2024)   Epic    Threatened with loss of utilities: No  Health Literacy: Adequate Health Literacy (02/19/2024)   B1300 Health  Literacy    Frequency of need for help with medical instructions: Never    Past Surgical History:  Procedure Laterality Date   ORIF ANKLE FRACTURE Left 09/19/2017   Procedure: OPEN REDUCTION INTERNAL FIXATION (ORIF) ANKLE FRACTURE;  Surgeon: Beverley Evalene BIRCH, MD;  Location: [facility];  Service: Orthopedics;  Laterality: Left;   RIGHT/LEFT HEART CATH AND CORONARY ANGIOGRAPHY N/A 01/25/2024   Procedure: RIGHT/LEFT HEART CATH AND CORONARY ANGIOGRAPHY;  Surgeon: Jordan, Peter M, MD;  Location: [facility] INVASIVE CV LAB;  Service: Cardiovascular;  Laterality: N/A;   TOOTH EXTRACTION     TRANSESOPHAGEAL ECHOCARDIOGRAM (CATH LAB) N/A 12/13/2023   Procedure: TRANSESOPHAGEAL ECHOCARDIOGRAM;  Surgeon: Loni Soyla LABOR, MD;  Location: [facility] INVASIVE CV LAB;  Service: Cardiovascular;  Laterality: N/A;    Family History  Problem Relation Age of Onset   Cancer Maternal Grandfather    Colon cancer Neg Hx    Colon polyps Neg Hx    Esophageal cancer Neg Hx    Rectal cancer Neg Hx    Stomach cancer Neg Hx     Allergies[1]  Medications Ordered Prior to Encounter[2]  BP 138/80   Pulse 78   Temp 98.7 F (37.1 C)   Wt (!) 304 lb 3.2 oz (138 kg)   SpO2 95%   BMI 38.02 kg/m       Objective:   Physical Exam Vitals and nursing note reviewed.  Constitutional:      Appearance: Normal appearance.  Skin:    General: Skin is warm and dry.     Capillary Refill: Capillary refill takes less than 2 seconds.      Neurological:     General: No focal deficit present.     Mental Status: He is alert and oriented to person, place, and time.  Psychiatric:        Mood and Affect: Mood normal.        Behavior: Behavior normal.        Thought Content: Thought content normal.        Judgment: Judgment normal.           Assessment & Plan:  1. Skin neoplasm (Primary) Procedure including risks/benefits explained to patient.  Questions were answered. After informed consent was obtained and a  time out completed, site was cleansed with betadine and then alcohol. 1% Lidocaine  with epinephrine  was injected under lesion and  then shave biopsy was performed. Area was cauterized to obtain hemostasis.  Pt tolerated procedure well.  Specimen sent for pathology review.  Pt instructed to keep the area dry for 24 hours and to contact us  if he develops redness, drainage or swelling at the site.  Pt may use tylenol  as needed for discomfort today.   - Dermatology pathology  Darleene Shape, NP     [1] No Known Allergies [2]  Current Outpatient Medications on File Prior to Visit  Medication Sig Dispense Refill   amLODipine  (NORVASC ) 5 MG tablet Take 1 tablet (5 mg total) by mouth daily. 90 tablet 3   atorvastatin  (LIPITOR) 80 MG tablet Take 1 tablet (80 mg total) by mouth daily. 90 tablet 3   Cholecalciferol (VITAMIN D3) 125 MCG (5000 UT) CAPS Take 5,000 Units by mouth daily in the afternoon.     lithium  carbonate 300 MG capsule Take 3 capsules (900 mg total) by mouth at bedtime. 270 capsule 0   lumateperone tosylate (CAPLYTA) 42 MG capsule Take 42 mg by mouth daily.     NON FORMULARY Pt uses a c-pap nightly     PARoxetine  (PAXIL ) 30 MG tablet Take 1 tablet (30 mg total) by mouth daily. 90 tablet 0   QUEtiapine  (SEROQUEL  XR) 300 MG 24 hr tablet Take 1 tablet (300 mg total) by mouth at bedtime. 30 tablet 0   No current facility-administered medications on file prior to visit.   "

## 2024-03-20 LAB — DERMATOLOGY PATHOLOGY

## 2024-03-22 ENCOUNTER — Ambulatory Visit: Payer: Self-pay | Admitting: Adult Health

## 2024-03-27 ENCOUNTER — Encounter: Payer: Self-pay | Admitting: Psychiatry

## 2024-03-27 ENCOUNTER — Ambulatory Visit: Admitting: Psychiatry

## 2024-03-27 DIAGNOSIS — Z79899 Other long term (current) drug therapy: Secondary | ICD-10-CM

## 2024-03-27 DIAGNOSIS — E538 Deficiency of other specified B group vitamins: Secondary | ICD-10-CM

## 2024-03-27 DIAGNOSIS — F1021 Alcohol dependence, in remission: Secondary | ICD-10-CM

## 2024-03-27 DIAGNOSIS — G4733 Obstructive sleep apnea (adult) (pediatric): Secondary | ICD-10-CM | POA: Diagnosis not present

## 2024-03-27 DIAGNOSIS — F3181 Bipolar II disorder: Secondary | ICD-10-CM | POA: Diagnosis not present

## 2024-03-27 DIAGNOSIS — F411 Generalized anxiety disorder: Secondary | ICD-10-CM | POA: Diagnosis not present

## 2024-03-27 DIAGNOSIS — F422 Mixed obsessional thoughts and acts: Secondary | ICD-10-CM

## 2024-03-27 DIAGNOSIS — R7989 Other specified abnormal findings of blood chemistry: Secondary | ICD-10-CM

## 2024-03-27 MED ORDER — QUETIAPINE FUMARATE 50 MG PO TABS: 50.0000 mg | ORAL_TABLET | Freq: Every day | ORAL | 1 refills | Status: AC

## 2024-03-27 MED ORDER — LITHIUM CARBONATE 300 MG PO CAPS: 900.0000 mg | ORAL_CAPSULE | Freq: Every evening | ORAL | 0 refills | Status: AC

## 2024-03-27 MED ORDER — LUMATEPERONE TOSYLATE 42 MG PO CAPS: 42.0000 mg | ORAL_CAPSULE | Freq: Every day | ORAL | 1 refills | Status: AC

## 2024-03-27 NOTE — Progress Notes (Signed)
 Jonathon Walker [id number] February 10, 1966 59 y.o.  Subjective:   Patient ID:  Jonathon Walker is a 59 y.o. (DOB [birth year]) male.  Chief Complaint:  Chief Complaint  Patient presents with   Follow-up   Depression   Anxiety   Medication Reaction    Jonathon Walker presents to the office today for follow-up of anxiety , depression, alcohol.    seen August 7.  His anxiety was unmanaged at Paxil  40 mg and olanzapine  5 mg so olanzapine  was increased to 7.5 daily.  seen December 17, 2018.  His anxiety was unmanaged.  The following change was made: Trial 1-1/2 of the 7.5 mg tablets of olanzapine  for anxi.  He is had a stay at [facility] for alcohol dependence since he was last here. Stayed 28 days and DC before Thanksgiving.  Very helpful.  Was having NM nighly before and they stopped since then.  Not drinking.  Involved in AA since then.  seen March 25, 2019.  The patient was doing better requested a reduction in olanzapine  to 7.5 mg nightly to improve energy.  visit April 03, 2019.  He had remained sober and his anxiety was improved with sobriety.  He was having problems with low motivation and forgetfulness.  At the next refill it was decided olanzapine  would be reduced from 7.5 to 5 mg daily. He called requesting this urgent appointment because of worsening symptoms associated with returning to work.  seen May 21, 2019.  He was doing relatively well.  The following was noted: Tolerating Wellbutrin  and it seems to help. He didn't tolerate the increase. Anxiety is improved off alcohol.  Ok to reduce olanzapine  to 2.5 mg daily for a month and stop it.  Hopefully low motivation will improve.   He called back March 23 stating he was more depressed and having a harder time doing routine tasks and having suicidal thoughts.  Because of worsening depression after reducing the olanzapine  he was encouraged to increase olanzapine  back to 5 mg nightly. He called again March 24 stating he was  really struggling but was scheduled for an appointment on March 26.  As of June 14, 2019 he reports the following: Every day a real hard struggle to get through the days.  More anxious and depressed equally. Worse 2 weeks.  Last Saturday so overwhelmed by how he feels he had SI.  Everything is hard. Initial benefit paroxetine  has been lost.  Awakens stressed and tired. Adequate hours of sleep.  Will be major chore just to mow grass.  Still sober. SE sexual. Getting appt with CPAP doc. Changes made include: Increasing olanzapine  back to 5 mg daily a couple of days prior to the appointment due to phone call and the addition of lithium  300 mg for a few days then increase to 600 mg nightly both to augment the antidepressant and because of suicidal thoughts.  July 15, 2019 appointment, the following is noted: Doing OK with both depression and anxiety.  Kind of like a top always going in mind.  Even when I sleep, when awakens mind still going.  NM nightly for years but less than before stopped drinking.  Smaller and less severe ones that don't wake him.  Theme can't finish things.  Both depression and anxiety 3/10.  More productive.  Paces himself.  Can live with it.  Sleep 9 hours.  Initially olanzapine  stopped the ruminating but it's back some.  Overall still benefit.  No SI and can't rmember why he  had them before. Residual energy not great and some ruminating. He had some concerns about sexual side effects from Paxil  but agreed that no med changes were necessary despite residual symptoms of depression and anxiety.  08/14/2019 appointment, the following is noted:  He scheduled urgently. Vaccinated. Angry easily and exhausted and everything is a chore.  Exhausted. Never had appt with CPAP company.  CPAP is 60 years old and on same settings as in the past.  CPAP machine is not working properly but when it did it was helpful for alertness and energy.  Disc need to call them to have it evaluated. To bed  10-7.  Don't feel rested in morning but used to feel rested when first started paroxetine . Plan: Increase Lithium  continue to 900 mg daily for irritability  11/19/19 appt with the following noted: He increased to 900 mg and then reduced it but doesn't know why.  Reduced it to 300 nightly. Irritability much better.  Once anger since here with rage but not outward. Still on paroxetine  40, olanzapine  5, Wellbutrin  75 AM. Getting tired in afternoon and worrying about getting depressed but he's not depressed now.  Don't enjoy things like he used to.  Les interest and excitement. Appetite and sleep are normal.   Still adjusting without alcohol and socialization. Worrying about lack of sex drive with paroxetine . Can still ruminate on things until he gets some reassurance through talking with people. Plan: Continue low-dose lithium  300 mg daily Continue Paxil .  Did not feel well at 60 mg so we will continue 40 mg.  Likely to lose anxiety benefit if change it. Continue low-dose Wellbutrin  75  Tolerating Wellbutrin  and it seems to help. He didn't tolerate the increase.   Continue olanzapine  5 mg  02/18/2020 appointment with following noted: Open to trying increase paroxetine  again bc ruminating wears him out and gets fatigued and it wears him out though is 60% better vs before paroxetine . Doesn't remember trying higher paroxetine .  Ruminates on relationships or work.  Whenever he can talk about it with someone it helps tremendously but also realizes paroxetine  helps. Anxiety affects dreams and NM too.  Up and down a little.  Anxiety drove depression before paroxetine .  Best med he's tried. SOBER 1 YEAR SE no problem.  Except gained 25# in 4 years.  No change in diet and exercise.  No increase in appetite and no food cravings.  Seeing PCP soon. Plan: Increase paroxetine  trial to 60 for rumination.  Disc SE.  05/26/20 appt noted: No benefit increase paroxetine  nor SE.  Wants to reduce back to 30 mg  paroxetine  and stop lithium  bc it didn't seem to help.   4 days of Calm app has seemed to help rumination.  Does it in afternoon and before bed and it seemed to helps.  Doing a 10 min meditation and it helps.   Dep 4/10.  Anxiety 4/10. And a lot better the last few days.   Plan: Reduce lithium  by 1 tablet per week.  Call if there is any suicidal thought. Starting April 1 reduce paroxetine  to 40 mg daily. Continue low-dose Wellbutrin  75  Tolerating Wellbutrin  and it seems to help. He didn't tolerate the increase.   If doing OK will try taper as he suggested at next visit. Continue olanzapine  5 mg nightly it was helpful for depression and anxiety.  07/01/2020 appt with following noted: Moved appt up. Now says he understated depression level when he was here the last time. Restarted lithium  bc  felt more depressed without it. Reduced paroxetine  to 40 mg daily. Lethargic.  Mind won't calm down.  Nonrestorative sleep but 8 hours.  Doesn't think sleep is deep enough.  Tense. Goes to [employer]. Started counseling. Wants to try something different with meds. Can have mixed sx for 6 weeks with increased interest in things and motivation and want to spend money but still feels depressed.  3 times in the last year. Plan: Increase  lithium  back to 3 daily to see if depression is better and less mood cycling.  He admits to feeling worse when he stopped the lithium  Continue the reduce paroxetine  to 45 mg daily. DC Wellbutrin  Lamotrigine  trial 25 mg to 100 mg daily over 4 weeks. Continue olanzapine  5 mg nightly it was helpful for depression and anxiety.  08/26/2020 appointment with the following noted: Sad and down.  Classic depression sx and very fatigued.  Unusual to have classic depression bc usually is atypical.  No effect from lamotrigine . Wonders if atorvastatin  is causing some confusion or depression bc read about it. Depressed for 3 mos. Gained 20#.  It's leveled off now. Olanzapine  helped  the rumination and anxiety.   Life is more depressing bc not seeing friends like it was bc stopped drinking.   No SE with lithium .  Unless dropping things. Sleep 1030 to 6 but doesn't sleep deep.  Not rested in AM Plan: Increase  lithium  back to 3 daily to see if depression is better and less mood cycling.  He admits to feeling worse when he stopped the lithium  Continue the reduce paroxetine  to 40 mg daily bc it helped anxiety. Lamotrigine  increase to 150 mg daily DT no benefit or SE Increase olanzapine10 mg nightly it was helpful for depression and anxiety.  Need more help with depression   09/11/2020 phone call: Pt stated he is foegetting things throughout the day and very unsteady.He works with tools and equptment and this is a problem.He stated he takes 6 Lamictal  in the am. MD response: We made 3 med changes at the last visit including increasing lamotrigine  to 150 mg daily, increasing olanzapine  to 10 mg daily, and restarting lithium .  Any 1 of those changes could possibly cause some of the side effects.  We will have to make 1 change at a time to evaluate this.  Therefore reduce lamotrigine  to 100 mg daily.  It may take a couple of weeks before he sees a difference.  11/13/2020 phone call: Patient lm stating he is currently taking Lamotrigine  150 mg. He has decreased it to 75 mg due to side effects per pt ( blurred vision, shaking and coordination issues). Patient stated he is discontinuing before his scheduled October appointment. MD response: Take the lamotrigine  75 mg for 2 weeks and he should be able to stop then without withdrawal.  If he gets shakey, nervous then we need to reduce more slowly and let us  know that.  Otherwise he can stop if he follows these directions.  11/30/2020 phone call: Pt called and advised he would like to taper off of the Olanzapine .  He has gained 8# since his last visit and 30# total since he started taking the med.  He feels steady, not so much stress now, and  would like to see if he can take Lorazepam instead.  Then talk to Dr. Geoffry in Oct when they meet. MD response: Reduce olanzapine  to one half of the 10 mg tablet at night for 4 weeks and then stop it.  If he gets more depressed then get back in touch with us  and we will try to do something about the weight gain that olanzapine  can cause by using the alternative Lybalvi  12/07/2020 appointment with the following noted: Off lamotrigine  .  Reduced olanzapine  to 5 mg for a week.  On lithium  900 mg HS, paroxetine  40 Fatigue 10/10.  Dizziness resolved off lamotrigine .  Low fatigue and motivation.  I want to want to do things.  Stopped drinking almost 18 mos and socially big adjustment.   Still invloved with AA and daily sponsor.  Working [employer].  Haroldine Young helped. Thinks mild depression with little interest and motivation and no joy.  But not severe.  No SI. No anxiety and it's way better.  Life is more stable and helps.  Working 4 hours daily and occ extrea jobs. Uses new CPAP.  Stil drowsy and fatigue. Plan: Continue CPAP use. New CPAP machine. Trial modafinil  100-200 mg AM Lamotrigine  off DT NR Reduced olanzapine  to 5 mg daily for a week and well stop in 3 weeks DT wt gain and fatigue  01/20/2021 phone call: I called patient to see if I could get better information. He states he started modafinil  a couple of weeks ago, though Rx was written for in September. He states he didn't feel well before taking the modafinil  though. He just says he is agitated, irritable, and unstable feeling. When asked what he meant by unstable he stated feeling a shell of myself.  He has been put on the cancellation list.  MD response: It is likely the modafinil  that is causing this problem.  But it may be corrected with a dose reduction.  I assume he is taking a whole tablet.  If that is the case time to reduce to 1/2 tablet in the morning.  If he is taking half a tablet tell him to reduce it to a quarter of a tablet.   And I agree we need to try to get him in as soon as possible. He reduced modafinil  from 200 mg to 100 mg daily.  02/10/2021 appointment with the following noted: Current psych meds lithium  900 mg nightly, paroxetine  40 mg daily, olanzapine  recently stopped, modafinil  recently tried. Was really bad for awhile feeling depresssed and agitated and anxiety but better now including after reducing modafinil  to 100 mg daily. Fatigue is better and in the middle now with energy, not good but not bad. Sleep is better.  CPAP doctor yesterday and they are gathering info pending. NAC is helping with memory. Still sober and committed to it. SE constipation  Plan: No med changes  03/10/2021 phone call from patient complaining of vague feelings of not feeling well and wanting the Paxil  changed.  He was placed on the cancellation list.  03/18/2021 appointment urgently scheduled at his request: Remains on paroxetine  40 mg daily, lithium  1200 mg daily, modafinil  200 mg every morning. Ruminating  with sense of need to talk to someone.  Then feels better for awhile.  Mostly on relationships with family.  No one to talk to. Wonders need to chang ethe meds.   Don't socialize anymore since sober.  Does online AA everyday at noon. Lost 10# off olanzapine . Plan: Continue paroxetine  40 mg, lithium  1200 mg daily, Try stopping modafinil  100mg  AM to determine if it is helping or not. If not less anxious then start risperidone  for rumination 1-2 mg HS  04/05/2021 appointment with the following noted: Stopped  modafinil  and it helped  reduce anxiety. Started risperidone  2 instead of 1 mg and was off balance so reduced risperidone  to 1 mg HS. Only done this for 3 nights.  No SE with it. Anxiety is not close to as bad as originally.  Can be painful but not now. Still ruminating some my whole adult life but varies.  Anxiety can lead to depression. 6-7/10 with 10 good on depression with some problems with  ambition. Plan: Continue trial risperidone  for rumination 1 mg HS  04/07/2021 TC: Several phone calls: Called 2 days after the visit complaining of itching from the lithium  and wanting to come off of it.  He was warned he was more emotionally distressed when he reduced the lithium  in the past but he wanted to pursue it anyway.  He was continuing risperidone  1 mg nightly but wanting to reduce that also but was warned he would almost certainly have worsening mood severity and anxiety problems based on past experience.  He was cautioned against reducing lithium  below 900 mg daily and risperidone  below 1 mg nightly.  He agreed on 04/09/2021.  04/09/2021 he called again and several phone calls ensued thereafter.  He insisted on stopping lithium  and risperidone  despite warnings that his mood stability and anxiety would worsen. He was prescribed Depakote  to take the place  04/20/2021 appointment with the following noted: He stopped risperidone  and did not take the Depakote .  He is taking lithium  900 mg nightly and paroxetine  40 mg daily. Stopped risperidone  bc feeling agitated and unstable and blamed it.  Still feels the same off of it. Says itching 85% better with less lithium .  Itching for a couple of mos.  Doesn't think anxiety noticeably worse after reducing lithium  to 900 mg daily. Not dizzy.  Clumsy with hands. Anxious but not painful like it was before Paxil  but tense at the end of the day.  Initially felt normal for the first time in 20 years when first started paxil  but not as good now. Not sadness and hasn't had much of it. Started therapy with doctor on demand. Ruminating on fear of not being fully present and worries about little things like appts pending.   Afraid of working FT because gets overwhelmed bc I take it home with me Plan : Continue trial risperidone  but increase to rumination 1.5 mg HS Continue paroxetine  40 and lithium  600 mg   06/01/2021 appointment with the following  noted: Haven't seen benefit with risperidone  1.5 mg daily. Not in pain but feels disconnected and not myself.  Has felt like this off and on for years. Level of angst all day but not real high unless triggered.  Still has the rumination. Initially a lot of benefit from paroxetine  Not markedly deprssed and not sad.  07/14/21 appt noted:  Doesn't feel much different with switch to fluvoxamine  except anxiety well controlled. But still low energy, motivation and not as productive as he wants to be.  Not markedly sad.  Asks about Vraylar . Not much interest or enjoyment. Don't sleep that great and don't relax that great. On fluvoxamine  200, lithium  900, off risperidone . Plan: Anxiety controlled by fluvoxamine  200 Potentiate Vraylar  1.5 mg daily  07/27/2021 phone call complaining of ongoing depression.  Vraylar  increased to 3 mg daily. 07/29/2021 phone call complaining of cost of Vraylar .  Was given samples. 08/20/2021 phone call complaining of fatigue.  Stated he stopped the Vraylar  because it made him agitated.  It was recommended there be no further med changes because he is only been  off the Vraylar  for 1 week.  08/30/2021 appointment noted: Vraylar  1.5 mg didn't do much.  Increased to 3 mg and then felt irritable ans stopped it about 3rd week of May.  Better now. Still extremely tired and feels physically sick with normal workup. Thinks his mind wears him out.  Racing negative thoughts all the time. Asks about Ativan. Always nervous and anxious.  Trouble discerning anxiety from depression. Plan: Anxiety was controlled by fluvoxamine  200 but now he thinks it is worse. So increase 250 mg daily  10/18/21 appt noted: No results with increase fluvoxamine .  No SE. Anxiety is still worse than depression.  Thinks it causes somatic sx like the flu.  Exhausted. GI. Poor sleep and concentration. Asks about lorazepam.   Got checked out by PCP without findings. Plan: Reduce lithium  to 2 capsules daily to  see if physical symptoms like fatigue etc. are better Increase fluvoxamine  to 1 tablet in the morning and 2 tablets in the evening to reduce anxiety Plan Trial reduction of  lithium  900  to 600 mg daily to see if somatic sx are better like fatigue.   01/06/22 appt noted Everything is about the same with med changes. CC anxiety and fatigue.  Sx started with severe depression in 20s and got better but never resolved. Thinks anxiety is causing fatigue.  Has to nap about 1 pm.  Easily fatigued.  No particular worry other than how he feels and himself.. Plan: Reduce fluvoxamine  gradually to 150 mg daily Start clomipramine  25 mg nightly for 5 days and increase to 75 mg HS  02/07/22 TC:  Pt lvm that the clomiprame 25 mg has been taking the full dose for two weeks now. He is experiencing mania. Overspending etc    MD resp:  Increase the lithium  back to 3 daily.  Stop the clomipramine .  Resume the risperidone  until these symptoms clear up.  Call us  back next week and let us  know how it is going.     02/16/22 TC:   Pt was just calling to give you an update.He stated he is still 50 percent manic but feels better.He is currently taking   lithium  carbonate 300 MG 3 daily risperiDONE  3 mg 1/2 tab daily fluvoxamine  100 MG tablet 1/2 tablet in the AM and 1 tablet at night On 11/20 changes were made,you had him stop clomipramine                      and increase lithium  to 900 mg daily.  02/16/22 MD: check lithium  level.  02/24/22 tC complaining of depression. MD :  He has had recent mood swings from manic to depressed by his report.  Lithium  level is low at 0.5 on 600 mg daily.  I think the most helpful thing for his mood would be to increase lithium  back to 900 mg daily.      03/07/22 TC: pt reported increasing his Luvox  to 200 mg daily.  03/08/22 appt noted: Multiple phone calls since here.   Clomipramine  triggered mania like he's never had before.  Increased over the top looking for a house,  wanting to invent things, bought a shed and fish tank abruptly.  Left his job which was a good thing.  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  I feel a lot better about it.  Has disability and will do PT work.  I'll be OK. Exhausted and can't be on his feet all the time.  Sleep is different with EMA and ready to go but watches TV awhile and then goes back to sleep.  Always a late sleeper all his life.   Still feeling some depression and he increased the luvox  to 200 and it helped. Thinks parents are Autistic and don't respond emotionally.  M cannot understand feelings and he doesn't get any support.   Plan: Bc recent mania with clomipramine  increase lithium  1200  mg daily .    03/29/2022 appointment urgently due to recent mania: Psych meds: fluvoxamine  200, lithium  1200, risperidone  1.5 mg HS Exhausted until the last couple of days. SE tremor Has not had any water  today and is lightheaded.   Mood a little better and anxiety better than it was.  No mania. Some underlying weight in mood that only responded to paroxetine  and clomipramine . Asks about Wellbutrin  for energy. Sleep normal 8-9 hours now.  Except last night. Goes to [facility] groups and support group and AA. Sober Reduce risperidone  to 1 mg to see if energy better  05/25/22 appt noted: Reduced lithium  about 10 days ago bc thought he might be getting too much bc read on the internet.  No changes since reducing it. More dep for a couple of mos which is unusual.  In bed a lot and more isolated and not normally what I have.  Not real sad but unmotivated and no energy to do things.    Start therapist Friday. No sig sadness.  Irritable and short with people over little things.  Kind of started after stopped drinking.  Worse.  Sleep on and off.  Erratic.  Looks for ward to night so can escape into sleep.   Anxious underlying for 30 years.  Hard to sit and relax and just watch TV.  Tends to ruminate. Asks to try Gaba. Now lethargy is worse  than chronic anxiety Reducing risperidone  made no changes good or bad. DC risperidone  1 mg HS Add Latuda  1/2 tablet for 1 week then 1 tablet in the the evening  07/06/22 appt :  Increased Latuda  up to 100 mg .  No clear SE. Exhausted mentally and it makes his body tired.  Working 10-2 and then comes home and lays in bed a couple of hours.  Not a deep sleep.  Never feels fully rested.  Sleep HS 10-7.   Normal appetitie.   Still some sadness and irritability.  Anxiety doesn't seem better but is having less mood swings and mania.  Anxiety wears him down.   Plan: Increase  Latuda  120 mg in the evening with food  07/21/22 appt noted: Trouble keeping mood chart.  If I talk and I'm heard then feels better for hours but then damn stopped up.  Almost like an obsessive thing to talk for relief. No clear effect of increase Latuda  except reduced som cognitive anxiety but not physical anxiety. Says there was med he took before paxil  that gave him instant relief and wonders about retrying it.  Doesn't know which med. Paxil  had best effect when it worked for a period of time. Exhausted but fidget when sits.   Dep and anxious moderate.  09/01/22 appt noted: Meds clonazepam  0.5 mg BID prn, fluvoxamine  200 mg daily, lithium  900 HS, latuda  120 mg daily, vit D3 This type of dep since November after mania is low energy, anhedonia, low appetite, a lot of time in bed, textbook depresssion, lack of interest but not debilitating sadness.   Tired of it.  Dep is worse than anxiety  and used to be the other way around.  Not as irritable. In past dep was anxious and disconnected and wasn't as tired.  Had to leave work bc can't get himself out of bed.   Toss and turn in bed fidgety in bed, feels like has to move but no energy to walk.  Fidgety for 3 mos. Clonazepam  takes the edge off but feels a higher dose is needed.  Plan: Changes: Reduce Latuda  to 60 mg daily. Start samples of Auvelity  1 in the AM for dep that is  TRD  10/20/22 appt noted: Meds clonazepam  0.5 mg BID prn, fluvoxamine  200 mg daily, lithium  900 HS, latuda  60 mg daily, vit D3, out of Auvelity  after 2 weeks. Less restless but still has it 6/10 after reduced Latuda .  Thinks he had it before eLatuda but not sure. Takes an hour to get to sleep.   No change in dep from this visit to last visit. Did have positive effects from Auvelity  but ? SE conc.  Did help energy Lately more dep than anxiety.  Noted ASA helps anxiety. Couple times situational anger but no mood swings. Plan: Changes: Reduce fluvoxamine  to 1 and 1/2 tablet daily to see if restlessness better Reduce Latuda  from 60 to 40 mg daily to see if restlessness better Retry Auvelity  1 in the AM for 7 days then 1 twice daily  11/14/22 appt noted: Twitching and restlessness better.   Still some teeth grinding but better Auvelity  didn't help mood.  Ongoing classic lethargy, poor appetite.  Can't exercise bc depression.  Little tasks overwhelming.    clonazepam  0.5 mg BID prn, fluvoxamine  150 mg daily, lithium  900 HS, latuda  40 mg daily, vit D3,  Auvelity  BID SE constipation and bruxism. Sleep fair and best and most relaxed 730-930 AM.  To bed 10 pm.   12/26/22 appt noted: More relaxed if not underpressure but not working and needs to. Not much motivation or energy in AM.  Doesn't notice the effects of Seroquel  for many hours, but it is relaxing med. For last 7 years hard time with motivation and capacity.   Happened years ago and then it resolved after 7 years with Wellbutrin  Plan: Changes none:  Continue 1/2 quetiapine  XR 150 mg PM.  This is helping him to feel more calm in the morning than he has felt in a very long time.  However he thinks now he is a little too subdued but does not want to do reduce the dose any further.  Does not tolerate a higher dose because it is too sedating.  01/09/23 TC:  Patient taking 75 mg of Seroquel  and sleeping great at night, but also sleeping a lot  during the day. He reports he doesn't want to be around people, is content to be at home not doing anything. He rates depression as 7/10, anxiety 5/10. Reports anxiety is better since starting Seroquel . He also takes: Fluvoxamine  150 mg at bedtime Lithium  900 mg at bedtime, though 1200 is prescribed. No longer taking clonazepam .    MD resp:  There are a limited number of options left to us  and it is important that each med be tried at his most effective dose.  Seroquel  is FDA approved for depression in doses of 150 mg and higher.  The low dose he is currently on is enough to help anxiety and his sleep but probably not enough to help depression.  We may need to go as high as 200 or 300 mg a day.  He is probably fearful of being too sedated at higher doses but this medicine does not necessarily make people more sleepy at higher doses.  I think she got to go up to the full quetiapine  XR 150 mg daily and give that a try until his appointment with me.      02/08/23 appt noted: Psych med: no Clonazepam  0.5 mg twice daily as needed anxiety, fluvoxamine  150 nightly, lithium  900 nightly, quetiapine  XR 150 at 3 pm Less am drowsiness taking Seroquel  in the afternoon. Chronic fatigue unchanged.  Rare days without it.  Can do one task daily.  Can work 1 and 1/2 hours daily.  Naps about 2 pm.   Seroquel  helps sleep and anxiety.   Easily overwhelmed if multiple tasks.   Feels much better in the AM than in years.  By noon is worn out.   Plan: Increase for TRD quetiapine  XR 200 mg PM.   03/23/23 appt noted: Meds as above except incr Seroquel  XR 200 mg daily. Anxiety better but still some dep with less motivation maybe 20% better. No sig SE except dryness. URI for 8 days. Sleep 10 hours per usual.   Much more relaxed with Seroquel . Plan: Increase further for TRD quetiapine  XR 300 mg PM.   05/02/23 appt noted: Multiple phone calls since here complaining of ongoing sx and med rxns. Incr and decrease  fluvoxamine  to 150 now 100 complaining of no benefit for anxiety at the higher dose and feeling somewhat agitated.   Current meds:.  Fluvoxamine  100 nightly, lithium  900 nightly, quetiapine  ER 300 mg nightly. Felt annoyed, angry , agitated without reason and called.   Is a little better the last 10 days.  Not nearly as angry SE wt gain and constipation. Asks about switch to paxil  bc helped the most of anything.   Speaking to counselor.  Fearful childhood. Plan: Increase quetiapine  ER  to  200 mg 2 tablets in evening for 3 weeks then 3 tablets in the evening.   06/15/23 appt noted: Med: Seroquel  XR 600, lithium  900, fluvoxamine  150. Vit D 5000 helped Overall is a little better.  Less foggy.  Less dep. 30-40% better with combo. SE gained another 5# Vit D helped fatigue.  Still naps but less. Sleep is OK. Less angry but still has shortness.  But not as bad. Plan continue above longer  07/27/23 appt noted: Med as above 50-60% of himself.  Mind wears him out and then has to take a nap.  It's been a lot worse than this before but would like change meds to get better back to paroxetine .   No changes with increase Seroquel . Willing to come back to this if sx are not better with paroxetine .   Physical weakness is rough.  But doesn't work so doesn't have to do much .  Last year could work 4 hours daily but not now.  Get overwhelmed really quickly with little matters.  Gets irritable. Plan: Plan: Reduce fluvoxamine  to 1/2 tablet and add 1/2 paroxetine  for 5 days, Then stop fluvoxamine  and increase paroxetine  to 1 tablet daily.    08/16/23 TC:  Pt reported he was having bad leg cramps from his hips to his feet. He said he googled and read that taking Seroquel  and Paxil  could give him too much dopamine. He decreased Seroquel  from 600 mg to 300 mg and reports after 2 days the cramps went away, he is more relaxed, less agitated, and is sleeping well. He is taking: Paxil  30  Lithium  900 Seroquel  300  He  doesn't need/isn't asking for anything, just wanted to make you aware.        6+/18/25 appt noted:  Med:  Seroquel  XR 300 pm, paroxetine  30, no fluvoxamine  , lithium  900 HS.  Helped leg cramps with less Seroquel  XR and better mental clarity.   Paxil  helped anxiety tremendously.   Still dealing with depression.  Exhausted by 1 pm and naps.   Sleep regular hours but not restful.  Moderate helpful.  9 hours.  CPAP. SE no sig other than constipation and hard to lose wt. But not gaining. Not enough fluids and gets lightheaded.   Plan: Plan: Reduce Seroquel  XR to 200  09/15/23 TC:   10/18/23 appt noted:  Med:  Pt lvm 6/26 @ 8:00 pm. Pt stated he decreased to 200 mg Seroquel  last week and 6/25 had mild mania. Gone now. Should he stay with 200 mg or increase back to 300 mg.    resp:  Patient reporting he did have one day of mania on reduced dose of Seroquel  and he went back up to 300 mg and he is doing better.     09/21/23 TC:  Pt had called 6/27 and left message that he had reduced Seroquel  to 200 mg. He reported an episode of mania and went back to 300 and was doing better.  Today he is reporting he feels like he is going to lose his marbles. I asked for examples and he said he didn't feel stable. He said he had a lot of 200 mg tablets and asked if he could go back to 400 mg, or if you had another suggestion. He said he had to decrease dose because his legs locked up. When asked he has no active SI, but said he wish he wasn't here. He did say that he was going to be really busy until 1:00 today and to leave him a message if I called and he didn't answer.     MD resp:  Take 300-400 mg HS of Seroquel  nightly.  Stay hydrated and take 1 magnesium tablet daily to prevent cramps.  We can discuss other options at his next appt if needed    Lorene Macintosh, MD, [facility]  10/18/23 appt noted:  Med:  Seroquel  XR 400 pm, paroxetine  30,  lithium  900 HS.  Better with 400 mg vs lower, resolved hypomania with  buying.  Not wasteful buying.   2 brothers on Seroquel .  One of them on disability with OCD but passed away. Paxil  very helpful.  More at peace. Get some tiredness in afternoon and has to nap 90 mins.  Done this for years. Normal sleep at night, but maybe not as refreshed, like something is on my mind for the last 6 mos.  Feels like he is half awake at night.  It is bearable.  But asks about Ambien. Mood and anxiety better than a long time.   Plan: continue Seroquel  XR 400, paroxetine  30, lithium  900.  No changes today But check lithium  level.  12/06/23 appt Med:  Seroquel  XR 400 pm, paroxetine  30,  lithium  900 HS.  Not going that good.  Mind running and tired and some mild mania.  Reduced conc, some dep.   Exhausted and has to nap in afternoon after 8 hours at night.   Anxiety is pretty high with rumination.  Helps to talk to people.   Knows we tried a variety of other meds.  Asks maybe to retry Vraylar .  Reviewed history Dwells on fear of how he feels.  If talks to someone about it it is relieved but then recurs after 3 days. Wonders if it is OCD.   Plan: retry Vraylar  1.5 mg capsule daily for 1 week then 3 mg daily for 3 weeks, then if no benefit then increase to 4.5 mg  (or 3 mg +1.5 mg ).  If worsening mania then increase faster.  12/26/23 TC:  Pt left vm saying he is having some intolerable leg jerking and pain.   He reported he had noticed toe tapping previously, but felt he could live with it. Reported the jerking was bad last night, he was unable to relax and had trouble sleeping. Reporting the jerking also in his arms, but worse in his legs. He has had these complaints previously and is relating them to Vraylar .  MD resp:  Stop Vraylar  and wait until SE resolve.  Then resume lower dose of 3 mg daily until appt.    Lorene Macintosh, MD, [facility]AP[facility]       02/14/24 appt noted:  Med:  Seroquel  XR 400 pm, paroxetine  30,  lithium  900 HS. , Vraylar  stopped but partly DT cost Reports  compulsive confession issues.  Will feel a lot better briefly with confession and has for the last month.  He remembers this back teen years.  His B had severe OCD.  Concerned over wt gain but usually it has stopped.  Dx MVR 30% but will monitor. No longer manic sx Overall sx mod dep sx and anxiety better .  But knows OCD will flare up again.   Plan:  incrase paroxetine  40 for OCD,  12/03/28/23 appt noted: Med: Seroquel  XR 400 pm, paroxetine  30,  lithium  900 HS. ,  Felt mor e confused after increasing paroxetine  to 40 mg and then reduced to 30 mg and took a week to get better.    Wants off Seroquel  though it hleps DT wt gain.   Still fatigued.  Still some persistent sx.  But not obsesing as much right now.  That was why I drank so much but is better right now. Paxil  helped it but concerned it might come back again. Started gaining wt with Paxil  before eSeroquel.   Asks Latuda , risperidone , olanzapine , Geodon.   Sleep 9-10 hours now.   Plan: Plan: paroxetine  30 for OCD, continue lithium  900.  Start Caplyta  1 nightly. Reduce Seroquel  XR 300 mg nightly for 2 weeks, Then cut it 1/2 of 300 mg nightly for 2 weeks and then stop it.  03/06/24 TC: He reports he didn't take Seroquel  XR 300 for 2 weeks, took for 1 week and then reduced to 150 mg and has been on 150 mg for 4 days. He is taking the Caplyta .  He reported SE of dizziness, blurred vision (he described floaters), he fell on one occasion, and he was having confusion. He reports that sx are improving, but is asking if he can stay on Seroquel  150 with the Caplyta . He has FU 1/7.  MD resP:  He can stay on Seroquel  150 with Caplyta  if that's what he wants to do, until hi s next appt with me.    Lorene Macintosh, MD, [facility]   03/27/24 appt noted: Med: reduced Seroquel  XR 150 pm, paroxetine  30,  lithium  900 HS. Caplyta  42 mg AM,  SE dizzy and disconnected but better than it was Sleep is ok.  At first sleeping until noon and better now.   Interest is  not  that great.  Nor motivation.  Minimal enjoyment in decades but is some better than decades ago.   Always side tracked thinking, obessing on his condition.   Anxiety is not bad. Energy no different from last visit.   Lost 10# since here.    On disability for 7-8 years.  Working PT Before treatment did not sleep and RX Ambien and Wellbutrin  [facility] for alcohol.  Sober 2 year 01/2019  Past psych meds:   Paxil  60 too much + wt gain,   duloxetine, Zoloft,  Viibryd 60 partial resp (trial after paroxetine ) SE diarrhea. Trintellix 10 agitated Wellbutrin  75 (max tolerated),   Fluvoxamine  300 no better than 200 which seems to take edge off anxiety Clomipramine  mania Auvelity  4 weeks NR  Abilify, Rexulti 2. Vraylar  irritable at 3 mg daily. Retry 4.5  mg akathisia Latuda  120 fidgety Olanzapine  10 fatigue Risperdal  2 once Seroquel  XR 600 benefit partial; 200 daily lost response  Buspar 30 BID stopped DT partial effect.  Xanax craving,   lithium   900, worse when he stopped lithium  Lamotrigine  NR CO dizzy  NAC 600 helped Modafinil  200 agitated Took lorazepam  in the past, Xanax less helpful Ambien worked.    B severe OCD obsessions psych tx.   Died OD pain pills and Seroquel  Other B Seroquel  for anxiety.  Review of Systems:  Review of Systems  Constitutional:  Positive for fatigue. Negative for unexpected weight change.  Cardiovascular:  Negative for palpitations.  Gastrointestinal:  Positive for constipation. Negative for nausea.  Neurological:  Negative for tremors.  Psychiatric/Behavioral:  Positive for dysphoric mood. Negative for agitation, behavioral problems, confusion, decreased concentration, hallucinations, self-injury, sleep disturbance and suicidal ideas. The patient is nervous/anxious. The patient is not hyperactive.     Medications: I have reviewed the patient's current medications.  Current Outpatient Medications  Medication Sig Dispense Refill    QUEtiapine  (SEROQUEL ) 50 MG tablet Take 1 tablet (50 mg total) by mouth at bedtime. 30 tablet 1   amLODipine  (NORVASC ) 5 MG tablet Take 1 tablet (5 mg total) by mouth daily. 90 tablet 3   atorvastatin  (LIPITOR) 80 MG tablet Take 1 tablet (80 mg total) by mouth daily. 90 tablet 3   Cholecalciferol (VITAMIN D3) 125 MCG (5000 UT) CAPS Take 5,000 Units by mouth daily in the afternoon.     lithium  carbonate 300 MG capsule Take 3 capsules (900 mg total) by mouth at bedtime. 270 capsule 0   lumateperone  tosylate (CAPLYTA ) 42 MG capsule Take 1 capsule (42 mg total) by mouth daily. 30 capsule 1   NON FORMULARY Pt uses a c-pap nightly     PARoxetine  (PAXIL ) 30 MG tablet Take 1 tablet (30 mg total) by mouth daily. 90 tablet 0   No current facility-administered medications for this visit.    Medication Side Effects: sexual, weight  Allergies: No Known Allergies  Past Medical History:  Diagnosis Date   Alcohol abuse    Anxiety    Bimalleolar fracture of left ankle    Depression    Heart murmur    HLD (hyperlipidemia)    HTN (hypertension)    MVP (mitral valve prolapse)    moderate MVP of the middle scallop of the posterior MVL with moderate to severe MR echo 11/2023   Sleep apnea    uses CPAP nightly   Tobacco use    Vitamin D  deficiency     Family History  Problem Relation Age of Onset   Cancer Maternal Grandfather  Colon cancer Neg Hx    Colon polyps Neg Hx    Esophageal cancer Neg Hx    Rectal cancer Neg Hx    Stomach cancer Neg Hx     Social History   Socioeconomic History   Marital status: Single    Spouse name: Not on file   Number of children: Not on file   Years of education: Not on file   Highest education level: Associate degree: occupational, scientist, product/process development, or vocational program  Occupational History   Not on file  Tobacco Use   Smoking status: Former    Current packs/day: 0.00    Average packs/day: 1.5 packs/day for 48.0 years (72.0 ttl pk-yrs)    Types:  Cigarettes    Start date: 03/22/1975    Quit date: 2025    Years since quitting: 1.0    Passive exposure: Past   Smokeless tobacco: Never   Tobacco comments:    Quit 1.5 ago  Vaping Use   Vaping status: Never Used  Substance and Sexual Activity   Alcohol use: Not Currently   Drug use: No   Sexual activity: Not on file  Other Topics Concern   Not on file  Social History Narrative   Not on file   Social Drivers of Health   Tobacco Use: Medium Risk (03/27/2024)   Patient History    Smoking Tobacco Use: Former    Smokeless Tobacco Use: Never    Passive Exposure: Past  Physicist, Medical Strain: Low Risk (02/18/2024)   Overall Financial Resource Strain (CARDIA)    Difficulty of Paying Living Expenses: Not very hard  Food Insecurity: No Food Insecurity (02/19/2024)   Epic    Worried About Radiation Protection Practitioner of Food in the Last Year: Never true    Ran Out of Food in the Last Year: Never true  Transportation Needs: No Transportation Needs (02/19/2024)   Epic    Lack of Transportation (Medical): No    Lack of Transportation (Non-Medical): No  Physical Activity: Inactive (02/19/2024)   Exercise Vital Sign    Days of Exercise per Week: 0 days    Minutes of Exercise per Session: 0 min  Stress: No Stress Concern Present (02/19/2024)   [employer] of Occupational Health - Occupational Stress Questionnaire    Feeling of Stress: Only a little  Social Connections: Socially Isolated (02/19/2024)   Social Connection and Isolation Panel    Frequency of Communication with Friends and Family: More than three times a week    Frequency of Social Gatherings with Friends and Family: Three times a week    Attends Religious Services: Never    Active Member of Clubs or Organizations: No    Attends Banker Meetings: Never    Marital Status: Never married  Intimate Partner Violence: Not At Risk (02/19/2024)   Epic    Fear of Current or Ex-Partner: No    Emotionally Abused: No    Physically  Abused: No    Sexually Abused: No  Depression (PHQ2-9): Medium Risk (02/19/2024)   Depression (PHQ2-9)    PHQ-2 Score: 6  Alcohol Screen: Low Risk (02/14/2023)   Alcohol Screen    Last Alcohol Screening Score (AUDIT): 0  Housing: Low Risk (02/19/2024)   Epic    Unable to Pay for Housing in the Last Year: No    Number of Times Moved in the Last Year: 0    Homeless in the Last Year: No  Utilities: Not At Risk (02/19/2024)   Epic  Threatened with loss of utilities: No  Health Literacy: Adequate Health Literacy (02/19/2024)   B1300 Health Literacy    Frequency of need for help with medical instructions: Never    Past Medical History, Surgical history, Social history, and Family history were reviewed and updated as appropriate.   Please see review of systems for further details on the patient's review from today.   Objective:   Physical Exam:  There were no vitals taken for this visit.  Physical Exam Constitutional:      General: He is not in acute distress.    Appearance: Normal appearance. He is well-developed.     Comments: Red face  Musculoskeletal:        General: No deformity.  Neurological:     Mental Status: He is alert and oriented to person, place, and time.     Motor: No tremor.     Coordination: Coordination normal.     Gait: Gait normal.  Psychiatric:        Attention and Perception: Attention normal. He is attentive.        Mood and Affect: Mood is anxious and depressed. Affect is not labile, blunt, angry or inappropriate.        Speech: Speech normal.        Behavior: Behavior normal. Behavior is not agitated or slowed.        Thought Content: Thought content normal. Thought content is not delusional. Thought content does not include homicidal or suicidal ideation. Thought content does not include suicidal plan.        Cognition and Memory: Cognition normal.        Judgment: Judgment normal.     Comments: Insight  Fair. Continued mood problems  Less  obsessive right now for unclear reasons but not gone       Lab Review:     Component Value Date/Time   NA 140 01/25/2024 0906   NA 139 01/18/2024 1501   K 4.1 01/25/2024 0906   CL 104 01/18/2024 1501   CO2 21 01/18/2024 1501   GLUCOSE 88 01/18/2024 1501   GLUCOSE 106 (H) 05/26/2023 1113   BUN 18 01/18/2024 1501   CREATININE 1.12 01/18/2024 1501   CREATININE 1.01 03/01/2022 0904   CALCIUM  9.4 01/18/2024 1501   PROT 7.0 05/26/2023 1113   ALBUMIN 4.4 05/26/2023 1113   AST 18 05/26/2023 1113   ALT 39 05/26/2023 1113   ALKPHOS 97 05/26/2023 1113   BILITOT 0.4 05/26/2023 1113       Component Value Date/Time   WBC 9.2 01/18/2024 1501   WBC 6.4 05/26/2023 1113   RBC 5.10 01/18/2024 1501   RBC 4.81 05/26/2023 1113   HGB 12.9 (L) 01/25/2024 0906   HGB 15.9 01/18/2024 1501   HGB 15.2 05/05/2010 1049   HCT 38.0 (L) 01/25/2024 0906   HCT 46.2 01/18/2024 1501   HCT 44.8 05/05/2010 1049   PLT 228 01/18/2024 1501   MCV 91 01/18/2024 1501   MCV 89.6 05/05/2010 1049   MCH 31.2 01/18/2024 1501   MCH 30.4 05/05/2010 1049   MCHC 34.4 01/18/2024 1501   MCHC 34.7 05/26/2023 1113   RDW 11.9 01/18/2024 1501   RDW 13.1 05/05/2010 1049   LYMPHSABS 2.5 07/21/2022 1143   LYMPHSABS 1.8 05/05/2010 1049   MONOABS 0.8 07/21/2022 1143   MONOABS 0.4 05/05/2010 1049   EOSABS 0.6 07/21/2022 1143   EOSABS 0.2 05/05/2010 1049   BASOSABS 0.1 07/21/2022 1143   BASOSABS 0.0 05/05/2010 1049  Lithium  Lvl  Date Value Ref Range Status  12/01/2023 0.8 0.6 - 1.2 mmol/L Final  02/22/23 lithium  0.7 on 900 mg daily. 04/01/22 lithium  level 0.9 on 1200 mg daily  02/02/21 lithium  level 0.6 on 900 mg daily  05/2023 vit D 30 started 5000 units helping fatigue  No results found for: PHENYTOIN, PHENOBARB, VALPROATE, CBMZ   .res Assessment: Plan:     Helaman was seen today for follow-up, depression, anxiety and medication reaction.  Diagnoses and all orders for this visit:  Bipolar II disorder  (HCC) -     QUEtiapine  (SEROQUEL ) 50 MG tablet; Take 1 tablet (50 mg total) by mouth at bedtime. -     lumateperone  tosylate (CAPLYTA ) 42 MG capsule; Take 1 capsule (42 mg total) by mouth daily. -     lithium  carbonate 300 MG capsule; Take 3 capsules (900 mg total) by mouth at bedtime.  Mixed obsessional thoughts and acts  GAD (generalized anxiety disorder)  OSA (obstructive sleep apnea)  Lithium  use  Low vitamin D  level  Low serum vitamin B12  Alcohol dependence in remission (HCC)    Chronic recurrent rumination with anxiety and urgency and sense of need and instablity with impatience ongoing with frequent phone calls between appts. but  but anhedonic depression and anxious.  Needs a lot of reassurance. Chronic worry and overwhelmed easily and misattributes sx as SE of meds leading to inadequate med trials.  Fear of meds.Falsely attributes his psych sx to the meds and then stops meds AMA.  False attribution of sx as SE.  But overall this is less of a problem than it was.  he describe history other manic cycling symptoms including increased interest, increased goal-directed behaviors, increased urge to spend that will last for 6 weeks or so and then stop.  He has these cycles about 3 times a year.  This is suggestive of a bipolar predisposition which was confirmed by recent cycle into mania from clomipramine .  Explained bipolar disorder underlying as reason never responded consistently to AD alone.  Continue CPAP use. New CPAP machine.  Discussed potential metabolic side effects associated with atypical antipsychotics, as well as potential risk for movement side effects. Advised pt to contact office if movement side effects occur.  He agrees.  Call if you have any problems with this.  He remains sober.    Bc late 2023 mania with clomipramine  increased lithium  1200  mg daily .   He cut it back to 900 mg daily.  He admits to feeling worse when he stopped the lithium .  Call if sx get  worse. Counseled patient regarding potential benefits, risks, and side effects of lithium  to include potential risk of lithium  affecting thyroid  and renal function.  Discussed need for periodic lab monitoring to determine drug level and to assess for potential adverse effects.  Counseled patient regarding signs and symptoms of lithium  toxicity and advised that they notify office immediately or seek urgent medical attention if experiencing these signs and symptoms.  Patient advised to contact office with any questions or concerns. 02/02/21  02/02/21 lithium  level 0.6 on 900 mg daily, normal B12 and vitamin D  52 04/01/22 lithium  level 0.9 on 1200 mg daily  02/02/21 lithium  level 0.6 on 900 mg daily 02/22/23 lithium  0.7 on 900 mg daily. 12/01/23 0.8 on 900 mg daily.  Disc the off-label use of N-Acetylcysteine at 600 mg daily to help with mild cognitive problems.  It can be combined with a B-complex vitamin as the B-12 and  folate have been shown to sometimes enhance the effect.  Partial benefit so continue NAC 1200 mg daily.  Continue vitamin D  DT history level 30  Constipation management 1.  Lots of water  2.  Powdered fiber supplement such as MiraLAX, Citrucel, etc. preferably with a meal 3.  2 stool softeners a day 4.  Milk of magnesia or magnesium tablets if needed  Plan: paroxetine  30 for OCD, continue lithium  900.  Continue Caplyta  1 nightly. Reduce Seroquel  50 mg nightly for 2 weeks and consider stoppit it.  Talk with insurance rep about Caplyta , option retry risperidone , Geodon, Latuda ,   Try to stay as active as possible.  Encourage [facility] support group.  Disc therapy for compulsive confession.  Please see After Visit Summary for patient specific instructions.  FU 4 weeks  Lorene Macintosh, MD, DFAPA     Future Appointments  Date Time Provider Department Center  05/08/2024 11:00 AM [facility], Lorene KANDICE Raddle., MD [facility] None  02/24/2025 10:40 AM [facility]-ANNUAL WELLNESS VISIT [facility] [address]    No orders of the defined types were placed in this encounter.       -------------------------------

## 2024-04-15 ENCOUNTER — Telehealth: Payer: Self-pay | Admitting: Psychiatry

## 2024-04-15 NOTE — Telephone Encounter (Signed)
 LVM to RC. Called before the second message was added.

## 2024-04-15 NOTE — Telephone Encounter (Signed)
*  Disregard prev msg*   Pt lvm stating the caplyta  and seroquel  combination is making him short, confused and snappy. He does not want to continue taking both. States he has been taking both since the 17th.

## 2024-04-15 NOTE — Telephone Encounter (Signed)
 Pt lvm stating he is out of vraylar  samples and needs more

## 2024-04-15 NOTE — Telephone Encounter (Signed)
 Pt reports being short/snappy with people and confused. He reports being on Caplyta  and Seroquel  since 1/17. He reports that the goal of Caplyta  is to get him off Seroquel  eventually. He would like to go head and stop the Seroquel .

## 2024-04-16 NOTE — Telephone Encounter (Signed)
LVM with recommendations  

## 2024-04-16 NOTE — Telephone Encounter (Signed)
 Go ahead and stop Seroquel .  Continue Caplyta  longer.

## 2024-04-19 ENCOUNTER — Telehealth: Payer: Self-pay | Admitting: Psychiatry

## 2024-04-19 NOTE — Telephone Encounter (Signed)
 Pt LVM @ 9:13a.  He said he is doing better since he stopped the Seroquel , but he can't get to sleep.  He was up to 4am.  Pls call him back to discuss.  Next appt 2/18

## 2024-04-19 NOTE — Telephone Encounter (Signed)
 Pt has been off Seroquel  for 3 days. He reports the last 2 nights he is up until around 4 AM and when he gets to sleep he is only sleeping 3-4 hours. He said he is tired during the day from depression, but not napping during the day. He wanted to get off Seroquel  due to reported wt gain of 55#.

## 2024-04-22 ENCOUNTER — Encounter: Payer: Self-pay | Admitting: Gastroenterology

## 2024-04-22 ENCOUNTER — Encounter: Payer: Self-pay | Admitting: Internal Medicine

## 2024-04-22 NOTE — Telephone Encounter (Signed)
 We need to taper the Seroquel  slower and for longer.  He may have some 50 mg tablets left.  Resume those for sleep until his next appt with me.

## 2024-04-22 NOTE — Telephone Encounter (Signed)
 LVM per DPR with recommendations to resume Seroquel  at 50 mg until next appt.

## 2024-04-25 NOTE — Telephone Encounter (Signed)
 He's been taking Paxil  8 months with no dosage change.  There's no evidence to support it is making any sx worse.  After starting it last year he came to his appt in June and said it helped anxiety tremendously.  Furthermore you can't stop Paxil  abruptly and I don't want to change it between appts.  He is a complex patient and tried many other meds.   If he thinks Caplyta  is making him worse then stop it.   But we don't have many other options and I suggest he leave it alone also until the appt.

## 2024-04-25 NOTE — Telephone Encounter (Signed)
 Pt lvm today stating that he only has 4 caplyta  lfet. He said that he can't due the paxil  and captlya together. It makes his anxiety and depression worse. He thinks it is the paxil . He doesn't know if he can afford the captlyta because it is 800 dollars. Please call him at [phone]

## 2024-04-25 NOTE — Telephone Encounter (Signed)
 Reviewed recommendations with patient. He advised he would continue medications as prescribed until next appt.

## 2024-04-25 NOTE — Telephone Encounter (Signed)
 Pt reporting anxiety and depression are worse. He said he thinks is is the Paxil  not working, but said he is no better with Caplyta . He said when he started Paxil  is when the weight gain started. He was relating it to the Seroquel  and reports losing 10 lbs since stopping the Seroquel . Anxiety today 3/10, yesterday was 10/10. Depression 2/10. No PA. He has anger and frustration. He is sleeping ok.  He said he wants to work and can't. Reports sleeping ok. He has FU 2/18.

## 2024-05-08 ENCOUNTER — Ambulatory Visit: Admitting: Psychiatry

## 2024-06-19 ENCOUNTER — Ambulatory Visit: Admitting: Psychiatry

## 2025-02-24 ENCOUNTER — Ambulatory Visit
